# Patient Record
Sex: Female | Born: 1945
Health system: Southern US, Community
[De-identification: ages and names within clinical notes are randomized; demographics above are authoritative.]

## PROBLEM LIST (undated history)

## (undated) DIAGNOSIS — K635 Polyp of colon: Secondary | ICD-10-CM

## (undated) DIAGNOSIS — L0390 Cellulitis, unspecified: Secondary | ICD-10-CM

## (undated) DIAGNOSIS — E785 Hyperlipidemia, unspecified: Secondary | ICD-10-CM

## (undated) DIAGNOSIS — E079 Disorder of thyroid, unspecified: Secondary | ICD-10-CM

## (undated) DIAGNOSIS — E119 Type 2 diabetes mellitus without complications: Secondary | ICD-10-CM

## (undated) DIAGNOSIS — C801 Malignant (primary) neoplasm, unspecified: Secondary | ICD-10-CM

## (undated) DIAGNOSIS — J45909 Unspecified asthma, uncomplicated: Secondary | ICD-10-CM

## (undated) HISTORY — DX: Polyp of colon: K63.5

## (undated) HISTORY — DX: Cellulitis, unspecified: L03.90

## (undated) HISTORY — PX: TOOTH EXTRACTION: SUR596

## (undated) HISTORY — DX: Hyperlipidemia, unspecified: E78.5

## (undated) HISTORY — DX: Morbid (severe) obesity due to excess calories: E66.01

---

## 1988-10-21 DIAGNOSIS — E079 Disorder of thyroid, unspecified: Secondary | ICD-10-CM

## 1988-10-21 HISTORY — DX: Disorder of thyroid, unspecified: E07.9

## 1998-02-20 HISTORY — PX: FRACTURE SURGERY: SHX138

## 2011-02-03 ENCOUNTER — Ambulatory Visit (INDEPENDENT_AMBULATORY_CARE_PROVIDER_SITE_OTHER): Payer: 59

## 2011-02-03 DIAGNOSIS — R05 Cough: Secondary | ICD-10-CM

## 2011-08-27 ENCOUNTER — Ambulatory Visit (INDEPENDENT_AMBULATORY_CARE_PROVIDER_SITE_OTHER): Payer: Medicare Other | Admitting: Emergency Medicine

## 2011-08-27 VITALS — BP 128/76 | HR 94 | Temp 98.7°F | Resp 17 | Ht 64.0 in | Wt 198.0 lb

## 2011-08-27 DIAGNOSIS — J218 Acute bronchiolitis due to other specified organisms: Secondary | ICD-10-CM | POA: Diagnosis not present

## 2011-08-27 DIAGNOSIS — J45901 Unspecified asthma with (acute) exacerbation: Secondary | ICD-10-CM

## 2011-08-27 DIAGNOSIS — J018 Other acute sinusitis: Secondary | ICD-10-CM | POA: Diagnosis not present

## 2011-08-27 MED ORDER — PSEUDOEPHEDRINE-GUAIFENESIN ER 60-600 MG PO TB12: 1.0000 | ORAL_TABLET | Freq: Two times a day (BID) | ORAL | Status: DC

## 2011-08-27 MED ORDER — ALBUTEROL SULFATE HFA 108 (90 BASE) MCG/ACT IN AERS: 2.0000 | INHALATION_SPRAY | RESPIRATORY_TRACT | Status: DC | PRN

## 2011-08-27 MED ORDER — LEVOFLOXACIN 500 MG PO TABS: 500.0000 mg | ORAL_TABLET | Freq: Every day | ORAL | Status: AC

## 2011-08-27 NOTE — Progress Notes (Signed)
  Subjective:    Patient ID: Jessica Farrell, female    DOB: 12-Dec-1945, 66 y.o.   MRN: [id number]  Cough This is a new problem. The current episode started in the past 7 days. The problem has been gradually worsening. The cough is productive of purulent sputum. Associated symptoms include chills, a fever, headaches, myalgias, nasal congestion, postnasal drip, a sore throat and wheezing. Pertinent negatives include no chest pain, ear congestion, ear pain, eye redness, heartburn, hemoptysis, rash, rhinorrhea, shortness of breath, sweats or weight loss. Nothing aggravates the symptoms. She has tried a beta-agonist inhaler for the symptoms. The treatment provided no relief. Her past medical history is significant for asthma. There is no history of bronchiectasis, bronchitis, COPD, emphysema, environmental allergies or pneumonia.  Headache  This is a new problem. The current episode started in the past 7 days. The problem occurs constantly. The problem has been unchanged. The pain is mild. Associated symptoms include coughing, drainage, a fever, sinus pressure, a sore throat and weakness. Pertinent negatives include no abdominal pain, abnormal behavior, anorexia, back pain, blurred vision, dizziness, ear pain, eye pain, eye redness, eye watering, facial sweating, hearing loss, insomnia, loss of balance, muscle aches, nausea, neck pain, numbness, phonophobia, photophobia, rhinorrhea, scalp tenderness, seizures, swollen glands, tingling, tinnitus, visual change, vomiting or weight loss.      Review of Systems  Constitutional: Positive for fever and chills. Negative for weight loss.  HENT: Positive for sore throat, postnasal drip and sinus pressure. Negative for hearing loss, ear pain, rhinorrhea, neck pain and tinnitus.   Eyes: Negative for blurred vision, photophobia, pain and redness.  Respiratory: Positive for cough and wheezing. Negative for hemoptysis and shortness of breath.   Cardiovascular:  Negative for chest pain.  Gastrointestinal: Negative for heartburn, nausea, vomiting, abdominal pain and anorexia.  Genitourinary: Negative.   Musculoskeletal: Positive for myalgias. Negative for back pain.  Skin: Negative for rash.  Neurological: Positive for weakness and headaches. Negative for dizziness, tingling, seizures, numbness and loss of balance.  Hematological: Negative for environmental allergies.  Psychiatric/Behavioral: The patient does not have insomnia.        Objective:   Physical Exam  Constitutional: She is oriented to person, place, and time. She appears well-developed and well-nourished.  HENT:  Head: Normocephalic and atraumatic.  Eyes: Conjunctivae are normal. Pupils are equal, round, and reactive to light.  Neck: Normal range of motion. Neck supple.  Cardiovascular: Normal rate and regular rhythm.   Pulmonary/Chest: Effort normal and breath sounds normal.  Abdominal: Soft.  Musculoskeletal: Normal range of motion.  Neurological: She is oriented to person, place, and time.  Skin: Skin is warm and dry.          Assessment & Plan:  Exacerbation asthma Acute bronchitis Sinusitis Follow up as needed

## 2011-09-04 ENCOUNTER — Other Ambulatory Visit: Payer: Self-pay | Admitting: Emergency Medicine

## 2011-09-05 ENCOUNTER — Telehealth: Payer: Self-pay

## 2011-09-05 NOTE — Telephone Encounter (Signed)
Pt reports that she has improved some since she has taken the Abx, but still has nasal and chest congestion and cough w/yellow mucus/phlegm. Pt reports no fever and states she has a little more energy and cough is not quite as bad, but still using inhaler for wheeze as well. Pt requests another round of Abx to completely get rid of infection. If this needs to wait for Dr Ewell Poe review tomorrow, please let pt know.

## 2011-09-05 NOTE — Telephone Encounter (Signed)
10 d of abx should be enough - but if patient wants Dr Dareen Piano to review please send to him.

## 2011-09-05 NOTE — Telephone Encounter (Signed)
Requesting more antibiotic (needs another round)

## 2011-09-06 NOTE — Telephone Encounter (Signed)
The patient called to request second dose of antibiotic.  The patient is requesting call back asap. Please return call to patient at [phone].

## 2011-09-06 NOTE — Telephone Encounter (Signed)
Just ask patient to come in to be seen if she is that 'bad off'.  No on the antibiotic refill however.   Thanks

## 2011-09-06 NOTE — Telephone Encounter (Signed)
Can you please advise on this

## 2011-09-08 ENCOUNTER — Ambulatory Visit (INDEPENDENT_AMBULATORY_CARE_PROVIDER_SITE_OTHER): Payer: Medicare Other | Admitting: Family Medicine

## 2011-09-08 VITALS — BP 118/76 | HR 81 | Temp 98.5°F | Resp 16 | Ht 64.0 in | Wt 197.8 lb

## 2011-09-08 DIAGNOSIS — R05 Cough: Secondary | ICD-10-CM | POA: Diagnosis not present

## 2011-09-08 MED ORDER — AZITHROMYCIN 250 MG PO TABS: ORAL_TABLET | ORAL | Status: DC

## 2011-09-08 MED ORDER — AZITHROMYCIN 250 MG PO TABS: ORAL_TABLET | ORAL | Status: AC

## 2011-09-08 NOTE — Telephone Encounter (Signed)
The patient called again regarding need for antibiotic.  This writer advised patient that Dr. Dareen Piano stated she needed to be seen to be re-evaluated prior to writing any Rx's for current illness.  Patient stated she would attempt to come in to be seen.

## 2011-09-08 NOTE — Progress Notes (Signed)
[facility] [address] [place] [address] [phone]  Date:  09/08/2011   Name:  Jessica Farrell   DOB:  1945/03/05   MRN:  [id number]  PCP:  No primary provider on file.    Chief Complaint: Follow-up   History of Present Illness:  Jessica Farrell is a 66 y.o. very pleasant female patient who presents with the following:  She was here on 08/27/11 with a cough.  She took levaquin for 10 days.  However, she continues to have a cough an is coughing up some phlegm, and still has a stuffy nose.  She does feel better, but does not feel 100% better yet.  She has not had any fever, no chills or aches.  She did have a pain in her left shoulder blade a couple of days ago- she has been treating with ice and sports cream, and it has gotten better.  It seems MSK in origin- moving her shoulder/ arm induces the pain.   There is no problem list on file for this patient.   No past medical history on file.  No past surgical history on file.  History  Substance Use Topics  . Smoking status: Never Smoker   . Smokeless tobacco: Not on file  . Alcohol Use: Not on file    No family history on file.  Allergies  Allergen Reactions  . Penicillins Rash    Medication list has been reviewed and updated.  Current Outpatient Prescriptions on File Prior to Visit  Medication Sig Dispense Refill  . albuterol (PROVENTIL HFA;VENTOLIN HFA) 108 (90 BASE) MCG/ACT inhaler Inhale 2 puffs into the lungs every 4 (four) hours as needed for wheezing.  1 Inhaler  12  . montelukast (SINGULAIR) 10 MG tablet Take 10 mg by mouth at bedtime.      . pseudoephedrine-guaifenesin (MUCINEX D) 60-600 MG per tablet Take 1 tablet by mouth every 12 (twelve) hours.  18 tablet  0    Review of Systems:  As per HPI- otherwise negative.   Physical Examination: Filed Vitals:   09/08/11 1630  BP: 118/76  Pulse: 81  Temp: 98.5 F (36.9 C)  Resp: 16   Filed Vitals:   09/08/11 1630  Height: 5'  4" (1.626 m)  Weight: 197 lb 12.8 oz (89.721 kg)   Body mass index is 33.95 kg/(m^2). Ideal Body Weight: Weight in (lb) to have BMI = 25: 145.3   GEN: WDWN, NAD, Non-toxic, A & O x 3, overweight  HEENT: Atraumatic, Normocephalic. Neck supple. No masses, No LAD.  Tm and oropharynx wnl.   Ears and Nose: No external deformity. CV: RRR, No M/G/R. No JVD. No thrill. No extra heart sounds. PULM: CTA B,  crackles, rhonchi. No retractions. No resp. distress. No accessory muscle use.  Very minimal wheeze bilaterally ABD: S, NT, ND, +BS. No rebound. No HSM. EXTR: No c/c/e NEURO Normal gait.  PSYCH: Normally interactive. Conversant. Not depressed or anxious appearing.  Calm demeanor.  Mild tenderness along her left shoulder blade.  No redness or lesions.    Assessment and Plan: 1. Cough  azithromycin (ZITHROMAX) 250 MG tablet   Persistent cough after recent illness.  Reassured that cough can last for some time after treatment for bronchitis, and if she is feeling better she should likely just give herself more time to improve. Did give printed rx for a zpack which she can fill if she does not continue to get better.  If she is not  steadily getting better, or if she starts to get worse please call or RTC.   Abbe Amsterdam, MD

## 2011-11-14 ENCOUNTER — Other Ambulatory Visit: Payer: Self-pay | Admitting: Family Medicine

## 2012-04-20 DIAGNOSIS — L0390 Cellulitis, unspecified: Secondary | ICD-10-CM

## 2012-04-20 HISTORY — DX: Cellulitis, unspecified: L03.90

## 2012-05-05 ENCOUNTER — Emergency Department (HOSPITAL_BASED_OUTPATIENT_CLINIC_OR_DEPARTMENT_OTHER): Payer: Medicare Other

## 2012-05-05 ENCOUNTER — Encounter (HOSPITAL_BASED_OUTPATIENT_CLINIC_OR_DEPARTMENT_OTHER): Payer: Self-pay

## 2012-05-05 ENCOUNTER — Inpatient Hospital Stay (HOSPITAL_BASED_OUTPATIENT_CLINIC_OR_DEPARTMENT_OTHER)
Admission: EM | Admit: 2012-05-05 | Discharge: 2012-05-09 | DRG: 603 | Disposition: A | Discharge status: Home Health | Payer: Medicare Other | Attending: Internal Medicine | Admitting: Internal Medicine

## 2012-05-05 DIAGNOSIS — R739 Hyperglycemia, unspecified: Secondary | ICD-10-CM

## 2012-05-05 DIAGNOSIS — E871 Hypo-osmolality and hyponatremia: Secondary | ICD-10-CM | POA: Diagnosis present

## 2012-05-05 DIAGNOSIS — Z8249 Family history of ischemic heart disease and other diseases of the circulatory system: Secondary | ICD-10-CM

## 2012-05-05 DIAGNOSIS — Z9119 Patient's noncompliance with other medical treatment and regimen: Secondary | ICD-10-CM

## 2012-05-05 DIAGNOSIS — E119 Type 2 diabetes mellitus without complications: Secondary | ICD-10-CM

## 2012-05-05 DIAGNOSIS — E785 Hyperlipidemia, unspecified: Secondary | ICD-10-CM | POA: Diagnosis present

## 2012-05-05 DIAGNOSIS — L97309 Non-pressure chronic ulcer of unspecified ankle with unspecified severity: Secondary | ICD-10-CM | POA: Diagnosis present

## 2012-05-05 DIAGNOSIS — E860 Dehydration: Secondary | ICD-10-CM | POA: Diagnosis present

## 2012-05-05 DIAGNOSIS — L02419 Cutaneous abscess of limb, unspecified: Principal | ICD-10-CM | POA: Diagnosis present

## 2012-05-05 DIAGNOSIS — B354 Tinea corporis: Secondary | ICD-10-CM | POA: Diagnosis present

## 2012-05-05 DIAGNOSIS — K625 Hemorrhage of anus and rectum: Secondary | ICD-10-CM | POA: Diagnosis present

## 2012-05-05 DIAGNOSIS — E039 Hypothyroidism, unspecified: Secondary | ICD-10-CM | POA: Diagnosis present

## 2012-05-05 DIAGNOSIS — J45909 Unspecified asthma, uncomplicated: Secondary | ICD-10-CM | POA: Diagnosis present

## 2012-05-05 DIAGNOSIS — B353 Tinea pedis: Secondary | ICD-10-CM | POA: Diagnosis present

## 2012-05-05 DIAGNOSIS — N390 Urinary tract infection, site not specified: Secondary | ICD-10-CM | POA: Diagnosis present

## 2012-05-05 DIAGNOSIS — Z880 Allergy status to penicillin: Secondary | ICD-10-CM

## 2012-05-05 DIAGNOSIS — Z91199 Patient's noncompliance with other medical treatment and regimen due to unspecified reason: Secondary | ICD-10-CM

## 2012-05-05 DIAGNOSIS — L97409 Non-pressure chronic ulcer of unspecified heel and midfoot with unspecified severity: Secondary | ICD-10-CM | POA: Diagnosis present

## 2012-05-05 DIAGNOSIS — L03116 Cellulitis of left lower limb: Secondary | ICD-10-CM | POA: Diagnosis present

## 2012-05-05 DIAGNOSIS — Z23 Encounter for immunization: Secondary | ICD-10-CM

## 2012-05-05 DIAGNOSIS — N179 Acute kidney failure, unspecified: Secondary | ICD-10-CM | POA: Diagnosis present

## 2012-05-05 DIAGNOSIS — E1169 Type 2 diabetes mellitus with other specified complication: Secondary | ICD-10-CM | POA: Diagnosis present

## 2012-05-05 DIAGNOSIS — Z6831 Body mass index (BMI) 31.0-31.9, adult: Secondary | ICD-10-CM

## 2012-05-05 DIAGNOSIS — Z79899 Other long term (current) drug therapy: Secondary | ICD-10-CM

## 2012-05-05 DIAGNOSIS — L0390 Cellulitis, unspecified: Secondary | ICD-10-CM

## 2012-05-05 HISTORY — DX: Disorder of thyroid, unspecified: E07.9

## 2012-05-05 HISTORY — DX: Unspecified asthma, uncomplicated: J45.909

## 2012-05-05 HISTORY — DX: Type 2 diabetes mellitus without complications: E11.9

## 2012-05-05 LAB — URINE MICROSCOPIC-ADD ON

## 2012-05-05 LAB — CBC WITH DIFFERENTIAL/PLATELET
HCT: 39.1 % (ref 36.0–46.0)
Hemoglobin: 13.3 g/dL (ref 12.0–15.0)
Lymphocytes Relative: 8 % — ABNORMAL LOW (ref 12–46)
Lymphs Abs: 1.2 10*3/uL (ref 0.7–4.0)
Monocytes Absolute: 0.6 10*3/uL (ref 0.1–1.0)
Monocytes Relative: 4 % (ref 3–12)
Neutro Abs: 13.2 10*3/uL — ABNORMAL HIGH (ref 1.7–7.7)
WBC: 15 10*3/uL — ABNORMAL HIGH (ref 4.0–10.5)

## 2012-05-05 LAB — BASIC METABOLIC PANEL
BUN: 31 mg/dL — ABNORMAL HIGH (ref 6–23)
CO2: 24 mEq/L (ref 19–32)
Chloride: 91 mEq/L — ABNORMAL LOW (ref 96–112)
Creatinine, Ser: 1.2 mg/dL — ABNORMAL HIGH (ref 0.50–1.10)

## 2012-05-05 LAB — URINALYSIS, ROUTINE W REFLEX MICROSCOPIC
Bilirubin Urine: NEGATIVE
Glucose, UA: >1000 mg/dL — AB
Ketones, ur: NEGATIVE mg/dL
pH: 6 (ref 5.0–8.0)

## 2012-05-05 LAB — CREATININE, SERUM
Creatinine, Ser: 0.85 mg/dL (ref 0.50–1.10)
GFR calc non Af Amer: 70 mL/min — ABNORMAL LOW (ref >90)

## 2012-05-05 LAB — CBC
Hemoglobin: 11.5 g/dL — ABNORMAL LOW (ref 12.0–15.0)
MCH: 30.1 pg (ref 26.0–34.0)
Platelets: 169 10*3/uL (ref 150–400)
RBC: 3.82 MIL/uL — ABNORMAL LOW (ref 3.87–5.11)

## 2012-05-05 LAB — GLUCOSE, CAPILLARY
Glucose-Capillary: 222 mg/dL — ABNORMAL HIGH (ref 70–99)
Glucose-Capillary: 229 mg/dL — ABNORMAL HIGH (ref 70–99)

## 2012-05-05 IMAGING — CR DG CHEST 2V
2 series · 2 of 2 positions shown · non-contrast
Comparison: None.

CLINICAL DATA: Cough.  Short of breath.

CHEST - 2 VIEW

[w chest pa]
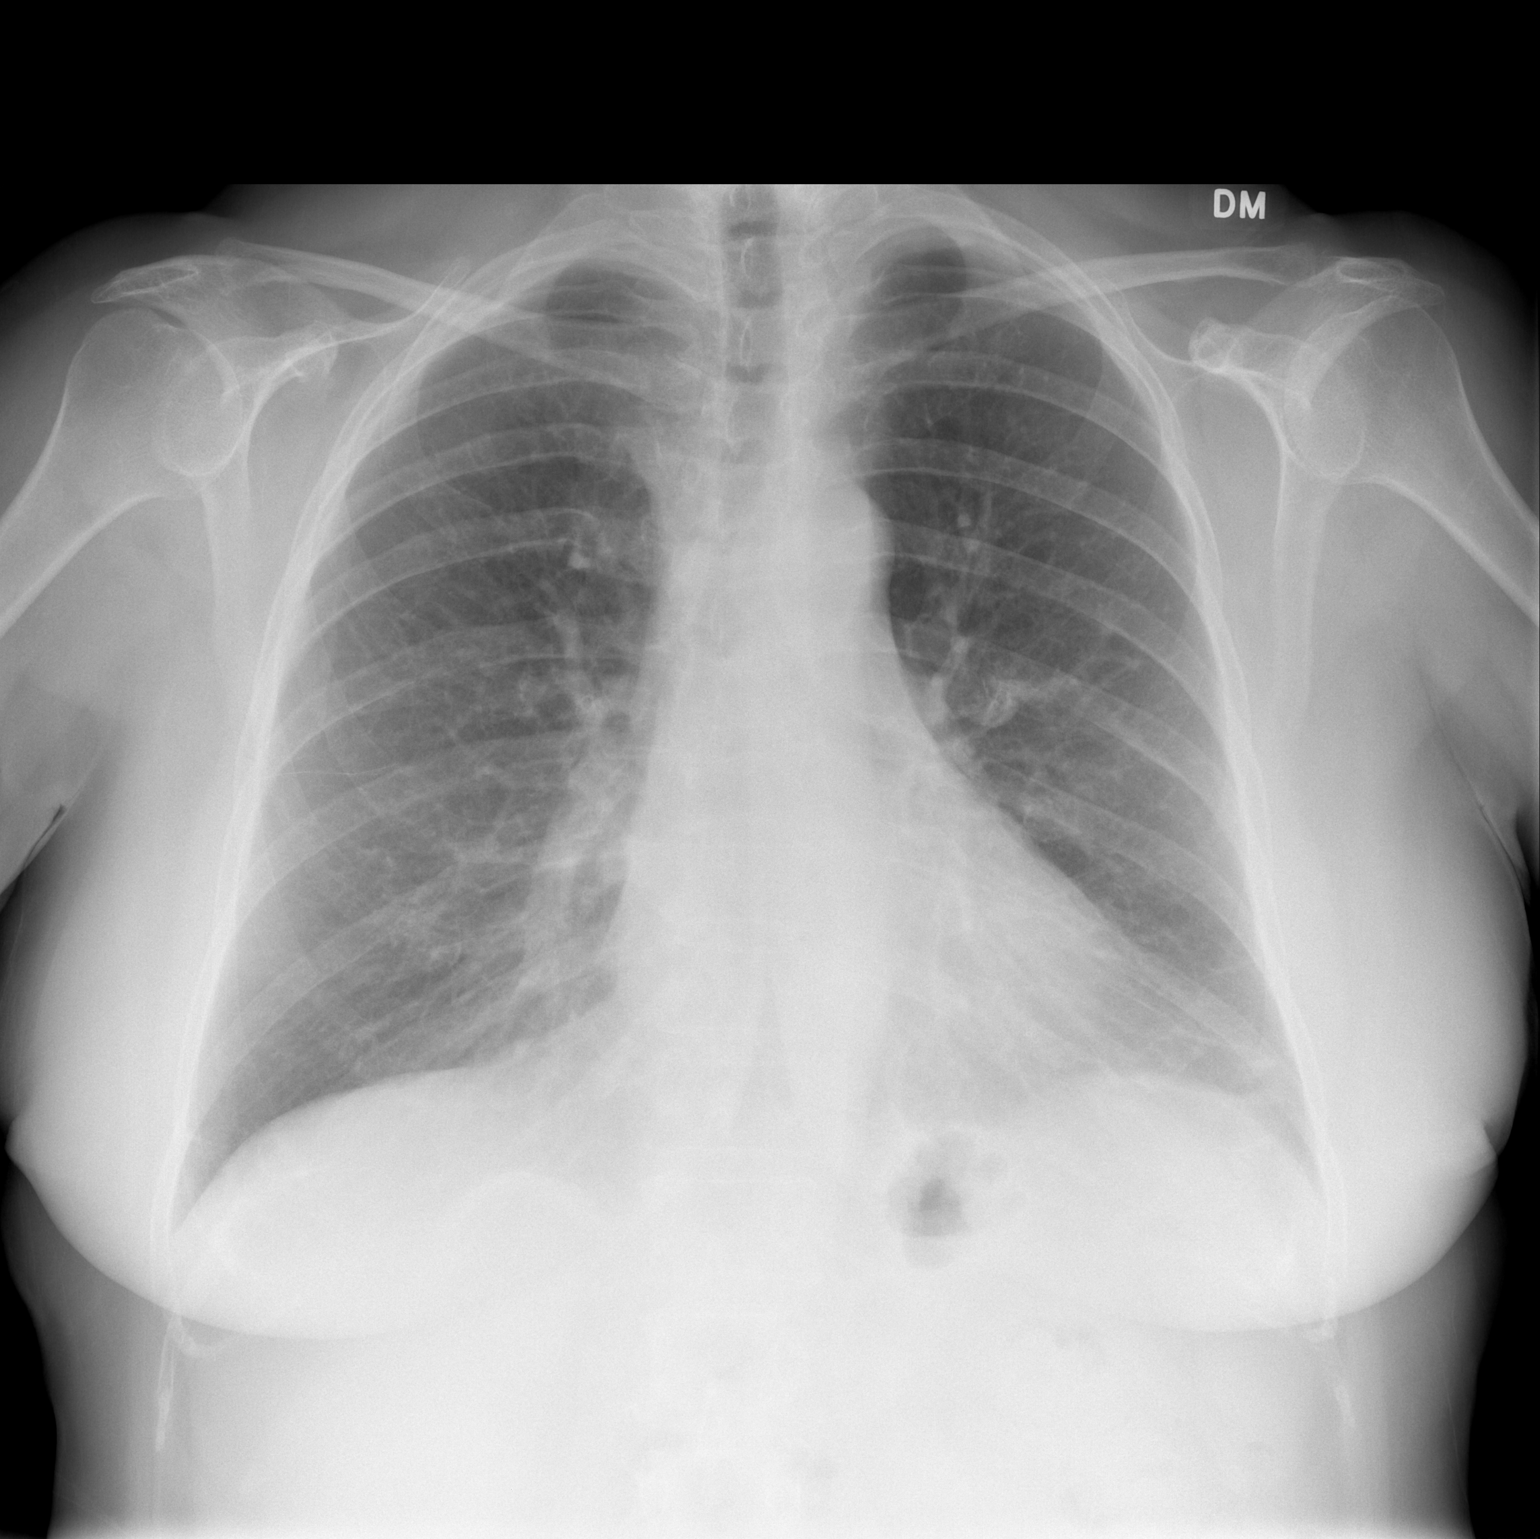

[w chest lat]
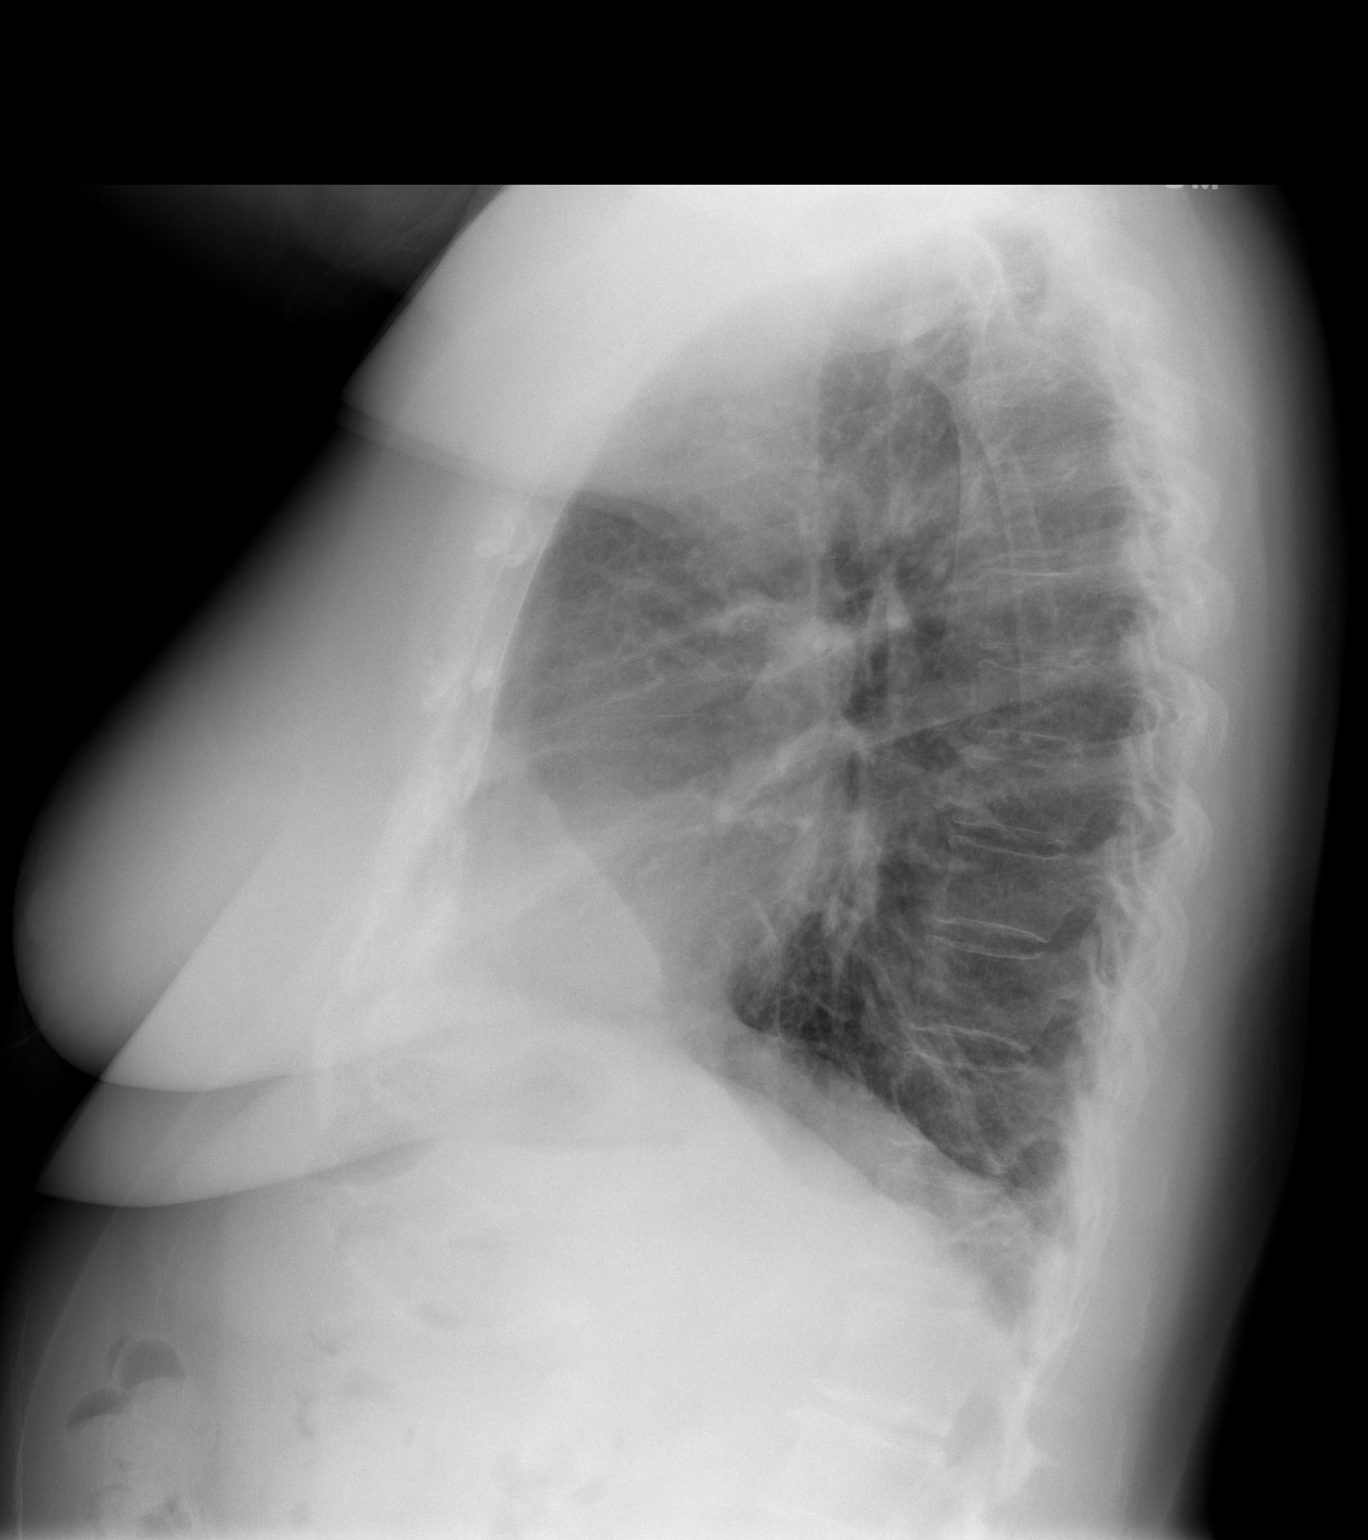

[2 of 2 positions shown; findings below may reference images not displayed]

FINDINGS: Cardiopericardial silhouette upper limits of normal for
projection.    Streaky opacity is present at the lung bases favored
to represent atelectasis over airspace disease/pneumonia.  This
predominately appears in the lower lobes on the lateral view.
There is no pleural effusion.  Subsegmental atelectasis or
scarring.  The left costophrenic angle.
IMPRESSION: Streaky bibasilar opacities favored represent atelectasis rather
than pneumonia or aspiration.

## 2012-05-05 MED ORDER — CLINDAMYCIN PHOSPHATE 600 MG/50ML IV SOLN: 600.0000 mg | Freq: Once | INTRAVENOUS | Status: DC

## 2012-05-05 MED ORDER — INSULIN GLARGINE 100 UNIT/ML ~~LOC~~ SOLN: 10.0000 [IU] | SUBCUTANEOUS | Status: DC

## 2012-05-05 MED ORDER — HYDROMORPHONE HCL PF 1 MG/ML IJ SOLN: 0.5000 mg | INTRAMUSCULAR | Status: DC | PRN

## 2012-05-05 MED ORDER — SODIUM CHLORIDE 0.9 % IV SOLN: 250.0000 mL | INTRAVENOUS | Status: DC | PRN

## 2012-05-05 MED ORDER — ONDANSETRON HCL 4 MG/2ML IJ SOLN
4.0000 mg | Freq: Once | INTRAMUSCULAR | Status: AC
  Administered 2012-05-05: 4 mg via INTRAVENOUS
  Filled 2012-05-05: qty 2

## 2012-05-05 MED ORDER — SODIUM CHLORIDE 0.9 % IV SOLN
INTRAVENOUS | Status: DC
  Administered 2012-05-05: 21:00:00 via INTRAVENOUS
  Administered 2012-05-06: 1000 mL via INTRAVENOUS

## 2012-05-05 MED ORDER — SODIUM CHLORIDE 0.9 % IJ SOLN: 3.0000 mL | INTRAMUSCULAR | Status: DC | PRN

## 2012-05-05 MED ORDER — INSULIN ASPART 100 UNIT/ML ~~LOC~~ SOLN
0.0000 [IU] | Freq: Three times a day (TID) | SUBCUTANEOUS | Status: DC
  Administered 2012-05-06 – 2012-05-07 (×4): 3 [IU] via SUBCUTANEOUS
  Administered 2012-05-07 (×2): 5 [IU] via SUBCUTANEOUS
  Administered 2012-05-08: 2 [IU] via SUBCUTANEOUS
  Administered 2012-05-08: 3 [IU] via SUBCUTANEOUS
  Administered 2012-05-08: 2 [IU] via SUBCUTANEOUS
  Administered 2012-05-09 (×2): 3 [IU] via SUBCUTANEOUS

## 2012-05-05 MED ORDER — SODIUM CHLORIDE 0.9 % IV SOLN: INTRAVENOUS | Status: DC

## 2012-05-05 MED ORDER — MONTELUKAST SODIUM 10 MG PO TABS
10.0000 mg | ORAL_TABLET | Freq: Every day | ORAL | Status: DC
  Administered 2012-05-05 – 2012-05-09 (×5): 10 mg via ORAL
  Filled 2012-05-05 (×5): qty 1

## 2012-05-05 MED ORDER — SODIUM CHLORIDE 0.9 % IV SOLN
500.0000 mg | Freq: Three times a day (TID) | INTRAVENOUS | Status: DC
  Administered 2012-05-06: 500 mg via INTRAVENOUS
  Filled 2012-05-05 (×8): qty 500

## 2012-05-05 MED ORDER — ONDANSETRON HCL 4 MG/2ML IJ SOLN: 4.0000 mg | Freq: Four times a day (QID) | INTRAMUSCULAR | Status: DC | PRN

## 2012-05-05 MED ORDER — ACETAMINOPHEN 325 MG PO TABS
650.0000 mg | ORAL_TABLET | Freq: Four times a day (QID) | ORAL | Status: DC | PRN
  Administered 2012-05-06 (×2): 650 mg via ORAL
  Filled 2012-05-05 (×2): qty 2

## 2012-05-05 MED ORDER — VANCOMYCIN HCL 10 G IV SOLR
1500.0000 mg | INTRAVENOUS | Status: DC
  Administered 2012-05-05: 1500 mg via INTRAVENOUS
  Filled 2012-05-05 (×2): qty 1500

## 2012-05-05 MED ORDER — ONDANSETRON HCL 4 MG PO TABS: 4.0000 mg | ORAL_TABLET | Freq: Four times a day (QID) | ORAL | Status: DC | PRN

## 2012-05-05 MED ORDER — HYDROMORPHONE HCL PF 1 MG/ML IJ SOLN
1.0000 mg | Freq: Once | INTRAMUSCULAR | Status: AC
  Administered 2012-05-05: 1 mg via INTRAVENOUS
  Filled 2012-05-05: qty 1

## 2012-05-05 MED ORDER — MULTI-VITAMIN/MINERALS PO TABS: 1.0000 | ORAL_TABLET | Freq: Every day | ORAL | Status: DC

## 2012-05-05 MED ORDER — SODIUM CHLORIDE 0.9 % IV BOLUS (SEPSIS)
1000.0000 mL | Freq: Once | INTRAVENOUS | Status: AC
  Administered 2012-05-05: 1000 mL via INTRAVENOUS

## 2012-05-05 MED ORDER — OXYCODONE HCL 5 MG PO TABS
5.0000 mg | ORAL_TABLET | ORAL | Status: DC | PRN
  Administered 2012-05-06: 5 mg via ORAL
  Filled 2012-05-05: qty 1

## 2012-05-05 MED ORDER — CEFTRIAXONE SODIUM 1 G IJ SOLR
1.0000 g | Freq: Once | INTRAMUSCULAR | Status: AC
  Administered 2012-05-05: 1 g via INTRAVENOUS
  Filled 2012-05-05: qty 10

## 2012-05-05 MED ORDER — ACETAMINOPHEN 650 MG RE SUPP: 650.0000 mg | Freq: Four times a day (QID) | RECTAL | Status: DC | PRN

## 2012-05-05 MED ORDER — INSULIN ASPART 100 UNIT/ML ~~LOC~~ SOLN
0.0000 [IU] | Freq: Every day | SUBCUTANEOUS | Status: DC
  Administered 2012-05-05 – 2012-05-08 (×4): 2 [IU] via SUBCUTANEOUS

## 2012-05-05 MED ORDER — HEPARIN SODIUM (PORCINE) 5000 UNIT/ML IJ SOLN
5000.0000 [IU] | Freq: Three times a day (TID) | INTRAMUSCULAR | Status: DC
  Administered 2012-05-05 – 2012-05-08 (×10): 5000 [IU] via SUBCUTANEOUS
  Filled 2012-05-05 (×14): qty 1

## 2012-05-05 MED ORDER — ALBUTEROL SULFATE (5 MG/ML) 0.5% IN NEBU: 2.5000 mg | INHALATION_SOLUTION | RESPIRATORY_TRACT | Status: DC | PRN

## 2012-05-05 MED ORDER — CLOTRIMAZOLE 1 % EX CREA
TOPICAL_CREAM | Freq: Two times a day (BID) | CUTANEOUS | Status: DC
  Administered 2012-05-05 – 2012-05-09 (×8): via TOPICAL
  Filled 2012-05-05 (×2): qty 15

## 2012-05-05 MED ORDER — SODIUM CHLORIDE 0.9 % IJ SOLN
3.0000 mL | Freq: Two times a day (BID) | INTRAMUSCULAR | Status: DC
  Administered 2012-05-06 – 2012-05-09 (×4): 3 mL via INTRAVENOUS

## 2012-05-05 MED ORDER — ADULT MULTIVITAMIN W/MINERALS CH
1.0000 | ORAL_TABLET | Freq: Every day | ORAL | Status: DC
  Administered 2012-05-05 – 2012-05-09 (×5): 1 via ORAL
  Filled 2012-05-05 (×5): qty 1

## 2012-05-05 MED ORDER — LORATADINE 10 MG PO TABS
10.0000 mg | ORAL_TABLET | Freq: Every day | ORAL | Status: DC
  Administered 2012-05-05 – 2012-05-09 (×5): 10 mg via ORAL
  Filled 2012-05-05 (×5): qty 1

## 2012-05-05 NOTE — ED Notes (Signed)
Pt states that she noticed leg swelling and redness to the L lower extremity on Friday.  Pt states that redness and swelling has increased significantly since that time.  Denies fever, chills, body aches.  Today had nausea and upset stomach but denies emesis.

## 2012-05-05 NOTE — Progress Notes (Signed)
NURSING PROGRESS NOTE  Jessica Farrell [id number] Admission Data: 05/05/2012 6:26 PM Attending Provider: Lorane Gell, MD PCP:No primary provider on file. Code Status: full  Jessica Farrell is a 67 y.o. female patient admitted from ED:  -No acute distress noted.  -No complaints of shortness of breath.  -No complaints of chest pain.    Blood pressure 103/58, pulse 106, temperature 97.8 F (36.6 C), temperature source Oral, resp. rate 18, height 5\' 5"  (1.651 m), weight 81.647 kg (180 lb), SpO2 97.00%.   IV Fluids:  IV in place, occlusive dsg intact without redness, IV cath antecubital right, condition patent and no redness normal saline.   Allergies:  Penicillins  Past Medical History:   has a past medical history of Asthma and Thyroid disease.  Past Surgical History:   has no past surgical history on file.  Social History:   reports that she has never smoked. She has never used smokeless tobacco. She reports that she does not drink alcohol or use illicit drugs.  Skin: left lower extremity swollen, reddened and warm.  Patient/Family orientated to room. Information packet given to patient/family. Admission inpatient armband information verified with patient/family to include name and date of birth and placed on patient arm. Side rails up x 2, fall assessment and education completed with patient/family. Patient/family able to verbalize understanding of risk associated with falls and verbalized understanding to call for assistance before getting out of bed. Call light within reach. Patient/family able to voice and demonstrate understanding of unit orientation instructions.    Will continue to evaluate and treat per MD orders.

## 2012-05-05 NOTE — ED Provider Notes (Signed)
History     CSN: [id number]  Arrival date & time 05/05/12  1216   First MD Initiated Contact with Patient 05/05/12 1237      Chief Complaint  Patient presents with  . Cellulitis    (Consider location/radiation/quality/duration/timing/severity/associated sxs/prior treatment) HPI Pt with no significant medical problems reports about 4 days ago she noticed pain and redness on her L lower leg which has gotten progressively worse since. She has had chills, but no fever. She has been generally weak, nauseated, no appetite since yesterday. No vomiting or diarrhea. She has been coughing but no SOB. No prior history of skin infections. She is not a known diabetic.   Past Medical History  Diagnosis Date  . Asthma   . Thyroid disease     History reviewed. No pertinent past surgical history.  History reviewed. No pertinent family history.  History  Substance Use Topics  . Smoking status: Never Smoker   . Smokeless tobacco: Never Used  . Alcohol Use: No    OB History   Grav Para Term Preterm Abortions TAB SAB Ect Mult Living                  Review of Systems All other systems reviewed and are negative except as noted in HPI.   Allergies  Penicillins  Home Medications   Current Outpatient Rx  Name  Route  Sig  Dispense  Refill  . albuterol (PROVENTIL HFA;VENTOLIN HFA) 108 (90 BASE) MCG/ACT inhaler   Inhalation   Inhale 2 puffs into the lungs every 4 (four) hours as needed for wheezing.   1 Inhaler   12   . cetirizine (ZYRTEC) 10 MG tablet   Oral   Take 10 mg by mouth daily.         Marland Kitchen GLUCOSAMINE HCL PO   Oral   Take by mouth.         . montelukast (SINGULAIR) 10 MG tablet      TAKE ONE TABLET BY MOUTH EVERY DAY   30 tablet   5   . Multiple Vitamins-Minerals (MULTIVITAMIN WITH MINERALS) tablet   Oral   Take 1 tablet by mouth daily.         . pseudoephedrine-guaifenesin (MUCINEX D) 60-600 MG per tablet   Oral   Take 1 tablet by mouth every 12  (twelve) hours.   18 tablet   0     BP 118/73  Pulse 110  Temp(Src) 97.8 F (36.6 C) (Oral)  Resp 18  Ht 5\' 5"  (1.651 m)  Wt 180 lb (81.647 kg)  BMI 29.95 kg/m2  SpO2 94%  Physical Exam  Nursing note and vitals reviewed. Constitutional: She is oriented to person, place, and time. She appears well-developed and well-nourished.  HENT:  Head: Normocephalic and atraumatic.  Eyes: EOM are normal. Pupils are equal, round, and reactive to light.  Neck: Normal range of motion. Neck supple.  Cardiovascular: Normal rate, normal heart sounds and intact distal pulses.   Pulmonary/Chest: Effort normal. She has wheezes. She has rales.  Abdominal: Bowel sounds are normal. She exhibits no distension. There is no tenderness.  Musculoskeletal: Normal range of motion. She exhibits edema and tenderness.  Marked erythema and induration of L LE from the knee to the ankle with moderate edema  Neurological: She is alert and oriented to person, place, and time. She has normal strength. No cranial nerve deficit or sensory deficit.  Skin: Skin is warm and dry. No rash noted.  Psychiatric: She  has a normal mood and affect.    ED Course  Procedures (including critical care time)  Labs Reviewed  CBC WITH DIFFERENTIAL - Abnormal; Notable for the following:    WBC 15.0 (*)    Neutrophils Relative 88 (*)    Neutro Abs 13.2 (*)    Lymphocytes Relative 8 (*)    All other components within normal limits  BASIC METABOLIC PANEL - Abnormal; Notable for the following:    Sodium 128 (*)    Chloride 91 (*)    Glucose, Bld 352 (*)    BUN 31 (*)    Creatinine, Ser 1.20 (*)    GFR calc non Af Amer 46 (*)    GFR calc Af Amer 53 (*)    All other components within normal limits  URINALYSIS, ROUTINE W REFLEX MICROSCOPIC - Abnormal; Notable for the following:    APPearance CLOUDY (*)    Glucose, UA >1000 (*)    Hgb urine dipstick LARGE (*)    Protein, ur 100 (*)    Nitrite POSITIVE (*)    Leukocytes, UA  SMALL (*)    All other components within normal limits  URINE MICROSCOPIC-ADD ON - Abnormal; Notable for the following:    Squamous Epithelial / LPF FEW (*)    Bacteria, UA MANY (*)    Casts GRANULAR CAST (*)    All other components within normal limits  URINE CULTURE   No results found.   1. Cellulitis   2. Hyperglycemia   3. UTI (urinary tract infection)       MDM  Labs reveal elevated glucose. She appears to be a new diabetic with a significant LE cellulitis. Initially ordered Clinda but UA also shows UTI. Will treat with Rocephin. Discussed with Dr. Lavera Guise who will accept the patient for admission at [facility] for Abx and new onset diabetes.         Charles B. Bernette Mayers, MD 05/05/12 [phone]

## 2012-05-05 NOTE — ED Notes (Signed)
Notified [facility] of bed [id number]--via Gala Romney

## 2012-05-05 NOTE — H&P (Signed)
PCP:   No primary provider on file.   Chief Complaint:  Progressive left leg pain, redness and swelling. Generalized weakness.   HPI: This is a 67 year old female, with known history of bronchial asthma, morbid obesity and hypothyroidism, referred to [facility], from [facility]. A. According to patient, on 05/02/12, she had chills and felt somewhat sweaty. On 05/03/12, she noticed that her left lower leg appeared "pinkish" . There was no antecedent trauma or insect bites. Since then, the affected area has become progressively swollen, painful and red. She has also had subjective fever. She has been generally weak, nauseated and without appetite since 05/04/12. Today, she went to [facility], where she was found to have an elevated blood glucose of 352, sodium of 128, wcc 15.0 and pyuria/bacteriuria. She was referred for admission.    Allergies:   Allergies  Allergen Reactions  . Penicillins Rash      Past Medical History  Diagnosis Date  . Asthma   . Thyroid disease   . Diabetes mellitus 05/05/2012    History reviewed. No pertinent past surgical history.  Prior to Admission medications   Medication Sig Start Date End Date Taking? Authorizing Provider  albuterol (PROVENTIL HFA;VENTOLIN HFA) 108 (90 BASE) MCG/ACT inhaler Inhale 2 puffs into the lungs every 4 (four) hours as needed for wheezing. 08/27/11 08/26/12 Yes Phillips Odor, MD  cetirizine (ZYRTEC) 10 MG tablet Take 10 mg by mouth daily.   Yes Historical Provider, MD  GLUCOSAMINE HCL PO Take by mouth.   Yes Historical Provider, MD  montelukast (SINGULAIR) 10 MG tablet TAKE ONE TABLET BY MOUTH EVERY DAY 11/14/11  Yes Anders Simmonds, PA-C  Multiple Vitamins-Minerals (MULTIVITAMIN WITH MINERALS) tablet Take 1 tablet by mouth daily.   Yes Historical Provider, MD  pseudoephedrine-guaifenesin (MUCINEX D) 60-600 MG per tablet Take 1 tablet by mouth every 12 (twelve) hours. 08/27/11 08/26/12 Yes Phillips Odor, MD    Social History: Patient patient is  divorced about 27 years ago, and has 2 offspring. She reports that she has never smoked. She has never used smokeless tobacco. She reports that she does not drink alcohol or use illicit drugs.  Family History:  Mother died at 69 years, from heart failure. She had CAD, d/p MI, was hypertensive and diabetic. Father died in his 39s, from old age. He had Alzheimer's disease.   Review of Systems:  As per HPI and chief complaint. Patent admits to fatigue, diminished appetite, but no weight loss. She has subjective fever, chills, but no headache, blurred vision, difficulty in speaking, dysphagia, chest pain, cough, shortness of breath, orthopnea, paroxysmal nocturnal dyspnea, abdominal pain, vomiting, diarrhea, belching, heartburn, hematemesis, melena, dysuria, nocturia, urinary frequency, hematochezia. Last bowel movement was a small amount, today. She tends to be chronically constipated. The rest of the systems review is negative.  Physical Exam:  General:  Patient does not appear to be in obvious acute distress. Alert, communicative, fully oriented, talking in complete sentences, not short of breath at rest.  HEENT:  No clinical pallor, no jaundice, no conjunctival injection or discharge. Visible buccal mucosa is "dry".  NECK:  Supple, JVP not seen, no carotid bruits, no palpable lymphadenopathy, no palpable goiter. CHEST:  Clinically clear to auscultation, no wheezes, no crackles. HEART:  Sounds 1 and 2 heard, normal, regular, no murmurs. ABDOMEN:  Morbidly obese soft, non-tender, no palpable organomegaly, no palpable masses, normal bowel sounds. GENITALIA:  Not examined. LOWER EXTREMITIES:  RLE: No pitting edema, palpable peripheral pulses. LLE: Patient has  marked redness, tenderness and swelling of left leg, from just above the ankle, to just below the knee. On there lateral aspects of her left foot, there are blisters/bullae, which are intact. She has bilateral tinea interdigitalis in the feet.   MUSCULOSKELETAL SYSTEM:  Generalized osteoarthritic changes, otherwise, normal. CENTRAL NERVOUS SYSTEM:  No focal neurologic deficit on gross examination.  Labs on Admission:  Results for orders placed during the hospital encounter of 05/05/12 (from the past 48 hour(s))  CBC WITH DIFFERENTIAL     Status: Abnormal   Collection Time    05/05/12  1:05 PM      Result Value Range   WBC 15.0 (*) 4.0 - 10.5 K/uL   RBC 4.40  3.87 - 5.11 MIL/uL   Hemoglobin 13.3  12.0 - 15.0 g/dL   HCT 96.0  45.4 - 09.8 %   MCV 88.9  78.0 - 100.0 fL   MCH 30.2  26.0 - 34.0 pg   MCHC 34.0  30.0 - 36.0 g/dL   RDW 11.9  14.7 - 82.9 %   Platelets 183  150 - 400 K/uL   Neutrophils Relative 88 (*) 43 - 77 %   Neutro Abs 13.2 (*) 1.7 - 7.7 K/uL   Lymphocytes Relative 8 (*) 12 - 46 %   Lymphs Abs 1.2  0.7 - 4.0 K/uL   Monocytes Relative 4  3 - 12 %   Monocytes Absolute 0.6  0.1 - 1.0 K/uL   Eosinophils Relative 0  0 - 5 %   Eosinophils Absolute 0.0  0.0 - 0.7 K/uL   Basophils Relative 0  0 - 1 %   Basophils Absolute 0.0  0.0 - 0.1 K/uL  BASIC METABOLIC PANEL     Status: Abnormal   Collection Time    05/05/12  1:05 PM      Result Value Range   Sodium 128 (*) 135 - 145 mEq/L   Potassium 3.7  3.5 - 5.1 mEq/L   Chloride 91 (*) 96 - 112 mEq/L   CO2 24  19 - 32 mEq/L   Glucose, Bld 352 (*) 70 - 99 mg/dL   BUN 31 (*) 6 - 23 mg/dL   Creatinine, Ser 5.62 (*) 0.50 - 1.10 mg/dL   Calcium 9.2  8.4 - 13.0 mg/dL   GFR calc non Af Amer 46 (*) >90 mL/min   GFR calc Af Amer 53 (*) >90 mL/min   Comment:            The eGFR has been calculated     using the CKD EPI equation.     This calculation has not been     validated in all clinical     situations.     eGFR's persistently     <90 mL/min signify     possible Chronic Kidney Disease.  URINALYSIS, ROUTINE W REFLEX MICROSCOPIC     Status: Abnormal   Collection Time    05/05/12  2:03 PM      Result Value Range   Color, Urine YELLOW  YELLOW   APPearance CLOUDY  (*) CLEAR   Specific Gravity, Urine 1.028  1.005 - 1.030   pH 6.0  5.0 - 8.0   Glucose, UA >1000 (*) NEGATIVE mg/dL   Hgb urine dipstick LARGE (*) NEGATIVE   Bilirubin Urine NEGATIVE  NEGATIVE   Ketones, ur NEGATIVE  NEGATIVE mg/dL   Protein, ur 865 (*) NEGATIVE mg/dL   Urobilinogen, UA 1.0  0.0 - 1.0 mg/dL  Nitrite POSITIVE (*) NEGATIVE   Leukocytes, UA SMALL (*) NEGATIVE  URINE MICROSCOPIC-ADD ON     Status: Abnormal   Collection Time    05/05/12  2:03 PM      Result Value Range   Squamous Epithelial / LPF FEW (*) RARE   WBC, UA TOO NUMEROUS TO COUNT  <3 WBC/hpf   RBC / HPF 7-10  <3 RBC/hpf   Bacteria, UA MANY (*) RARE   Casts GRANULAR CAST (*) NEGATIVE    Radiological Exams on Admission: Dg Chest 2 View  05/05/2012  *RADIOLOGY REPORT*  Clinical Data: Cough.  Short of breath.  CHEST - 2 VIEW  Comparison: None.  Findings: Cardiopericardial silhouette upper limits of normal for projection.    Streaky opacity is present at the lung bases favored to represent atelectasis over airspace disease/pneumonia.  This predominately appears in the lower lobes on the lateral view. There is no pleural effusion.  Subsegmental atelectasis or scarring.  The left costophrenic angle.  IMPRESSION: Streaky bibasilar opacities favored represent atelectasis rather than pneumonia or aspiration.   Original Report Authenticated By: Andreas Newport, M.D.     Assessment/Plan Active Problems:    1. Cellulitis of left lower leg: Patient presented with progressive pain, swelling and redness of LLE, as well as chills, subjective fevers and an elevated wcc of 15.0. Temperature was 100.1. Will manage with iv Vancomycin and Primaxin, and send off blood cultures.  2. Tinea interdigitalis: This is evident between the toes of both feeyt, and is the likely portal of entry, for patient's infective process. Will treat with topical Lotrimin AF.  3. Diabetes mellitus: This is a new diagnosis, based on random blood glucose of  352. Fortunately, patient is not in DKA, neither is she hyperosmolar. Managing with ivi fluid, SSI and Lantus. Check HBA1C. Patient will need diabetic teaching. Lipid profile will be checked.  4. Dehydration/AKI (acute kidney injury): Patient has a creatinine of 1.20, BUN 31, and BP is borderline at 104/71, consistent with dehydration and volume depletion, due to poor oral intake and increased diuresis from hyperglycemia. As described above, will manage with iv fluids.   5. Hyponatremia: This is likely due to pseudohyponatremia from hyperglycemia. Patent however, has a remote history of hypothyroidism, for which she has stooped her medication over 2 decades ago. Managing with NS, correction of glycemia. We shall check TSH 6. UTI (lower urinary tract infection): Confirmed by a positive urinary sediment, with bacteriuria and pyuria. Adequately covered by above antibiotic choice. Awaiting urine culture.  7. Hypothyroidism: Per patient, this was diagnosed about 27 years ago, but she has not been complaint with replacement therapy since then. Will Check TSH.  8. Asthma: This appears to be quiescent. Per patient, she has not had an acute attack for a few years. For prn bronchodilators. Continue pre-admission singulair.   Further management will depend on clinical course.  Comment: patient is FULL CODE.    Time Spent on Admission: 1 hour.   Jj Enyeart,CHRISTOPHER 05/05/2012, 7:12 PM

## 2012-05-05 NOTE — Progress Notes (Signed)
ANTIBIOTIC CONSULT NOTE - INITIAL  Pharmacy Consult for vancomycin, primaxin Indication: cellulitis  Allergies  Allergen Reactions  . Penicillins Rash    Patient Measurements: Height: 5\' 5"  (165.1 cm) Weight: 191 lb 2.2 oz (86.7 kg) IBW/kg (Calculated) : 57   Vital Signs: Temp: 100.1 F (37.8 C) (03/16 1815) Temp src: Oral (03/16 1815) BP: 104/71 mmHg (03/16 1815) Pulse Rate: 101 (03/16 1815) Intake/Output from previous day:   Intake/Output from this shift:    Labs:  Recent Labs  05/05/12 1305  WBC 15.0*  HGB 13.3  PLT 183  CREATININE 1.20*   Estimated Creatinine Clearance: 50.2 ml/min (by C-G formula based on Cr of 1.2). No results found for this basename: VANCOTROUGH, VANCOPEAK, VANCORANDOM, GENTTROUGH, GENTPEAK, GENTRANDOM, TOBRATROUGH, TOBRAPEAK, TOBRARND, AMIKACINPEAK, AMIKACINTROU, AMIKACIN,  in the last 72 hours   Microbiology: No results found for this or any previous visit (from the past 720 hour(s)).  Medical History: Past Medical History  Diagnosis Date  . Asthma   . Thyroid disease   . Diabetes mellitus 05/05/2012    Medications:  Prescriptions prior to admission  Medication Sig Dispense Refill  . albuterol (PROVENTIL HFA;VENTOLIN HFA) 108 (90 BASE) MCG/ACT inhaler Inhale 2 puffs into the lungs every 4 (four) hours as needed for wheezing.      . Aloe Vera GEL Apply 1 application topically daily.      . cetirizine (ZYRTEC) 10 MG tablet Take 10 mg by mouth daily.      Marland Kitchen GLUCOSAMINE HCL PO Take by mouth.      . montelukast (SINGULAIR) 10 MG tablet Take 10 mg by mouth daily.      . Multiple Vitamins-Minerals (MULTIVITAMIN WITH MINERALS) tablet Take 1 tablet by mouth daily.      . pseudoephedrine-guaifenesin (MUCINEX D) 60-600 MG per tablet Take 1 tablet by mouth daily.      . [DISCONTINUED] albuterol (PROVENTIL HFA;VENTOLIN HFA) 108 (90 BASE) MCG/ACT inhaler Inhale 2 puffs into the lungs every 4 (four) hours as needed for wheezing.  1 Inhaler  12    Assessment: 67 yo lady transferred from [facility] for cellulitis and bacteriuria/pyuria to start vancomycin and primaxin.  She received clindamycin at [facility].    Goal of Therapy:  Vancomycin trough level 10-15 mcg/ml Eradication of infection  Plan:  Primaxin 500 mg IV q8 hours. Vancomycin 1500 mg IV q24 hours. F/u cultures, renal function and clinical course.  Talbert Cage Poteet 05/05/2012,7:46 PM

## 2012-05-05 NOTE — ED Notes (Signed)
Via carelink-spoke with Doug 

## 2012-05-06 ENCOUNTER — Encounter (HOSPITAL_COMMUNITY): Payer: Self-pay | Admitting: *Deleted

## 2012-05-06 DIAGNOSIS — E119 Type 2 diabetes mellitus without complications: Secondary | ICD-10-CM

## 2012-05-06 DIAGNOSIS — N179 Acute kidney failure, unspecified: Secondary | ICD-10-CM

## 2012-05-06 DIAGNOSIS — E860 Dehydration: Secondary | ICD-10-CM

## 2012-05-06 DIAGNOSIS — M7989 Other specified soft tissue disorders: Secondary | ICD-10-CM

## 2012-05-06 DIAGNOSIS — L03119 Cellulitis of unspecified part of limb: Principal | ICD-10-CM

## 2012-05-06 LAB — COMPREHENSIVE METABOLIC PANEL
Alkaline Phosphatase: 133 U/L — ABNORMAL HIGH (ref 39–117)
BUN: 14 mg/dL (ref 6–23)
Chloride: 100 mEq/L (ref 96–112)
Creatinine, Ser: 0.76 mg/dL (ref 0.50–1.10)
GFR calc Af Amer: >90 mL/min (ref >90)
Glucose, Bld: 177 mg/dL — ABNORMAL HIGH (ref 70–99)
Potassium: 3.4 mEq/L — ABNORMAL LOW (ref 3.5–5.1)
Total Bilirubin: 0.4 mg/dL (ref 0.3–1.2)
Total Protein: 6.1 g/dL (ref 6.0–8.3)

## 2012-05-06 LAB — CBC
Platelets: 152 10*3/uL (ref 150–400)
RBC: 3.51 MIL/uL — ABNORMAL LOW (ref 3.87–5.11)
RDW: 13.5 % (ref 11.5–15.5)
WBC: 9.6 10*3/uL (ref 4.0–10.5)

## 2012-05-06 LAB — LIPID PANEL: HDL: 14 mg/dL — ABNORMAL LOW (ref >39)

## 2012-05-06 LAB — TSH: TSH: 53.243 u[IU]/mL — ABNORMAL HIGH (ref 0.350–4.500)

## 2012-05-06 LAB — GLUCOSE, CAPILLARY
Glucose-Capillary: 183 mg/dL — ABNORMAL HIGH (ref 70–99)
Glucose-Capillary: 190 mg/dL — ABNORMAL HIGH (ref 70–99)
Glucose-Capillary: 161 mg/dL — ABNORMAL HIGH (ref 70–99)

## 2012-05-06 LAB — HEMOGLOBIN A1C: Hgb A1c MFr Bld: 7.6 % — ABNORMAL HIGH (ref <5.7)

## 2012-05-06 MED ORDER — LIVING WELL WITH DIABETES BOOK
Freq: Once | Status: AC
  Administered 2012-05-06: 18:00:00
  Filled 2012-05-06: qty 1

## 2012-05-06 MED ORDER — SODIUM CHLORIDE 0.9 % IV SOLN
500.0000 mg | Freq: Four times a day (QID) | INTRAVENOUS | Status: DC
  Administered 2012-05-06 – 2012-05-08 (×8): 500 mg via INTRAVENOUS
  Filled 2012-05-06 (×11): qty 500

## 2012-05-06 MED ORDER — PNEUMOCOCCAL VAC POLYVALENT 25 MCG/0.5ML IJ INJ
0.5000 mL | INJECTION | INTRAMUSCULAR | Status: AC
  Administered 2012-05-08: 0.5 mL via INTRAMUSCULAR
  Filled 2012-05-06 (×2): qty 0.5

## 2012-05-06 MED ORDER — LEVOTHYROXINE SODIUM 25 MCG PO TABS
25.0000 ug | ORAL_TABLET | Freq: Every day | ORAL | Status: DC
  Administered 2012-05-06 – 2012-05-09 (×4): 25 ug via ORAL
  Filled 2012-05-06 (×5): qty 1

## 2012-05-06 MED ORDER — VANCOMYCIN HCL IN DEXTROSE 1-5 GM/200ML-% IV SOLN
1000.0000 mg | Freq: Two times a day (BID) | INTRAVENOUS | Status: DC
  Administered 2012-05-06 – 2012-05-09 (×7): 1000 mg via INTRAVENOUS
  Filled 2012-05-06 (×9): qty 200

## 2012-05-06 MED ORDER — INSULIN GLARGINE 100 UNIT/ML ~~LOC~~ SOLN: 10.0000 [IU] | SUBCUTANEOUS | Status: DC

## 2012-05-06 NOTE — Progress Notes (Signed)
Inpatient Diabetes Program Recommendations  AACE/ADA: New Consensus Statement on Inpatient Glycemic Control (2013)  Target Ranges:  Prepandial:   less than 140 mg/dL      Peak postprandial:   less than 180 mg/dL (1-2 hours)      Critically ill patients:  140 - 180 mg/dL   Reason for Visit: Consult received.  Patient has "New Onset" diabetes.  She does not have PCP but wants to see Dr. Wylene Simmer.  Ordered Diabetes videos, "Living Well with Diabetes" booklet, and nutrition consult.  Awaiting results of A1C.  CBG's much improved this morning.  Discussed basics of diabetes with patient.  Instructed her on how to pull up diabetes videos.  A1C will hopefully guide outpatient diabetes regimen.

## 2012-05-06 NOTE — Progress Notes (Signed)
PATIENT DETAILS Name: Jessica Farrell Age: 67 y.o. Sex: female Date of Birth: [birth year] Admit Date: 05/05/2012 Admitting Physician Laveda Norman, MD PCP:No primary provider on file.  Subjective: Claims erythema and swelling of her left leg is significantly better  Assessment/Plan: Active Problems:   Cellulitis of left lower leg - Some clinical improvement per patient- area seems to be receding from the marked line - Low-grade fever overnight, leukocytosis has resolved - Continue with empiric antibiotics-day 2 of vancomycin and Primaxin - We'll get a Doppler ultrasound of left lower extremity - Elevate left leg - Monitor clinical course-will follow closely - Blood cultures on 3/16-pending  Urinary tract infection - Continue with Primaxin - Will await urine culture drawn on 3/16  Mild acute renal insufficiency - Likely prerenal - Decrease IV fluids- as this has resolved  Hyponatremia  - Likely secondary to dehydration and possibly hypothyroidism - Sodium significantly better, monitor periodically    Diabetes mellitus - CBG is significantly elevated - A1c is currently pending - Continue with Lantus and SSI for now-depending on A1c readings will decide what long-term antiplatelet agent to discharge patient on  Hypothyroidism - TSH is 53.2- apparently patient has been noncompliant with thyroid supplementation therapy  - Start levothyroxine- will start at 25 mcg - Recheck TSH in 3 months - Check free T4  Dyslipidemia - Given diabetes, would like to keep LDL less than 100. - Will start statins prior to discharge  Tinea pedis - Continue with clotrimazole cream  Bronchial asthma - Lungs clear - As needed nebulized bronchodilators  Disposition: Remain inpatient  DVT Prophylaxis: Prophylactic  Heparin  Code Status: Full code   Procedures:  None  CONSULTS:  None  PHYSICAL EXAM: Vital signs in last 24 hours: Filed Vitals:   05/05/12 1654 05/05/12 1815  05/05/12 2155 05/06/12 0545  BP:  104/71 104/67 102/66  Pulse:  101 97 92  Temp:  100.1 F (37.8 C) 99 F (37.2 C) 98.1 F (36.7 C)  TempSrc:  Oral Oral Oral  Resp:  18 20 18   Height:  5\' 5"  (1.651 m)    Weight:  86.7 kg (191 lb 2.2 oz)    SpO2: 97% 97% 97% 96%    Weight change:  Body mass index is 31.81 kg/(m^2).   Gen Exam: Awake and alert with clear speech.   Neck: Supple, No JVD.   Chest: B/L Clear.   CVS: S1 S2 Regular, no murmurs.  Abdomen: soft, BS +, non tender, non distended.  Extremities: no edema, lower extremities warm to touch. Left lower extremity-diffuse circumferential erythema present-leg is not tense, sensation is intact. There is significantly decreased-erythema in the foot area-area has been demarcated on admission Neurologic: Non Focal.  Skin: No Rash.   Wounds: N/A.    Intake/Output from previous day:  Intake/Output Summary (Last 24 hours) at 05/06/12 1050 Last data filed at 05/06/12 0900  Gross per 24 hour  Intake   2135 ml  Output    700 ml  Net   1435 ml     LAB RESULTS: CBC  Recent Labs Lab 05/05/12 1305 05/05/12 2032 05/06/12 0619  WBC 15.0* 14.6* 9.6  HGB 13.3 11.5* 10.4*  HCT 39.1 33.3* 30.1*  PLT 183 169 152  MCV 88.9 87.2 85.8  MCH 30.2 30.1 29.6  MCHC 34.0 34.5 34.6  RDW 13.7 13.3 13.5  LYMPHSABS 1.2  --   --   MONOABS 0.6  --   --   EOSABS 0.0  --   --  BASOSABS 0.0  --   --     Chemistries   Recent Labs Lab 05/05/12 1305 05/05/12 2032 05/06/12 0619  NA 128*  --  133*  K 3.7  --  3.4*  CL 91*  --  100  CO2 24  --  23  GLUCOSE 352*  --  177*  BUN 31*  --  14  CREATININE 1.20* 0.85 0.76  CALCIUM 9.2  --  8.4    CBG:  Recent Labs Lab 05/05/12 1920 05/05/12 2151 05/06/12 0741  GLUCAP 222* 229* 183*    GFR Estimated Creatinine Clearance: 75.2 ml/min (by C-G formula based on Cr of 0.76).  Coagulation profile No results found for this basename: INR, PROTIME,  in the last 168 hours  Cardiac  Enzymes No results found for this basename: CK, CKMB, TROPONINI, MYOGLOBIN,  in the last 168 hours  No components found with this basename: POCBNP,  No results found for this basename: DDIMER,  in the last 72 hours No results found for this basename: HGBA1C,  in the last 72 hours  Recent Labs  05/06/12 0619  CHOL 191  HDL 14*  LDLCALC 126*  TRIG 253*  CHOLHDL 13.6   No results found for this basename: TSH, T4TOTAL, FREET3, T3FREE, THYROIDAB,  in the last 72 hours No results found for this basename: VITAMINB12, FOLATE, FERRITIN, TIBC, IRON, RETICCTPCT,  in the last 72 hours No results found for this basename: LIPASE, AMYLASE,  in the last 72 hours  Urine Studies No results found for this basename: UACOL, UAPR, USPG, UPH, UTP, UGL, UKET, UBIL, UHGB, UNIT, UROB, ULEU, UEPI, UWBC, URBC, UBAC, CAST, CRYS, UCOM, BILUA,  in the last 72 hours  MICROBIOLOGY: No results found for this or any previous visit (from the past 240 hour(s)).  RADIOLOGY STUDIES/RESULTS: Dg Chest 2 View  05/05/2012  *RADIOLOGY REPORT*  Clinical Data: Cough.  Short of breath.  CHEST - 2 VIEW  Comparison: None.  Findings: Cardiopericardial silhouette upper limits of normal for projection.    Streaky opacity is present at the lung bases favored to represent atelectasis over airspace disease/pneumonia.  This predominately appears in the lower lobes on the lateral view. There is no pleural effusion.  Subsegmental atelectasis or scarring.  The left costophrenic angle.  IMPRESSION: Streaky bibasilar opacities favored represent atelectasis rather than pneumonia or aspiration.   Original Report Authenticated By: Andreas Newport, M.D.     MEDICATIONS: Scheduled Meds: . clotrimazole   Topical BID  . heparin  5,000 Units Subcutaneous Q8H  . imipenem-cilastatin  500 mg Intravenous Q6H  . insulin aspart  0-15 Units Subcutaneous TID WC  . insulin aspart  0-5 Units Subcutaneous QHS  . insulin glargine  10 Units Subcutaneous [id number]   . loratadine  10 mg Oral Daily  . montelukast  10 mg Oral Daily  . multivitamin with minerals  1 tablet Oral Daily  . [START ON 05/07/2012] pneumococcal 23 valent vaccine  0.5 mL Intramuscular Tomorrow-1000  . sodium chloride  3 mL Intravenous Q12H  . vancomycin  1,000 mg Intravenous Q12H   Continuous Infusions: . sodium chloride 100 mL/hr at 05/05/12 2039   PRN Meds:.sodium chloride, acetaminophen, acetaminophen, albuterol, HYDROmorphone (DILAUDID) injection, ondansetron (ZOFRAN) IV, ondansetron, oxyCODONE, sodium chloride  Antibiotics: Anti-infectives   Start     Dose/Rate Route Frequency Ordered Stop   05/06/12 1000  imipenem-cilastatin (PRIMAXIN) 500 mg in sodium chloride 0.9 % 100 mL IVPB     500 mg 200 mL/hr over 30  Minutes Intravenous Every 6 hours 05/06/12 0822     05/06/12 0900  vancomycin (VANCOCIN) IVPB 1000 mg/200 mL premix     1,000 mg 200 mL/hr over 60 Minutes Intravenous Every 12 hours 05/06/12 0822     05/05/12 2000  imipenem-cilastatin (PRIMAXIN) 500 mg in sodium chloride 0.9 % 100 mL IVPB  Status:  Discontinued     500 mg 200 mL/hr over 30 Minutes Intravenous Every 8 hours 05/05/12 1955 05/06/12 0822   05/05/12 2000  vancomycin (VANCOCIN) 1,500 mg in sodium chloride 0.9 % 500 mL IVPB  Status:  Discontinued     1,500 mg 250 mL/hr over 120 Minutes Intravenous Every 24 hours 05/05/12 1955 05/06/12 0822   05/05/12 1445  cefTRIAXone (ROCEPHIN) 1 g in dextrose 5 % 50 mL IVPB     1 g 100 mL/hr over 30 Minutes Intravenous  Once 05/05/12 1438 05/05/12 1706   05/05/12 1415  clindamycin (CLEOCIN) IVPB 600 mg  Status:  Discontinued     600 mg 100 mL/hr over 30 Minutes Intravenous  Once 05/05/12 1411 05/05/12 1438       Jeoffrey Massed, MD  [facility] Pager:[phone]  If 7PM-7AM, please contact night-coverage [email or web address] Password [email or web address] 05/06/2012, 10:50 AM   LOS: 1 day

## 2012-05-06 NOTE — Progress Notes (Addendum)
Sterling Big (the patient's daughter) is concerned about her mother receiving Lantus before her A1C result has come back.  The patient has given permission for caregivers to talk to daughter about patient's condition.  Also, note the patient is AOx4. The daughter requests a phone call at [phone] when the A1C results come back, as well as, lantus being held until she knows the A1C result. Will continue to monitor.

## 2012-05-06 NOTE — Plan of Care (Signed)
Problem: Food- and Nutrition-Related Knowledge Deficit (NB-1.1) Goal: Nutrition education Formal process to instruct or train a patient/client in a skill or to impart knowledge to help patients/clients voluntarily manage or modify food choices and eating behavior to maintain or improve health. Outcome: Completed/Met Date Met:  05/06/12 Nutrition Education Note:   RD consulted for nutrition education regarding diabetes.     No results found for this basename: HGBA1C   CBG (last 3)   Recent Labs   05/05/12 2151 05/06/12 0741 05/06/12 1155  GLUCAP 229* 183* 190*      RD provided "Carbohydrate Counting for People with Diabetes" handout from the Academy of Nutrition and Dietetics. Discussed different food groups and their effects on blood sugar, emphasizing carbohydrate-containing foods. Provided list of carbohydrates and recommended serving sizes of common foods.  Discussed importance of controlled and consistent carbohydrate intake throughout the day. Provided examples of ways to balance meals/snacks and encouraged intake of high-fiber, whole grain complex carbohydrates. Teach back method used.  Expect fair compliance.  Body mass index is 31.81 kg/(m^2). Pt meets criteria for obesity class 1 based on current BMI.  Current diet order is Carb Mod medium, patient is consuming approximately 100% of meals at this time. Labs and medications reviewed. No further nutrition interventions warranted at this time. RD contact information provided. If additional nutrition issues arise, please re-consult RD.  Clarene Duke RD, LDN Pager [phone] After Hours pager [phone]

## 2012-05-06 NOTE — Progress Notes (Signed)
Pt daughter Jasmine December) called RN to inquire pt's A1C results. 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

## 2012-05-06 NOTE — Progress Notes (Signed)
*  Preliminary Results* Left lower extremity venous duplex completed. Left lower extremity is negative for deep vein thrombosis. No evidence of left Baker's cyst.  05/06/2012 3:49 PM Gertie Fey, RDMS, RDCS

## 2012-05-06 NOTE — Progress Notes (Signed)
ANTIBIOTIC CONSULT NOTE - FOLLOW UP  Pharmacy Consult:  Vancomycin / Primaxin Indication:  LLL cellulitis  Allergies  Allergen Reactions  . Penicillins Rash    Patient Measurements: Height: 5\' 5"  (165.1 cm) Weight: 191 lb 2.2 oz (86.7 kg) IBW/kg (Calculated) : 57  Vital Signs: Temp: 98.1 F (36.7 C) (03/17 0545) Temp src: Oral (03/17 0545) BP: 102/66 mmHg (03/17 0545) Pulse Rate: 92 (03/17 0545) Intake/Output from previous day: 03/16 0701 - 03/17 0700 In: 1775 [P.O.:240; I.V.:935; IV Piggyback:600] Out: 700 [Urine:700]  Labs:  Recent Labs  05/05/12 1305 05/05/12 2032 05/06/12 0619  WBC 15.0* 14.6* 9.6  HGB 13.3 11.5* 10.4*  PLT 183 169 152  CREATININE 1.20* 0.85 0.76   Estimated Creatinine Clearance: 75.2 ml/min (by C-G formula based on Cr of 0.76). No results found for this basename: VANCOTROUGH, VANCOPEAK, VANCORANDOM, GENTTROUGH, GENTPEAK, GENTRANDOM, TOBRATROUGH, TOBRAPEAK, TOBRARND, AMIKACINPEAK, AMIKACINTROU, AMIKACIN,  in the last 72 hours   Microbiology: No results found for this or any previous visit (from the past 720 hour(s)).     Assessment: 60 YOM transferred from [facility] for cellulitis and bacteriuria/pyuria and started on vancomycin and primaxin.  Her renal function is improving.  Vanc 3/16 >> Primaxin 3/16 >>  3/16 blood cx - collected 3/16 urine cx - collected    Goal of Therapy:  Vanc trough 10 - 15 mcg/mL   Plan:  - Change vanc to 1gm IV Q12H - Change Primaxin to 500mg  IV Q6H 0900 - F/u cultures, renal function and clinical course - F/U start statin or fibrate     Tannya Gonet D. Laney Potash, PharmD, BCPS Pager:  [phone] 05/06/2012, 8:22 AM

## 2012-05-07 DIAGNOSIS — N390 Urinary tract infection, site not specified: Secondary | ICD-10-CM

## 2012-05-07 DIAGNOSIS — L0291 Cutaneous abscess, unspecified: Secondary | ICD-10-CM

## 2012-05-07 LAB — COMPREHENSIVE METABOLIC PANEL
ALT: 37 U/L — ABNORMAL HIGH (ref 0–35)
AST: 20 U/L (ref 0–37)
Albumin: 2.2 g/dL — ABNORMAL LOW (ref 3.5–5.2)
Alkaline Phosphatase: 119 U/L — ABNORMAL HIGH (ref 39–117)
Chloride: 101 mEq/L (ref 96–112)
Potassium: 3.8 mEq/L (ref 3.5–5.1)
Sodium: 136 mEq/L (ref 135–145)
Total Bilirubin: 0.3 mg/dL (ref 0.3–1.2)
Total Protein: 5.8 g/dL — ABNORMAL LOW (ref 6.0–8.3)

## 2012-05-07 LAB — CBC
HCT: 29.1 % — ABNORMAL LOW (ref 36.0–46.0)
MCHC: 34.4 g/dL (ref 30.0–36.0)
Platelets: 170 10*3/uL (ref 150–400)
RDW: 13.6 % (ref 11.5–15.5)
WBC: 9.9 10*3/uL (ref 4.0–10.5)

## 2012-05-07 LAB — GLUCOSE, CAPILLARY
Glucose-Capillary: 169 mg/dL — ABNORMAL HIGH (ref 70–99)
Glucose-Capillary: 202 mg/dL — ABNORMAL HIGH (ref 70–99)
Glucose-Capillary: 218 mg/dL — ABNORMAL HIGH (ref 70–99)

## 2012-05-07 LAB — URINE CULTURE

## 2012-05-07 LAB — T4, FREE: Free T4: 0.65 ng/dL — ABNORMAL LOW (ref 0.80–1.80)

## 2012-05-07 MED ORDER — INFLUENZA VIRUS VACC SPLIT PF IM SUSP
0.5000 mL | INTRAMUSCULAR | Status: AC
  Administered 2012-05-08: 0.5 mL via INTRAMUSCULAR
  Filled 2012-05-07: qty 0.5

## 2012-05-07 NOTE — Care Management Note (Addendum)
    Page 1 of 2   05/09/2012     12:19:29 PM   CARE MANAGEMENT NOTE 05/09/2012  Patient:  Jessica Farrell, Jessica Farrell   Account Number:  [id number]  Date Initiated:  05/07/2012  Documentation initiated by:  Letha Cape  Subjective/Objective Assessment:   dx cellulitis  admit- lives alone. pta indep.     Action/Plan:   HHRN for wound care   Anticipated DC Date:  05/09/2012   Anticipated DC Plan:  HOME W HOME HEALTH SERVICES      DC Planning Services  CM consult  Follow-up appt scheduled      [facility] Choice  HOME HEALTH   Choice offered to / List presented to:  C-1 Patient        HH arranged  HH-1 RN      [facility] agency  [facility]   Status of service:  Completed, signed off Medicare Important Message given?   (If response is "NO", the following Medicare IM given date fields will be blank) Date Medicare IM given:   Date Additional Medicare IM given:    Discharge Disposition:  HOME W HOME HEALTH SERVICES  Per UR Regulation:  Reviewed for med. necessity/level of care/duration of stay  If discussed at Long Length of Stay Meetings, dates discussed:    Comments:  05/09/12 12:17 Letha Cape RN, BSN [phone] patient is for discharge today, she needs wound care, she chose [facility] from agency list, referral made to [facility], Lupita Leash notified for [facility].  Soc will begin 24-48 hrs post discharge.  05/07/12 11:29 Letha Cape RN, BSN [phone] patient lives alone, pta indep. Patient does not have a PCP and would like to be set up with Dr. Wylene Simmer office,  I left a message for his office on 3/17 and received a call back today from Duncan, who makes new patient referral appts.  Patient is scheduled for 3/15 at 2:30 for a 3pm appt.  She will need to go on the portal through her email to register.  NCM will continue to follow for dc needs.

## 2012-05-07 NOTE — Progress Notes (Signed)
PATIENT DETAILS Name: Jessica Farrell Age: 67 y.o. Sex: female Date of Birth: 12/02/1945 Admit Date: 05/05/2012 Admitting Physician Laveda Norman, MD PCP:No primary provider on file.  Subjective: Improvement in her left leg erythema, decreased swelling and significantly decreased pain.  Assessment/Plan: Active Problems:   Cellulitis of left lower leg - Significant clinical improvement  - Afebrile overnight, leukocytosis has resolved - Continue with empiric antibiotics-day 3 of vancomycin and Primaxin - Doppler ultrasound of left lower extremity-neg for DVT - Elevate left leg - Monitor clinical course-will follow closely - Blood cultures on 3/16-negative  Urinary tract infection - Continue with Primaxin - Will await urine culture drawn on 3/16-prelim-showing gram neg rods  Mild acute renal insufficiency - Likely prerenal - Decrease IV fluids- as this has resolved  Hyponatremia  - Likely secondary to dehydration and possibly hypothyroidism - Sodium significantly better, monitor periodically    Diabetes mellitus - CBG is significantly elevated - A1c 7.6 - Continue with  SSI for now-will discharge on Metformin -Diabetic Education  Hypothyroidism - TSH is 53.2- apparently patient has been noncompliant with thyroid supplementation therapy  - Start edlevothyroxine- will start at 25 mcg - Recheck TSH in 3 months - Check free T4  Dyslipidemia - Given diabetes, would like to keep LDL less than 100. - Will start statins prior to discharge  Tinea pedis - Continue with clotrimazole cream  Bronchial asthma - Lungs clear - As needed nebulized bronchodilators  Disposition: Remain inpatient-Home in next 1-2 days  DVT Prophylaxis: Prophylactic  Heparin  Code Status: Full code   Procedures:  None  CONSULTS:  None  PHYSICAL EXAM: Vital signs in last 24 hours: Filed Vitals:   05/06/12 0545 05/06/12 1341 05/06/12 2100 05/07/12 0522  BP: 102/66 93/58 114/96 101/69   Pulse: 92 86 114 82  Temp: 98.1 F (36.7 C) 97.8 F (36.6 C) 98.4 F (36.9 C) 98.3 F (36.8 C)  TempSrc: Oral Oral Oral Oral  Resp: 18 18 19 18   Height:      Weight:      SpO2: 96% 96% 96% 96%    Weight change:  Body mass index is 31.81 kg/(m^2).   Gen Exam: Awake and alert with clear speech.   Neck: Supple, No JVD.   Chest: B/L Clear.   CVS: S1 S2 Regular, no murmurs.  Abdomen: soft, BS +, non tender, non distended.  Extremities: no edema, lower extremities warm to touch. Left lower extremity decrease in diffuse circumferential erythema present-leg is not tense, sensation is intact. There is significantly decreased-erythema in the foot area-area has been demarcated on admission Neurologic: Non Focal.  Skin: No Rash.   Wounds: N/A.    Intake/Output from previous day:  Intake/Output Summary (Last 24 hours) at 05/07/12 1029 Last data filed at 05/07/12 0900  Gross per 24 hour  Intake 1376.67 ml  Output    550 ml  Net 826.67 ml     LAB RESULTS: CBC  Recent Labs Lab 05/05/12 1305 05/05/12 2032 05/06/12 0619 05/07/12 0530  WBC 15.0* 14.6* 9.6 9.9  HGB 13.3 11.5* 10.4* 10.0*  HCT 39.1 33.3* 30.1* 29.1*  PLT 183 169 152 170  MCV 88.9 87.2 85.8 87.9  MCH 30.2 30.1 29.6 30.2  MCHC 34.0 34.5 34.6 34.4  RDW 13.7 13.3 13.5 13.6  LYMPHSABS 1.2  --   --   --   MONOABS 0.6  --   --   --   EOSABS 0.0  --   --   --  BASOSABS 0.0  --   --   --     Chemistries   Recent Labs Lab 05/05/12 1305 05/05/12 2032 05/06/12 0619 05/07/12 0530  NA 128*  --  133* 136  K 3.7  --  3.4* 3.8  CL 91*  --  100 101  CO2 24  --  23 27  GLUCOSE 352*  --  177* 196*  BUN 31*  --  14 10  CREATININE 1.20* 0.85 0.76 0.74  CALCIUM 9.2  --  8.4 8.2*    CBG:  Recent Labs Lab 05/06/12 0741 05/06/12 1155 05/06/12 1731 05/06/12 2135 05/07/12 0758  GLUCAP 183* 190* 161* 219* 169*    GFR Estimated Creatinine Clearance: 75.2 ml/min (by C-G formula based on Cr of  0.74).  Coagulation profile No results found for this basename: INR, PROTIME,  in the last 168 hours  Cardiac Enzymes No results found for this basename: CK, CKMB, TROPONINI, MYOGLOBIN,  in the last 168 hours  No components found with this basename: POCBNP,  No results found for this basename: DDIMER,  in the last 72 hours  Recent Labs  05/05/12 2032  HGBA1C 7.6*    Recent Labs  05/06/12 0619  CHOL 191  HDL 14*  LDLCALC 126*  TRIG 253*  CHOLHDL 13.6    Recent Labs  05/05/12 2032  TSH 53.243*   No results found for this basename: VITAMINB12, FOLATE, FERRITIN, TIBC, IRON, RETICCTPCT,  in the last 72 hours No results found for this basename: LIPASE, AMYLASE,  in the last 72 hours  Urine Studies No results found for this basename: UACOL, UAPR, USPG, UPH, UTP, UGL, UKET, UBIL, UHGB, UNIT, UROB, ULEU, UEPI, UWBC, URBC, UBAC, CAST, CRYS, UCOM, BILUA,  in the last 72 hours  MICROBIOLOGY: Recent Results (from the past 240 hour(s))  URINE CULTURE     Status: None   Collection Time    05/05/12  2:03 PM      Result Value Range Status   Specimen Description URINE, CLEAN CATCH   Final   Special Requests NONE   Final   Culture  Setup Time 05/06/2012 08:59   Final   Colony Count >=100,000 COLONIES/ML   Final   Culture GRAM NEGATIVE RODS   Final   Report Status PENDING   Incomplete  CULTURE, BLOOD (ROUTINE X 2)     Status: None   Collection Time    05/05/12  8:33 PM      Result Value Range Status   Specimen Description BLOOD LEFT ARM   Final   Special Requests BOTTLES DRAWN AEROBIC AND ANAEROBIC 10CC EACH   Final   Culture  Setup Time 05/06/2012 06:42   Final   Culture     Final   Value:        BLOOD CULTURE RECEIVED NO GROWTH TO DATE CULTURE WILL BE HELD FOR 5 DAYS BEFORE ISSUING A FINAL NEGATIVE REPORT   Report Status PENDING   Incomplete  CULTURE, BLOOD (ROUTINE X 2)     Status: None   Collection Time    05/05/12  8:40 PM      Result Value Range Status   Specimen  Description BLOOD RIGHT HAND   Final   Special Requests BOTTLES DRAWN AEROBIC AND ANAEROBIC 10CC EACH   Final   Culture  Setup Time 05/06/2012 06:42   Final   Culture     Final   Value:        BLOOD CULTURE RECEIVED NO GROWTH  TO DATE CULTURE WILL BE HELD FOR 5 DAYS BEFORE ISSUING A FINAL NEGATIVE REPORT   Report Status PENDING   Incomplete    RADIOLOGY STUDIES/RESULTS: Dg Chest 2 View  05/05/2012  *RADIOLOGY REPORT*  Clinical Data: Cough.  Short of breath.  CHEST - 2 VIEW  Comparison: None.  Findings: Cardiopericardial silhouette upper limits of normal for projection.    Streaky opacity is present at the lung bases favored to represent atelectasis over airspace disease/pneumonia.  This predominately appears in the lower lobes on the lateral view. There is no pleural effusion.  Subsegmental atelectasis or scarring.  The left costophrenic angle.  IMPRESSION: Streaky bibasilar opacities favored represent atelectasis rather than pneumonia or aspiration.   Original Report Authenticated By: Andreas Newport, M.D.     MEDICATIONS: Scheduled Meds: . clotrimazole   Topical BID  . heparin  5,000 Units Subcutaneous Q8H  . imipenem-cilastatin  500 mg Intravenous Q6H  . insulin aspart  0-15 Units Subcutaneous TID WC  . insulin aspart  0-5 Units Subcutaneous QHS  . levothyroxine  25 mcg Oral QAC breakfast  . loratadine  10 mg Oral Daily  . montelukast  10 mg Oral Daily  . multivitamin with minerals  1 tablet Oral Daily  . pneumococcal 23 valent vaccine  0.5 mL Intramuscular Tomorrow-1000  . sodium chloride  3 mL Intravenous Q12H  . vancomycin  1,000 mg Intravenous Q12H   Continuous Infusions: . sodium chloride 1,000 mL (05/06/12 1124)   PRN Meds:.sodium chloride, acetaminophen, acetaminophen, albuterol, HYDROmorphone (DILAUDID) injection, ondansetron (ZOFRAN) IV, ondansetron, oxyCODONE, sodium chloride  Antibiotics: Anti-infectives   Start     Dose/Rate Route Frequency Ordered Stop   05/06/12  1000  imipenem-cilastatin (PRIMAXIN) 500 mg in sodium chloride 0.9 % 100 mL IVPB     500 mg 200 mL/hr over 30 Minutes Intravenous Every 6 hours 05/06/12 0822     05/06/12 0900  vancomycin (VANCOCIN) IVPB 1000 mg/200 mL premix     1,000 mg 200 mL/hr over 60 Minutes Intravenous Every 12 hours 05/06/12 0822     05/05/12 2000  imipenem-cilastatin (PRIMAXIN) 500 mg in sodium chloride 0.9 % 100 mL IVPB  Status:  Discontinued     500 mg 200 mL/hr over 30 Minutes Intravenous Every 8 hours 05/05/12 1955 05/06/12 0822   05/05/12 2000  vancomycin (VANCOCIN) 1,500 mg in sodium chloride 0.9 % 500 mL IVPB  Status:  Discontinued     1,500 mg 250 mL/hr over 120 Minutes Intravenous Every 24 hours 05/05/12 1955 05/06/12 0822   05/05/12 1445  cefTRIAXone (ROCEPHIN) 1 g in dextrose 5 % 50 mL IVPB     1 g 100 mL/hr over 30 Minutes Intravenous  Once 05/05/12 1438 05/05/12 1706   05/05/12 1415  clindamycin (CLEOCIN) IVPB 600 mg  Status:  Discontinued     600 mg 100 mL/hr over 30 Minutes Intravenous  Once 05/05/12 1411 05/05/12 1438       Jeoffrey Massed, MD  [facility] Pager:[phone]  If 7PM-7AM, please contact night-coverage [email or web address] Password [email or web address] 05/07/2012, 10:29 AM   LOS: 2 days

## 2012-05-08 LAB — GLUCOSE, CAPILLARY
Glucose-Capillary: 139 mg/dL — ABNORMAL HIGH (ref 70–99)
Glucose-Capillary: 202 mg/dL — ABNORMAL HIGH (ref 70–99)

## 2012-05-08 MED ORDER — SULFAMETHOXAZOLE-TMP DS 800-160 MG PO TABS
1.0000 | ORAL_TABLET | Freq: Two times a day (BID) | ORAL | Status: DC
  Administered 2012-05-08 – 2012-05-09 (×3): 1 via ORAL
  Filled 2012-05-08 (×4): qty 1

## 2012-05-08 NOTE — Progress Notes (Signed)
PATIENT DETAILS Name: Jessica Farrell Age: 67 y.o. Sex: female Date of Birth: [birth year] Admit Date: 05/05/2012 Admitting Physician Laveda Norman, MD PCP:No primary provider on file.  Subjective: Improvement continues in her left leg erythema, decreased swelling and significantly decreased pain.Bullae unchanged in the left lateral ankle area  Assessment/Plan: Active Problems:   Cellulitis of left lower leg - Significant clinical improvement continues - Afebrile overnight, leukocytosis has resolved - Continue with empiric antibiotics-day 4 of vancomycin and Primaxin-will stop Primaxin - Doppler ultrasound of left lower extremity-neg for DVT - Elevate left leg -Will have wound care RN evaluate the left ankle blister to see if it needs to be de-roofed-otherwise her leg looks really improved - Monitor clinical course-will follow closely - Blood cultures on 3/16-neg  Urinary tract infection - Continue with Primaxin -  urine culture drawn on 3/16-lebsiella-change to Bactrim  Mild acute renal insufficiency - Likely prerenal - Decrease IV fluids- as this has resolved  Hyponatremia  - Likely secondary to dehydration and possibly hypothyroidism - Sodium significantly better, monitor periodically    Diabetes mellitus - CBG is significantly elevated - A1c 7.6 - Continue with  SSI for now-will discharge on Metformin -Diabetic Education  Hypothyroidism - TSH is 53.2- apparently patient has been noncompliant with thyroid supplementation therapy  - Start edlevothyroxine- will start at 25 mcg - Recheck TSH in 3 months - Check free T4  Dyslipidemia - Given diabetes, would like to keep LDL less than 100. - Will start statins prior to discharge  Tinea pedis - Continue with clotrimazole cream  Bronchial asthma - Lungs clear - As needed nebulized bronchodilators  Disposition: Remain inpatient-Home-hopefull tomorrow  DVT Prophylaxis: Prophylactic  Heparin  Code Status: Full  code   Procedures:  None  CONSULTS:  None  PHYSICAL EXAM: Vital signs in last 24 hours: Filed Vitals:   05/07/12 0522 05/07/12 1311 05/07/12 2115 05/08/12 0601  BP: 101/69 98/64 107/69 104/68  Pulse: 82 87 83 82  Temp: 98.3 F (36.8 C) 99.2 F (37.3 C) 99.5 F (37.5 C) 98.6 F (37 C)  TempSrc: Oral Oral Oral Oral  Resp: 18 18 20 20   Height:      Weight:      SpO2: 96% 96% 97% 93%    Weight change:  Body mass index is 31.81 kg/(m^2).   Gen Exam: Awake and alert with clear speech.   Neck: Supple, No JVD.   Chest: B/L Clear.  No rales CVS: S1 S2 Regular, no murmurs.  Abdomen: soft, BS +, non tender, non distended.  Extremities: no edema, lower extremities warm to touch. Left lower extremity decrease in diffuse circumferential erythema present-in some areas-erythema has resolved completely-leg is not tense, sensation is intact. There is significantly decreased-erythema in the foot area-area has been demarcated on admission.Larege Bulla in left lateral ankle present-some small one present in the post ankle area Neurologic: Non Focal.  Skin: No Rash.   Wounds: N/A.    Intake/Output from previous day:  Intake/Output Summary (Last 24 hours) at 05/08/12 1009 Last data filed at 05/08/12 0600  Gross per 24 hour  Intake    700 ml  Output      0 ml  Net    700 ml     LAB RESULTS: CBC  Recent Labs Lab 05/05/12 1305 05/05/12 2032 05/06/12 0619 05/07/12 0530  WBC 15.0* 14.6* 9.6 9.9  HGB 13.3 11.5* 10.4* 10.0*  HCT 39.1 33.3* 30.1* 29.1*  PLT 183 169 152 170  MCV 88.9  87.2 85.8 87.9  MCH 30.2 30.1 29.6 30.2  MCHC 34.0 34.5 34.6 34.4  RDW 13.7 13.3 13.5 13.6  LYMPHSABS 1.2  --   --   --   MONOABS 0.6  --   --   --   EOSABS 0.0  --   --   --   BASOSABS 0.0  --   --   --     Chemistries   Recent Labs Lab 05/05/12 1305 05/05/12 2032 05/06/12 0619 05/07/12 0530  NA 128*  --  133* 136  K 3.7  --  3.4* 3.8  CL 91*  --  100 101  CO2 24  --  23 27  GLUCOSE  352*  --  177* 196*  BUN 31*  --  14 10  CREATININE 1.20* 0.85 0.76 0.74  CALCIUM 9.2  --  8.4 8.2*    CBG:  Recent Labs Lab 05/07/12 0758 05/07/12 1129 05/07/12 1613 05/07/12 2312 05/08/12 0756  GLUCAP 169* 204* 202* 218* 151*    GFR Estimated Creatinine Clearance: 75.2 ml/min (by C-G formula based on Cr of 0.74).  Coagulation profile No results found for this basename: INR, PROTIME,  in the last 168 hours  Cardiac Enzymes No results found for this basename: CK, CKMB, TROPONINI, MYOGLOBIN,  in the last 168 hours  No components found with this basename: POCBNP,  No results found for this basename: DDIMER,  in the last 72 hours  Recent Labs  05/05/12 2032  HGBA1C 7.6*    Recent Labs  05/06/12 0619  CHOL 191  HDL 14*  LDLCALC 126*  TRIG 253*  CHOLHDL 13.6    Recent Labs  05/05/12 2032  TSH 53.243*   No results found for this basename: VITAMINB12, FOLATE, FERRITIN, TIBC, IRON, RETICCTPCT,  in the last 72 hours No results found for this basename: LIPASE, AMYLASE,  in the last 72 hours  Urine Studies No results found for this basename: UACOL, UAPR, USPG, UPH, UTP, UGL, UKET, UBIL, UHGB, UNIT, UROB, ULEU, UEPI, UWBC, URBC, UBAC, CAST, CRYS, UCOM, BILUA,  in the last 72 hours  MICROBIOLOGY: Recent Results (from the past 240 hour(s))  URINE CULTURE     Status: None   Collection Time    05/05/12  2:03 PM      Result Value Range Status   Specimen Description URINE, CLEAN CATCH   Final   Special Requests NONE   Final   Culture  Setup Time 05/06/2012 08:59   Final   Colony Count >=100,000 COLONIES/ML   Final   Culture KLEBSIELLA PNEUMONIAE   Final   Report Status 05/07/2012 FINAL   Final   Organism ID, Bacteria KLEBSIELLA PNEUMONIAE   Final  CULTURE, BLOOD (ROUTINE X 2)     Status: None   Collection Time    05/05/12  8:33 PM      Result Value Range Status   Specimen Description BLOOD LEFT ARM   Final   Special Requests BOTTLES DRAWN AEROBIC AND ANAEROBIC  10CC EACH   Final   Culture  Setup Time 05/06/2012 06:42   Final   Culture     Final   Value:        BLOOD CULTURE RECEIVED NO GROWTH TO DATE CULTURE WILL BE HELD FOR 5 DAYS BEFORE ISSUING A FINAL NEGATIVE REPORT   Report Status PENDING   Incomplete  CULTURE, BLOOD (ROUTINE X 2)     Status: None   Collection Time    05/05/12  8:40  PM      Result Value Range Status   Specimen Description BLOOD RIGHT HAND   Final   Special Requests BOTTLES DRAWN AEROBIC AND ANAEROBIC 10CC EACH   Final   Culture  Setup Time 05/06/2012 06:42   Final   Culture     Final   Value:        BLOOD CULTURE RECEIVED NO GROWTH TO DATE CULTURE WILL BE HELD FOR 5 DAYS BEFORE ISSUING A FINAL NEGATIVE REPORT   Report Status PENDING   Incomplete    RADIOLOGY STUDIES/RESULTS: Dg Chest 2 View  05/05/2012  *RADIOLOGY REPORT*  Clinical Data: Cough.  Short of breath.  CHEST - 2 VIEW  Comparison: None.  Findings: Cardiopericardial silhouette upper limits of normal for projection.    Streaky opacity is present at the lung bases favored to represent atelectasis over airspace disease/pneumonia.  This predominately appears in the lower lobes on the lateral view. There is no pleural effusion.  Subsegmental atelectasis or scarring.  The left costophrenic angle.  IMPRESSION: Streaky bibasilar opacities favored represent atelectasis rather than pneumonia or aspiration.   Original Report Authenticated By: Andreas Newport, M.D.     MEDICATIONS: Scheduled Meds: . clotrimazole   Topical BID  . heparin  5,000 Units Subcutaneous Q8H  . influenza  inactive virus vaccine  0.5 mL Intramuscular Tomorrow-1000  . insulin aspart  0-15 Units Subcutaneous TID WC  . insulin aspart  0-5 Units Subcutaneous QHS  . levothyroxine  25 mcg Oral QAC breakfast  . loratadine  10 mg Oral Daily  . montelukast  10 mg Oral Daily  . multivitamin with minerals  1 tablet Oral Daily  . sodium chloride  3 mL Intravenous Q12H  . sulfamethoxazole-trimethoprim  1 tablet  Oral Q12H  . vancomycin  1,000 mg Intravenous Q12H   Continuous Infusions:   PRN Meds:.sodium chloride, acetaminophen, acetaminophen, albuterol, HYDROmorphone (DILAUDID) injection, ondansetron (ZOFRAN) IV, ondansetron, oxyCODONE, sodium chloride  Antibiotics: Anti-infectives   Start     Dose/Rate Route Frequency Ordered Stop   05/08/12 1030  sulfamethoxazole-trimethoprim (BACTRIM DS) 800-160 MG per tablet 1 tablet     1 tablet Oral Every 12 hours 05/08/12 0940     05/06/12 1000  imipenem-cilastatin (PRIMAXIN) 500 mg in sodium chloride 0.9 % 100 mL IVPB  Status:  Discontinued     500 mg 200 mL/hr over 30 Minutes Intravenous Every 6 hours 05/06/12 0822 05/08/12 0939   05/06/12 0900  vancomycin (VANCOCIN) IVPB 1000 mg/200 mL premix     1,000 mg 200 mL/hr over 60 Minutes Intravenous Every 12 hours 05/06/12 0822     05/05/12 2000  imipenem-cilastatin (PRIMAXIN) 500 mg in sodium chloride 0.9 % 100 mL IVPB  Status:  Discontinued     500 mg 200 mL/hr over 30 Minutes Intravenous Every 8 hours 05/05/12 1955 05/06/12 0822   05/05/12 2000  vancomycin (VANCOCIN) 1,500 mg in sodium chloride 0.9 % 500 mL IVPB  Status:  Discontinued     1,500 mg 250 mL/hr over 120 Minutes Intravenous Every 24 hours 05/05/12 1955 05/06/12 0822   05/05/12 1445  cefTRIAXone (ROCEPHIN) 1 g in dextrose 5 % 50 mL IVPB     1 g 100 mL/hr over 30 Minutes Intravenous  Once 05/05/12 1438 05/05/12 1706   05/05/12 1415  clindamycin (CLEOCIN) IVPB 600 mg  Status:  Discontinued     600 mg 100 mL/hr over 30 Minutes Intravenous  Once 05/05/12 1411 05/05/12 1438       Kriya Westra,  MD  [facility] Pager:[phone]  If 7PM-7AM, please contact night-coverage [email or web address] Password [email or web address] 05/08/2012, 10:09 AM   LOS: 3 days

## 2012-05-08 NOTE — Progress Notes (Addendum)
Inpatient Diabetes Program Recommendations  AACE/ADA: New Consensus Statement on Inpatient Glycemic Control (2013)  Target Ranges:  Prepandial:   less than 140 mg/dL      Peak postprandial:   less than 180 mg/dL (1-2 hours)      Critically ill patients:  140 - 180 mg/dL    Patient with new onset diabetes.  Noted DM Coordinator spoke with patient on 03/17.  Diet education completed by RD on 03/17.  RNs to provide ongoing diabetes education at bedside.  Noted Dr. Windell Norfolk plans to send patient home on Metformin.  Agree.  Patient will follow up with Dr. Wylene Simmer on 03/25 per Care management notes.  Will assist in getting patient set up with Outpatient DM education after d/c at the [facility] Nutrition and DM Management center.  Addendum 1241pm: Spoke with patient about her new diabetes diagnosis briefly again today.  Explained to patient that she will be d/c'd home on Metformin.  Explained what Metformin is and how it works.  Also explained possible side effects.  Also explained what as A1c is and what it measures.  Relayed to patient that her A1c was 7.6%.  Patient appreciative of all the information I provided to her.  Will placed referral for OP DM education today to the [facility] Nutrition and DM management center.   Will follow. Ambrose Finland RN, MSN, CDE Diabetes Coordinator Inpatient Diabetes Program [phone]

## 2012-05-08 NOTE — Consult Note (Signed)
WOC consult Note Reason for Consult: pt seen with Dr. Jerral Ralph at the bedside to evaluate bulla of the left lateral ankle. Pt with cellulitis with diffuse erthyma over the entire LE below the knee. Evidence of bulla that are reabsorbing on the medial ankle/foot.  Per MD cellulitis has improved. Pt still with edema, palpable DP pulses.  Wound type: bulla related to LE cellulitis Measurement:3.0cm x 4.5cm  Wound WUJ:WJXBJY filled bulla, per MD has gotten bigger, is not reabsorbing like the others have. Conservative sharp wound debridement (CSWD performed at the bedside: cleansed bulla with betadine swab stick, using sterile scissors opened bulla and drained serous fluid from the area. Pt tolerated without problems.  Dressing procedure/placement/frequency: covered area with xeroform gauze for its antibacterial effects now that this area has small opening.  Will assess in am for any changes or need for further intervention.  Sheronda Parran [place] RN,CWOCN [phone]

## 2012-05-09 LAB — CBC
MCV: 86.3 fL (ref 78.0–100.0)
Platelets: 232 10*3/uL (ref 150–400)
RBC: 3.58 MIL/uL — ABNORMAL LOW (ref 3.87–5.11)
RDW: 13.5 % (ref 11.5–15.5)
WBC: 8.6 10*3/uL (ref 4.0–10.5)

## 2012-05-09 LAB — GLUCOSE, CAPILLARY
Glucose-Capillary: 160 mg/dL — ABNORMAL HIGH (ref 70–99)
Glucose-Capillary: 173 mg/dL — ABNORMAL HIGH (ref 70–99)

## 2012-05-09 MED ORDER — LEVOTHYROXINE SODIUM 25 MCG PO TABS: 25.0000 ug | ORAL_TABLET | Freq: Every day | ORAL | Status: DC

## 2012-05-09 MED ORDER — HYDROCORTISONE ACETATE 25 MG RE SUPP
25.0000 mg | Freq: Two times a day (BID) | RECTAL | Status: DC
  Administered 2012-05-09: 25 mg via RECTAL
  Filled 2012-05-09 (×2): qty 1

## 2012-05-09 MED ORDER — SULFAMETHOXAZOLE-TMP DS 800-160 MG PO TABS: 1.0000 | ORAL_TABLET | Freq: Two times a day (BID) | ORAL | Status: DC

## 2012-05-09 MED ORDER — HYDROCORTISONE ACETATE 25 MG RE SUPP: 25.0000 mg | Freq: Two times a day (BID) | RECTAL | Status: DC

## 2012-05-09 MED ORDER — CLOTRIMAZOLE 1 % EX CREA: TOPICAL_CREAM | Freq: Two times a day (BID) | CUTANEOUS | Status: DC

## 2012-05-09 MED ORDER — ATORVASTATIN CALCIUM 10 MG PO TABS: 10.0000 mg | ORAL_TABLET | Freq: Every day | ORAL | Status: DC

## 2012-05-09 MED ORDER — METFORMIN HCL 500 MG PO TABS: 500.0000 mg | ORAL_TABLET | Freq: Two times a day (BID) | ORAL | Status: DC

## 2012-05-09 MED ORDER — HYDROCORTISONE 2.5 % RE CREA
TOPICAL_CREAM | Freq: Three times a day (TID) | RECTAL | Status: DC
  Administered 2012-05-09: 10:00:00 via RECTAL
  Filled 2012-05-09: qty 28.35

## 2012-05-09 MED ORDER — WITCH HAZEL-GLYCERIN EX PADS
MEDICATED_PAD | CUTANEOUS | Status: DC | PRN
  Filled 2012-05-09: qty 100

## 2012-05-09 NOTE — Progress Notes (Signed)
Patient discharge teaching given, including activity, diet, follow-up appoints, and medications. Patient verbalized understanding of all discharge instructions. IV access was d/c'd. Vitals are stable. Skin is intact except as charted in most recent assessments. Pt to be escorted out by NT, to be driven home by family later this afternoon.

## 2012-05-09 NOTE — Consult Note (Signed)
WOC follow up Pt still has edema of the LLE Wound type: bulla secondary to cellulitis Measurement:3.0cm x 4.5cm  Wound bed: red wound base Conservative sharp wound debridement (CSWD performed at the bedside: attempted to remove solid jelly like portion of bulla; however it was adherent to the leg, feel this area with the edema will continue to drain for some time.  Drainage (amount, consistency, odor) serous fluid, heavy drainage, no odor Periwound:purpura around the bulla Dressing procedure/placement/frequency: continue xeroform dressing for antibacterial effects.  Educated pt on current color of drainage and to monitor this for any changes once discharged. Will have HHRN to work with pt and family on daily dressing changes.    Re consult if needed, will not follow at this time. Thanks  Ermal Haberer [employer], CWOCN [phone])

## 2012-05-09 NOTE — Progress Notes (Signed)
Pt mentioned to RN that her bottom had started to bleed. She later said that she checked and it seemed to be her hemorrhoids. She asked RN to call night coverage to d/c heparin. RN called and had morning heparin put on hold. Will put pad on pt to begin measuring blood.

## 2012-05-09 NOTE — Progress Notes (Signed)
ANTIBIOTIC CONSULT NOTE - FOLLOW UP  Pharmacy Consult:  Vancomycin Indication:  LLL cellulitis  Allergies  Allergen Reactions  . Penicillins Rash    Patient Measurements: Height: 5\' 5"  (165.1 cm) Weight: 191 lb 2.2 oz (86.7 kg) IBW/kg (Calculated) : 57  Vital Signs: Temp: 98.5 F (36.9 C) (03/20 0530) Temp src: Oral (03/20 0530) BP: 106/65 mmHg (03/20 0530) Pulse Rate: 85 (03/20 0530)  Labs:  Recent Labs  05/07/12 0530 05/09/12 0613  WBC 9.9 8.6  HGB 10.0* 10.6*  PLT 170 232  CREATININE 0.74  --    Estimated Creatinine Clearance: 75.2 ml/min (by C-G formula based on Cr of 0.74). No results found for this basename: VANCOTROUGH, VANCOPEAK, VANCORANDOM, GENTTROUGH, GENTPEAK, GENTRANDOM, TOBRATROUGH, TOBRAPEAK, TOBRARND, AMIKACINPEAK, AMIKACINTROU, AMIKACIN,  in the last 72 hours   Microbiology: Recent Results (from the past 720 hour(s))  URINE CULTURE     Status: None   Collection Time    05/05/12  2:03 PM      Result Value Range Status   Specimen Description URINE, CLEAN CATCH   Final   Special Requests NONE   Final   Culture  Setup Time 05/06/2012 08:59   Final   Colony Count >=100,000 COLONIES/ML   Final   Culture KLEBSIELLA PNEUMONIAE   Final   Report Status 05/07/2012 FINAL   Final   Organism ID, Bacteria KLEBSIELLA PNEUMONIAE   Final  CULTURE, BLOOD (ROUTINE X 2)     Status: None   Collection Time    05/05/12  8:33 PM      Result Value Range Status   Specimen Description BLOOD LEFT ARM   Final   Special Requests BOTTLES DRAWN AEROBIC AND ANAEROBIC 10CC EACH   Final   Culture  Setup Time 05/06/2012 06:42   Final   Culture     Final   Value:        BLOOD CULTURE RECEIVED NO GROWTH TO DATE CULTURE WILL BE HELD FOR 5 DAYS BEFORE ISSUING A FINAL NEGATIVE REPORT   Report Status PENDING   Incomplete  CULTURE, BLOOD (ROUTINE X 2)     Status: None   Collection Time    05/05/12  8:40 PM      Result Value Range Status   Specimen Description BLOOD RIGHT HAND    Final   Special Requests BOTTLES DRAWN AEROBIC AND ANAEROBIC 10CC EACH   Final   Culture  Setup Time 05/06/2012 06:42   Final   Culture     Final   Value:        BLOOD CULTURE RECEIVED NO GROWTH TO DATE CULTURE WILL BE HELD FOR 5 DAYS BEFORE ISSUING A FINAL NEGATIVE REPORT   Report Status PENDING   Incomplete       Assessment: 81 YOM continues on vancomycin for cellulitis that is improving.  Her urine culture grew Kleb pneumoniae and Primaxin was discontinued.  Septra started 05/08/12 in anticipation of discharge.  Patient's renal function is stable.  Vanc 3/16 >> Primaxin 3/16 >> 3/19 Septra 3/19 >>  3/16 blood cx - NGTD 3/16 urine cx - Kleb pneumoniae (pan-sensitive)   Goal of Therapy:  Vanc trough 10 - 15 mcg/mL   Plan:  - Vanc 1gm IV Q12H - Continue Septra 1 tab PO BID as ordered - Monitor renal function - Will not check vanc trough unless patient to remain on medication - F/U bleeding and the need to d/c SQ heparin     Nikki Glanzer D. Laney Potash, PharmD, BCPS Pager:  [phone] 05/09/2012, 8:17 AM

## 2012-05-09 NOTE — Discharge Summary (Signed)
PATIENT DETAILS Name: Jessica Farrell Age: 67 y.o. Sex: female Date of Birth: 1945-05-27 MRN: [id number]. Admit Date: 05/05/2012 Admitting Physician: Laveda Norman, MD JYN:[id number],HYQMVHQ W, MD  Recommendations for Outpatient Follow-up:  1. Patient will need Gen. health maintenance including cancer screening 2. Newly diagnosed diabetes-started on metformin-will need further management in the outpatient setting 3. Levothyroxine started this admission-recheck TSH in 3 months 4. Reassess that today at followup visit 5. Does have a history of intermittent rectal bleeding-claims to have hemorrhoids-may need screening colonoscopy  PRIMARY DISCHARGE DIAGNOSIS:  Active Problems:   Cellulitis of left lower leg   Diabetes mellitus   Dehydration   AKI (acute kidney injury)   Hyponatremia   Hypothyroidism   UTI (lower urinary tract infection)   Asthma      PAST MEDICAL HISTORY: Past Medical History  Diagnosis Date  . Asthma   . Thyroid disease   . Diabetes mellitus 05/05/2012    DISCHARGE MEDICATIONS:   Medication List    TAKE these medications       albuterol 108 (90 BASE) MCG/ACT inhaler  Commonly known as:  PROVENTIL HFA;VENTOLIN HFA  Inhale 2 puffs into the lungs every 4 (four) hours as needed for wheezing.     Aloe Vera Gel  Apply 1 application topically daily.     atorvastatin 10 MG tablet  Commonly known as:  LIPITOR  Take 1 tablet (10 mg total) by mouth daily.     cetirizine 10 MG tablet  Commonly known as:  ZYRTEC  Take 10 mg by mouth daily.     clotrimazole 1 % cream  Commonly known as:  LOTRIMIN  Apply topically 2 (two) times daily. Apply in between toes     GLUCOSAMINE PO  Take 1 tablet by mouth daily.     hydrocortisone 25 MG suppository  Commonly known as:  ANUSOL-HC  Place 1 suppository (25 mg total) rectally 2 (two) times daily.     levothyroxine 25 MCG tablet  Commonly known as:  SYNTHROID, LEVOTHROID  Take 1 tablet (25 mcg total) by mouth daily  before breakfast.     metFORMIN 500 MG tablet  Commonly known as:  GLUCOPHAGE  Take 1 tablet (500 mg total) by mouth 2 (two) times daily with a meal.     montelukast 10 MG tablet  Commonly known as:  SINGULAIR  Take 10 mg by mouth daily.     multivitamin with minerals tablet  Take 1 tablet by mouth daily.     pseudoephedrine-guaifenesin 60-600 MG per tablet  Commonly known as:  MUCINEX D  Take 1 tablet by mouth daily.     sulfamethoxazole-trimethoprim 800-160 MG per tablet  Commonly known as:  BACTRIM DS  Take 1 tablet by mouth every 12 (twelve) hours.         BRIEF HPI:  See H&P, Labs, Consult and Test reports for all details in brief, patient is a 67 year old female who has been noncompliant with medications and has no followup in the past with her regular doctor, does have a history of bronchial asthma, morbid obesity, hypothyroidism-noncompliant with levothyroxine therapy who presented on 3/16 for progressive left leg pain, swelling erythema associated with fever and weakness.  CONSULTATIONS:   None  PERTINENT RADIOLOGIC STUDIES: Dg Chest 2 View  05/05/2012  *RADIOLOGY REPORT*  Clinical Data: Cough.  Short of breath.  CHEST - 2 VIEW  Comparison: None.  Findings: Cardiopericardial silhouette upper limits of normal for projection.    Streaky opacity is present at  the lung bases favored to represent atelectasis over airspace disease/pneumonia.  This predominately appears in the lower lobes on the lateral view. There is no pleural effusion.  Subsegmental atelectasis or scarring.  The left costophrenic angle.  IMPRESSION: Streaky bibasilar opacities favored represent atelectasis rather than pneumonia or aspiration.   Original Report Authenticated By: Andreas Newport, M.D.      PERTINENT LAB RESULTS: CBC:  Recent Labs  05/07/12 0530 05/09/12 0613  WBC 9.9 8.6  HGB 10.0* 10.6*  HCT 29.1* 30.9*  PLT 170 232   CMET CMP     Component Value Date/Time   NA 136 05/07/2012  0530   K 3.8 05/07/2012 0530   CL 101 05/07/2012 0530   CO2 27 05/07/2012 0530   GLUCOSE 196* 05/07/2012 0530   BUN 10 05/07/2012 0530   CREATININE 0.74 05/07/2012 0530   CALCIUM 8.2* 05/07/2012 0530   PROT 5.8* 05/07/2012 0530   ALBUMIN 2.2* 05/07/2012 0530   AST 20 05/07/2012 0530   ALT 37* 05/07/2012 0530   ALKPHOS 119* 05/07/2012 0530   BILITOT 0.3 05/07/2012 0530   GFRNONAA 87* 05/07/2012 0530   GFRAA >90 05/07/2012 0530    GFR Estimated Creatinine Clearance: 75.2 ml/min (by C-G formula based on Cr of 0.74). No results found for this basename: LIPASE, AMYLASE,  in the last 72 hours No results found for this basename: CKTOTAL, CKMB, CKMBINDEX, TROPONINI,  in the last 72 hours No components found with this basename: POCBNP,  No results found for this basename: DDIMER,  in the last 72 hours No results found for this basename: HGBA1C,  in the last 72 hours No results found for this basename: CHOL, HDL, LDLCALC, TRIG, CHOLHDL, LDLDIRECT,  in the last 72 hours No results found for this basename: TSH, T4TOTAL, FREET3, T3FREE, THYROIDAB,  in the last 72 hours No results found for this basename: VITAMINB12, FOLATE, FERRITIN, TIBC, IRON, RETICCTPCT,  in the last 72 hours Coags: No results found for this basename: PT, INR,  in the last 72 hours Microbiology: Recent Results (from the past 240 hour(s))  URINE CULTURE     Status: None   Collection Time    05/05/12  2:03 PM      Result Value Range Status   Specimen Description URINE, CLEAN CATCH   Final   Special Requests NONE   Final   Culture  Setup Time 05/06/2012 08:59   Final   Colony Count >=100,000 COLONIES/ML   Final   Culture KLEBSIELLA PNEUMONIAE   Final   Report Status 05/07/2012 FINAL   Final   Organism ID, Bacteria KLEBSIELLA PNEUMONIAE   Final  CULTURE, BLOOD (ROUTINE X 2)     Status: None   Collection Time    05/05/12  8:33 PM      Result Value Range Status   Specimen Description BLOOD LEFT ARM   Final   Special Requests BOTTLES  DRAWN AEROBIC AND ANAEROBIC 10CC EACH   Final   Culture  Setup Time 05/06/2012 06:42   Final   Culture     Final   Value:        BLOOD CULTURE RECEIVED NO GROWTH TO DATE CULTURE WILL BE HELD FOR 5 DAYS BEFORE ISSUING A FINAL NEGATIVE REPORT   Report Status PENDING   Incomplete  CULTURE, BLOOD (ROUTINE X 2)     Status: None   Collection Time    05/05/12  8:40 PM      Result Value Range Status   Specimen  Description BLOOD RIGHT HAND   Final   Special Requests BOTTLES DRAWN AEROBIC AND ANAEROBIC 10CC EACH   Final   Culture  Setup Time 05/06/2012 06:42   Final   Culture     Final   Value:        BLOOD CULTURE RECEIVED NO GROWTH TO DATE CULTURE WILL BE HELD FOR 5 DAYS BEFORE ISSUING A FINAL NEGATIVE REPORT   Report Status PENDING   Incomplete     BRIEF HOSPITAL COURSE:   Active Problems:   Cellulitis of left lower leg - Patient was admitted, empirically started on vancomycin and Primaxin. The area of cellulitis was demarcated by a marker, the area involved the ankle area all the way just above the knee. There were intact the lungs especially on the left lateral ankle area that were present on admission. There were also smaller ulcers on the posterior aspect of her ankle/heel area. Patient was also febrile on admission with significant leukocytosis. - With antibiotic therapy, she became afebrile and leukocytosis resolved. Her cellulitic area also slowly improve, there was significant receding of the erythema from the demarcated area, there are currently only patchy erythematous area on the left lower extremity, some of the areas have completely cleared. On admission, patient had problems bearing weight on that left lower extremity, by the day of discharge she is easily able to bear weight and walk around. As noted above she is persistently afebrile, no longer has leukocytosis and reports significant clinical improvement. In the areas that the erythema remains, it is significantly lighter. The leg has  intact sensation and does not appear tense as well. Patient is anxious to be discharged home. We consulted wound care services during her stay here, the large on the lateral aspect of her left ankle was de-roofed, she was observed overnight, some mild status discharged continues, at this time it is felt that she is safe enough to be discharged home with oral antibiotic therapy and RN to monitor the leg at home. A follow up appointment with her new primary care practitioner has been arranged for 3/25. A Doppler ultrasound was negative for DVT on the left lower extremity. On discharge she will be transitioned to oral Bactrim therapy for another one week. She has been advised to try and keep her left lower extremity elevated most of the time.  Urinary tract infection - On admission she also had a urinalysis that was assisted of urinary tract infection, blood cultures were drawn that was negative. Urine cultures were positive for Klebsiella, she will be discharged on Bactrim  Mild acute renal insufficiency  - Likely prerenal - Treated with IV fluids-this is resolved  Hyponatremia  - Likely secondary to dehydration and possibly hypothyroidism  - Sodium significantly better, monitor periodically   Diabetes mellitus  - CBG is significantly elevated  - A1c 7.6  - Continue with SSI for now-will discharge on Metformin  -Diabetic Education provided during the hospital stay  Hypothyroidism  - TSH is 53.2- apparently patient has been noncompliant with thyroid supplementation therapy  - Start edlevothyroxine- will start at 25 mcg  - Recheck TSH in 3 months  Dyslipidemia  - Given diabetes, would like to keep LDL less than 100.  - Will start statins prior to discharge  Bronchial asthma  - Lungs clear  - As needed nebulized bronchodilators  Rectal bleeding - Patient apparently has a known history of hemorrhoids-she had one episode of bright red blood per rectum last night, no further episodes  since. Hemoglobin is stable, she will be provided Anusol suppository on discharge. Colonoscopy and further workup can be considered in the outpatient setting follows up with her primary care practitioner  TODAY-DAY OF DISCHARGE:  Subjective:   Jessica Farrell today has no headache,no chest abdominal pain,no new weakness tingling or numbness, feels much better wants to go home today.   Objective:   Blood pressure 106/65, pulse 85, temperature 98.5 F (36.9 C), temperature source Oral, resp. rate 20, height 5\' 5"  (1.651 m), weight 86.7 kg (191 lb 2.2 oz), SpO2 93.00%.  Intake/Output Summary (Last 24 hours) at 05/09/12 1220 Last data filed at 05/09/12 0900  Gross per 24 hour  Intake     42 ml  Output      0 ml  Net     42 ml    Exam Awake Alert, Oriented *3, No new F.N deficits, Normal affect [place].AT,PERRAL Supple Neck,No JVD, No cervical lymphadenopathy appriciated.  Symmetrical Chest wall movement, Good air movement bilaterally, CTAB RRR,No Gallops,Rubs or new Murmurs, No Parasternal Heave +ve B.Sounds, Abd Soft, Non tender, No organomegaly appriciated, No rebound -guarding or rigidity. No Cyanosis, Clubbing or edema, No new Rash or bruise  DISCHARGE CONDITION: Stable  DISPOSITION: HOME WITH HOME HEALTH RN   DISCHARGE INSTRUCTIONS:    Activity:  As tolerated   Diet recommendation: Diabetic Diet  Follow-up Information   Follow up with Gaspar Garbe, MD On 05/14/2012. (be at md office at 2:30 for a 300 appt bring insurance infor, and your drivers license, make sure you go to portal on your email act and register, they sent you an invitation.)    Contact information:   [address] [facility] [place] [address] [phone]        Total Time spent on discharge equals 45 minutes.  SignedJeoffrey Massed 05/09/2012 12:20 PM

## 2012-05-12 LAB — CULTURE, BLOOD (ROUTINE X 2): Culture: NO GROWTH

## 2012-05-15 DIAGNOSIS — E785 Hyperlipidemia, unspecified: Secondary | ICD-10-CM | POA: Insufficient documentation

## 2012-05-15 DIAGNOSIS — E1169 Type 2 diabetes mellitus with other specified complication: Secondary | ICD-10-CM | POA: Insufficient documentation

## 2012-06-18 ENCOUNTER — Encounter: Payer: Self-pay | Admitting: Vascular Surgery

## 2012-06-18 ENCOUNTER — Other Ambulatory Visit: Payer: Self-pay

## 2012-06-18 DIAGNOSIS — L03116 Cellulitis of left lower limb: Secondary | ICD-10-CM

## 2012-06-18 DIAGNOSIS — M79609 Pain in unspecified limb: Secondary | ICD-10-CM

## 2012-08-01 ENCOUNTER — Encounter: Payer: Self-pay | Admitting: Vascular Surgery

## 2012-08-02 ENCOUNTER — Ambulatory Visit (INDEPENDENT_AMBULATORY_CARE_PROVIDER_SITE_OTHER): Payer: Medicare Other | Admitting: Vascular Surgery

## 2012-08-02 ENCOUNTER — Encounter (INDEPENDENT_AMBULATORY_CARE_PROVIDER_SITE_OTHER): Payer: Medicare Other | Admitting: *Deleted

## 2012-08-02 ENCOUNTER — Encounter: Payer: Self-pay | Admitting: Vascular Surgery

## 2012-08-02 VITALS — BP 128/85 | HR 90 | Resp 18 | Ht 65.5 in | Wt 168.0 lb

## 2012-08-02 DIAGNOSIS — L03116 Cellulitis of left lower limb: Secondary | ICD-10-CM

## 2012-08-02 DIAGNOSIS — L03119 Cellulitis of unspecified part of limb: Secondary | ICD-10-CM

## 2012-08-02 DIAGNOSIS — M79609 Pain in unspecified limb: Secondary | ICD-10-CM

## 2012-08-02 DIAGNOSIS — I872 Venous insufficiency (chronic) (peripheral): Secondary | ICD-10-CM | POA: Insufficient documentation

## 2012-08-02 NOTE — Progress Notes (Signed)
[facility] OF   Referred by:  Gaspar Garbe, MD [phone] [facility] [place], [place] [address]  Reason for referral: L leg cellulitis   History of Present Illness  Jessica Farrell is a 67 y.o. ([birth year]) female who presents with chief complaint: L leg cellulitis.  Patient notes recent admission for cellulitis in L leg.  She also has known chronic venous insufficiency in both legs.  The patient notes onset of swelling in her legs years ago, associated with no trigger.  The patient's symptoms include: swelling in L>R leg, tiredness/fatigue in both legs, and one episode of cellulitis in LLE.  The patient has a prior pregnancy, has had no history of DVT, no history of varicose vein, no history of venous stasis ulcers, no history of  Lymphedema and known history of skin changes in lower legs.  There is a family history of venous disorders.  The patient has used OTC compression stockings in the past.  Past Medical History  Diagnosis Date  . Asthma   . Anemia April 2014    Urgent Care  . Hyperlipidemia   . Diabetes mellitus 05/05/2012    Type II  . Malaise and fatigue April 2014  . Cold intolerance April 2014  . Polyuria April 2014  . Thyroid disease 1990's    HypoThyroidism  . Cellulitis March  2014    Left foot and ankle  . Morbid obesity     Past Surgical History  Procedure Laterality Date  . Fracture surgery  2000    Ankle  . Tooth extraction      wisdom Teeth    History   Social History  . Marital Status: Divorced    Spouse Name: N/A    Number of Children: N/A  . Years of Education: N/A   Occupational History  . Not on file.   Social History Main Topics  . Smoking status: Never Smoker   . Smokeless tobacco: Never Used  . Alcohol Use: No  . Drug Use: No  . Sexually Active: Not Currently    Birth Control/ Protection: Post-menopausal   Other Topics Concern  . Not on file   Social History Narrative  .  No narrative on file    Family History  Problem Relation Age of Onset  . Alzheimer's disease Father   . Heart disease Mother   . Arthritis Mother   . Hypertension Mother   . Alzheimer's disease Sister   . Heart disease Brother     Heart Disease before age 35    Current Outpatient Prescriptions on File Prior to Visit  Medication Sig Dispense Refill  . albuterol (PROVENTIL HFA;VENTOLIN HFA) 108 (90 BASE) MCG/ACT inhaler Inhale 2 puffs into the lungs every 4 (four) hours as needed for wheezing.      Marland Kitchen atorvastatin (LIPITOR) 10 MG tablet Take 1 tablet (10 mg total) by mouth daily.  30 tablet  0  . cetirizine (ZYRTEC) 10 MG tablet Take 10 mg by mouth daily.      . clotrimazole (LOTRIMIN) 1 % cream Apply topically 2 (two) times daily. Apply in between toes  30 g  0  . fish oil-omega-3 fatty acids 1000 MG capsule Take 2 g by mouth daily.      Marland Kitchen GLUCOSAMINE PO Take 1 tablet by mouth daily.      . Iron-Vitamin C (VITRON-C) 65-125 MG TABS Take by mouth daily.      Marland Kitchen levothyroxine (SYNTHROID, LEVOTHROID) 25 MCG tablet  Take 1 tablet (25 mcg total) by mouth daily before breakfast.  30 tablet  0  . metFORMIN (GLUCOPHAGE) 500 MG tablet Take 1 tablet (500 mg total) by mouth 2 (two) times daily with a meal.  60 tablet  0  . Misc Natural Products (FLEX-A-MIN DOUBLE STRENGTH PO) Take by mouth daily.      . montelukast (SINGULAIR) 10 MG tablet Take 10 mg by mouth daily.      . pseudoephedrine-guaifenesin (MUCINEX D) 60-600 MG per tablet Take 1 tablet by mouth daily.      . psyllium (REGULOID) 0.52 G capsule Take 0.52 g by mouth daily.      . ranitidine (ZANTAC) 150 MG capsule Take 150 mg by mouth 2 (two) times daily.      . Aloe Vera GEL Apply 1 application topically daily.      . hydrocortisone (ANUSOL-HC) 25 MG suppository Place 1 suppository (25 mg total) rectally 2 (two) times daily.  12 suppository  0  . Multiple Vitamins-Minerals (MULTIVITAMIN WITH MINERALS) tablet Take 1 tablet by mouth daily.       Marland Kitchen sulfamethoxazole-trimethoprim (BACTRIM DS) 800-160 MG per tablet Take 1 tablet by mouth every 12 (twelve) hours.  14 tablet  0   No current facility-administered medications on file prior to visit.    Allergies  Allergen Reactions  . Penicillins Rash    REVIEW OF SYSTEMS:  (Positives checked otherwise negative)  CARDIOVASCULAR:  []  chest pain, []  chest pressure, []  palpitations, []  shortness of breath when laying flat, []  shortness of breath with exertion,  []  pain in feet when walking, []  pain in feet when laying flat, []  history of blood clot in veins (DVT), []  history of phlebitis, [x]  swelling in legs, []  varicose veins  PULMONARY:  [x]  productive cough, [x]  asthma, [x]  wheezing  NEUROLOGIC:  [x]  weakness in arms or legs, []  numbness in arms or legs, []  difficulty speaking or slurred speech, []  temporary loss of vision in one eye, []  dizziness  HEMATOLOGIC:  []  bleeding problems, []  problems with blood clotting too easily  MUSCULOSKEL:  []  joint pain, []  joint swelling  GASTROINTEST:  []  vomiting blood, []  blood in stool     GENITOURINARY:  []  burning with urination, []  blood in urine  PSYCHIATRIC:  []  history of major depression  INTEGUMENTARY:  []  rashes, []  ulcers  CONSTITUTIONAL:  []  fever, []  chills  For VQI Use Only  PRE-ADM LIVING: Home  AMB STATUS: Ambulatory  CAD Sx: None  PRIOR CHF: None  STRESS TEST: [x]  No, [ ]  Normal, [ ]  + ischemia, [ ]  + MI, [ ]  Both  Physical Examination Filed Vitals:   08/02/12 1133  BP: 128/85  Pulse: 90  Resp: 18  Height: 5' 5.5" (1.664 m)  Weight: 168 lb (76.204 kg)   Body mass index is 27.52 kg/(m^2).  General: A&O x 3, WDWN  Head: [place]/AT  Ear/Nose/Throat: Hearing grossly intact, nares w/o erythema or drainage, oropharynx w/o Erythema/Exudate  Eyes: PERRLA, EOMI  Neck: Supple, no nuchal rigidity, no palpable LAD  Pulmonary: Sym exp, good air movt, CTAB, no rales, rhonchi, & wheezing  Cardiac: RRR, Nl S1,  S2, no Murmurs, rubs or gallops  Vascular: Vessel Right Left  Radial  Palpable  Palpable  Ulnar  Palpable  Palpable  Brachial  Palpable  Palpable  Carotid  Palpable, without bruit  Palpable, without bruit  Aorta  Not palpable N/A  Femoral  Palpable  Palpable  Popliteal  Not palpable  Not palpable  PT  Palpable  Palpable  DP  Palpable  Palpable   Gastrointestinal: soft, NTND, -G/R, - HSM, - masses, - CVAT B  Musculoskeletal: M/S 5/5 throughout , Extremities without ischemic changes , LLE edema 1+,  BLE mild LDS, spider veins throughout both legs  Neurologic: CN 2-12 intact , Pain and light touch intact in extremities , Motor exam as listed above  Psychiatric: Judgment intact, Mood & affect appropriate for pt's clinical situation  Dermatologic: See M/S exam for extremity exam, no rashes otherwise noted  Lymph : No Cervical, Axillary, or Inguinal lymphadenopathy   Non-Invasive Vascular Imaging  BLE Venous Insufficiency Duplex (Date: 08/02/2012):   RLE: no DVT and SVT, no GSV reflux, + deep venous reflux: CFV, FV  LLE: no DVT and SVT, no GSV reflux, + deep venous reflux: CFV  Outside Studies/Documentation 6 pages of outside documents were reviewed including: outpatient clinic chart.  Medical Decision Making  Aunesty Tyson is a 67 y.o. female who presents with: BLE chronic venous insufficiency (C4)   Based on the patient's history and examination, I recommend: B thigh high compression stockings 20-30 mm Hg.  If her sx progress, she can follow up in the Vein Clinic in 3 months.  I don't think that EVLA of her competent GSV will likely help her sx.  Thank you for allowing [place] to participate in this patient's care.  Leonides Sake, MD [facility] [place] Office: [phone] Pager: [phone]  08/02/2012, 1:12 PM

## 2012-10-23 ENCOUNTER — Other Ambulatory Visit (HOSPITAL_COMMUNITY): Payer: Self-pay | Admitting: Internal Medicine

## 2012-10-23 DIAGNOSIS — J45901 Unspecified asthma with (acute) exacerbation: Secondary | ICD-10-CM

## 2012-10-31 ENCOUNTER — Encounter (HOSPITAL_COMMUNITY): Payer: Medicare Other

## 2012-11-06 ENCOUNTER — Ambulatory Visit (HOSPITAL_COMMUNITY)
Admission: RE | Admit: 2012-11-06 | Discharge: 2012-11-06 | Disposition: A | Discharge status: Home / Self Care | Payer: Medicare Other | Source: Ambulatory Visit | Attending: Internal Medicine | Admitting: Internal Medicine

## 2012-11-06 DIAGNOSIS — J45901 Unspecified asthma with (acute) exacerbation: Secondary | ICD-10-CM | POA: Insufficient documentation

## 2012-11-06 MED ORDER — ALBUTEROL SULFATE (5 MG/ML) 0.5% IN NEBU
2.5000 mg | INHALATION_SOLUTION | Freq: Once | RESPIRATORY_TRACT | Status: AC
  Administered 2012-11-06: 2.5 mg via RESPIRATORY_TRACT

## 2013-03-23 DEATH — deceased

## 2013-05-21 ENCOUNTER — Other Ambulatory Visit: Payer: Self-pay | Admitting: Internal Medicine

## 2013-05-21 DIAGNOSIS — Z1231 Encounter for screening mammogram for malignant neoplasm of breast: Secondary | ICD-10-CM

## 2013-05-29 DIAGNOSIS — Z23 Encounter for immunization: Secondary | ICD-10-CM | POA: Diagnosis not present

## 2013-05-30 ENCOUNTER — Ambulatory Visit
Admission: RE | Admit: 2013-05-30 | Discharge: 2013-05-30 | Disposition: A | Discharge status: Home / Self Care | Payer: Medicare Other | Source: Ambulatory Visit | Attending: Internal Medicine | Admitting: Internal Medicine

## 2013-05-30 DIAGNOSIS — Z1231 Encounter for screening mammogram for malignant neoplasm of breast: Secondary | ICD-10-CM

## 2013-12-19 DIAGNOSIS — Z23 Encounter for immunization: Secondary | ICD-10-CM | POA: Diagnosis not present

## 2013-12-19 DIAGNOSIS — K529 Noninfective gastroenteritis and colitis, unspecified: Secondary | ICD-10-CM | POA: Diagnosis not present

## 2013-12-19 DIAGNOSIS — I872 Venous insufficiency (chronic) (peripheral): Secondary | ICD-10-CM | POA: Diagnosis not present

## 2013-12-19 DIAGNOSIS — E785 Hyperlipidemia, unspecified: Secondary | ICD-10-CM | POA: Diagnosis not present

## 2013-12-19 DIAGNOSIS — D649 Anemia, unspecified: Secondary | ICD-10-CM | POA: Diagnosis not present

## 2013-12-19 DIAGNOSIS — E1149 Type 2 diabetes mellitus with other diabetic neurological complication: Secondary | ICD-10-CM | POA: Diagnosis not present

## 2013-12-19 DIAGNOSIS — E039 Hypothyroidism, unspecified: Secondary | ICD-10-CM | POA: Diagnosis not present

## 2013-12-19 DIAGNOSIS — J45909 Unspecified asthma, uncomplicated: Secondary | ICD-10-CM | POA: Diagnosis not present

## 2014-01-05 ENCOUNTER — Encounter: Payer: Self-pay | Admitting: Physician Assistant

## 2014-01-14 ENCOUNTER — Ambulatory Visit (INDEPENDENT_AMBULATORY_CARE_PROVIDER_SITE_OTHER): Payer: Medicare Other | Admitting: Physician Assistant

## 2014-01-14 ENCOUNTER — Other Ambulatory Visit (INDEPENDENT_AMBULATORY_CARE_PROVIDER_SITE_OTHER): Payer: Medicare Other

## 2014-01-14 ENCOUNTER — Encounter: Payer: Self-pay | Admitting: Physician Assistant

## 2014-01-14 VITALS — BP 112/78 | HR 100 | Ht 65.0 in | Wt 160.0 lb

## 2014-01-14 DIAGNOSIS — R110 Nausea: Secondary | ICD-10-CM

## 2014-01-14 DIAGNOSIS — R1084 Generalized abdominal pain: Secondary | ICD-10-CM

## 2014-01-14 DIAGNOSIS — R197 Diarrhea, unspecified: Secondary | ICD-10-CM

## 2014-01-14 DIAGNOSIS — R194 Change in bowel habit: Secondary | ICD-10-CM

## 2014-01-14 LAB — BASIC METABOLIC PANEL
BUN: 13 mg/dL (ref 6–23)
CHLORIDE: 104 meq/L (ref 96–112)
CO2: 27 meq/L (ref 19–32)
Calcium: 9.4 mg/dL (ref 8.4–10.5)
Creatinine, Ser: 0.8 mg/dL (ref 0.4–1.2)
GFR: 80.28 mL/min (ref >60.00)
Glucose, Bld: 67 mg/dL — ABNORMAL LOW (ref 70–99)
POTASSIUM: 4.5 meq/L (ref 3.5–5.1)
SODIUM: 140 meq/L (ref 135–145)

## 2014-01-14 LAB — C-REACTIVE PROTEIN

## 2014-01-14 LAB — SEDIMENTATION RATE: Sed Rate: 29 mm/hr — ABNORMAL HIGH (ref 0–22)

## 2014-01-14 NOTE — Progress Notes (Signed)
Reviewed and agree. ? Visceral neuropathy  Just bacterial overgroth diarrhea. Agree with CT scan. R/O microscopi ccolitis on colonoscopy.

## 2014-01-14 NOTE — Progress Notes (Signed)
Patient ID: Jessica Farrell, female   DOB: [birth year], 68 y.o.   MRN: [id number]   Subjective:    Patient ID: Jessica Farrell, female    DOB: [birth year], 68 y.o.   MRN: [id number]  HPI Jessica Farrell is a pleasant 68 year old white female new to GI today referred by Dr. Odette Farrell. She has not had any previous GI evaluation, or colonoscopy. She was diagnosed with diabetes in 2014 and has been on Glucophage. Also has history of hypothyroidism, obesity and hyperlipidemia. She states at this time she has been having abdominal pain and change in her bowel habits over the past 5-6 weeks. She says she has a a dull mid abdominal pain that never goes away and then intermittent. Sharp intense pain switch, and go throughout the day. She says she didn't sleep well last night due to abdominal pain. Her pain is located in the mid abdomen without radiation. She does not feel that the pain is exacerbated necessarily by eating. She has had a lot of gurgling and increased noise in her abdomen. She has had 2 discrete episodes of nausea and vomiting. No fever or chills. Appetite has been okay weight is down about 40 pounds over the past year and a half but she attributes that to diagnosis of diabetes and change in her diet. Over the past month or so she is having persistent loose stools with at least 3-4 bowel movements per day stools have been dark at times but no obvious blood. She says one day earlier this week she had 3 episodes of diarrhea after lunch. She's not been on any recent antibiotics, no known exposures travel etc. no new medications. No regular aspirin or NSAIDs. Family history is negative for IBD and colon cancer. Labs were checked recently through Dr. Cletis Farrell office with CBC and CMET unremarkable.  Review of Systems Pertinent positive and negative review of systems were noted in the above HPI section.  All other review of systems was otherwise negative.  Outpatient Encounter Prescriptions as of 01/14/2014    Medication Sig  . Albuterol Sulfate 108 (90 BASE) MCG/ACT AEPB Inhale 2 puffs into the lungs every 4 (four) hours as needed.  Marland Kitchen atorvastatin (LIPITOR) 10 MG tablet Take 10 mg by mouth daily.  Marland Kitchen levothyroxine (SYNTHROID, LEVOTHROID) 25 MCG tablet Take 1 tablet (25 mcg total) by mouth daily before breakfast.  . metFORMIN (GLUCOPHAGE) 500 MG tablet Take 1 tablet (500 mg total) by mouth 2 (two) times daily with a meal.  . [DISCONTINUED] Misc Natural Products (FLEX-A-MIN DOUBLE STRENGTH PO) Take by mouth daily.  . [DISCONTINUED] montelukast (SINGULAIR) 10 MG tablet Take 10 mg by mouth daily.  . [DISCONTINUED] Multiple Vitamins-Minerals (MULTIVITAMIN WITH MINERALS) tablet Take 1 tablet by mouth daily.  . [DISCONTINUED] pseudoephedrine-guaifenesin (MUCINEX D) 60-600 MG per tablet Take 1 tablet by mouth daily.  . [DISCONTINUED] psyllium (REGULOID) 0.52 G capsule Take 0.52 g by mouth daily.  . [DISCONTINUED] ranitidine (ZANTAC) 150 MG capsule Take 150 mg by mouth 2 (two) times daily.  . [DISCONTINUED] sulfamethoxazole-trimethoprim (BACTRIM DS) 800-160 MG per tablet Take 1 tablet by mouth every 12 (twelve) hours.  . [DISCONTINUED] albuterol (PROVENTIL HFA;VENTOLIN HFA) 108 (90 BASE) MCG/ACT inhaler Inhale 2 puffs into the lungs every 4 (four) hours as needed for wheezing.  . [DISCONTINUED] Aloe Vera GEL Apply 1 application topically daily.  . [DISCONTINUED] atorvastatin (LIPITOR) 10 MG tablet Take 1 tablet (10 mg total) by mouth daily.  . [DISCONTINUED] cetirizine (ZYRTEC) 10 MG tablet Take 10 mg by mouth  daily.  . [DISCONTINUED] clotrimazole (LOTRIMIN) 1 % cream Apply topically 2 (two) times daily. Apply in between toes  . [DISCONTINUED] doxycycline (VIBRA-TABS) 100 MG tablet   . [DISCONTINUED] fish oil-omega-3 fatty acids 1000 MG capsule Take 2 g by mouth daily.  . [DISCONTINUED] GLUCOSAMINE PO Take 1 tablet by mouth daily.  . [DISCONTINUED] hydrocortisone (ANUSOL-HC) 25 MG suppository Place 1  suppository (25 mg total) rectally 2 (two) times daily.  . [DISCONTINUED] Iron-Vitamin C (VITRON-C) 65-125 MG TABS Take by mouth daily.   Allergies  Allergen Reactions  . Penicillins Rash   Patient Active Problem List   Diagnosis Date Noted  . Pain in limb 08/02/2012  . Chronic venous insufficiency 08/02/2012  . Cellulitis of left lower leg 05/05/2012  . Diabetes mellitus 05/05/2012  . Dehydration 05/05/2012  . AKI (acute kidney injury) 05/05/2012  . Hyponatremia 05/05/2012  . Hypothyroidism 05/05/2012  . UTI (lower urinary tract infection) 05/05/2012  . Asthma 05/05/2012   History   Social History  . Marital Status: Divorced    Spouse Name: N/A    Number of Children: N/A  . Years of Education: N/A   Occupational History  . Not on file.   Social History Main Topics  . Smoking status: Never Smoker   . Smokeless tobacco: Never Used  . Alcohol Use: No  . Drug Use: No  . Sexual Activity: Not Currently    Birth Control/ Protection: Post-menopausal   Other Topics Concern  . Not on file   Social History Narrative    Jessica Farrell's family history includes Alzheimer's disease in her father and sister; Arthritis in her mother; Heart disease in her brother and mother; Hypertension in her mother.      Objective:    Filed Vitals:   01/14/14 0939  BP: 112/78  Pulse: 100    Physical Exam  well-developed older white female in no acute distress height 5 foot 5 weight 160. HEENT ;nontraumatic normocephalic EOMI PERRLA sclera anicteric, Supple; no JVD, Cardiovascular ;regular rate and rhythm with S1-S2 no murmur or gallop, Pulmonary; clear bilaterally, Abdomen; soft some mild tenderness in the mid abdomen there is no guarding or rebound no palpable mass or hepatosplenomegaly, bowel sounds are present, Rectal; exam stool is brown and Hemoccult negative, Extremities ;no clubbing cyanosis or edema skin warm dry, Psych; mood and affect appropriate     Assessment & Plan:   #27  68 year old female with 5-6 week history of mid and generalized abdominal discomfort with intermittent sharp intense abdominal pain. 2 episodes of nausea and vomiting, and persistent  daily diarrhea with 3-5 bowel movements per day. Etiology of her symptoms is not clear, doubt infectious due to duration of symptoms. Rule out inflammatory i.e. new onset of IBD or microscopic colitis, rule out occult lesion with partial obstruction.  #2 adult-onset diabetes mellitus on oral agents #3 hypothyroidism # 4 hyperlipidemia  Plan; check CRP and sedimentation rate, BMET GI pathogen panel It over CT scan of the abdomen and pelvis with contrast, this will be done early next week Schedule for colonoscopy with Dr. Olevia Perches. Discussed in detail with the patient and she is agreeable to proceed. Add  Prilosec 40 mg by mouth every morning Add trial of Bentyl 10 mg 3 times daily half hour before meals Further plans pending results of above   Alfredia Ferguson PA-C 01/14/2014

## 2014-01-14 NOTE — Patient Instructions (Signed)
Please go to the basement level to have your labs drawn and a stool study. Take Prilosec 40 mg, 1 capsule in the morning. [facility]cription sent to [facility]. Take Bentyl 10 mg 3 times daily for cramping and pain. Prescription sent to [facility]  You have been scheduled for a colonoscopy. Please follow written instructions given to you at your visit today.  Please pick up your prep kit at the pharmacy within the next 1-3 days. If you use inhalers (even only as needed), please bring them with you on the day of your procedure. Your physician has requested that you go to www.startemmi.com and enter the access code given to you at your visit today. This web site gives a general overview about your procedure. However, you should still follow specific instructions given to you by our office regarding your preparation for the procedure.   You have been scheduled for a CT scan of the abdomen and pelvis at [facility] ([address]---this is in the same building as [facility]).   You are scheduled on 01-21-2014 at 1:30 pm  . You should arrive 15 minutes prior to your appointment time for registration. Please follow the written instructions below on the day of your exam:  WARNING: IF YOU ARE ALLERGIC TO IODINE/X-RAY DYE, PLEASE NOTIFY RADIOLOGY IMMEDIATELY AT [phone]! YOU WILL BE GIVEN A 13 HOUR PREMEDICATION PREP.  1) Do not eat or drink anything after 7:00 am  (4 hours prior to your test) 2) You have been given 2 bottles of oral contrast to drink. The solution may taste  better if refrigerated, but do NOT add ice or any other liquid to this solution. Shake well before drinking.    Drink 1 bottle of contrast @ 11:30 am  (2 hours prior to your exam)  Drink 1 bottle of contrast @ 12:30 pm  (1 hour prior to your exam)  You may take any medications as prescribed with a small amount of water except for the following: Metformin, Glucophage, Glucovance,  Avandamet, Riomet, Fortamet, Actoplus Met, Janumet, Glumetza or Metaglip. The above medications must be held the day of the exam AND 48 hours after the exam.  The purpose of you drinking the oral contrast is to aid in the visualization of your intestinal tract. The contrast solution may cause some diarrhea. Before your exam is started, you will be given a small amount of fluid to drink. Depending on your individual set of symptoms, you may also receive an intravenous injection of x-ray contrast/dye. Plan on being at [facility] for 30 minutes or long, depending on the type of exam you are having performed.  If you have any questions regarding your exam or if you need to reschedule, you may call the CT department at [phone] between the hours of 8:00 am and 5:00 pm, Monday-Friday.  ________________________________________________________________________

## 2014-01-19 ENCOUNTER — Other Ambulatory Visit: Payer: Self-pay

## 2014-01-19 MED ORDER — DICYCLOMINE HCL 10 MG PO CAPS: ORAL_CAPSULE | ORAL | Status: DC

## 2014-01-19 MED ORDER — OMEPRAZOLE 40 MG PO CPDR: 40.0000 mg | DELAYED_RELEASE_CAPSULE | Freq: Every day | ORAL | Status: DC

## 2014-01-19 MED ORDER — MOVIPREP 100 G PO SOLR: 1.0000 | Freq: Once | ORAL | Status: DC

## 2014-01-20 ENCOUNTER — Other Ambulatory Visit: Payer: Medicare Other

## 2014-01-20 DIAGNOSIS — R198 Other specified symptoms and signs involving the digestive system and abdomen: Secondary | ICD-10-CM | POA: Diagnosis not present

## 2014-01-20 DIAGNOSIS — R194 Change in bowel habit: Secondary | ICD-10-CM | POA: Diagnosis not present

## 2014-01-20 DIAGNOSIS — R197 Diarrhea, unspecified: Secondary | ICD-10-CM

## 2014-01-20 DIAGNOSIS — R110 Nausea: Secondary | ICD-10-CM | POA: Diagnosis not present

## 2014-01-20 DIAGNOSIS — R1084 Generalized abdominal pain: Secondary | ICD-10-CM | POA: Diagnosis not present

## 2014-01-21 ENCOUNTER — Telehealth: Payer: Self-pay

## 2014-01-21 ENCOUNTER — Ambulatory Visit (INDEPENDENT_AMBULATORY_CARE_PROVIDER_SITE_OTHER)
Admission: RE | Admit: 2014-01-21 | Discharge: 2014-01-21 | Disposition: A | Discharge status: Home / Self Care | Payer: Medicare Other | Source: Ambulatory Visit | Attending: Physician Assistant | Admitting: Physician Assistant

## 2014-01-21 DIAGNOSIS — R1084 Generalized abdominal pain: Secondary | ICD-10-CM

## 2014-01-21 DIAGNOSIS — R197 Diarrhea, unspecified: Secondary | ICD-10-CM

## 2014-01-21 DIAGNOSIS — K5660 Unspecified intestinal obstruction: Secondary | ICD-10-CM | POA: Diagnosis not present

## 2014-01-21 DIAGNOSIS — R194 Change in bowel habit: Secondary | ICD-10-CM | POA: Diagnosis not present

## 2014-01-21 DIAGNOSIS — R110 Nausea: Secondary | ICD-10-CM | POA: Diagnosis not present

## 2014-01-21 LAB — GASTROINTESTINAL PATHOGEN PANEL PCR
C. DIFFICILE TOX A/B, PCR: NEGATIVE
Campylobacter, PCR: NEGATIVE
Cryptosporidium, PCR: NEGATIVE
E COLI (STEC) STX1/STX2, PCR: NEGATIVE
E COLI 0157, PCR: NEGATIVE
E coli (ETEC) LT/ST PCR: NEGATIVE
GIARDIA LAMBLIA, PCR: NEGATIVE
Norovirus, PCR: NEGATIVE
Rotavirus A, PCR: NEGATIVE
SALMONELLA, PCR: NEGATIVE
Shigella, PCR: NEGATIVE

## 2014-01-21 IMAGING — CT CT ABD-PELV W/ CM
2 of 5 series · 16 of 46 positions shown, 18 images · IV contrast (omnipaque)
Comparison: None.

CLINICAL DATA: Generalized abdominal pain. Diarrhea. Nausea without
vomiting. Change in bowel habits.

EXAM:
CT ABDOMEN AND PELVIS WITH CONTRAST
TECHNIQUE: Multidetector CT imaging of the abdomen and pelvis was performed
using the standard protocol following bolus administration of
intravenous contrast.
CONTRAST:  100 mL Omnipaque 300

[Series 2: abd/ pel 5mm · axial · 0.68mm/px · z∈[-483,-98]mm · 13 of 87 slices shown, 15 images]
[im 5/87  soft-tissue]
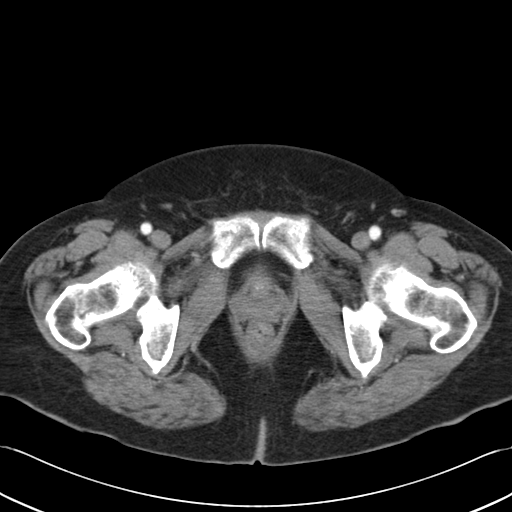
[im 5/87  bone]
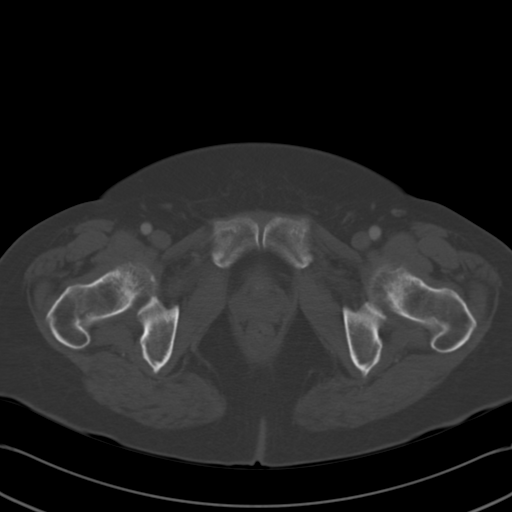
[im 14/87  soft-tissue]
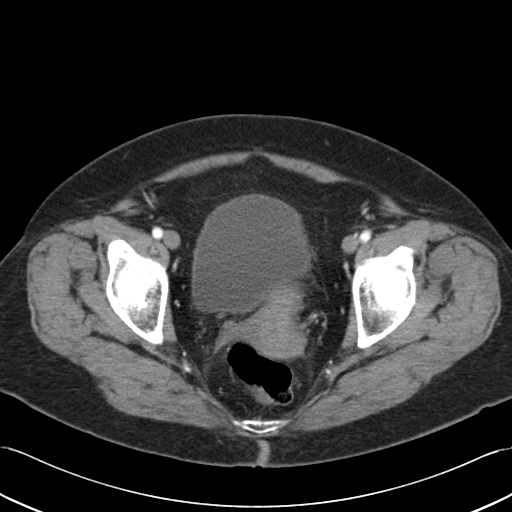
[im 19/87  soft-tissue]
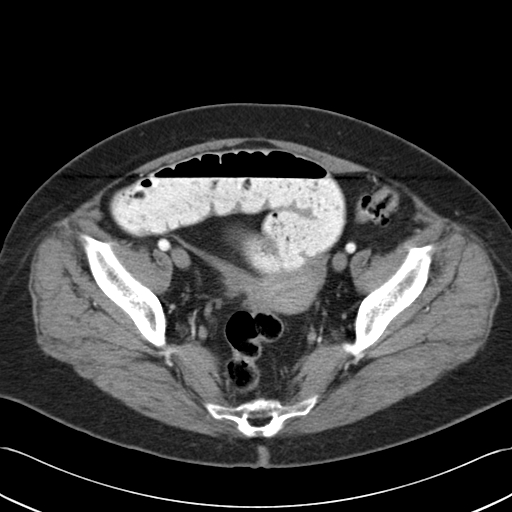
[im 23/87  soft-tissue]
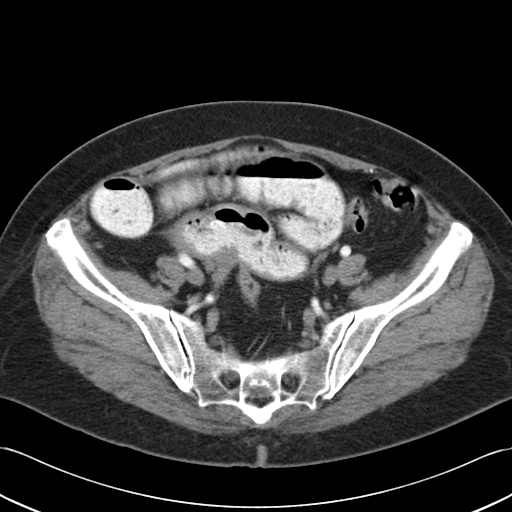
[im 32/87  soft-tissue]
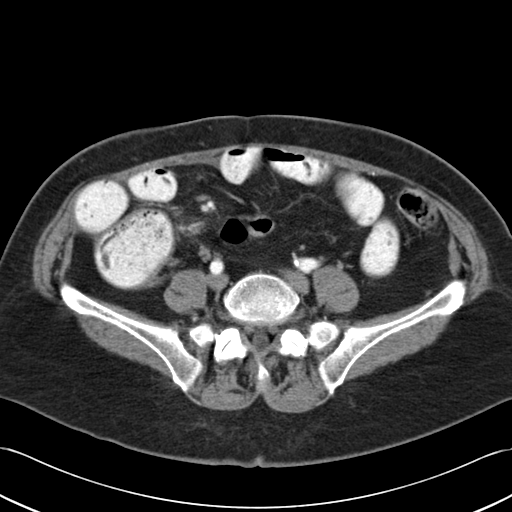
[im 37/87  soft-tissue]
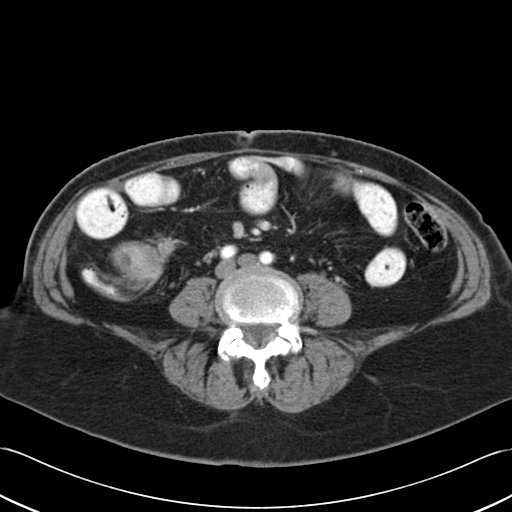
[im 46/87  soft-tissue]
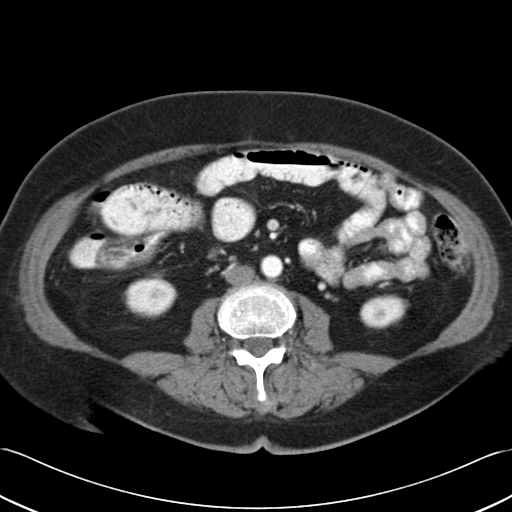
[im 50/87  soft-tissue]
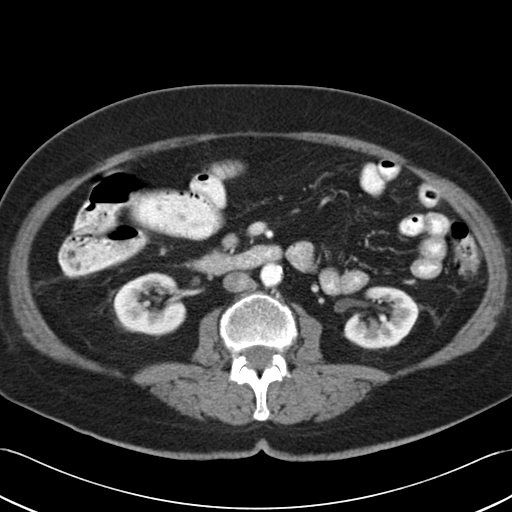
[im 55/87  soft-tissue]
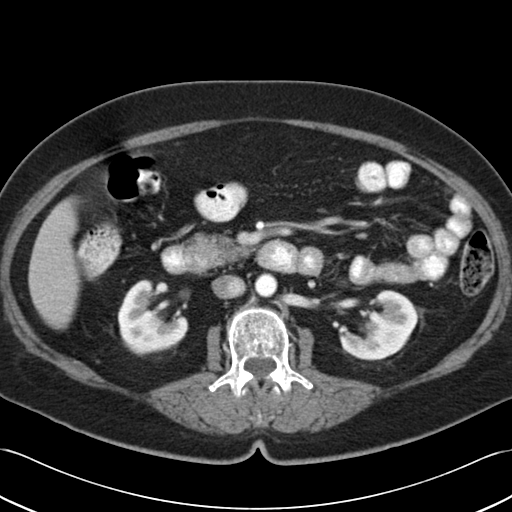
[im 55/87  bone]
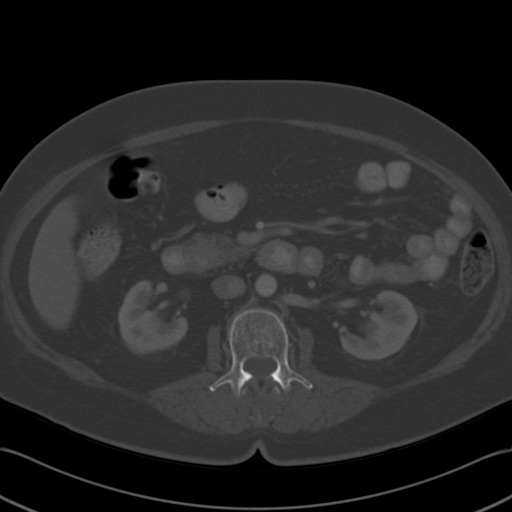
[im 64/87  soft-tissue]
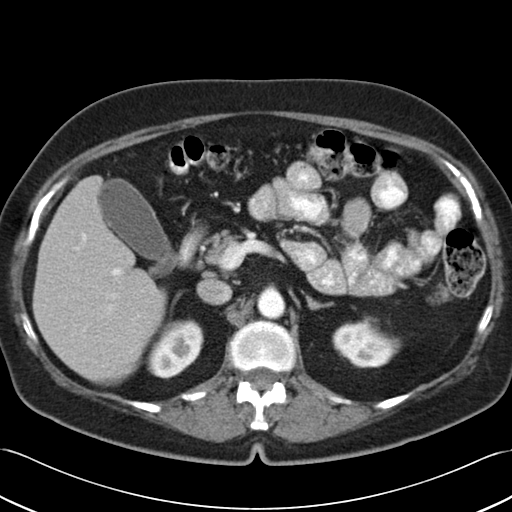
[im 68/87  soft-tissue]
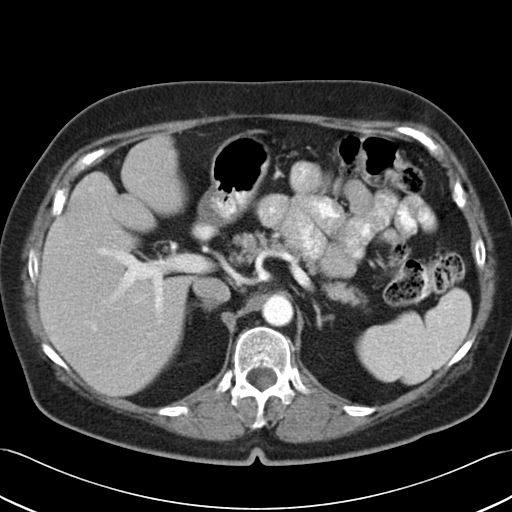
[im 73/87  soft-tissue]
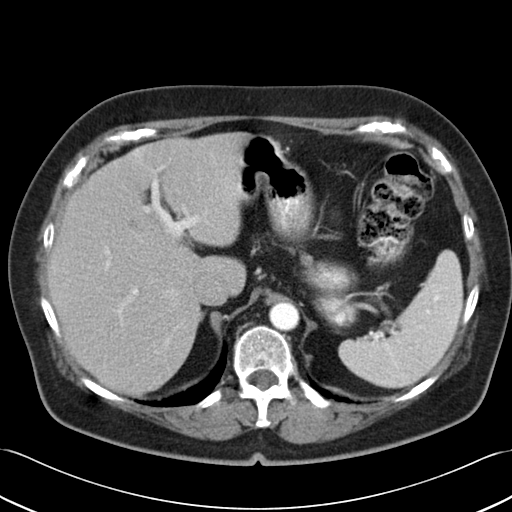
[im 82/87  soft-tissue]
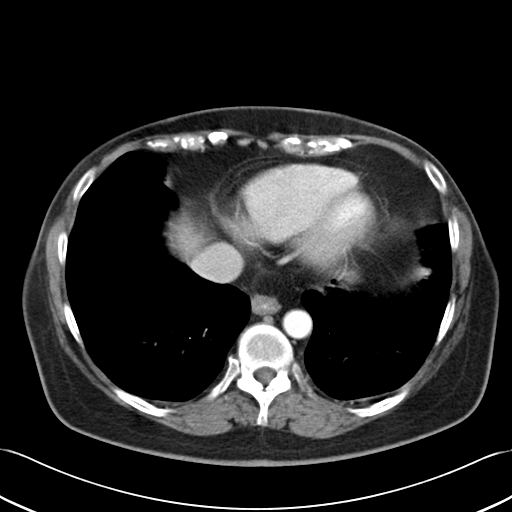

[Series 602: coronals · coronal · 0.88mm/px · 3 of 84 slices shown]
[im 28/84  soft-tissue]
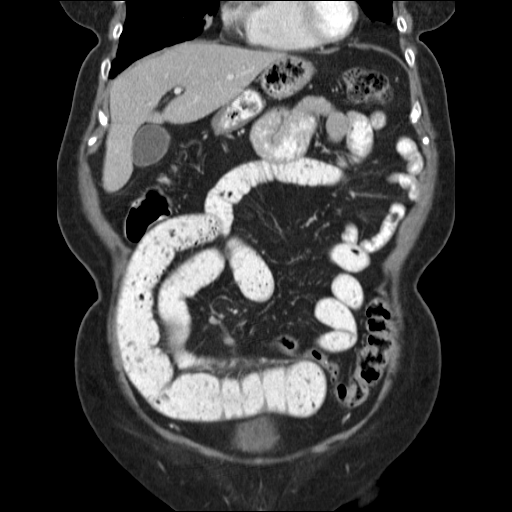
[im 37/84  soft-tissue]
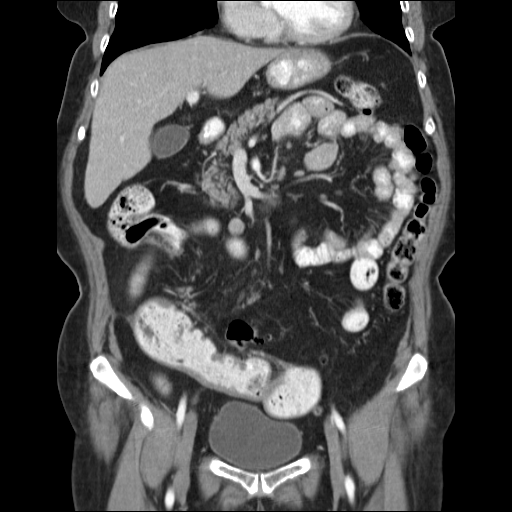
[im 47/84  soft-tissue]
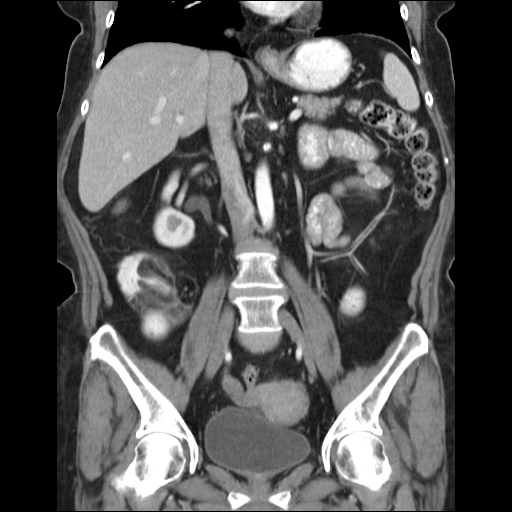

[16 of 46 positions shown; findings below may reference images not displayed]

FINDINGS: Lower Chest:  Unremarkable.

Hepatobiliary: No masses or other significant abnormality
identified. Gallbladder is unremarkable.

Pancreas: No mass, inflammatory changes, or other parenchymal
abnormality identified.

Spleen:  Within normal limits in size and appearance.

Adrenal Glands:  No mass identified.

Kidneys/Urinary Tract: No masses identified. No evidence of
hydronephrosis.

Stomach/Bowel/Peritoneum: Mild to moderate dilatation of distal
small bowel loops seen within the lower abdomen and pelvis.
Transition point is seen at site of ill-defined soft tissue mass
near the ileocecal valve which measures 2.6 x 3.4 cm on image 52 of
series 2. There is mild adjacent lymphadenopathy within the right
lower quadrant mesentery with largest lymph node measuring 12 mm on
image 50 of series 2. Differential diagnosis includes adenocarcinoma
and carcinoid tumor.

Sigmoid diverticulosis noted, without evidence of diverticulitis.

Vascular/Lymphatic: No pathologically enlarged lymph nodes
identified. No other significant abnormality identified.

Reproductive:  No mass or other significant abnormality identified.

Other:  None.

Musculoskeletal:  No suspicious bone lesions identified.
IMPRESSION: Distal small bowel obstruction due to 3.4 cm soft tissue mass near
the ileocecal valve, with adjacent right lower quadrant mesenteric
lymphadenopathy. Differential diagnosis includes adenocarcinoma and
carcinoid tumor.

No evidence of distant metastatic disease.

Sigmoid diverticulosis, without evidence of diverticulitis.

These results will be called to the ordering clinician or
representative by the Radiologist Assistant, and communication
documented in the PACS or zVision Dashboard.

## 2014-01-21 MED ORDER — IOHEXOL 300 MG/ML  SOLN
100.0000 mL | Freq: Once | INTRAMUSCULAR | Status: AC | PRN
  Administered 2014-01-21: 100 mL via INTRAVENOUS

## 2014-01-21 NOTE — Discharge Instructions (Signed)
Metformin and X-ray Contrast Studies °For some X-ray exams, a contrast dye is used. Contrast dye is a type of medicine used to make the X-ray image clearer. The contrast dye is given to the patient through a vein (intravenously). If you need to have this type of X-ray exam and you take a medication called metformin, your caregiver may have you stop taking metformin before the exam.  °LACTIC ACIDOSIS °In rare cases, a serious medical condition called lactic acidosis can develop in people who take metformin and receive contrast dye. The following conditions can increase the risk of this complication:  °· Kidney failure. °· Liver problems. °· Certain types of heart problems such as: °¨ Heart failure. °¨ Heart attack. °¨ Heart infection. °¨ Heart valve problems. °· Alcohol abuse. °If left untreated, lactic acidosis can lead to coma.  °SYMPTOMS OF LACTIC ACIDOSIS °Symptoms of lactic acidosis can include: °· Rapid breathing (hyperventilation). °· Neurologic symptoms such as: °¨ Headaches. °¨ Confusion. °¨ Dizziness. °· Excessive sweating. °· Feeling sick to your stomach (nauseous) or throwing up (vomiting). °AFTER THE X-RAY EXAM °· Stay well-hydrated. Drink fluids as instructed by your caregiver. °· If you have a risk of developing lactic acidosis, blood tests may be done to make sure your kidney function is okay. °· Metformin is usually stopped for 48 hours after the X-ray exam. Ask your caregiver when you can start taking metformin again. °SEEK MEDICAL CARE IF:  °· You have shortness of breath or difficulty breathing. °· You develop a headache that does not go away. °· You have nausea or vomiting. °· You urinate more than normal. °· You develop a skin rash and have: °¨ Redness. °¨ Swelling. °¨ Itching. °Document Released: 01/25/2009 Document Revised: 05/01/2011 Document Reviewed: 01/25/2009 °ExitCare® Patient Information ©2015 [employer]. This information is not intended to replace advice given to you by your health  care provider. Make sure you discuss any questions you have with your health care provider. ° °

## 2014-01-21 NOTE — Telephone Encounter (Signed)
Spoke with the patient. Advised she needs to come in and discuss the CT results. Patient agrees to an appointment 01/22/14 at 3pm.

## 2014-01-21 NOTE — Telephone Encounter (Signed)
-----   Message from Alfredia Ferguson, PA-C sent at 01/21/2014  3:39 PM EST ----- Regarding: needs to come in [place], can you please call pt and let her know the CT scan shows some abnormalities which I would like to discuss with her- see if she can come in to office tomorrow afternoon/Thursday -I have some openings... Thanks

## 2014-01-22 ENCOUNTER — Encounter: Payer: Self-pay | Admitting: Physician Assistant

## 2014-01-22 ENCOUNTER — Ambulatory Visit (INDEPENDENT_AMBULATORY_CARE_PROVIDER_SITE_OTHER): Payer: Medicare Other | Admitting: Physician Assistant

## 2014-01-22 ENCOUNTER — Other Ambulatory Visit: Payer: Medicare Other

## 2014-01-22 VITALS — BP 100/64 | HR 88 | Ht 63.25 in | Wt 160.5 lb

## 2014-01-22 DIAGNOSIS — IMO0002 Reserved for concepts with insufficient information to code with codable children: Secondary | ICD-10-CM

## 2014-01-22 DIAGNOSIS — R9389 Abnormal findings on diagnostic imaging of other specified body structures: Secondary | ICD-10-CM

## 2014-01-22 DIAGNOSIS — R229 Localized swelling, mass and lump, unspecified: Secondary | ICD-10-CM | POA: Diagnosis not present

## 2014-01-22 DIAGNOSIS — R938 Abnormal findings on diagnostic imaging of other specified body structures: Secondary | ICD-10-CM | POA: Diagnosis not present

## 2014-01-22 MED ORDER — ONDANSETRON HCL 4 MG PO TABS: ORAL_TABLET | ORAL | Status: DC

## 2014-01-22 MED ORDER — TRAMADOL HCL 50 MG PO TABS: 50.0000 mg | ORAL_TABLET | Freq: Four times a day (QID) | ORAL | Status: DC | PRN

## 2014-01-22 NOTE — Patient Instructions (Signed)
You have been scheduled for a colonoscopy. Please follow written instructions given to you at your visit today.  We sent prescription for Zofran and Ultram 50 mg. If you use inhalers (even only as needed), please bring them with you on the day of your procedure. Your physician has requested that you go to www.startemmi.com and enter the access code given to you at your visit today. This web site gives a general overview about your procedure. However, you should still follow specific instructions given to you by our office regarding your preparation for the procedure.

## 2014-01-22 NOTE — Progress Notes (Signed)
I agree with going ahead with surgical consult.

## 2014-01-22 NOTE — Progress Notes (Signed)
Patient ID: Jessica Farrell, female   DOB: 1945/02/22, 68 y.o.   MRN: [id number]   Subjective:    Patient ID: Jessica Farrell, female    DOB: 08-15-1945, 68 y.o.   MRN: [id number]  HPI [name] is very nice 68 year old white female who was initially seen here in consult last week referral from Dr. Johann Capers back with 5-6 week history of mid and generalized abdominal discomfort with episodes of sharp intense abdominal pain a couple of episodes of nausea and vomiting and new onset of loose stools generally postprandially. GI pathogen panel was negative. Sedimentation rate was slightly elevated CT of the abdomen was done yesterday and unfortunately shows a 2.6 x 3.4 cm soft tissue mass at the ileocecal valve there is mild adjacent adenopathy. There is also moderate dilation of the distal small bowel loops. Unclear whether this represents an adenocarcinoma or possibly a carcinoid. Patient was brought back to the office today to discuss and reassess her .She had previously been scheduled for colonoscopy with Dr. Olevia Perches on December 18. Patient says she has continued to have episodes of "hard" pains in her mid abdomen and continues with loose stools postprandially. She's had nausea no vomiting. She says after the CT scan yesterday she had a lot of diarrhea and actually does seem to resolve some of the sharp cramping she had been having.   Review of Systems Pertinent positive and negative review of systems were noted in the above HPI section.  All other review of systems was otherwise negative.  Outpatient Encounter Prescriptions as of 01/22/2014  Medication Sig  . Albuterol Sulfate 108 (90 BASE) MCG/ACT AEPB Inhale 2 puffs into the lungs every 4 (four) hours as needed.  Marland Kitchen atorvastatin (LIPITOR) 10 MG tablet Take 10 mg by mouth daily.  Marland Kitchen dicyclomine (BENTYL) 10 MG capsule Take 3 times daily as needed for abdominal pain  . levothyroxine (SYNTHROID, LEVOTHROID) 25 MCG tablet Take 1 tablet (25 mcg total) by mouth  daily before breakfast.  . metFORMIN (GLUCOPHAGE) 500 MG tablet Take 1 tablet (500 mg total) by mouth 2 (two) times daily with a meal.  . omeprazole (PRILOSEC) 40 MG capsule Take 1 capsule (40 mg total) by mouth daily.  . ondansetron (ZOFRAN) 4 MG tablet Take 1 tab every 6 hours as needed for nausea.  . traMADol (ULTRAM) 50 MG tablet Take 1 tablet (50 mg total) by mouth every 6 (six) hours as needed.  . [DISCONTINUED] MOVIPREP 100 G SOLR Take 1 kit (200 g total) by mouth once.  . [DISCONTINUED] traMADol (ULTRAM) 50 MG tablet Take 1 tablet (50 mg total) by mouth every 6 (six) hours as needed.   Allergies  Allergen Reactions  . Penicillins Rash   Patient Active Problem List   Diagnosis Date Noted  . Pain in limb 08/02/2012  . Chronic venous insufficiency 08/02/2012  . Cellulitis of left lower leg 05/05/2012  . Diabetes mellitus 05/05/2012  . Dehydration 05/05/2012  . AKI (acute kidney injury) 05/05/2012  . Hyponatremia 05/05/2012  . Hypothyroidism 05/05/2012  . UTI (lower urinary tract infection) 05/05/2012  . Asthma 05/05/2012   History   Social History  . Marital Status: Divorced    Spouse Name: N/A    Number of Children: N/A  . Years of Education: N/A   Occupational History  . Not on file.   Social History Main Topics  . Smoking status: Never Smoker   . Smokeless tobacco: Never Used  . Alcohol Use: No  . Drug Use: No  .  Sexual Activity: Not Currently    Birth Control/ Protection: Post-menopausal   Other Topics Concern  . Not on file   Social History Narrative    Ms. Klus's family history includes Alzheimer's disease in her father and sister; Arthritis in her mother; Heart disease in her brother and mother; Hypertension in her mother.      Objective:    Filed Vitals:   01/22/14 1504  BP: 100/64  Pulse: 88    Physical Exam  well-developed older white female in no acute distress height 5 foot 3 weight 160. HEENT nontraumatic normocephalic EOMI PERRLA  anicteric, Supple no JVD, Cardiovascular regular rate and rhythm with S1-S2 no murmur or gallop, Pulmonary clear bilaterally, Abdomen soft nondistended sounds are active no significant tenderness no guarding or rebound no pal pable mass or hepatosplenomegaly, Rectal exam not done, Neuropsych mood and affect appropriate     Assessment & Plan:   #1  68 yo female with recent onset of episodes of sharp abdominal pain intermittent nausea and vomiting and postprandial diarrhea. CT scan shows a mass at the ileocecal valve with adjacent adenopathy and some mildly dilated distal small bowel loops. Per radiologist question adenocarcinoma versus carcinoid. Patient symptoms have not worsened over the past week. We had a long discussion today regarding the CT findings  Plan; check CEA today Patient's colonoscopy will be moved up to next week on December 19 with Dr. Delfin Edis. I think she will be able to do a bowel prep especially with split dose. She will takes Zofran 4 mg by mouth prior to starting her prep and have also given a prescription for when necessary Zofran She asks for some pain medication and we'll try Ultram 50 mg every 6 hours when necessary Low residue soft diet Very likely that this mass will need to be resected and will initiate surgical referral so that she can be seen shortly after the colonoscopy.   Amy S Esterwood PA-C 01/22/2014

## 2014-01-23 ENCOUNTER — Telehealth: Payer: Self-pay

## 2014-01-23 ENCOUNTER — Other Ambulatory Visit: Payer: Self-pay

## 2014-01-23 DIAGNOSIS — R1909 Other intra-abdominal and pelvic swelling, mass and lump: Secondary | ICD-10-CM

## 2014-01-23 LAB — CEA: CEA: 2 ng/mL (ref 0.0–5.0)

## 2014-01-23 NOTE — Telephone Encounter (Signed)
Records faxed to [facility]. Message left to confirm they were received.

## 2014-01-23 NOTE — Telephone Encounter (Signed)
-----   Message from Alfredia Ferguson, PA-C sent at 01/22/2014  5:14 PM EST ----- Eustaquio Maize. Please get pt an appt with [place] [facility] - has an ileocecal mass  Which will need to be resected. Dr. Olevia Perches doing colonoscopy next week on 12/9 so anytime after that is fine within next couple weeks. Thanks

## 2014-01-23 NOTE — Telephone Encounter (Signed)
Appointment scheduled for 02/12/14 arrive at 10:50 am. The patient agrees to this appointment.

## 2014-01-28 ENCOUNTER — Ambulatory Visit (AMBULATORY_SURGERY_CENTER): Payer: Medicare Other | Admitting: Internal Medicine

## 2014-01-28 ENCOUNTER — Encounter: Payer: Self-pay | Admitting: Internal Medicine

## 2014-01-28 VITALS — BP 124/69 | HR 74 | Temp 97.0°F | Resp 17 | Ht 69.0 in | Wt 160.0 lb

## 2014-01-28 DIAGNOSIS — K639 Disease of intestine, unspecified: Secondary | ICD-10-CM | POA: Diagnosis not present

## 2014-01-28 DIAGNOSIS — R933 Abnormal findings on diagnostic imaging of other parts of digestive tract: Secondary | ICD-10-CM | POA: Diagnosis not present

## 2014-01-28 DIAGNOSIS — R938 Abnormal findings on diagnostic imaging of other specified body structures: Secondary | ICD-10-CM

## 2014-01-28 DIAGNOSIS — R9389 Abnormal findings on diagnostic imaging of other specified body structures: Secondary | ICD-10-CM

## 2014-01-28 DIAGNOSIS — D122 Benign neoplasm of ascending colon: Secondary | ICD-10-CM | POA: Diagnosis not present

## 2014-01-28 DIAGNOSIS — E119 Type 2 diabetes mellitus without complications: Secondary | ICD-10-CM | POA: Diagnosis not present

## 2014-01-28 DIAGNOSIS — K6389 Other specified diseases of intestine: Secondary | ICD-10-CM

## 2014-01-28 DIAGNOSIS — E039 Hypothyroidism, unspecified: Secondary | ICD-10-CM | POA: Diagnosis not present

## 2014-01-28 LAB — GLUCOSE, CAPILLARY
GLUCOSE-CAPILLARY: 103 mg/dL — AB (ref 70–99)
Glucose-Capillary: 102 mg/dL — ABNORMAL HIGH (ref 70–99)

## 2014-01-28 MED ORDER — SODIUM CHLORIDE 0.9 % IV SOLN: 500.0000 mL | INTRAVENOUS | Status: DC

## 2014-01-28 NOTE — Op Note (Signed)
[facility]  [address] [place], [id number]   COLONOSCOPY PROCEDURE REPORT  PATIENT: [name], [name]  MR#: [id number] BIRTHDATE: [birth year] , 68  yrs. old GENDER: female ENDOSCOPIST: Lafayette Dragon, MD REFERRED XB:[id number] Tisovec, M.D. PROCEDURE DATE:  01/28/2014 PROCEDURE:   Colonoscopy with biopsy and Colonoscopy with snare polypectomy First Screening Colonoscopy - Avg.  risk and is 50 yrs.  old or older Yes.  Prior Negative Screening - Now for repeat screening. N/A  History of Adenoma - Now for follow-up colonoscopy & has been > or = to 3 yrs.  N/A  Polyps Removed Today? Yes. ASA CLASS:   Class II INDICATIONS:abdominal pain.  CT scan shows a mass at the ileocecal valve. MEDICATIONS: Monitored anesthesia care and Propofol 500 mg IV  DESCRIPTION OF PROCEDURE:   After the risks benefits and alternatives of the procedure were thoroughly explained, informed consent was obtained.  The digital rectal exam revealed no abnormalities of the rectum.   The LB [id number]  endoscope was introduced through the anus and advanced to the ileum. No adverse events experienced.   The quality of the prep was good, using MoviPrep  The instrument was then slowly withdrawn as the colon was fully examined.      COLON FINDINGS: A circumferential fungating mass was found at the ileocecal valvelocated entirely within the ileocecal valve and partially obstructing the lumen. I was able to advance the scope through the ileocecal valve but could never reach the terminal ileum because of the obstructing nature of the mass. The mass was very soft spiculated N Spohn G it was very vascular and bled after biopsies were obtained.  Multiple biopsies were performed using cold forceps.   A soft, spiculated semi-pedunculated polyp measuring 9 mm in size with a friable surface was found in the ascending colon.just above the ileocecal valve and consisted of the same tissue found in the  ileocecal valve  A polypectomy was performed with a cold snare.  The resection was complete, the polyp tissue was completely retrieved and sent to histology.  Retroflexed views revealed no abnormalities. The time to cecum=6 minutes 30 seconds.  Withdrawal time=19 minutes 36 seconds.  The scope was withdrawn and the procedure completed. COMPLICATIONS: There were no immediate complications.  ENDOSCOPIC IMPRESSION: 1.   Circumferential mass was found within the ileocecal valve; partially obstructing the lumen ,multiple biopsies were performed using cold forceps distal ileum entered about it was partially obstructed due to a spongy appearing, soft, spiculated mass 2.   Semi-pedunculated polyp was found in the ascending colon; polypectomy was performed with a cold snare  RECOMMENDATIONS: 1.  Await pathology results, sent [facility] 2.  Surgical consult pending Rule out neuroendocrine tumor,  eSigned:  Lafayette Dragon, MD 01/28/2014 10:31 AM   cc:   PATIENT NAME:  Jessica Farrell, Jessica Farrell MR#: [id number]

## 2014-01-28 NOTE — Progress Notes (Signed)
Patient sleeping, report to rn, O2 nasal cannula 3 liters, vss

## 2014-01-28 NOTE — Patient Instructions (Signed)
YOU HAD AN ENDOSCOPIC PROCEDURE TODAY AT THE [facility]: Refer to the procedure report that was given to you for any specific questions about what was found during the examination.  If the procedure report does not answer your questions, please call your gastroenterologist to clarify.  If you requested that your care partner not be given the details of your procedure findings, then the procedure report has been included in a sealed envelope for you to review at your convenience later.  YOU SHOULD EXPECT: Some feelings of bloating in the abdomen. Passage of more gas than usual.  Walking can help get rid of the air that was put into your GI tract during the procedure and reduce the bloating. If you had a lower endoscopy (such as a colonoscopy or flexible sigmoidoscopy) you may notice spotting of blood in your stool or on the toilet paper. If you underwent a bowel prep for your procedure, then you may not have a normal bowel movement for a few days.  DIET: Your first meal following the procedure should be a light meal and then it is ok to progress to your normal diet.  A half-sandwich or bowl of soup is an example of a good first meal.  Heavy or fried foods are harder to digest and may make you feel nauseous or bloated.  Likewise meals heavy in dairy and vegetables can cause extra gas to form and this can also increase the bloating.  Drink plenty of fluids but you should avoid alcoholic beverages for 24 hours.  ACTIVITY: Your care partner should take you home directly after the procedure.  You should plan to take it easy, moving slowly for the rest of the day.  You can resume normal activity the day after the procedure however you should NOT DRIVE or use heavy machinery for 24 hours (because of the sedation medicines used during the test).    SYMPTOMS TO REPORT IMMEDIATELY: A gastroenterologist can be reached at any hour.  During normal business hours, 8:30 AM to 5:00 PM Monday through Friday,  call [phone].  After hours and on weekends, please call the GI answering service at [phone] who will take a message and have the physician on call contact you.   Following lower endoscopy (colonoscopy or flexible sigmoidoscopy):  Excessive amounts of blood in the stool  Significant tenderness or worsening of abdominal pains  Swelling of the abdomen that is new, acute  Fever of 100F or higher  FOLLOW UP: If any biopsies were taken you will be contacted by phone or by letter within the next 1-3 weeks.  Call your gastroenterologist if you have not heard about the biopsies in 3 weeks.  Our staff will call the home number listed on your records the next business day following your procedure to check on you and address any questions or concerns that you may have at that time regarding the information given to you following your procedure. This is a courtesy call and so if there is no answer at the home number and we have not heard from you through the emergency physician on call, we will assume that you have returned to your regular daily activities without incident.  SIGNATURES/CONFIDENTIALITY: You and/or your care partner have signed paperwork which will be entered into your electronic medical record.  These signatures attest to the fact that that the information above on your After Visit Summary has been reviewed and is understood.  Full responsibility of the confidentiality of this   discharge information lies with you and/or your care-partner.  Your biopsies were sent [facility] and Dr. Olevia Perches will get back to you ASAP about the results   Please continue your normal medications  Please follow low residue diet- see handout

## 2014-01-28 NOTE — Progress Notes (Signed)
Called to room to assist during endoscopic procedure.  Patient ID and intended procedure confirmed with present staff. Received instructions for my participation in the procedure from the performing physician.  

## 2014-01-29 ENCOUNTER — Telehealth: Payer: Self-pay | Admitting: *Deleted

## 2014-01-29 NOTE — Telephone Encounter (Signed)
  Follow up Call-  Call back number 01/28/2014  Post procedure Call Back phone  # [phone]  Permission to leave phone message No     Patient questions:  Do you have a fever, pain , or abdominal swelling? No. Pain Score  0 *  Have you tolerated food without any problems? Yes.    Have you been able to return to your normal activities? Yes.    Do you have any questions about your discharge instructions: Diet   No. Medications  No. Follow up visit  No.  Do you have questions or concerns about your Care? No.  Actions: * If pain score is 4 or above: No action needed, pain <4.

## 2014-01-30 ENCOUNTER — Telehealth: Payer: Self-pay | Admitting: Internal Medicine

## 2014-01-30 ENCOUNTER — Emergency Department (HOSPITAL_COMMUNITY): Payer: Medicare Other

## 2014-01-30 ENCOUNTER — Inpatient Hospital Stay (HOSPITAL_COMMUNITY)
Admission: EM | Admit: 2014-01-30 | Discharge: 2014-02-08 | DRG: 827 | Disposition: A | Discharge status: Home / Self Care | Payer: Medicare Other | Attending: Surgery | Admitting: Surgery

## 2014-01-30 ENCOUNTER — Encounter (HOSPITAL_COMMUNITY): Payer: Self-pay | Admitting: Emergency Medicine

## 2014-01-30 DIAGNOSIS — C7B8 Other secondary neuroendocrine tumors: Secondary | ICD-10-CM

## 2014-01-30 DIAGNOSIS — L03116 Cellulitis of left lower limb: Secondary | ICD-10-CM | POA: Diagnosis present

## 2014-01-30 DIAGNOSIS — E1169 Type 2 diabetes mellitus with other specified complication: Secondary | ICD-10-CM

## 2014-01-30 DIAGNOSIS — D490 Neoplasm of unspecified behavior of digestive system: Secondary | ICD-10-CM | POA: Diagnosis not present

## 2014-01-30 DIAGNOSIS — C7A1 Malignant poorly differentiated neuroendocrine tumors: Principal | ICD-10-CM | POA: Diagnosis present

## 2014-01-30 DIAGNOSIS — R1900 Intra-abdominal and pelvic swelling, mass and lump, unspecified site: Secondary | ICD-10-CM

## 2014-01-30 DIAGNOSIS — Z880 Allergy status to penicillin: Secondary | ICD-10-CM | POA: Diagnosis not present

## 2014-01-30 DIAGNOSIS — D3A022 Benign carcinoid tumor of the ascending colon: Secondary | ICD-10-CM | POA: Diagnosis not present

## 2014-01-30 DIAGNOSIS — E119 Type 2 diabetes mellitus without complications: Secondary | ICD-10-CM | POA: Diagnosis present

## 2014-01-30 DIAGNOSIS — N390 Urinary tract infection, site not specified: Secondary | ICD-10-CM | POA: Diagnosis not present

## 2014-01-30 DIAGNOSIS — Z820 Family history of epilepsy and other diseases of the nervous system: Secondary | ICD-10-CM | POA: Diagnosis not present

## 2014-01-30 DIAGNOSIS — J45909 Unspecified asthma, uncomplicated: Secondary | ICD-10-CM | POA: Diagnosis present

## 2014-01-30 DIAGNOSIS — R000 Tachycardia, unspecified: Secondary | ICD-10-CM | POA: Diagnosis present

## 2014-01-30 DIAGNOSIS — E039 Hypothyroidism, unspecified: Secondary | ICD-10-CM | POA: Diagnosis present

## 2014-01-30 DIAGNOSIS — Z8249 Family history of ischemic heart disease and other diseases of the circulatory system: Secondary | ICD-10-CM

## 2014-01-30 DIAGNOSIS — Z6827 Body mass index (BMI) 27.0-27.9, adult: Secondary | ICD-10-CM | POA: Diagnosis not present

## 2014-01-30 DIAGNOSIS — K5669 Other intestinal obstruction: Secondary | ICD-10-CM | POA: Diagnosis not present

## 2014-01-30 DIAGNOSIS — R599 Enlarged lymph nodes, unspecified: Secondary | ICD-10-CM | POA: Diagnosis present

## 2014-01-30 DIAGNOSIS — R1031 Right lower quadrant pain: Secondary | ICD-10-CM | POA: Diagnosis not present

## 2014-01-30 DIAGNOSIS — E669 Obesity, unspecified: Secondary | ICD-10-CM | POA: Diagnosis present

## 2014-01-30 DIAGNOSIS — D480 Neoplasm of uncertain behavior of bone and articular cartilage: Secondary | ICD-10-CM | POA: Diagnosis not present

## 2014-01-30 DIAGNOSIS — R1013 Epigastric pain: Secondary | ICD-10-CM | POA: Diagnosis not present

## 2014-01-30 DIAGNOSIS — C182 Malignant neoplasm of ascending colon: Secondary | ICD-10-CM | POA: Diagnosis not present

## 2014-01-30 DIAGNOSIS — R109 Unspecified abdominal pain: Secondary | ICD-10-CM

## 2014-01-30 DIAGNOSIS — E785 Hyperlipidemia, unspecified: Secondary | ICD-10-CM | POA: Diagnosis present

## 2014-01-30 DIAGNOSIS — K5660 Unspecified intestinal obstruction: Secondary | ICD-10-CM | POA: Diagnosis not present

## 2014-01-30 DIAGNOSIS — K56609 Unspecified intestinal obstruction, unspecified as to partial versus complete obstruction: Secondary | ICD-10-CM | POA: Diagnosis present

## 2014-01-30 DIAGNOSIS — R1907 Generalized intra-abdominal and pelvic swelling, mass and lump: Secondary | ICD-10-CM | POA: Diagnosis not present

## 2014-01-30 LAB — CBC WITH DIFFERENTIAL/PLATELET
BASOS ABS: 0 10*3/uL (ref 0.0–0.1)
Basophils Relative: 0 % (ref 0–1)
Eosinophils Absolute: 0 10*3/uL (ref 0.0–0.7)
Eosinophils Relative: 0 % (ref 0–5)
HCT: 41.4 % (ref 36.0–46.0)
HEMOGLOBIN: 13.8 g/dL (ref 12.0–15.0)
LYMPHS PCT: 27 % (ref 12–46)
Lymphs Abs: 2 10*3/uL (ref 0.7–4.0)
MCH: 29.4 pg (ref 26.0–34.0)
MCHC: 33.3 g/dL (ref 30.0–36.0)
MCV: 88.1 fL (ref 78.0–100.0)
MONOS PCT: 9 % (ref 3–12)
Monocytes Absolute: 0.7 10*3/uL (ref 0.1–1.0)
NEUTROS ABS: 4.8 10*3/uL (ref 1.7–7.7)
NEUTROS PCT: 64 % (ref 43–77)
Platelets: 255 10*3/uL (ref 150–400)
RBC: 4.7 MIL/uL (ref 3.87–5.11)
RDW: 13.1 % (ref 11.5–15.5)
WBC: 7.5 10*3/uL (ref 4.0–10.5)

## 2014-01-30 LAB — URINALYSIS, ROUTINE W REFLEX MICROSCOPIC
Bilirubin Urine: NEGATIVE
GLUCOSE, UA: NEGATIVE mg/dL
Hgb urine dipstick: NEGATIVE
Ketones, ur: NEGATIVE mg/dL
NITRITE: POSITIVE — AB
PH: 5.5 (ref 5.0–8.0)
Protein, ur: NEGATIVE mg/dL
SPECIFIC GRAVITY, URINE: 1.024 (ref 1.005–1.030)
Urobilinogen, UA: 0.2 mg/dL (ref 0.0–1.0)

## 2014-01-30 LAB — URINE MICROSCOPIC-ADD ON

## 2014-01-30 LAB — COMPREHENSIVE METABOLIC PANEL
ALK PHOS: 136 U/L — AB (ref 39–117)
ALT: 13 U/L (ref 0–35)
AST: 13 U/L (ref 0–37)
Albumin: 3.5 g/dL (ref 3.5–5.2)
Anion gap: 15 (ref 5–15)
BILIRUBIN TOTAL: 0.3 mg/dL (ref 0.3–1.2)
BUN: 15 mg/dL (ref 6–23)
CHLORIDE: 104 meq/L (ref 96–112)
CO2: 24 mEq/L (ref 19–32)
Calcium: 9.4 mg/dL (ref 8.4–10.5)
Creatinine, Ser: 0.71 mg/dL (ref 0.50–1.10)
GFR calc Af Amer: >90 mL/min (ref >90)
GFR calc non Af Amer: 87 mL/min — ABNORMAL LOW (ref >90)
Glucose, Bld: 118 mg/dL — ABNORMAL HIGH (ref 70–99)
POTASSIUM: 4.6 meq/L (ref 3.7–5.3)
Sodium: 143 mEq/L (ref 137–147)
Total Protein: 7 g/dL (ref 6.0–8.3)

## 2014-01-30 LAB — LIPASE, BLOOD: Lipase: 20 U/L (ref 11–59)

## 2014-01-30 IMAGING — CT CT ABD-PELV W/ CM
1 of 3 series · 13 of 32 positions shown, 18 images · IV contrast (omnipaque)
Comparison: CT Abdomen and Pelvis [DATE].

CLINICAL DATA: 68-year-old female with epigastric abdominal pain
for 1 month. Distal small bowel obstruction related to soft tissue
lesion near the ileocecal valve. Status post colonoscopy 2 days ago
with biopsy, pathology pending. Severe pain. Subsequent encounter.

EXAM:
CT ABDOMEN AND PELVIS WITH CONTRAST
TECHNIQUE: Multidetector CT imaging of the abdomen and pelvis was performed
using the standard protocol following bolus administration of
intravenous contrast.
CONTRAST:  50mL OMNIPAQUE IOHEXOL 300 MG/ML SOLN, 100mL OMNIPAQUE
IOHEXOL 300 MG/ML SOLN

[Series 2: abd/pel with · axial · 0.74mm/px · z∈[+1130,+1570]mm · 13 of 98 slices shown, 18 images]
[im 5/98  soft-tissue]
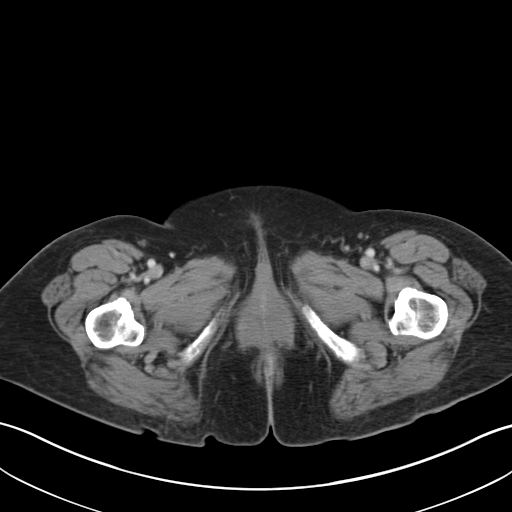
[im 5/98  bone]
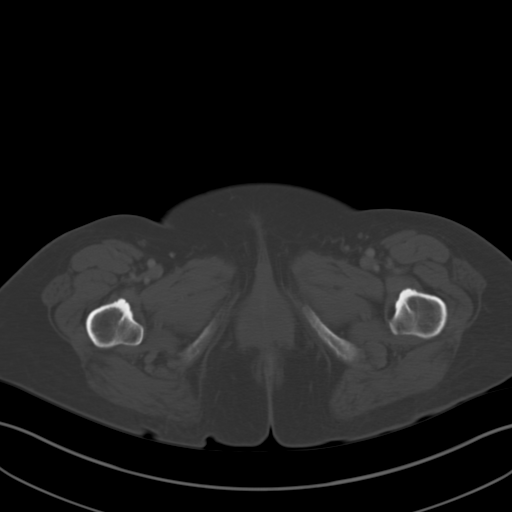
[im 15/98  soft-tissue]
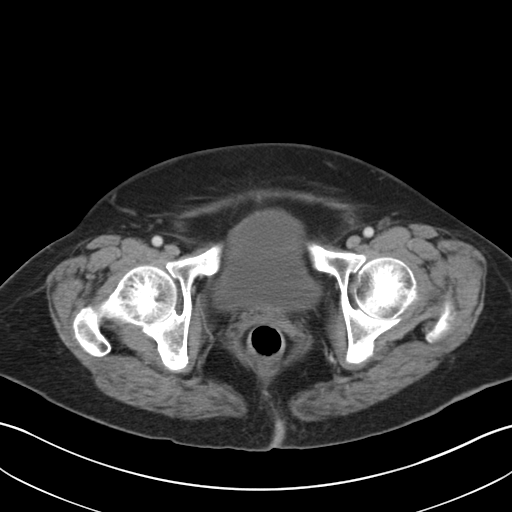
[im 20/98  soft-tissue]
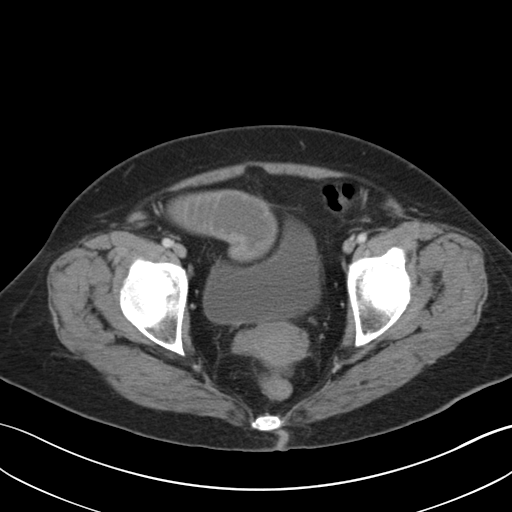
[im 30/98  soft-tissue]
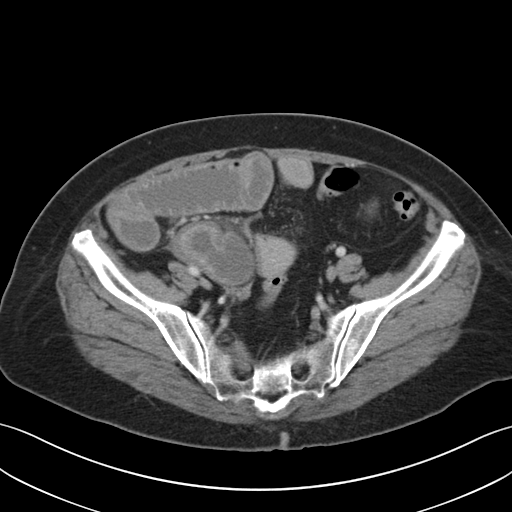
[im 39/98  soft-tissue]
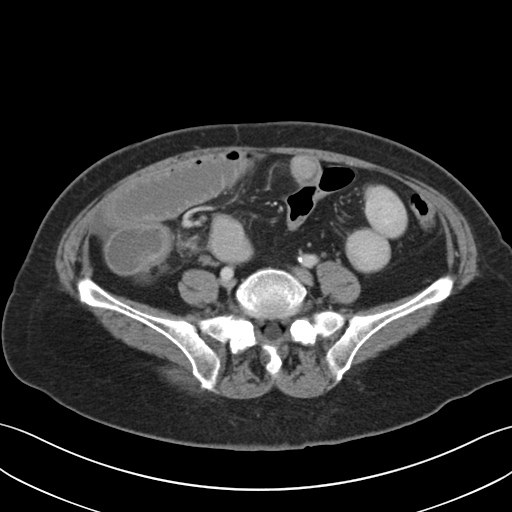
[im 44/98  soft-tissue]
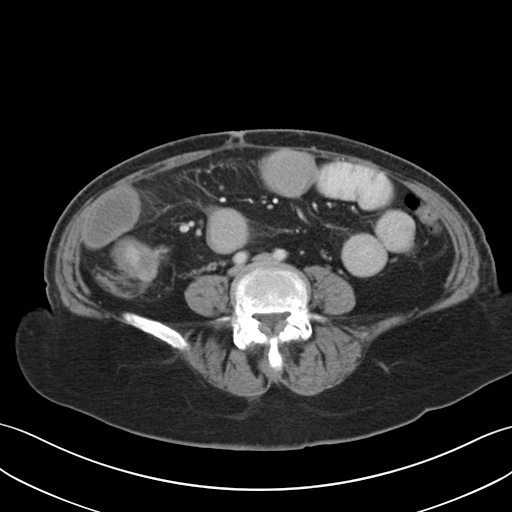
[im 54/98  soft-tissue]
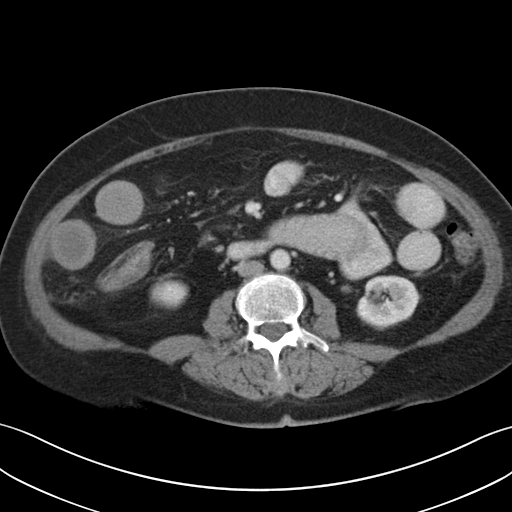
[im 59/98  soft-tissue]
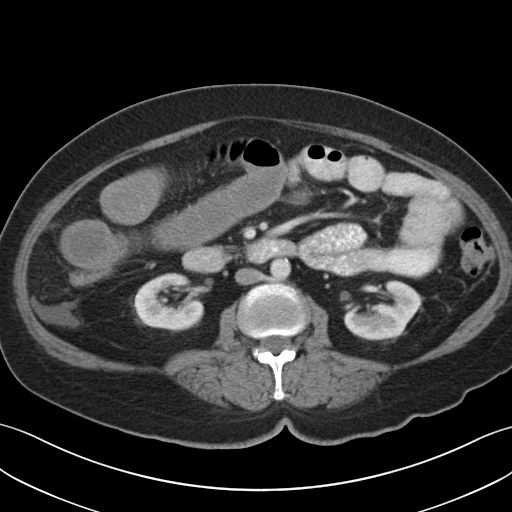
[im 68/98  soft-tissue]
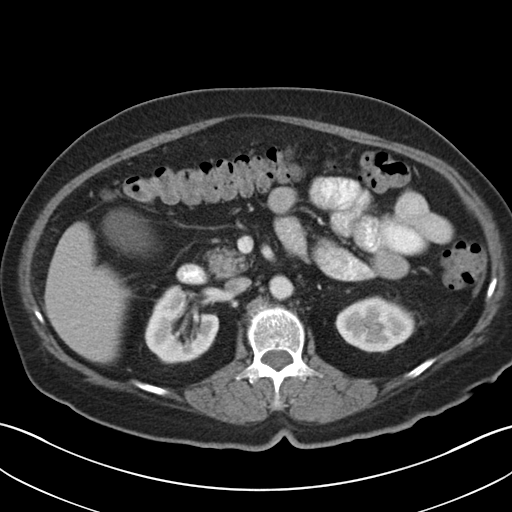
[im 68/98  bone]
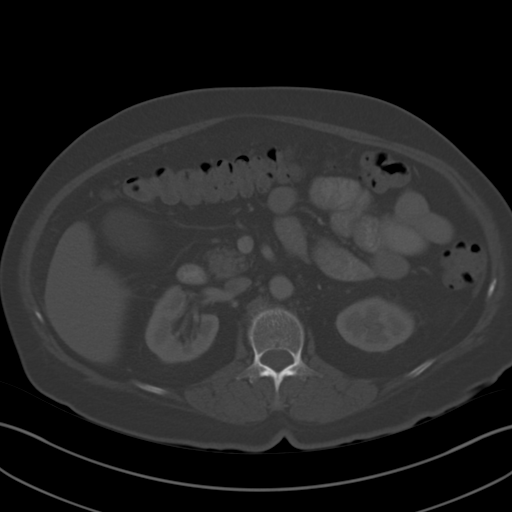
[im 78/98  soft-tissue]
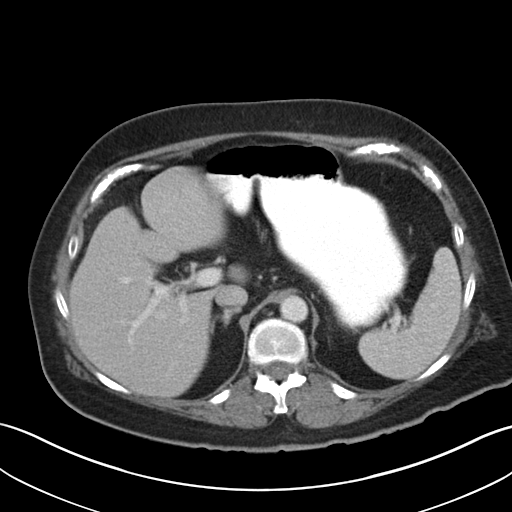
[im 78/98  lung]
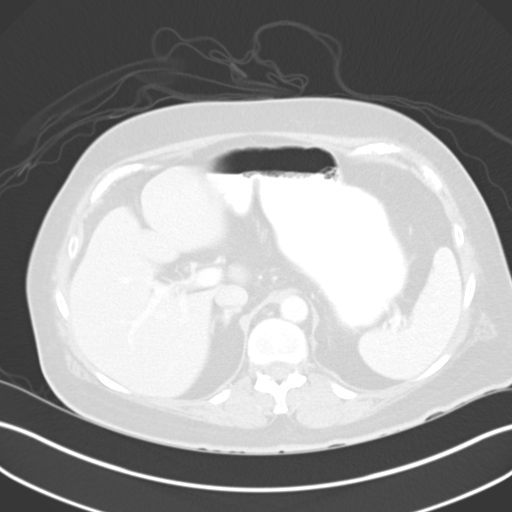
[im 83/98  soft-tissue]
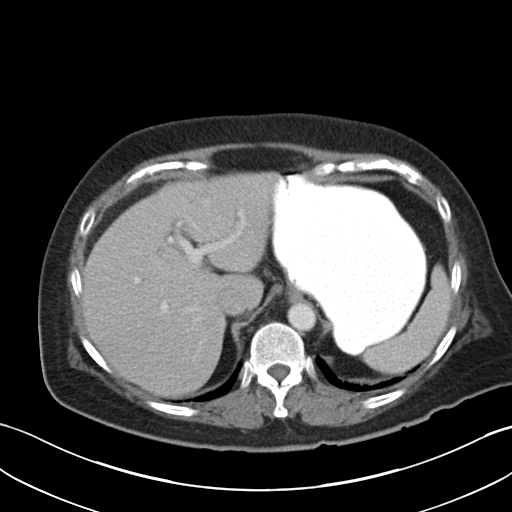
[im 83/98  lung]
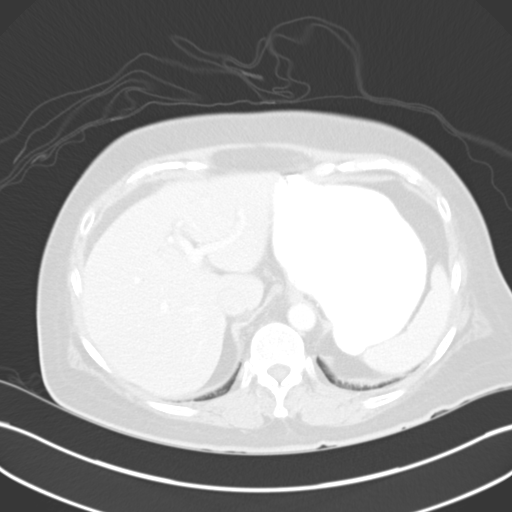
[im 88/98  lung]
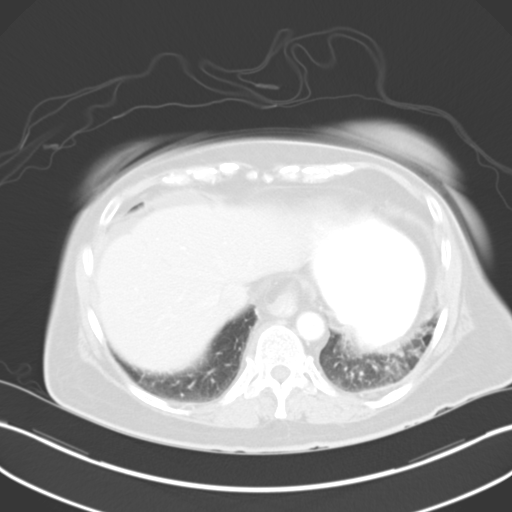
[im 93/98  soft-tissue]
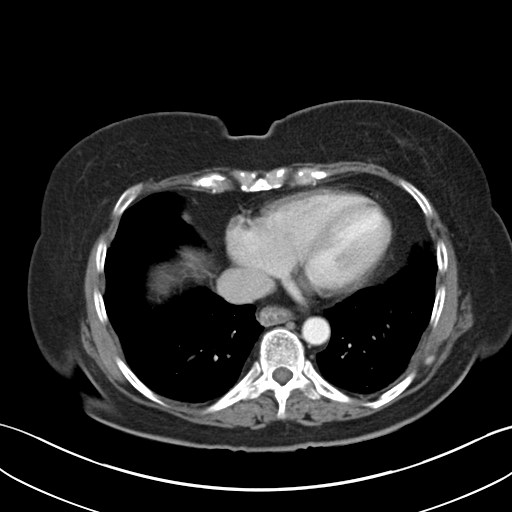
[im 93/98  lung]
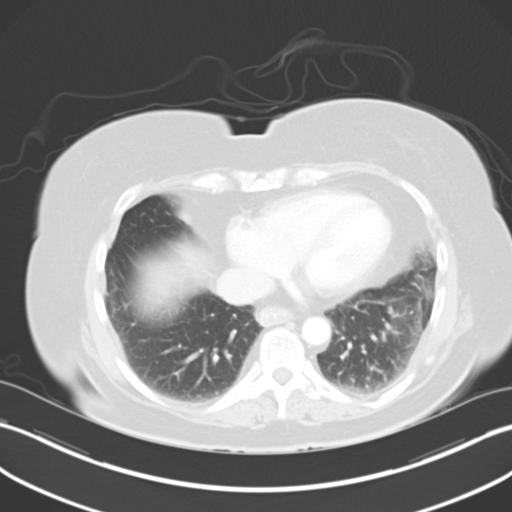

[13 of 32 positions shown; findings below may reference images not displayed]

FINDINGS: Lower lung volumes. Increased, but still mild basilar atelectasis.
No pericardial or pleural effusion.

No acute osseous abnormality identified.

Trace pelvic free fluid is new. Decompressed distal colon.
Unremarkable bladder.

Uterus and adnexa appear stable and within normal limits; the right
adnexa is in proximity to the abnormal small bowel in the right
lower quadrant.

Decompressed sigmoid colon. Mild diverticulosis of the left colon
which does contain some gas. Some gas and stool also present in the
transverse colon. The right colon remains completely decompressed.

Partially enhancing obstructing lesion re- identified in the
terminal ileum just proximal to the ileocecal valve (series 2, image
56), configuration not changed. Adjacent 12 mm mesenteric node re-
identified and stable. No extra luminal gas. No free intraperitoneal
air. Upstream small bowel remains dilated. Dilated loops up to 44 mm
diameter (previously 45 mm). Increased wall thickening of some of
the distal dilated loops (series 2, image 65, wall thickening up to
10 mm. Increased enter loop fluid. Mildly increased distal small
bowel mesenteric stranding.

Gradual transition to nondilated proximal jejunum. Oral contrast in
the stomach. Negative duodenum. Small volume of free fluid now in
the right gutter.

Trace perihepatic and perisplenic fluid is new. Liver, gallbladder,
spleen, pancreas, adrenal glands, portal venous system, and kidneys
remain within normal limits. Mild Aortoiliac calcified
atherosclerosis noted. Major arterial structures in the abdomen and
pelvis are patent.
IMPRESSION: 1. Persistent moderately high-grade small bowel obstruction related
to the terminal ileum mass. Interval increased inflammation of the
dilated loops in the right lower quadrant, now with wall thickening
and free fluid within the leaves of the mesentery. Small volume free
fluid in the right gutter, and adjacent to the liver and spleen.
2. No free air.  No extraluminal air identified.
3. No new abnormality in the abdomen or pelvis.

## 2014-01-30 IMAGING — CR DG ABDOMEN ACUTE W/ 1V CHEST
3 series · 3 of 3 positions shown · non-contrast
Comparison: [DATE].

CLINICAL DATA: Abdominal pain.

EXAM:
ACUTE ABDOMEN SERIES (ABDOMEN 2 VIEW & CHEST 1 VIEW)

[w chest pa]
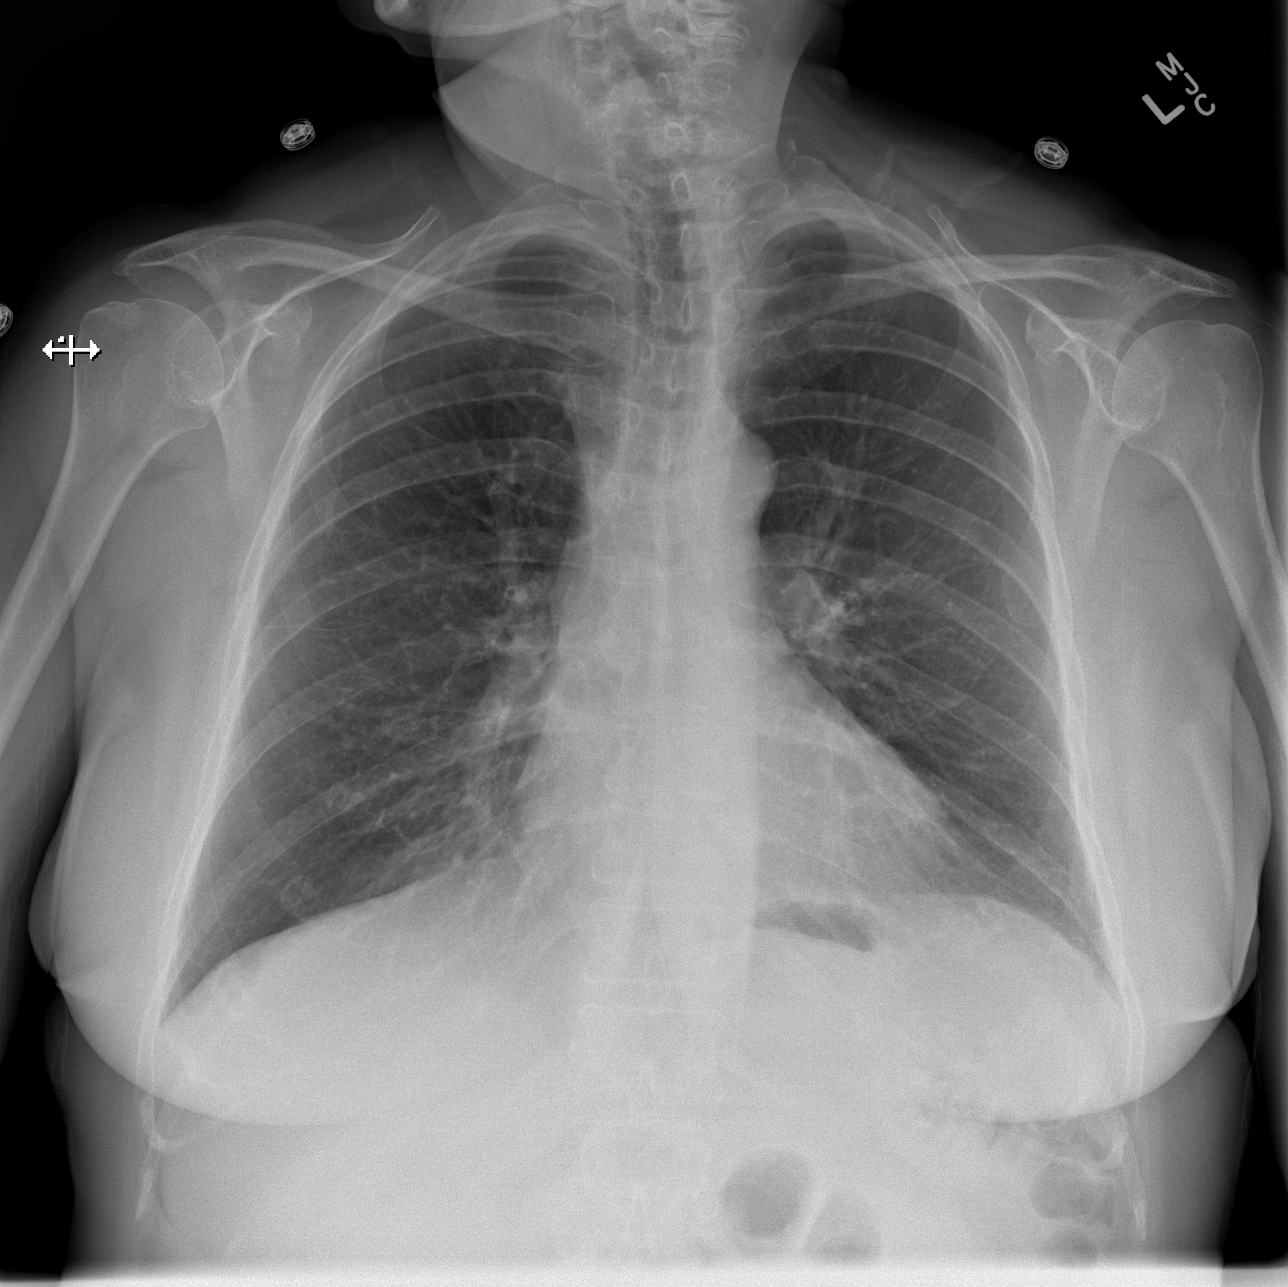

[w abdomen upright]
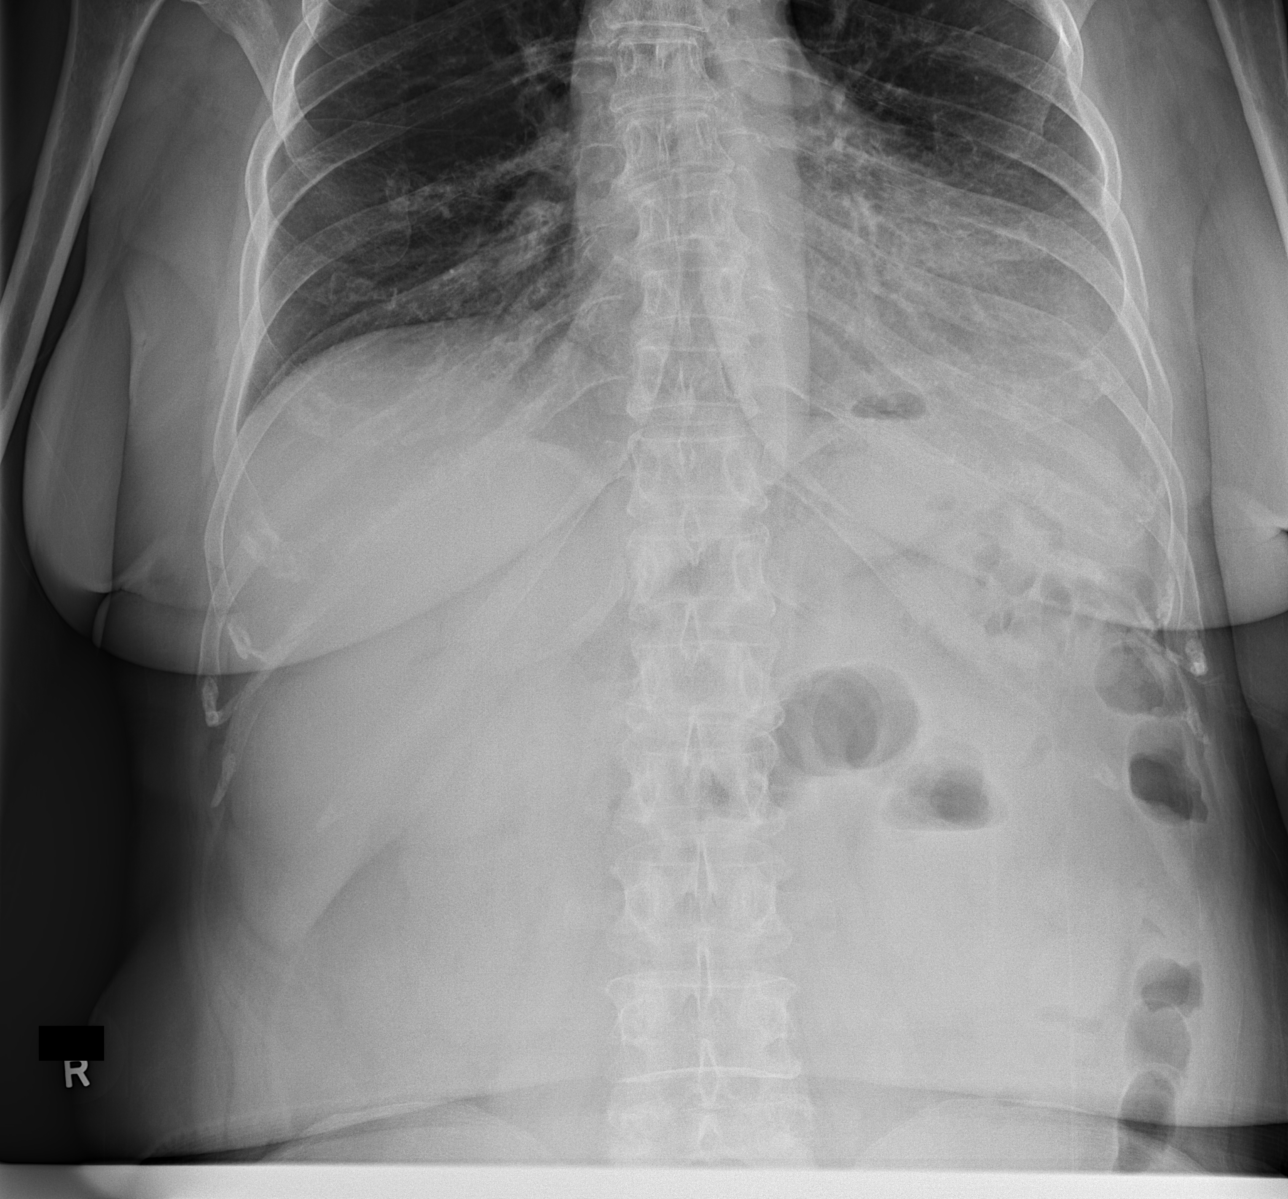

[t abdomen supine]
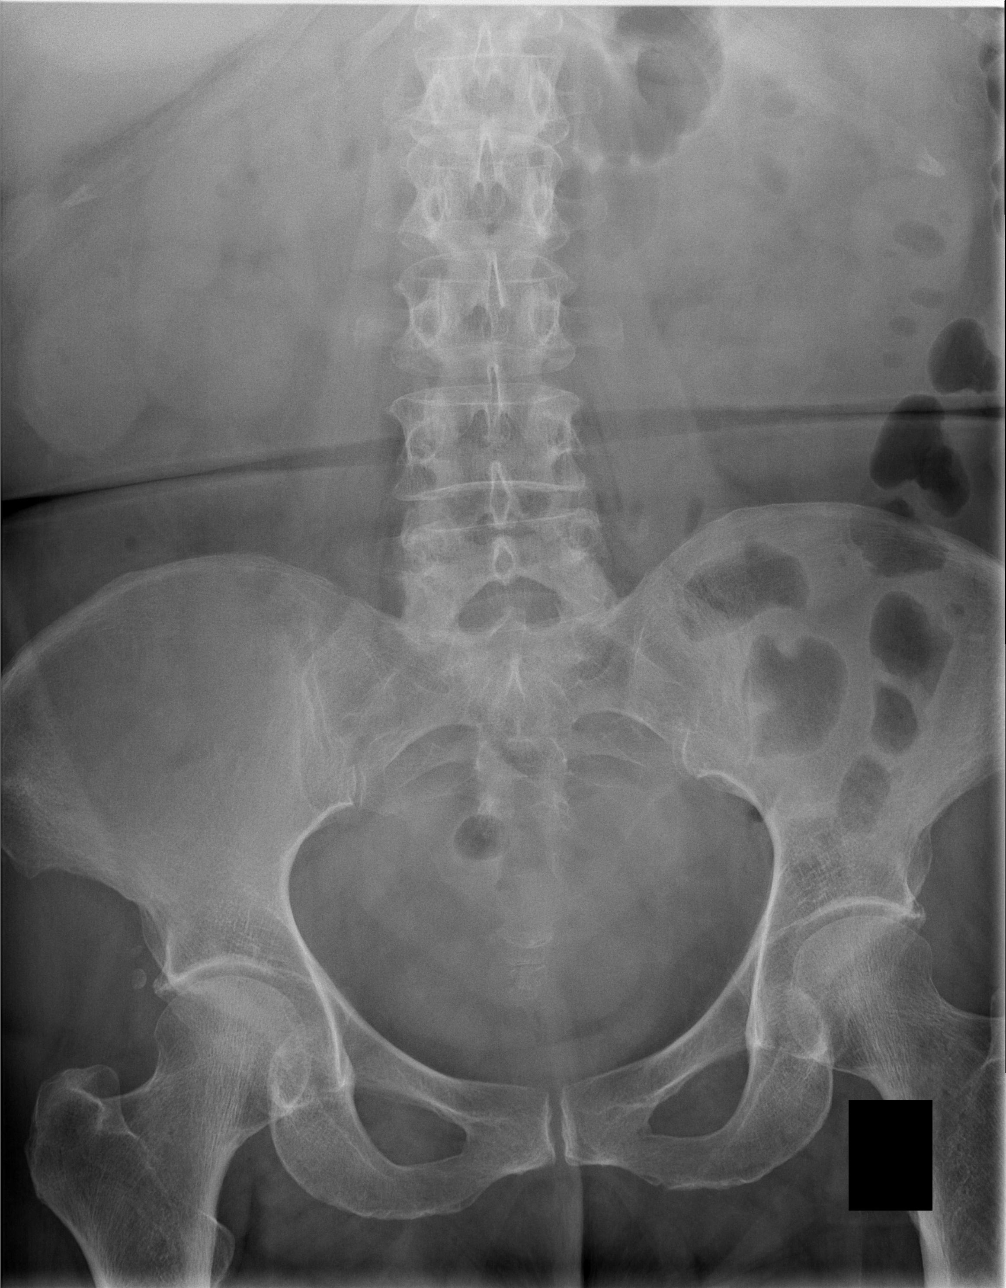

[3 of 3 positions shown; findings below may reference images not displayed]

FINDINGS: There is no evidence of dilated bowel loops or free intraperitoneal
air. No radiopaque calculi or other significant radiographic
abnormality is seen. Heart size and mediastinal contours are within
normal limits. Both lungs are clear.
IMPRESSION: Negative abdominal radiographs.  No acute cardiopulmonary disease.

## 2014-01-30 MED ORDER — ACETAMINOPHEN 325 MG PO TABS: 650.0000 mg | ORAL_TABLET | Freq: Four times a day (QID) | ORAL | Status: DC | PRN

## 2014-01-30 MED ORDER — ONDANSETRON HCL 4 MG/2ML IJ SOLN
4.0000 mg | Freq: Once | INTRAMUSCULAR | Status: AC
  Administered 2014-01-30: 4 mg via INTRAVENOUS
  Filled 2014-01-30: qty 2

## 2014-01-30 MED ORDER — SODIUM CHLORIDE 0.9 % IV BOLUS (SEPSIS)
1000.0000 mL | Freq: Once | INTRAVENOUS | Status: AC
  Administered 2014-01-30: 1000 mL via INTRAVENOUS

## 2014-01-30 MED ORDER — POTASSIUM CHLORIDE IN NACL 20-0.45 MEQ/L-% IV SOLN
INTRAVENOUS | Status: DC
  Administered 2014-01-30 – 2014-01-31 (×4): via INTRAVENOUS
  Filled 2014-01-30 (×5): qty 1000

## 2014-01-30 MED ORDER — LEVOTHYROXINE SODIUM 100 MCG IV SOLR
12.5000 ug | Freq: Every day | INTRAVENOUS | Status: DC
  Administered 2014-01-30 – 2014-02-02 (×4): 12.5 ug via INTRAVENOUS
  Filled 2014-01-30 (×5): qty 5

## 2014-01-30 MED ORDER — ENOXAPARIN SODIUM 40 MG/0.4ML ~~LOC~~ SOLN
40.0000 mg | SUBCUTANEOUS | Status: AC
  Administered 2014-01-30 – 2014-02-02 (×4): 40 mg via SUBCUTANEOUS
  Filled 2014-01-30 (×4): qty 0.4

## 2014-01-30 MED ORDER — ONDANSETRON HCL 4 MG/2ML IJ SOLN: 4.0000 mg | Freq: Four times a day (QID) | INTRAMUSCULAR | Status: DC | PRN

## 2014-01-30 MED ORDER — IOHEXOL 300 MG/ML  SOLN
100.0000 mL | Freq: Once | INTRAMUSCULAR | Status: AC | PRN
  Administered 2014-01-30: 100 mL via INTRAVENOUS

## 2014-01-30 MED ORDER — PANTOPRAZOLE SODIUM 40 MG IV SOLR
40.0000 mg | Freq: Every day | INTRAVENOUS | Status: DC
  Administered 2014-01-30 – 2014-02-02 (×4): 40 mg via INTRAVENOUS
  Filled 2014-01-30 (×5): qty 40

## 2014-01-30 MED ORDER — MORPHINE SULFATE 2 MG/ML IJ SOLN: 1.0000 mg | INTRAMUSCULAR | Status: DC | PRN

## 2014-01-30 MED ORDER — DIPHENHYDRAMINE HCL 12.5 MG/5ML PO ELIX: 12.5000 mg | ORAL_SOLUTION | Freq: Four times a day (QID) | ORAL | Status: DC | PRN

## 2014-01-30 MED ORDER — ACETAMINOPHEN 650 MG RE SUPP: 650.0000 mg | Freq: Four times a day (QID) | RECTAL | Status: DC | PRN

## 2014-01-30 MED ORDER — INSULIN ASPART 100 UNIT/ML ~~LOC~~ SOLN
0.0000 [IU] | Freq: Three times a day (TID) | SUBCUTANEOUS | Status: DC
  Administered 2014-02-01 – 2014-02-02 (×3): 1 [IU] via SUBCUTANEOUS
  Administered 2014-02-02: 2 [IU] via SUBCUTANEOUS
  Administered 2014-02-02 – 2014-02-03 (×2): 1 [IU] via SUBCUTANEOUS

## 2014-01-30 MED ORDER — DEXTROSE 5 % IV SOLN
1.0000 g | Freq: Once | INTRAVENOUS | Status: AC
  Administered 2014-01-30: 1 g via INTRAVENOUS
  Filled 2014-01-30: qty 10

## 2014-01-30 MED ORDER — DIPHENHYDRAMINE HCL 50 MG/ML IJ SOLN: 12.5000 mg | Freq: Four times a day (QID) | INTRAMUSCULAR | Status: DC | PRN

## 2014-01-30 MED ORDER — ALBUTEROL SULFATE (2.5 MG/3ML) 0.083% IN NEBU: 3.0000 mL | INHALATION_SOLUTION | RESPIRATORY_TRACT | Status: DC | PRN

## 2014-01-30 MED ORDER — CEFTRIAXONE SODIUM IN DEXTROSE 20 MG/ML IV SOLN
1.0000 g | INTRAVENOUS | Status: AC
  Administered 2014-01-31 – 2014-02-01 (×2): 1 g via INTRAVENOUS
  Filled 2014-01-30 (×2): qty 50

## 2014-01-30 MED ORDER — PHENOL 1.4 % MT LIQD
1.0000 | OROMUCOSAL | Status: DC | PRN
  Filled 2014-01-30 (×2): qty 177

## 2014-01-30 MED ORDER — IOHEXOL 300 MG/ML  SOLN
50.0000 mL | Freq: Once | INTRAMUSCULAR | Status: AC | PRN
  Administered 2014-01-30: 50 mL via ORAL

## 2014-01-30 NOTE — ED Notes (Signed)
Patient ambulatory from triage Patient in NAD

## 2014-01-30 NOTE — ED Notes (Signed)
MD at bedside. 

## 2014-01-30 NOTE — Telephone Encounter (Signed)
Patient's daughter states patient is very weak and sick. Patient and daughter on the phone together. Patient states she has been sick all night with pain and vomiting black color emesis x 3. Patient instructed to go to ED at [facility]. Dr. Olevia Perches and Nicoletta Ba, PA aware.

## 2014-01-30 NOTE — ED Notes (Signed)
Pt aware of need for urine specimen. Pt states she can not void at this time.

## 2014-01-30 NOTE — ED Notes (Signed)
Pt states that she has had abd pain for month or more and told her PCP so he did blood work that came back normal.  Pt was sent to [facility] and had CT scan abd growth so pt had colonoscopy on Wed. Pt states they did biopsy but hasnt heard the results.  Pt was told to come here due to her severe intermittent abd pain.

## 2014-01-30 NOTE — Progress Notes (Signed)
Utilization Review completed.  Yeslin Delio RN CM  

## 2014-01-30 NOTE — ED Notes (Signed)
Patient NPO since 1800 last night

## 2014-01-30 NOTE — H&P (Signed)
Jessica Farrell 10-Nov-1945  [id number].   Primary Care MD: Dr.  Casco  Chief Complaint/Reason for Consult: SBO secondary to mass HPI: This is a 68 yo white female who has never had a colonoscopy before began having some RLQ abdominal pain about 2 months ago.  She had N/V and thought she had food poisoning.  It resolved and then came back again several weeks later.  She saw her PCP who sent her to GI for further evaluation.  Here she had a CT scan which revealed a mass in the RLQ.  A coloscopy was then set up.  This was done on Wednesday of this week.  Biopsies were obtained of a soft sponge-like mass at the ICV .  This was felt to be suspicious for a neuroendocrine tumor.  Biopsies are still pending.  Yesterday, she continued to have worsening pain in her RLQ with profuse N/V.  She called the GI office and she was referred here.  A CT scan reveals a high grade SBO secondary to this mass as the ICV.  It has inflammatory changed and wall thickening of the surrounding small bowel.  Due to SBO, we have been asked to see the patient for admission.  ROS : Please see HPI, otherwise all other systems are negative.  Despite no urinary symptoms, she does have a UTI here on UA. + chronic cough secondary to asthma  Family History  Problem Relation Age of Onset  . Alzheimer's disease Father   . Heart disease Mother   . Arthritis Mother   . Hypertension Mother   . Alzheimer's disease Sister   . Heart disease Brother     Heart Disease before age 89  . Colon cancer Neg Hx   . Esophageal cancer Neg Hx   . Rectal cancer Neg Hx   . Stomach cancer Neg Hx     Past Medical History  Diagnosis Date  . Asthma   . Hyperlipidemia   . Diabetes mellitus 05/05/2012    Type II  . Thyroid disease 1990's    HypoThyroidism  . Cellulitis March  2014    Left foot and ankle  . Morbid obesity     Past Surgical History  Procedure Laterality Date  . Fracture surgery  2000    Ankle  . Tooth extraction      wisdom  Teeth    Social History:  reports that she has never smoked. She has never used smokeless tobacco. She reports that she does not drink alcohol or use illicit drugs.  Allergies:  Allergies  Allergen Reactions  . Penicillins Rash     (Not in a hospital admission)  Blood pressure 110/58, pulse 86, temperature 97.8 F (36.6 C), temperature source Oral, resp. rate 18, SpO2 95 %. Physical Exam: General: pleasant, obese white female who is laying in bed in NAD HEENT: head is normocephalic, atraumatic.  Sclera are noninjected.  PERRL.  Ears and nose without any masses or lesions.  Mouth is pink and moist Heart: regular, rate, and rhythm.  Normal s1,s2. No obvious murmurs, gallops, or rubs noted.  Palpable radial and pedal pulses bilaterally Lungs: minimal rhonchi noted in right lung, but no wheezes.  Respiratory effort nonlabored Abd: soft, tender in the RLQ, mild distention, +BS, no masses, hernias, or organomegaly MS: all 4 extremities are symmetrical with no cyanosis, clubbing, or edema. Skin: warm and dry with no masses, lesions, or rashes Psych: A&Ox3 with an appropriate affect.    Results for orders placed or performed  during the hospital encounter of 01/30/14 (from the past 48 hour(s))  CBC with Differential     Status: None   Collection Time: 01/30/14 11:24 AM  Result Value Ref Range   WBC 7.5 4.0 - 10.5 K/uL   RBC 4.70 3.87 - 5.11 MIL/uL   Hemoglobin 13.8 12.0 - 15.0 g/dL   HCT 41.4 36.0 - 46.0 %   MCV 88.1 78.0 - 100.0 fL   MCH 29.4 26.0 - 34.0 pg   MCHC 33.3 30.0 - 36.0 g/dL   RDW 13.1 11.5 - 15.5 %   Platelets 255 150 - 400 K/uL   Neutrophils Relative % 64 43 - 77 %   Neutro Abs 4.8 1.7 - 7.7 K/uL   Lymphocytes Relative 27 12 - 46 %   Lymphs Abs 2.0 0.7 - 4.0 K/uL   Monocytes Relative 9 3 - 12 %   Monocytes Absolute 0.7 0.1 - 1.0 K/uL   Eosinophils Relative 0 0 - 5 %   Eosinophils Absolute 0.0 0.0 - 0.7 K/uL   Basophils Relative 0 0 - 1 %   Basophils Absolute 0.0  0.0 - 0.1 K/uL  Comprehensive metabolic panel     Status: Abnormal   Collection Time: 01/30/14 11:24 AM  Result Value Ref Range   Sodium 143 137 - 147 mEq/L   Potassium 4.6 3.7 - 5.3 mEq/L   Chloride 104 96 - 112 mEq/L   CO2 24 19 - 32 mEq/L   Glucose, Bld 118 (H) 70 - 99 mg/dL   BUN 15 6 - 23 mg/dL   Creatinine, Ser 0.71 0.50 - 1.10 mg/dL   Calcium 9.4 8.4 - 10.5 mg/dL   Total Protein 7.0 6.0 - 8.3 g/dL   Albumin 3.5 3.5 - 5.2 g/dL   AST 13 0 - 37 U/L   ALT 13 0 - 35 U/L   Alkaline Phosphatase 136 (H) 39 - 117 U/L   Total Bilirubin 0.3 0.3 - 1.2 mg/dL   GFR calc non Af Amer 87 (L) >90 mL/min   GFR calc Af Amer >90 >90 mL/min    Comment: (NOTE) The eGFR has been calculated using the CKD EPI equation. This calculation has not been validated in all clinical situations. eGFR's persistently <90 mL/min signify possible Chronic Kidney Disease.    Anion gap 15 5 - 15  Lipase, blood     Status: None   Collection Time: 01/30/14 11:24 AM  Result Value Ref Range   Lipase 20 11 - 59 U/L  Urinalysis, Routine w reflex microscopic     Status: Abnormal   Collection Time: 01/30/14 12:27 PM  Result Value Ref Range   Color, Urine YELLOW YELLOW   APPearance CLOUDY (A) CLEAR   Specific Gravity, Urine 1.024 1.005 - 1.030   pH 5.5 5.0 - 8.0   Glucose, UA NEGATIVE NEGATIVE mg/dL   Hgb urine dipstick NEGATIVE NEGATIVE   Bilirubin Urine NEGATIVE NEGATIVE   Ketones, ur NEGATIVE NEGATIVE mg/dL   Protein, ur NEGATIVE NEGATIVE mg/dL   Urobilinogen, UA 0.2 0.0 - 1.0 mg/dL   Nitrite POSITIVE (A) NEGATIVE   Leukocytes, UA SMALL (A) NEGATIVE  Urine microscopic-add on     Status: Abnormal   Collection Time: 01/30/14 12:27 PM  Result Value Ref Range   Squamous Epithelial / LPF RARE RARE   WBC, UA 11-20 <3 WBC/hpf   Bacteria, UA MANY (A) RARE   Crystals CA OXALATE CRYSTALS (A) NEGATIVE   Ct Abdomen Pelvis W Contrast  01/30/2014  CLINICAL DATA:  68 year old female with epigastric abdominal pain  for 1 month. Distal small bowel obstruction related to soft tissue lesion near the ileocecal valve. Status post colonoscopy 2 days ago with biopsy, pathology pending. Severe pain. Subsequent encounter.  EXAM: CT ABDOMEN AND PELVIS WITH CONTRAST  TECHNIQUE: Multidetector CT imaging of the abdomen and pelvis was performed using the standard protocol following bolus administration of intravenous contrast.  CONTRAST:  25mL OMNIPAQUE IOHEXOL 300 MG/ML SOLN, 130mL OMNIPAQUE IOHEXOL 300 MG/ML SOLN  COMPARISON:  CT Abdomen and Pelvis 01/21/2014.  FINDINGS: Lower lung volumes. Increased, but still mild basilar atelectasis. No pericardial or pleural effusion.  No acute osseous abnormality identified.  Trace pelvic free fluid is new. Decompressed distal colon. Unremarkable bladder.  Uterus and adnexa appear stable and within normal limits; the right adnexa is in proximity to the abnormal small bowel in the right lower quadrant.  Decompressed sigmoid colon. Mild diverticulosis of the left colon which does contain some gas. Some gas and stool also present in the transverse colon. The right colon remains completely decompressed.  Partially enhancing obstructing lesion re- identified in the terminal ileum just proximal to the ileocecal valve (series 2, image 56), configuration not changed. Adjacent 12 mm mesenteric node re- identified and stable. No extra luminal gas. No free intraperitoneal air. Upstream small bowel remains dilated. Dilated loops up to 44 mm diameter (previously 45 mm). Increased wall thickening of some of the distal dilated loops (series 2, image 65, wall thickening up to 10 mm. Increased enter loop fluid. Mildly increased distal small bowel mesenteric stranding.  Gradual transition to nondilated proximal jejunum. Oral contrast in the stomach. Negative duodenum. Small volume of free fluid now in the right gutter.  Trace perihepatic and perisplenic fluid is new. Liver, gallbladder, spleen, pancreas, adrenal  glands, portal venous system, and kidneys remain within normal limits. Mild Aortoiliac calcified atherosclerosis noted. Major arterial structures in the abdomen and pelvis are patent.  IMPRESSION: 1. Persistent moderately high-grade small bowel obstruction related to the terminal ileum mass. Interval increased inflammation of the dilated loops in the right lower quadrant, now with wall thickening and free fluid within the leaves of the mesentery. Small volume free fluid in the right gutter, and adjacent to the liver and spleen. 2. No free air.  No extraluminal air identified. 3. No new abnormality in the abdomen or pelvis.   Electronically Signed   By: Lars Pinks M.D.   On: 01/30/2014 14:47   Dg Abd Acute W/chest  01/30/2014   CLINICAL DATA:  Abdominal pain.  EXAM: ACUTE ABDOMEN SERIES (ABDOMEN 2 VIEW & CHEST 1 VIEW)  COMPARISON:  May 05, 2012.  FINDINGS: There is no evidence of dilated bowel loops or free intraperitoneal air. No radiopaque calculi or other significant radiographic abnormality is seen. Heart size and mediastinal contours are within normal limits. Both lungs are clear.  IMPRESSION: Negative abdominal radiographs.  No acute cardiopulmonary disease.   Electronically Signed   By: Sabino Dick M.D.   On: 01/30/2014 11:48       Assessment/Plan 1. SBO secondary to mass as ileocecal valve 2. UTI 3. DM 4. Asthma 5. Hypothyroidism  Plan: 1. We will get the patient admitted and place an NGT for decompression.  She will need surgical intervention this admission for right colectomy.  I have explained this to the patient, her daughter, and daughter-in-law.  She will get 3 days of Rocephin due to a UTI, then this will be stopped. 2. SSI for  DM 3. IV synthroid for hypothyroidism 4. I called Dr. Nichola Sizer office and her biopsies from an ascending colon polyp and her mass as her ICV were still pending.  A rush was put on these, but not back yet.  Yarelli Decelles E 01/30/2014, 4:03 PM Pager:  [phone]

## 2014-01-30 NOTE — ED Provider Notes (Signed)
CSN: [id number]     Arrival date & time 01/30/14  1044 History   First MD Initiated Contact with Patient 01/30/14 1100     Chief Complaint  Patient presents with  . Abdominal Pain     (Consider location/radiation/quality/duration/timing/severity/associated sxs/prior Treatment) The history is provided by the patient.  Jessica Farrell is a 68 y.o. female hx of DM, HL here presenting with abdominal pain and vomiting. She had recent CT that showed small bowel obstruction from a mass. Dr. Olevia Perches biopsy the mass 2 days ago was concerned with possible neuroendocrine tumor. She hasn't passed gas since yesterday and no bowel movement since the colonoscopy 2 days ago. She has been having profuse vomiting since yesterday and unable to keep anything down. Denies any fevers or chills or urinary symptoms. Daughter called GI and was sent here for evaluation.    Past Medical History  Diagnosis Date  . Asthma   . Anemia April 2014    Urgent [facility]  . Hyperlipidemia   . Diabetes mellitus 05/05/2012    Type II  . Malaise and fatigue April 2014  . Cold intolerance April 2014  . Polyuria April 2014  . Thyroid disease 1990's    HypoThyroidism  . Cellulitis March  2014    Left foot and ankle  . Morbid obesity    Past Surgical History  Procedure Laterality Date  . Fracture surgery  2000    Ankle  . Tooth extraction      wisdom Teeth   Family History  Problem Relation Age of Onset  . Alzheimer's disease Father   . Heart disease Mother   . Arthritis Mother   . Hypertension Mother   . Alzheimer's disease Sister   . Heart disease Brother     Heart Disease before age 69  . Colon cancer Neg Hx   . Esophageal cancer Neg Hx   . Rectal cancer Neg Hx   . Stomach cancer Neg Hx    History  Substance Use Topics  . Smoking status: Never Smoker   . Smokeless tobacco: Never Used  . Alcohol Use: No   OB History    No data available     Review of Systems  Gastrointestinal: Positive for nausea,  vomiting and abdominal pain.  All other systems reviewed and are negative.     Allergies  Penicillins  Home Medications   Prior to Admission medications   Medication Sig Start Date End Date Taking? Authorizing Provider  Albuterol Sulfate 108 (90 BASE) MCG/ACT AEPB Inhale 2 puffs into the lungs every 4 (four) hours as needed (wheezing/SOB).    Yes Historical Provider, MD  atorvastatin (LIPITOR) 10 MG tablet Take 10 mg by mouth daily.   Yes Historical Provider, MD  dicyclomine (BENTYL) 10 MG capsule Take 3 times daily as needed for abdominal pain Patient taking differently: Take 10 mg by mouth 3 (three) times daily as needed for spasms. Take 3 times daily as needed for abdominal pain 01/19/14  Yes Amy S Esterwood, PA-C  levothyroxine (SYNTHROID, LEVOTHROID) 25 MCG tablet Take 1 tablet (25 mcg total) by mouth daily before breakfast. 05/09/12  Yes Shanker Kristeen Mans, MD  metFORMIN (GLUCOPHAGE) 500 MG tablet Take 1 tablet (500 mg total) by mouth 2 (two) times daily with a meal. 05/09/12  Yes Shanker Kristeen Mans, MD  ondansetron (ZOFRAN) 4 MG tablet Take 1 tab every 6 hours as needed for nausea. 01/22/14  Yes Amy S Esterwood, PA-C  traMADol (ULTRAM) 50 MG tablet Take  1 tablet (50 mg total) by mouth every 6 (six) hours as needed. 01/22/14  Yes Amy S Esterwood, PA-C  omeprazole (PRILOSEC) 40 MG capsule Take 1 capsule (40 mg total) by mouth daily. Patient not taking: Reported on 01/28/2014 01/19/14   Amy S Esterwood, PA-C   BP 110/58 mmHg  Pulse 86  Temp(Src) 97.8 F (36.6 C) (Oral)  Resp 18  SpO2 95% Physical Exam  Constitutional: She is oriented to person, place, and time.  Uncomfortable   HENT:  Head: Normocephalic.  MM slightly dry   Eyes: Conjunctivae are normal. Pupils are equal, round, and reactive to light.  Neck: Normal range of motion. Neck supple.  Cardiovascular: Regular rhythm and normal heart sounds.   Tachycardic   Pulmonary/Chest: Effort normal and breath sounds normal. No  respiratory distress. She has no wheezes. She has no rales.  Abdominal: Soft.  Mildly distended. + mild diffuse tenderness, no guarding or rebound.   Musculoskeletal: Normal range of motion. She exhibits no edema or tenderness.  Neurological: She is alert and oriented to person, place, and time.  Skin: Skin is warm and dry.  Psychiatric: She has a normal mood and affect. Her behavior is normal. Judgment and thought content normal.  Nursing note and vitals reviewed.   ED Course  Procedures (including critical care time) Labs Review Labs Reviewed  COMPREHENSIVE METABOLIC PANEL - Abnormal; Notable for the following:    Glucose, Bld 118 (*)    Alkaline Phosphatase 136 (*)    GFR calc non Af Amer 87 (*)    All other components within normal limits  URINALYSIS, ROUTINE W REFLEX MICROSCOPIC - Abnormal; Notable for the following:    APPearance CLOUDY (*)    Nitrite POSITIVE (*)    Leukocytes, UA SMALL (*)    All other components within normal limits  URINE MICROSCOPIC-ADD ON - Abnormal; Notable for the following:    Bacteria, UA MANY (*)    Crystals CA OXALATE CRYSTALS (*)    All other components within normal limits  CBC WITH DIFFERENTIAL  LIPASE, BLOOD    Imaging Review Ct Abdomen Pelvis W Contrast  01/30/2014   CLINICAL DATA:  68 year old female with epigastric abdominal pain for 1 month. Distal small bowel obstruction related to soft tissue lesion near the ileocecal valve. Status post colonoscopy 2 days ago with biopsy, pathology pending. Severe pain. Subsequent encounter.  EXAM: CT ABDOMEN AND PELVIS WITH CONTRAST  TECHNIQUE: Multidetector CT imaging of the abdomen and pelvis was performed using the standard protocol following bolus administration of intravenous contrast.  CONTRAST:  53mL OMNIPAQUE IOHEXOL 300 MG/ML SOLN, 179mL OMNIPAQUE IOHEXOL 300 MG/ML SOLN  COMPARISON:  CT Abdomen and Pelvis 01/21/2014.  FINDINGS: Lower lung volumes. Increased, but still mild basilar atelectasis.  No pericardial or pleural effusion.  No acute osseous abnormality identified.  Trace pelvic free fluid is new. Decompressed distal colon. Unremarkable bladder.  Uterus and adnexa appear stable and within normal limits; the right adnexa is in proximity to the abnormal small bowel in the right lower quadrant.  Decompressed sigmoid colon. Mild diverticulosis of the left colon which does contain some gas. Some gas and stool also present in the transverse colon. The right colon remains completely decompressed.  Partially enhancing obstructing lesion re- identified in the terminal ileum just proximal to the ileocecal valve (series 2, image 56), configuration not changed. Adjacent 12 mm mesenteric node re- identified and stable. No extra luminal gas. No free intraperitoneal air. Upstream small bowel remains dilated. Dilated  loops up to 44 mm diameter (previously 45 mm). Increased wall thickening of some of the distal dilated loops (series 2, image 65, wall thickening up to 10 mm. Increased enter loop fluid. Mildly increased distal small bowel mesenteric stranding.  Gradual transition to nondilated proximal jejunum. Oral contrast in the stomach. Negative duodenum. Small volume of free fluid now in the right gutter.  Trace perihepatic and perisplenic fluid is new. Liver, gallbladder, spleen, pancreas, adrenal glands, portal venous system, and kidneys remain within normal limits. Mild Aortoiliac calcified atherosclerosis noted. Major arterial structures in the abdomen and pelvis are patent.  IMPRESSION: 1. Persistent moderately high-grade small bowel obstruction related to the terminal ileum mass. Interval increased inflammation of the dilated loops in the right lower quadrant, now with wall thickening and free fluid within the leaves of the mesentery. Small volume free fluid in the right gutter, and adjacent to the liver and spleen. 2. No free air.  No extraluminal air identified. 3. No new abnormality in the abdomen or  pelvis.   Electronically Signed   By: Lars Pinks M.D.   On: 01/30/2014 14:47   Dg Abd Acute W/chest  01/30/2014   CLINICAL DATA:  Abdominal pain.  EXAM: ACUTE ABDOMEN SERIES (ABDOMEN 2 VIEW & CHEST 1 VIEW)  COMPARISON:  May 05, 2012.  FINDINGS: There is no evidence of dilated bowel loops or free intraperitoneal air. No radiopaque calculi or other significant radiographic abnormality is seen. Heart size and mediastinal contours are within normal limits. Both lungs are clear.  IMPRESSION: Negative abdominal radiographs.  No acute cardiopulmonary disease.   Electronically Signed   By: Sabino Dick M.D.   On: 01/30/2014 11:48     EKG Interpretation None      MDM   Final diagnoses:  Abdominal pain   Jessica Farrell is a 68 y.o. female here with ab pain, vomiting. Concerned for SBO vs perforation given recent procedure. Will check labs, xrays. If xray neg will need repeat CT.    12 pm Xray unremarkable. Will order CT.   3:22 PM CT showed high grade SBO from mass. I consulted surgery to admit for operation.   Wandra Arthurs, MD 01/30/14 [phone]

## 2014-01-30 NOTE — ED Notes (Signed)
Patient transported to X-ray 

## 2014-01-31 LAB — BASIC METABOLIC PANEL
ANION GAP: 13 (ref 5–15)
BUN: 13 mg/dL (ref 6–23)
CALCIUM: 8.3 mg/dL — AB (ref 8.4–10.5)
CO2: 22 meq/L (ref 19–32)
Chloride: 105 mEq/L (ref 96–112)
Creatinine, Ser: 0.74 mg/dL (ref 0.50–1.10)
GFR calc Af Amer: >90 mL/min (ref >90)
GFR calc non Af Amer: 85 mL/min — ABNORMAL LOW (ref >90)
Glucose, Bld: 113 mg/dL — ABNORMAL HIGH (ref 70–99)
Potassium: 4.1 mEq/L (ref 3.7–5.3)
Sodium: 140 mEq/L (ref 137–147)

## 2014-01-31 LAB — CBC
HEMATOCRIT: 37.6 % (ref 36.0–46.0)
Hemoglobin: 11.9 g/dL — ABNORMAL LOW (ref 12.0–15.0)
MCH: 28.4 pg (ref 26.0–34.0)
MCHC: 31.6 g/dL (ref 30.0–36.0)
MCV: 89.7 fL (ref 78.0–100.0)
PLATELETS: 222 10*3/uL (ref 150–400)
RBC: 4.19 MIL/uL (ref 3.87–5.11)
RDW: 13.5 % (ref 11.5–15.5)
WBC: 6 10*3/uL (ref 4.0–10.5)

## 2014-01-31 LAB — GLUCOSE, CAPILLARY
GLUCOSE-CAPILLARY: 75 mg/dL (ref 70–99)
GLUCOSE-CAPILLARY: 80 mg/dL (ref 70–99)
Glucose-Capillary: 98 mg/dL (ref 70–99)
Glucose-Capillary: 81 mg/dL (ref 70–99)

## 2014-01-31 NOTE — Progress Notes (Signed)
Subjective: No flatus or BM Feels less distended NG is bothersome  No update on pathology report  Objective: Vital signs in last 24 hours: Temp:  [98.4 F (36.9 C)-99.3 F (37.4 C)] 98.4 F (36.9 C) (12/12 0430) Pulse Rate:  [86-94] 94 (12/12 0430) Resp:  [16-20] 18 (12/12 0430) BP: (97-110)/(58-66) 97/61 mmHg (12/12 0430) SpO2:  [93 %-97 %] 93 % (12/12 0430) Weight:  [164 lb 7.4 oz (74.6 kg)] 164 lb 7.4 oz (74.6 kg) (12/11 1700) Last BM Date: 01/28/14  Intake/Output from previous day: 12/11 0701 - 12/12 0700 In: 1256.7 [I.V.:1136.7; NG/GT:120] Out: 900 [Urine:250; Emesis/NG output:650] Intake/Output this shift: Total I/O In: 0  Out: 600 [Urine:600]  General appearance: alert, cooperative and no distress Resp: clear to auscultation bilaterally Cardio: regular rate and rhythm, S1, S2 normal, no murmur, click, rub or gallop GI: mildly distended; hypoactive bowel sounds; mild lower abdominal tenderness  Lab Results:   Recent Labs  01/30/14 1124 01/31/14 0520  WBC 7.5 6.0  HGB 13.8 11.9*  HCT 41.4 37.6  PLT 255 222   BMET  Recent Labs  01/30/14 1124 01/31/14 0520  NA 143 140  K 4.6 4.1  CL 104 105  CO2 24 22  GLUCOSE 118* 113*  BUN 15 13  CREATININE 0.71 0.74  CALCIUM 9.4 8.3*   PT/INR No results for input(s): LABPROT, INR in the last 72 hours. ABG No results for input(s): PHART, HCO3 in the last 72 hours.  Invalid input(s): PCO2, PO2  Studies/Results: Ct Abdomen Pelvis W Contrast  01/30/2014   CLINICAL DATA:  68 year old female with epigastric abdominal pain for 1 month. Distal small bowel obstruction related to soft tissue lesion near the ileocecal valve. Status post colonoscopy 2 days ago with biopsy, pathology pending. Severe pain. Subsequent encounter.  EXAM: CT ABDOMEN AND PELVIS WITH CONTRAST  TECHNIQUE: Multidetector CT imaging of the abdomen and pelvis was performed using the standard protocol following bolus administration of intravenous  contrast.  CONTRAST:  15mL OMNIPAQUE IOHEXOL 300 MG/ML SOLN, 148mL OMNIPAQUE IOHEXOL 300 MG/ML SOLN  COMPARISON:  CT Abdomen and Pelvis 01/21/2014.  FINDINGS: Lower lung volumes. Increased, but still mild basilar atelectasis. No pericardial or pleural effusion.  No acute osseous abnormality identified.  Trace pelvic free fluid is new. Decompressed distal colon. Unremarkable bladder.  Uterus and adnexa appear stable and within normal limits; the right adnexa is in proximity to the abnormal small bowel in the right lower quadrant.  Decompressed sigmoid colon. Mild diverticulosis of the left colon which does contain some gas. Some gas and stool also present in the transverse colon. The right colon remains completely decompressed.  Partially enhancing obstructing lesion re- identified in the terminal ileum just proximal to the ileocecal valve (series 2, image 56), configuration not changed. Adjacent 12 mm mesenteric node re- identified and stable. No extra luminal gas. No free intraperitoneal air. Upstream small bowel remains dilated. Dilated loops up to 44 mm diameter (previously 45 mm). Increased wall thickening of some of the distal dilated loops (series 2, image 65, wall thickening up to 10 mm. Increased enter loop fluid. Mildly increased distal small bowel mesenteric stranding.  Gradual transition to nondilated proximal jejunum. Oral contrast in the stomach. Negative duodenum. Small volume of free fluid now in the right gutter.  Trace perihepatic and perisplenic fluid is new. Liver, gallbladder, spleen, pancreas, adrenal glands, portal venous system, and kidneys remain within normal limits. Mild Aortoiliac calcified atherosclerosis noted. Major arterial structures in the abdomen and pelvis are  patent.  IMPRESSION: 1. Persistent moderately high-grade small bowel obstruction related to the terminal ileum mass. Interval increased inflammation of the dilated loops in the right lower quadrant, now with wall thickening  and free fluid within the leaves of the mesentery. Small volume free fluid in the right gutter, and adjacent to the liver and spleen. 2. No free air.  No extraluminal air identified. 3. No new abnormality in the abdomen or pelvis.   Electronically Signed   By: Lars Pinks M.D.   On: 01/30/2014 14:47   Dg Abd Acute W/chest  01/30/2014   CLINICAL DATA:  Abdominal pain.  EXAM: ACUTE ABDOMEN SERIES (ABDOMEN 2 VIEW & CHEST 1 VIEW)  COMPARISON:  May 05, 2012.  FINDINGS: There is no evidence of dilated bowel loops or free intraperitoneal air. No radiopaque calculi or other significant radiographic abnormality is seen. Heart size and mediastinal contours are within normal limits. Both lungs are clear.  IMPRESSION: Negative abdominal radiographs.  No acute cardiopulmonary disease.   Electronically Signed   By: Sabino Dick M.D.   On: 01/30/2014 11:48    Anti-infectives: Anti-infectives    Start     Dose/Rate Route Frequency Ordered Stop   01/31/14 1500  cefTRIAXone (ROCEPHIN) 1 g in dextrose 5 % 50 mL IVPB - Premix     1 g100 mL/hr over 30 Minutes Intravenous Every 24 hours 01/30/14 1743 02/02/14 1459   01/30/14 1445  cefTRIAXone (ROCEPHIN) 1 g in dextrose 5 % 50 mL IVPB     1 g100 mL/hr over 30 Minutes Intravenous  Once 01/30/14 1433 01/30/14 1535      Assessment/Plan: s/p * No surgery found * SBO secondary to possible neuroendocrine tumor at [facility] valve  Pathology pending. Continue NG suction/ IV hydration Probable surgery Monday or Tuesday - right hemicolectomy by Dr. Zella Richer  LOS: 1 day    Jamian Andujo K. 01/31/2014

## 2014-02-01 LAB — GLUCOSE, CAPILLARY
GLUCOSE-CAPILLARY: 92 mg/dL (ref 70–99)
Glucose-Capillary: 67 mg/dL — ABNORMAL LOW (ref 70–99)
Glucose-Capillary: 128 mg/dL — ABNORMAL HIGH (ref 70–99)
Glucose-Capillary: 128 mg/dL — ABNORMAL HIGH (ref 70–99)
Glucose-Capillary: 149 mg/dL — ABNORMAL HIGH (ref 70–99)

## 2014-02-01 MED ORDER — DEXTROSE-NACL 5-0.45 % IV SOLN
INTRAVENOUS | Status: DC
  Administered 2014-02-01: 100 mL/h via INTRAVENOUS

## 2014-02-01 MED ORDER — KCL IN DEXTROSE-NACL 20-5-0.45 MEQ/L-%-% IV SOLN
INTRAVENOUS | Status: DC
  Administered 2014-02-01: 100 mL/h via INTRAVENOUS
  Administered 2014-02-02 – 2014-02-03 (×3): via INTRAVENOUS
  Filled 2014-02-01 (×7): qty 1000

## 2014-02-01 NOTE — Progress Notes (Signed)
Patient ID: Jessica Farrell, female   DOB: [birth year], 68 y.o.   MRN: [id number]  [facility] - Progress Note  Subjective: Patient comfortable, son at bedside.  Mild intermittent pain.  One episode of flatus this AM.  Objective: Vital signs in last 24 hours: Temp:  [98.5 F (36.9 C)-98.9 F (37.2 C)] 98.5 F (36.9 C) (12/13 0548) Pulse Rate:  [93-104] 93 (12/13 0548) Resp:  [18] 18 (12/13 0548) BP: (109-128)/(64-70) 128/64 mmHg (12/13 0548) SpO2:  [92 %-94 %] 93 % (12/13 0548) Last BM Date: 01/28/14  Intake/Output from previous day: 12/12 0701 - 12/13 0700 In: 2400 [I.V.:2400] Out: 3300 [Urine:2900; Emesis/NG output:400]  Exam: HEENT - clear, not icteric Neck - soft Chest - clear bilaterally Cor - RRR, no murmur Abd - softer, less distended; BS present; bilious in NG - moderate output Ext - no significant edema Neuro - grossly intact, no focal deficits  Lab Results:   Recent Labs  01/30/14 1124 01/31/14 0520  WBC 7.5 6.0  HGB 13.8 11.9*  HCT 41.4 37.6  PLT 255 222     Recent Labs  01/30/14 1124 01/31/14 0520  NA 143 140  K 4.6 4.1  CL 104 105  CO2 24 22  GLUCOSE 118* 113*  BUN 15 13  CREATININE 0.71 0.74  CALCIUM 9.4 8.3*    Studies/Results: Ct Abdomen Pelvis W Contrast  01/30/2014   CLINICAL DATA:  68 year old female with epigastric abdominal pain for 1 month. Distal small bowel obstruction related to soft tissue lesion near the ileocecal valve. Status post colonoscopy 2 days ago with biopsy, pathology pending. Severe pain. Subsequent encounter.  EXAM: CT ABDOMEN AND PELVIS WITH CONTRAST  TECHNIQUE: Multidetector CT imaging of the abdomen and pelvis was performed using the standard protocol following bolus administration of intravenous contrast.  CONTRAST:  74mL OMNIPAQUE IOHEXOL 300 MG/ML SOLN, 157mL OMNIPAQUE IOHEXOL 300 MG/ML SOLN  COMPARISON:  CT Abdomen and Pelvis 01/21/2014.  FINDINGS: Lower lung volumes.  Increased, but still mild basilar atelectasis. No pericardial or pleural effusion.  No acute osseous abnormality identified.  Trace pelvic free fluid is new. Decompressed distal colon. Unremarkable bladder.  Uterus and adnexa appear stable and within normal limits; the right adnexa is in proximity to the abnormal small bowel in the right lower quadrant.  Decompressed sigmoid colon. Mild diverticulosis of the left colon which does contain some gas. Some gas and stool also present in the transverse colon. The right colon remains completely decompressed.  Partially enhancing obstructing lesion re- identified in the terminal ileum just proximal to the ileocecal valve (series 2, image 56), configuration not changed. Adjacent 12 mm mesenteric node re- identified and stable. No extra luminal gas. No free intraperitoneal air. Upstream small bowel remains dilated. Dilated loops up to 44 mm diameter (previously 45 mm). Increased wall thickening of some of the distal dilated loops (series 2, image 65, wall thickening up to 10 mm. Increased enter loop fluid. Mildly increased distal small bowel mesenteric stranding.  Gradual transition to nondilated proximal jejunum. Oral contrast in the stomach. Negative duodenum. Small volume of free fluid now in the right gutter.  Trace perihepatic and perisplenic fluid is new. Liver, gallbladder, spleen, pancreas, adrenal glands, portal venous system, and kidneys remain within normal limits. Mild Aortoiliac calcified atherosclerosis noted. Major arterial structures in the abdomen and pelvis are patent.  IMPRESSION: 1. Persistent moderately high-grade small bowel obstruction related to the terminal ileum mass. Interval increased inflammation of the dilated loops  in the right lower quadrant, now with wall thickening and free fluid within the leaves of the mesentery. Small volume free fluid in the right gutter, and adjacent to the liver and spleen. 2. No free air.  No extraluminal air  identified. 3. No new abnormality in the abdomen or pelvis.   Electronically Signed   By: Lars Pinks M.D.   On: 01/30/2014 14:47   Dg Abd Acute W/chest  01/30/2014   CLINICAL DATA:  Abdominal pain.  EXAM: ACUTE ABDOMEN SERIES (ABDOMEN 2 VIEW & CHEST 1 VIEW)  COMPARISON:  May 05, 2012.  FINDINGS: There is no evidence of dilated bowel loops or free intraperitoneal air. No radiopaque calculi or other significant radiographic abnormality is seen. Heart size and mediastinal contours are within normal limits. Both lungs are clear.  IMPRESSION: Negative abdominal radiographs.  No acute cardiopulmonary disease.   Electronically Signed   By: Sabino Dick M.D.   On: 01/30/2014 11:48    Assessment / Plan: 1.  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 Neuroendocrine tumor of small intestine  Will need resection - likely to be scheduled on Tuesday 12/15  Continue NG decompression, IV hydration  Discussed with patient and family this morning  Earnstine Regal, MD, [facility] Office: [phone]  02/01/2014

## 2014-02-01 NOTE — Progress Notes (Signed)
Hypoglycemic Event  CBG: 67  Treatment: iv fluids changed per md  Symptoms: None  Follow-up CBG: Time:8:50 CBG Result:92  Possible Reasons for Event: Other: npo  Comments/MD notified:8:04    Joann, Jorge  Remember to initiate Hypoglycemia Order Set & complete

## 2014-02-02 LAB — GLUCOSE, CAPILLARY
Glucose-Capillary: 160 mg/dL — ABNORMAL HIGH (ref 70–99)
Glucose-Capillary: 148 mg/dL — ABNORMAL HIGH (ref 70–99)
Glucose-Capillary: 146 mg/dL — ABNORMAL HIGH (ref 70–99)
Glucose-Capillary: 160 mg/dL — ABNORMAL HIGH (ref 70–99)

## 2014-02-02 MED ORDER — DEXTROSE 5 % IV SOLN
2.0000 g | INTRAVENOUS | Status: DC
  Filled 2014-02-02: qty 2

## 2014-02-02 NOTE — Progress Notes (Signed)
Subjective: She is congested and would like the NG out.  Has not been able to eat since 01/29/14.  Sick for 2 months prior to that  Objective: Vital signs in last 24 hours: Temp:  [98.2 F (36.8 C)-98.9 F (37.2 C)] 98.3 F (36.8 C) (12/14 0420) Pulse Rate:  [96-99] 99 (12/14 0420) Resp:  [18] 18 (12/14 0420) BP: (122-133)/(70-74) 128/70 mmHg (12/14 0420) SpO2:  [94 %-95 %] 95 % (12/14 0420) Last BM Date: 02/01/14 600 from the NG 2 stools yesterday and one today recorded. Afebrile, VSS No labs  Intake/Output from previous day: 12/13 0701 - 12/14 0700 In: 1448.3 [I.V.:1448.3] Out: 2750 [Urine:2150; Emesis/NG output:600] Intake/Output this shift: Total I/O In: -  Out: 525 [Urine:400; Emesis/NG output:125]  General appearance: alert, cooperative and no distress Resp: clear to auscultation bilaterally GI: soft minimally tender, says one place mid right abdomen hurts at times.  No nausea, NG cannister is full.  Lab Results:   Recent Labs  01/30/14 1124 01/31/14 0520  WBC 7.5 6.0  HGB 13.8 11.9*  HCT 41.4 37.6  PLT 255 222    BMET  Recent Labs  01/30/14 1124 01/31/14 0520  NA 143 140  K 4.6 4.1  CL 104 105  CO2 24 22  GLUCOSE 118* 113*  BUN 15 13  CREATININE 0.71 0.74  CALCIUM 9.4 8.3*   PT/INR No results for input(s): LABPROT, INR in the last 72 hours.   Recent Labs Lab 01/30/14 1124  AST 13  ALT 13  ALKPHOS 136*  BILITOT 0.3  PROT 7.0  ALBUMIN 3.5     Lipase     Component Value Date/Time   LIPASE 20 01/30/2014 1124     Studies/Results: No results found.  Medications: . enoxaparin (LOVENOX) injection  40 mg Subcutaneous Q24H  . insulin aspart  0-9 Units Subcutaneous TID WC  . levothyroxine  12.5 mcg Intravenous Daily  . pantoprazole (PROTONIX) IV  40 mg Intravenous QHS   . dextrose 5 % and 0.45 % NaCl with KCl 20 mEq/L 100 mL/hr at 02/02/14 0134   Prior to Admission medications   Medication Sig Start Date End Date Taking?  Authorizing Provider  Albuterol Sulfate 108 (90 BASE) MCG/ACT AEPB Inhale 2 puffs into the lungs every 4 (four) hours as needed (wheezing/SOB).    Yes Historical Provider, MD  atorvastatin (LIPITOR) 10 MG tablet Take 10 mg by mouth daily.   Yes Historical Provider, MD  dicyclomine (BENTYL) 10 MG capsule Take 3 times daily as needed for abdominal pain Patient taking differently: Take 10 mg by mouth 3 (three) times daily as needed for spasms. Take 3 times daily as needed for abdominal pain 01/19/14  Yes Amy S Esterwood, PA-C  levothyroxine (SYNTHROID, LEVOTHROID) 25 MCG tablet Take 1 tablet (25 mcg total) by mouth daily before breakfast. 05/09/12  Yes Shanker Kristeen Mans, MD  metFORMIN (GLUCOPHAGE) 500 MG tablet Take 1 tablet (500 mg total) by mouth 2 (two) times daily with a meal. 05/09/12  Yes Shanker Kristeen Mans, MD  ondansetron (ZOFRAN) 4 MG tablet Take 1 tab every 6 hours as needed for nausea. 01/22/14  Yes Amy S Esterwood, PA-C  traMADol (ULTRAM) 50 MG tablet Take 1 tablet (50 mg total) by mouth every 6 (six) hours as needed. 01/22/14  Yes Amy S Esterwood, PA-C     Assessment/Plan 1.  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 Ileocecal valve, biopsy, mass  - ILEOCOLONIC MUCOSA WITH PROMINENT LYMPHOID  HYPERPLASIA, PROMINENT  SUBEPITHELIAL LYMPHATICS AND RARE ATYPICAL EPITHELIOID  CELLS, SEE COMMENT.  2. Colon, biopsy, ascending  - FRAGMENTS OF TUBULAR ADENOMA (X1); NEGATIVE FOR HIGH  GRADE DYSPLASIA OR  MALIGNANCY. 2.  Cellulitis of left lower leg 3.  Diabetes mellitus 4.  Hx of  AKI (acute kidney injury) 5.  Hypothyroidism 6.   UTI (lower urinary tract infection) 7.  Hx of Asthma   Plan:  I will start getting her ready for surgery tomorrow.  I have told her path at this point is negative.       LOS: 3 days    Matson Welch 02/02/2014

## 2014-02-02 NOTE — Anesthesia Preprocedure Evaluation (Signed)
Anesthesia Evaluation  Patient identified by MRN, date of birth, ID band Patient awake    Reviewed: Allergy & Precautions, H&P , NPO status , Patient's Chart, lab work & pertinent test results  History of Anesthesia Complications Negative for: history of anesthetic complications  Airway Mallampati: II  TM Distance: >3 FB Neck ROM: Full    Dental no notable dental hx. (+) Dental Advisory Given   Pulmonary asthma ,  breath sounds clear to auscultation  Pulmonary exam normal       Cardiovascular + Peripheral Vascular Disease Rhythm:Regular Rate:Normal     Neuro/Psych negative neurological ROS  negative psych ROS   GI/Hepatic negative GI ROS, Neg liver ROS,   Endo/Other  diabetes, Type 2, Oral Hypoglycemic AgentsHypothyroidism   Renal/GU negative Renal ROS  negative genitourinary   Musculoskeletal negative musculoskeletal ROS (+)   Abdominal   Peds negative pediatric ROS (+)  Hematology negative hematology ROS (+)   Anesthesia Other Findings   Reproductive/Obstetrics negative OB ROS                             Anesthesia Physical Anesthesia Plan  ASA: II  Anesthesia Plan: General   Post-op Pain Management:    Induction: Intravenous  Airway Management Planned: Oral ETT  Additional Equipment:   Intra-op Plan:   Post-operative Plan: Extubation in OR  Informed Consent: I have reviewed the patients History and Physical, chart, labs and discussed the procedure including the risks, benefits and alternatives for the proposed anesthesia with the patient or authorized representative who has indicated his/her understanding and acceptance.   Dental advisory given  Plan Discussed with: CRNA  Anesthesia Plan Comments:         Anesthesia Quick Evaluation

## 2014-02-03 ENCOUNTER — Inpatient Hospital Stay (HOSPITAL_COMMUNITY): Payer: Medicare Other | Admitting: Anesthesiology

## 2014-02-03 ENCOUNTER — Encounter (HOSPITAL_COMMUNITY): Admission: EM | Disposition: A | Discharge status: Home / Self Care | Payer: Self-pay | Source: Home / Self Care

## 2014-02-03 ENCOUNTER — Encounter (HOSPITAL_COMMUNITY): Payer: Self-pay | Admitting: Anesthesiology

## 2014-02-03 HISTORY — PX: COLON RESECTION: SHX5231

## 2014-02-03 LAB — URINE CULTURE
COLONY COUNT: NO GROWTH
Culture: NO GROWTH

## 2014-02-03 LAB — COMPREHENSIVE METABOLIC PANEL
ALK PHOS: 118 U/L — AB (ref 39–117)
ALT: 7 U/L (ref 0–35)
AST: 11 U/L (ref 0–37)
Albumin: 2.9 g/dL — ABNORMAL LOW (ref 3.5–5.2)
Anion gap: 11 (ref 5–15)
BUN: 3 mg/dL — ABNORMAL LOW (ref 6–23)
CHLORIDE: 103 meq/L (ref 96–112)
CO2: 25 mEq/L (ref 19–32)
CREATININE: 0.59 mg/dL (ref 0.50–1.10)
Calcium: 8.9 mg/dL (ref 8.4–10.5)
GFR calc Af Amer: >90 mL/min (ref >90)
Glucose, Bld: 155 mg/dL — ABNORMAL HIGH (ref 70–99)
Potassium: 3.9 mEq/L (ref 3.7–5.3)
Sodium: 139 mEq/L (ref 137–147)
Total Bilirubin: 0.2 mg/dL — ABNORMAL LOW (ref 0.3–1.2)
Total Protein: 6.4 g/dL (ref 6.0–8.3)

## 2014-02-03 LAB — GLUCOSE, CAPILLARY
GLUCOSE-CAPILLARY: 146 mg/dL — AB (ref 70–99)
GLUCOSE-CAPILLARY: 174 mg/dL — AB (ref 70–99)
GLUCOSE-CAPILLARY: 172 mg/dL — AB (ref 70–99)
Glucose-Capillary: 139 mg/dL — ABNORMAL HIGH (ref 70–99)
Glucose-Capillary: 150 mg/dL — ABNORMAL HIGH (ref 70–99)

## 2014-02-03 LAB — CBC
HEMATOCRIT: 37.5 % (ref 36.0–46.0)
Hemoglobin: 12 g/dL (ref 12.0–15.0)
MCH: 28.6 pg (ref 26.0–34.0)
MCHC: 32 g/dL (ref 30.0–36.0)
MCV: 89.5 fL (ref 78.0–100.0)
Platelets: 246 10*3/uL (ref 150–400)
RBC: 4.19 MIL/uL (ref 3.87–5.11)
RDW: 13.4 % (ref 11.5–15.5)
WBC: 8.2 10*3/uL (ref 4.0–10.5)

## 2014-02-03 LAB — ABO/RH: ABO/RH(D): A POS

## 2014-02-03 LAB — TYPE AND SCREEN
ABO/RH(D): A POS
ANTIBODY SCREEN: NEGATIVE

## 2014-02-03 LAB — PROTIME-INR
INR: 1.04 (ref 0.00–1.49)
PROTHROMBIN TIME: 13.7 s (ref 11.6–15.2)

## 2014-02-03 LAB — SURGICAL PCR SCREEN
MRSA, PCR: NEGATIVE
Staphylococcus aureus: POSITIVE — AB

## 2014-02-03 LAB — APTT: aPTT: 30 seconds (ref 24–37)

## 2014-02-03 LAB — PREALBUMIN: PREALBUMIN: 11.4 mg/dL — AB (ref 17.0–34.0)

## 2014-02-03 SURGERY — LAPAROSCOPIC RIGHT COLON RESECTION
Anesthesia: General

## 2014-02-03 MED ORDER — DEXAMETHASONE SODIUM PHOSPHATE 10 MG/ML IJ SOLN
INTRAMUSCULAR | Status: DC | PRN
  Administered 2014-02-03: 10 mg via INTRAVENOUS

## 2014-02-03 MED ORDER — PHENYLEPHRINE HCL 10 MG/ML IJ SOLN
INTRAMUSCULAR | Status: DC | PRN
  Administered 2014-02-03: 80 ug via INTRAVENOUS

## 2014-02-03 MED ORDER — NALOXONE HCL 0.4 MG/ML IJ SOLN: 0.4000 mg | INTRAMUSCULAR | Status: DC | PRN

## 2014-02-03 MED ORDER — BUPIVACAINE-EPINEPHRINE 0.5% -1:200000 IJ SOLN
INTRAMUSCULAR | Status: AC
  Filled 2014-02-03: qty 1

## 2014-02-03 MED ORDER — CISATRACURIUM BESYLATE 20 MG/10ML IV SOLN
INTRAVENOUS | Status: AC
  Filled 2014-02-03: qty 10

## 2014-02-03 MED ORDER — LEVOTHYROXINE SODIUM 100 MCG IV SOLR
12.5000 ug | Freq: Every day | INTRAVENOUS | Status: DC
Start: 2014-02-03 — End: 2014-02-08
  Administered 2014-02-03 – 2014-02-08 (×6): 12.5 ug via INTRAVENOUS
  Filled 2014-02-03 (×6): qty 5

## 2014-02-03 MED ORDER — FENTANYL CITRATE 0.05 MG/ML IJ SOLN
INTRAMUSCULAR | Status: AC
  Filled 2014-02-03: qty 2

## 2014-02-03 MED ORDER — EPHEDRINE SULFATE 50 MG/ML IJ SOLN
INTRAMUSCULAR | Status: AC
  Filled 2014-02-03: qty 1

## 2014-02-03 MED ORDER — DEXAMETHASONE SODIUM PHOSPHATE 10 MG/ML IJ SOLN
INTRAMUSCULAR | Status: AC
  Filled 2014-02-03: qty 1

## 2014-02-03 MED ORDER — PROPOFOL 10 MG/ML IV BOLUS
INTRAVENOUS | Status: AC
  Filled 2014-02-03: qty 20

## 2014-02-03 MED ORDER — HYDROMORPHONE HCL 1 MG/ML IJ SOLN
INTRAMUSCULAR | Status: DC | PRN
  Administered 2014-02-03 (×3): .4 mg via INTRAVENOUS

## 2014-02-03 MED ORDER — GLYCOPYRROLATE 0.2 MG/ML IJ SOLN
INTRAMUSCULAR | Status: AC
  Filled 2014-02-03: qty 3

## 2014-02-03 MED ORDER — CEFOTETAN DISODIUM 2 G IJ SOLR
2.0000 g | Freq: Once | INTRAMUSCULAR | Status: AC
  Administered 2014-02-03: 2 g via INTRAVENOUS
  Filled 2014-02-03: qty 2

## 2014-02-03 MED ORDER — HYDROMORPHONE HCL 2 MG/ML IJ SOLN
INTRAMUSCULAR | Status: AC
  Filled 2014-02-03: qty 1

## 2014-02-03 MED ORDER — PHENYLEPHRINE 40 MCG/ML (10ML) SYRINGE FOR IV PUSH (FOR BLOOD PRESSURE SUPPORT)
PREFILLED_SYRINGE | INTRAVENOUS | Status: AC
  Filled 2014-02-03: qty 10

## 2014-02-03 MED ORDER — KCL IN DEXTROSE-NACL 20-5-0.45 MEQ/L-%-% IV SOLN
INTRAVENOUS | Status: DC
Start: 2014-02-03 — End: 2014-02-05
  Administered 2014-02-03 – 2014-02-04 (×3): via INTRAVENOUS
  Administered 2014-02-04: 100 mL/h via INTRAVENOUS
  Administered 2014-02-05: 10:00:00 via INTRAVENOUS
  Filled 2014-02-03 (×6): qty 1000

## 2014-02-03 MED ORDER — SUFENTANIL CITRATE 50 MCG/ML IV SOLN
INTRAVENOUS | Status: AC
  Filled 2014-02-03: qty 1

## 2014-02-03 MED ORDER — GLYCOPYRROLATE 0.2 MG/ML IJ SOLN
INTRAMUSCULAR | Status: DC | PRN
  Administered 2014-02-03: 0.6 mg via INTRAVENOUS

## 2014-02-03 MED ORDER — ENOXAPARIN SODIUM 40 MG/0.4ML ~~LOC~~ SOLN
40.0000 mg | SUBCUTANEOUS | Status: DC
  Filled 2014-02-03 (×2): qty 0.4

## 2014-02-03 MED ORDER — ONDANSETRON HCL 4 MG/2ML IJ SOLN
INTRAMUSCULAR | Status: DC | PRN
  Administered 2014-02-03: 4 mg via INTRAVENOUS

## 2014-02-03 MED ORDER — ALBUTEROL SULFATE (2.5 MG/3ML) 0.083% IN NEBU
INHALATION_SOLUTION | RESPIRATORY_TRACT | Status: AC
  Filled 2014-02-03: qty 3

## 2014-02-03 MED ORDER — DIPHENHYDRAMINE HCL 12.5 MG/5ML PO ELIX: 12.5000 mg | ORAL_SOLUTION | Freq: Four times a day (QID) | ORAL | Status: DC | PRN

## 2014-02-03 MED ORDER — MIDAZOLAM HCL 5 MG/5ML IJ SOLN
INTRAMUSCULAR | Status: DC | PRN
  Administered 2014-02-03: 2 mg via INTRAVENOUS

## 2014-02-03 MED ORDER — PROPOFOL 10 MG/ML IV BOLUS
INTRAVENOUS | Status: DC | PRN
  Administered 2014-02-03: 150 mg via INTRAVENOUS

## 2014-02-03 MED ORDER — ONDANSETRON HCL 4 MG PO TABS: 4.0000 mg | ORAL_TABLET | Freq: Four times a day (QID) | ORAL | Status: DC | PRN

## 2014-02-03 MED ORDER — PANTOPRAZOLE SODIUM 40 MG IV SOLR
40.0000 mg | INTRAVENOUS | Status: DC
Start: 2014-02-03 — End: 2014-02-08
  Administered 2014-02-03 – 2014-02-07 (×5): 40 mg via INTRAVENOUS
  Filled 2014-02-03 (×6): qty 40

## 2014-02-03 MED ORDER — SUFENTANIL CITRATE 50 MCG/ML IV SOLN
INTRAVENOUS | Status: DC | PRN
  Administered 2014-02-03: 10 ug via INTRAVENOUS
  Administered 2014-02-03: 15 ug via INTRAVENOUS
  Administered 2014-02-03: 10 ug via INTRAVENOUS
  Administered 2014-02-03: 15 ug via INTRAVENOUS

## 2014-02-03 MED ORDER — CHLORHEXIDINE GLUCONATE CLOTH 2 % EX PADS
6.0000 | MEDICATED_PAD | Freq: Every day | CUTANEOUS | Status: DC
  Administered 2014-02-03: 6 via TOPICAL

## 2014-02-03 MED ORDER — CISATRACURIUM BESYLATE (PF) 10 MG/5ML IV SOLN
INTRAVENOUS | Status: DC | PRN
Start: 2014-02-03 — End: 2014-02-03
  Administered 2014-02-03: 6 mg via INTRAVENOUS
  Administered 2014-02-03: 2 mg via INTRAVENOUS
  Administered 2014-02-03: 4 mg via INTRAVENOUS
  Administered 2014-02-03 (×2): 2 mg via INTRAVENOUS

## 2014-02-03 MED ORDER — MIDAZOLAM HCL 2 MG/2ML IJ SOLN
INTRAMUSCULAR | Status: AC
  Filled 2014-02-03: qty 2

## 2014-02-03 MED ORDER — ONDANSETRON HCL 4 MG/2ML IJ SOLN: 4.0000 mg | INTRAMUSCULAR | Status: DC | PRN

## 2014-02-03 MED ORDER — NEOSTIGMINE METHYLSULFATE 10 MG/10ML IV SOLN
INTRAVENOUS | Status: DC | PRN
  Administered 2014-02-03: 4 mg via INTRAVENOUS

## 2014-02-03 MED ORDER — CHLORHEXIDINE GLUCONATE CLOTH 2 % EX PADS
6.0000 | MEDICATED_PAD | Freq: Every day | CUTANEOUS | Status: AC
  Administered 2014-02-03 – 2014-02-07 (×5): 6 via TOPICAL

## 2014-02-03 MED ORDER — CEFOTETAN DISODIUM-DEXTROSE 2-2.08 GM-% IV SOLR
INTRAVENOUS | Status: AC
  Filled 2014-02-03: qty 50

## 2014-02-03 MED ORDER — LACTATED RINGERS IV SOLN
INTRAVENOUS | Status: DC
  Administered 2014-02-03: 1000 mL via INTRAVENOUS
  Administered 2014-02-03 (×2): via INTRAVENOUS

## 2014-02-03 MED ORDER — DIPHENHYDRAMINE HCL 50 MG/ML IJ SOLN: 12.5000 mg | Freq: Four times a day (QID) | INTRAMUSCULAR | Status: DC | PRN

## 2014-02-03 MED ORDER — FENTANYL CITRATE 0.05 MG/ML IJ SOLN
25.0000 ug | INTRAMUSCULAR | Status: DC | PRN
  Administered 2014-02-03: 50 ug via INTRAVENOUS

## 2014-02-03 MED ORDER — SUCCINYLCHOLINE CHLORIDE 20 MG/ML IJ SOLN
INTRAMUSCULAR | Status: DC | PRN
  Administered 2014-02-03: 100 mg via INTRAVENOUS

## 2014-02-03 MED ORDER — ONDANSETRON HCL 4 MG/2ML IJ SOLN: 4.0000 mg | Freq: Once | INTRAMUSCULAR | Status: DC | PRN

## 2014-02-03 MED ORDER — LIDOCAINE HCL (CARDIAC) 20 MG/ML IV SOLN
INTRAVENOUS | Status: DC | PRN
  Administered 2014-02-03: 100 mg via INTRAVENOUS

## 2014-02-03 MED ORDER — SODIUM CHLORIDE 0.9 % IJ SOLN
9.0000 mL | INTRAMUSCULAR | Status: DC | PRN
Start: 2014-02-03 — End: 2014-02-06

## 2014-02-03 MED ORDER — MORPHINE SULFATE (PF) 1 MG/ML IV SOLN
INTRAVENOUS | Status: AC
  Filled 2014-02-03: qty 25

## 2014-02-03 MED ORDER — MUPIROCIN 2 % EX OINT
1.0000 "application " | TOPICAL_OINTMENT | Freq: Two times a day (BID) | CUTANEOUS | Status: DC
  Administered 2014-02-03: 1 via NASAL

## 2014-02-03 MED ORDER — MUPIROCIN 2 % EX OINT
1.0000 "application " | TOPICAL_OINTMENT | Freq: Two times a day (BID) | CUTANEOUS | Status: AC
  Administered 2014-02-03 – 2014-02-08 (×10): 1 via NASAL
  Filled 2014-02-03: qty 22

## 2014-02-03 MED ORDER — INSULIN ASPART 100 UNIT/ML ~~LOC~~ SOLN
0.0000 [IU] | SUBCUTANEOUS | Status: DC
  Administered 2014-02-03 (×2): 3 [IU] via SUBCUTANEOUS
  Administered 2014-02-04: 2 [IU] via SUBCUTANEOUS
  Administered 2014-02-04: 3 [IU] via SUBCUTANEOUS
  Administered 2014-02-04 (×4): 2 [IU] via SUBCUTANEOUS
  Administered 2014-02-05: 3 [IU] via SUBCUTANEOUS
  Administered 2014-02-05 – 2014-02-06 (×4): 2 [IU] via SUBCUTANEOUS
  Administered 2014-02-07 (×2): 3 [IU] via SUBCUTANEOUS
  Administered 2014-02-07: 2 [IU] via SUBCUTANEOUS
  Administered 2014-02-08: 3 [IU] via SUBCUTANEOUS

## 2014-02-03 MED ORDER — LIDOCAINE HCL (CARDIAC) 20 MG/ML IV SOLN
INTRAVENOUS | Status: AC
  Filled 2014-02-03: qty 5

## 2014-02-03 MED ORDER — ONDANSETRON HCL 4 MG/2ML IJ SOLN
INTRAMUSCULAR | Status: AC
  Filled 2014-02-03: qty 2

## 2014-02-03 MED ORDER — SODIUM CHLORIDE 0.9 % IJ SOLN
INTRAMUSCULAR | Status: AC
  Filled 2014-02-03: qty 10

## 2014-02-03 MED ORDER — MORPHINE SULFATE (PF) 1 MG/ML IV SOLN
INTRAVENOUS | Status: DC
  Administered 2014-02-03: 1.5 mg via INTRAVENOUS
  Administered 2014-02-03: 6 mg via INTRAVENOUS
  Administered 2014-02-03: 1 mg via INTRAVENOUS
  Administered 2014-02-04: 3 mg via INTRAVENOUS
  Administered 2014-02-04: 4.5 mg via INTRAVENOUS
  Administered 2014-02-04: 06:00:00 via INTRAVENOUS
  Administered 2014-02-04: 7.5 mg via INTRAVENOUS
  Administered 2014-02-04: 3 mg via INTRAVENOUS
  Administered 2014-02-04: 1.5 mg via INTRAVENOUS
  Administered 2014-02-04: 9 mg via INTRAVENOUS
  Administered 2014-02-05: 1.5 mg via INTRAVENOUS
  Filled 2014-02-03: qty 25

## 2014-02-03 MED ORDER — NEOSTIGMINE METHYLSULFATE 10 MG/10ML IV SOLN
INTRAVENOUS | Status: AC
  Filled 2014-02-03: qty 1

## 2014-02-03 MED ORDER — ALBUTEROL SULFATE (2.5 MG/3ML) 0.083% IN NEBU
2.5000 mg | INHALATION_SOLUTION | Freq: Once | RESPIRATORY_TRACT | Status: AC
  Administered 2014-02-03: 2.5 mg via RESPIRATORY_TRACT

## 2014-02-03 MED ORDER — ONDANSETRON HCL 4 MG/2ML IJ SOLN
4.0000 mg | Freq: Four times a day (QID) | INTRAMUSCULAR | Status: DC | PRN
Start: 2014-02-03 — End: 2014-02-06
  Administered 2014-02-04: 4 mg via INTRAVENOUS
  Filled 2014-02-03: qty 2

## 2014-02-03 SURGICAL SUPPLY — 67 items
APPLIER CLIP 5 13 M/L LIGAMAX5 (MISCELLANEOUS)
APPLIER CLIP ROT 10 11.4 M/L (STAPLE)
BLADE EXTENDED COATED 6.5IN (ELECTRODE) IMPLANT
BLADE HEX COATED 2.75 (ELECTRODE) ×6 IMPLANT
CABLE HIGH FREQUENCY MONO STRZ (ELECTRODE) ×3 IMPLANT
CANISTER SUCT 3000ML (MISCELLANEOUS) ×3 IMPLANT
CELLS DAT CNTRL 66122 CELL SVR (MISCELLANEOUS) IMPLANT
CLIP APPLIE 5 13 M/L LIGAMAX5 (MISCELLANEOUS) IMPLANT
CLIP APPLIE ROT 10 11.4 M/L (STAPLE) IMPLANT
DECANTER SPIKE VIAL GLASS SM (MISCELLANEOUS) ×3 IMPLANT
DISSECTOR BLUNT TIP ENDO 5MM (MISCELLANEOUS) IMPLANT
DRAPE LAPAROSCOPIC ABDOMINAL (DRAPES) ×3 IMPLANT
DRAPE UTILITY XL STRL (DRAPES) ×6 IMPLANT
DRAPE WARM FLUID 44X44 (DRAPE) ×3 IMPLANT
DRSG OPSITE POSTOP 4X6 (GAUZE/BANDAGES/DRESSINGS) ×3 IMPLANT
ELECT REM PT RETURN 9FT ADLT (ELECTROSURGICAL) ×3
ELECTRODE REM PT RTRN 9FT ADLT (ELECTROSURGICAL) ×1 IMPLANT
FILTER SMOKE EVAC LAPAROSHD (FILTER) IMPLANT
GAUZE SPONGE 4X4 12PLY STRL (GAUZE/BANDAGES/DRESSINGS) ×3 IMPLANT
GLOVE BIOGEL PI IND STRL 7.0 (GLOVE) ×4 IMPLANT
GLOVE BIOGEL PI INDICATOR 7.0 (GLOVE) ×8
GLOVE ECLIPSE 8.0 STRL XLNG CF (GLOVE) ×9 IMPLANT
GLOVE INDICATOR 8.0 STRL GRN (GLOVE) ×9 IMPLANT
GOWN STRL REUS W/TWL LRG LVL3 (GOWN DISPOSABLE) ×3 IMPLANT
GOWN STRL REUS W/TWL XL LVL3 (GOWN DISPOSABLE) ×12 IMPLANT
KIT BASIN OR (CUSTOM PROCEDURE TRAY) ×3 IMPLANT
LEGGING LITHOTOMY PAIR STRL (DRAPES) IMPLANT
LIGASURE IMPACT 36 18CM CVD LR (INSTRUMENTS) IMPLANT
NS IRRIG 1000ML POUR BTL (IV SOLUTION) ×6 IMPLANT
PACK COLON (CUSTOM PROCEDURE TRAY) IMPLANT
PACK GENERAL/GYN (CUSTOM PROCEDURE TRAY) ×3 IMPLANT
RELOAD PROXIMATE 75MM BLUE (ENDOMECHANICALS) ×9 IMPLANT
RTRCTR WOUND ALEXIS 18CM MED (MISCELLANEOUS)
SCISSORS LAP 5X35 DISP (ENDOMECHANICALS) ×3 IMPLANT
SET IRRIG TUBING LAPAROSCOPIC (IRRIGATION / IRRIGATOR) ×3 IMPLANT
SHEARS HARMONIC ACE PLUS 36CM (ENDOMECHANICALS) ×3 IMPLANT
SLEEVE XCEL OPT CAN 5 100 (ENDOMECHANICALS) ×9 IMPLANT
SOLUTION ANTI FOG 6CC (MISCELLANEOUS) ×3 IMPLANT
SPONGE LAP 18X18 X RAY DECT (DISPOSABLE) ×6 IMPLANT
STAPLER PROXIMATE 75MM BLUE (STAPLE) ×3 IMPLANT
STAPLER VISISTAT 35W (STAPLE) ×3 IMPLANT
SUCTION POOLE TIP (SUCTIONS) ×3 IMPLANT
SUT PDS AB 1 CTX 36 (SUTURE) IMPLANT
SUT PDS AB 1 TP1 96 (SUTURE) IMPLANT
SUT PROLENE 2 0 SH DA (SUTURE) IMPLANT
SUT SILK 2 0 (SUTURE) ×2
SUT SILK 2 0 SH CR/8 (SUTURE) ×3 IMPLANT
SUT SILK 2-0 18XBRD TIE 12 (SUTURE) ×1 IMPLANT
SUT SILK 3 0 (SUTURE) ×2
SUT SILK 3 0 SH CR/8 (SUTURE) ×3 IMPLANT
SUT SILK 3-0 18XBRD TIE 12 (SUTURE) ×1 IMPLANT
SUT VIC AB 3-0 SH 27 (SUTURE) ×4
SUT VIC AB 3-0 SH 27XBRD (SUTURE) ×2 IMPLANT
SUT VICRYL 2 0 18  UND BR (SUTURE) ×2
SUT VICRYL 2 0 18 UND BR (SUTURE) ×1 IMPLANT
SYS LAPSCP GELPORT 120MM (MISCELLANEOUS) ×3
SYSTEM LAPSCP GELPORT 120MM (MISCELLANEOUS) ×1 IMPLANT
TOWEL OR 17X26 10 PK STRL BLUE (TOWEL DISPOSABLE) ×3 IMPLANT
TOWEL OR NON WOVEN STRL DISP B (DISPOSABLE) ×6 IMPLANT
TRAY FOLEY CATH 14FRSI W/METER (CATHETERS) ×3 IMPLANT
TROCAR BLADELESS OPT 5 100 (ENDOMECHANICALS) ×3 IMPLANT
TROCAR BLADELESS OPT 5 75 (ENDOMECHANICALS) IMPLANT
TROCAR XCEL BLUNT TIP 100MML (ENDOMECHANICALS) IMPLANT
TROCAR XCEL NON-BLD 11X100MML (ENDOMECHANICALS) IMPLANT
TROCAR XCEL UNIV SLVE 11M 100M (ENDOMECHANICALS) IMPLANT
TUBING INSUFFLATION 10FT LAP (TUBING) ×3 IMPLANT
YANKAUER SUCT BULB TIP NO VENT (SUCTIONS) ×3 IMPLANT

## 2014-02-03 NOTE — Transfer of Care (Signed)
Immediate Anesthesia Transfer of Care Note  Patient: Jessica Farrell  Procedure(s) Performed: Procedure(s): LAPAROSCOPIC ASSISTED BOWEL RESECTION (N/A)  Patient Location: PACU  Anesthesia Type:General  Level of Consciousness: awake, alert  and oriented  Airway & Oxygen Therapy: Patient Spontanous Breathing and Patient connected to face mask oxygen  Post-op Assessment: Report given to PACU RN and Post -op Vital signs reviewed and stable  Post vital signs: Reviewed and stable  Complications: No apparent anesthesia complications

## 2014-02-03 NOTE — Anesthesia Postprocedure Evaluation (Signed)
  Anesthesia Post-op Note  Patient: Jessica Farrell  Procedure(s) Performed: Procedure(s) (LRB): LAPAROSCOPIC ASSISTED BOWEL RESECTION (N/A)  Patient Location: PACU  Anesthesia Type: General  Level of Consciousness: awake and alert   Airway and Oxygen Therapy: Patient Spontanous Breathing  Post-op Pain: mild  Post-op Assessment: Post-op Vital signs reviewed, Patient's Cardiovascular Status Stable, Respiratory Function Stable, Patent Airway and No signs of Nausea or vomiting  Last Vitals:  Filed Vitals:   02/03/14 1430  BP: 113/71  Pulse: 90  Temp: 36.9 C  Resp: 12    Post-op Vital Signs: stable   Complications: No apparent anesthesia complications

## 2014-02-03 NOTE — Anesthesia Procedure Notes (Signed)
Procedure Name: Intubation Date/Time: 02/03/2014 10:54 AM Performed by: Danley Danker L Patient Re-evaluated:Patient Re-evaluated prior to inductionOxygen Delivery Method: Circle system utilized Preoxygenation: Pre-oxygenation with 100% oxygen Intubation Type: IV induction, Rapid sequence and Cricoid Pressure applied Laryngoscope Size: Miller and 2 Grade View: Grade I Tube type: Oral Tube size: 7.5 mm Number of attempts: 1 Airway Equipment and Method: Stylet Placement Confirmation: ETT inserted through vocal cords under direct vision,  positive ETCO2 and breath sounds checked- equal and bilateral Secured at: 21 cm Tube secured with: Tape Dental Injury: Teeth and Oropharynx as per pre-operative assessment

## 2014-02-03 NOTE — Op Note (Signed)
Operative Note  Jessica Farrell female 68 y.o. 02/03/2014  PREOPERATIVE DX:  Obstructing tumor ileocecal valve  POSTOPERATIVE DX:  Same  PROCEDURE:   Laparoscopic-assisted right colectomy and resection of distal ileum         Surgeon: [name]   Assistants: none  Anesthesia: General endotracheal anesthesia  Indications:   This is a 68 year old female noted have a right lower quadrant mass on CT scan done for abdominal pain. The CT demonstrated a tumor at the ileocecal valve area.Colonoscopy was performed and biopsies were obtained. She subsequently developed a high-grade small bowel obstruction due to the tumor and was admitted. She's been decompressed and is now brought to the operating room. Pathology demonstrates lymphocytic changes but no obvious malignancy at this time.    Procedure Detail:  She was brought to the operating room placed upon on the operative table and general anesthetic was given. A Foley catheter was inserted. A nasogastric tube was already in. The abdominal wall was widely sterilely prepped and draped.  She is placed in slight reverse Trendelenburg position. A 5 mm incision was made in the left upper quadrant subcostal region. Using a 5 mm Optiview trocar and laparoscope access was gained to the peritoneal cavity. Pneumoperitoneum was created. Inspection of the area under the trocar demonstrated no evidence of organ injury or bleeding.  5 mm trochars placed in the left mid abdomen and one in the subumbilical area. Identified the tumor at the ileocecal valve area and is very firm and indented. I began mobilizing the transverse colon by dividing attachments using the Harmonic scalpel. I then mobilized the ascending colon in the same fashion by dividing lateral attachments using Harmonic scalpel. I subsequently had mobilized the proximal half of transverse colon and the right colon as well as the small bowel so they could be brought to the midline.  An  extraction site incision was made using a periumbilical incision. The skin and subcutaneous tissue and fascia were divided and a limited midline incision was made here. A wound protection device was placed. I exteriorized the tumor. Multiple enlarged lymph nodes were noted in the mesentery. Enlarged lymph nodes were noted approximately 20 cm proximal to the ileocecal valve. There was dense scarring between the tumor and mesentery and this was carefully mobilized. Identified the gonadal vessels and kept the plane of dissection anterior to these. I divided the distal ileum with the GIA stapler. I then divided the transverse colon at the proximal third area with the GIA stapler. The mesentery was then divided with the LigaSure and the large lymph nodes were included. Specimen was handed off the field. Tumor appeared to be at the ileocecal valve area.  I inspected the distal ileum in this area slightly ischemic. There is also slightly dilated. Most of the ileum was slightly dilated. I resected a little bit more of the distal ileum back to area that was viable. A side to side anastomosis was then performed between the ileum and the transverse colon using the GIA stapler. There is no bleeding from the staple line. The common defect was closed in 2 layers. The first layer was a full-thickness running locking 3-0 Vicryl suture. The second layer was interrupted 3-0 silk sutures in a Lembert type fashion. A crotch stitch of 3-0 silk was placed. The anastomosis was patent, viable, under no tension, and demonstrated no evidence of leak.  The bowel was then placed back into the abdominal cavity. Copious irrigation was performed. The fascia was closed with a  running #1 PDS suture. Laparoscopy was then performed in a 4 quadrant inspection demonstrated no evidence of bleeding or organ injury. There is some omentum caught up in the midline incision which was retracted. I then released the pneumoperitoneum and removed the  trocars. All skin incisions were closed with staples. Sterile dressings were applied.  She tolerated the procedure well without any apparent complications and was taken to the recovery room in satisfactory condition.  Estimated Blood Loss:  300 mL         Drains: none          Specimens: right colon and distal ileum        Complications:  * No complications entered in OR log *         Disposition: PACU - hemodynamically stable.         Condition: stable

## 2014-02-03 NOTE — Progress Notes (Signed)
  Subjective: For surgery today, no complaints, says she was hungry last PM.  Objective: Vital signs in last 24 hours: Temp:  [98 F (36.7 C)-98.9 F (37.2 C)] 98 F (36.7 C) (12/15 0505) Pulse Rate:  [78-97] 84 (12/15 0505) Resp:  [18-20] 18 (12/15 0505) BP: (119-131)/(66-77) 126/66 mmHg (12/15 0505) SpO2:  [94 %-95 %] 94 % (12/15 0505) Last BM Date: 02/02/14 575 from the NG Afebrile, VSS Labs are all fine, glucose and alk phos up slightly, Albumin down some Intake/Output from previous day: 12/14 0701 - 12/15 0700 In: 1600 [I.V.:1600] Out: 2275 [Urine:1700; Emesis/NG output:575] Intake/Output this shift:    General appearance: alert, cooperative and no distress Resp: clear to auscultation bilaterally GI: soft, non-tender; bowel sounds normal; no masses,  no organomegaly  Lab Results:   Recent Labs  02/03/14 0400  WBC 8.2  HGB 12.0  HCT 37.5  PLT 246    BMET  Recent Labs  02/03/14 0400  NA 139  K 3.9  CL 103  CO2 25  GLUCOSE 155*  BUN 3*  CREATININE 0.59  CALCIUM 8.9   PT/INR  Recent Labs  02/03/14 0400  LABPROT 13.7  INR 1.04     Recent Labs Lab 01/30/14 1124 02/03/14 0400  AST 13 11  ALT 13 7  ALKPHOS 136* 118*  BILITOT 0.3 0.2*  PROT 7.0 6.4  ALBUMIN 3.5 2.9*     Lipase     Component Value Date/Time   LIPASE 20 01/30/2014 1124     Studies/Results: No results found.  Medications: . insulin aspart  0-9 Units Subcutaneous TID WC  . levothyroxine  12.5 mcg Intravenous Daily  . pantoprazole (PROTONIX) IV  40 mg Intravenous QHS    Assessment/Plan 1. Small bowel obstruction secondary to ileal tumor 1. Ileocecal valve, biopsy, mass - ILEOCOLONIC MUCOSA WITH PROMINENT LYMPHOID HYPERPLASIA, PROMINENT SUBEPITHELIAL LYMPHATICS AND RARE ATYPICAL EPITHELIOID CELLS, SEE COMMENT. 2. Colon, biopsy, ascending - FRAGMENTS OF TUBULAR ADENOMA (X1);  NEGATIVE FOR HIGH GRADE DYSPLASIA OR MALIGNANCY. 2. Cellulitis of left lower leg 3. Diabetes mellitus 4. Hx of AKI (acute kidney injury) 5.  Hypothyroidism 6. UTI (lower urinary tract infection) 7. Hx of Asthma    Plan:  Surgery later today.    LOS: 4 days    , 02/03/2014

## 2014-02-04 ENCOUNTER — Encounter (HOSPITAL_COMMUNITY): Payer: Self-pay | Admitting: General Surgery

## 2014-02-04 LAB — BASIC METABOLIC PANEL
ANION GAP: 8 (ref 5–15)
BUN: 4 mg/dL — AB (ref 6–23)
CHLORIDE: 101 meq/L (ref 96–112)
CO2: 28 meq/L (ref 19–32)
Calcium: 8.4 mg/dL (ref 8.4–10.5)
Creatinine, Ser: 0.55 mg/dL (ref 0.50–1.10)
GFR calc non Af Amer: >90 mL/min (ref >90)
Glucose, Bld: 141 mg/dL — ABNORMAL HIGH (ref 70–99)
Potassium: 4.2 mEq/L (ref 3.7–5.3)
Sodium: 137 mEq/L (ref 137–147)

## 2014-02-04 LAB — GLUCOSE, CAPILLARY
GLUCOSE-CAPILLARY: 152 mg/dL — AB (ref 70–99)
GLUCOSE-CAPILLARY: 137 mg/dL — AB (ref 70–99)
GLUCOSE-CAPILLARY: 128 mg/dL — AB (ref 70–99)
GLUCOSE-CAPILLARY: 121 mg/dL — AB (ref 70–99)
Glucose-Capillary: 128 mg/dL — ABNORMAL HIGH (ref 70–99)
Glucose-Capillary: 129 mg/dL — ABNORMAL HIGH (ref 70–99)

## 2014-02-04 LAB — CBC
HCT: 32.3 % — ABNORMAL LOW (ref 36.0–46.0)
HEMOGLOBIN: 10.3 g/dL — AB (ref 12.0–15.0)
MCH: 28.6 pg (ref 26.0–34.0)
MCHC: 31.9 g/dL (ref 30.0–36.0)
MCV: 89.7 fL (ref 78.0–100.0)
Platelets: 240 10*3/uL (ref 150–400)
RBC: 3.6 MIL/uL — AB (ref 3.87–5.11)
RDW: 13.2 % (ref 11.5–15.5)
WBC: 9.8 10*3/uL (ref 4.0–10.5)

## 2014-02-04 MED ORDER — ENOXAPARIN SODIUM 40 MG/0.4ML ~~LOC~~ SOLN
40.0000 mg | SUBCUTANEOUS | Status: DC
  Administered 2014-02-04 – 2014-02-07 (×4): 40 mg via SUBCUTANEOUS
  Filled 2014-02-04 (×5): qty 0.4

## 2014-02-04 MED ORDER — ACETAMINOPHEN 10 MG/ML IV SOLN
1000.0000 mg | Freq: Four times a day (QID) | INTRAVENOUS | Status: AC
  Administered 2014-02-04 – 2014-02-05 (×4): 1000 mg via INTRAVENOUS
  Filled 2014-02-04 (×9): qty 100

## 2014-02-04 NOTE — Progress Notes (Signed)
1 Day Post-Op  Subjective: She is real sore, but otherwise looks fine.   Objective: Vital signs in last 24 hours: Temp:  [97.6 F (36.4 C)-98.6 F (37 C)] 97.9 F (36.6 C) (12/16 0600) Pulse Rate:  [82-103] 82 (12/16 0600) Resp:  [9-18] 16 (12/16 0755) BP: (91-129)/(54-75) 91/54 mmHg (12/16 0600) SpO2:  [46 %-100 %] 98 % (12/16 0755) Last BM Date: 02/02/14 I/O 2 liters Positive Afebrile, VSS, BP dow some this Am H/H down some, but labs OK Intake/Output from previous day: 12/15 0701 - 12/16 0700 In: 3680 [I.V.:3600; NG/GT:30; IV Piggyback:50] Out: 1625 [Urine:1425; Emesis/NG output:100; Blood:100] Intake/Output this shift:    General appearance: alert, cooperative and no distress Resp: clear to auscultation bilaterally and anterior GI: sore, BS hypoactive, NG is working, dressing is dry.  Lab Results:   Recent Labs  02/03/14 0400 02/04/14 0434  WBC 8.2 9.8  HGB 12.0 10.3*  HCT 37.5 32.3*  PLT 246 240    BMET  Recent Labs  02/03/14 0400 02/04/14 0434  NA 139 137  K 3.9 4.2  CL 103 101  CO2 25 28  GLUCOSE 155* 141*  BUN 3* 4*  CREATININE 0.59 0.55  CALCIUM 8.9 8.4   PT/INR  Recent Labs  02/03/14 0400  LABPROT 13.7  INR 1.04     Recent Labs Lab 01/30/14 1124 02/03/14 0400  AST 13 11  ALT 13 7  ALKPHOS 136* 118*  BILITOT 0.3 0.2*  PROT 7.0 6.4  ALBUMIN 3.5 2.9*     Lipase     Component Value Date/Time   LIPASE 20 01/30/2014 1124     Studies/Results: No results found.  Medications: . Chlorhexidine Gluconate Cloth  6 each Topical Daily  . enoxaparin (LOVENOX) injection  40 mg Subcutaneous Q24H  . insulin aspart  0-15 Units Subcutaneous 6 times per day  . levothyroxine  12.5 mcg Intravenous Daily  . morphine   Intravenous 6 times per day  . mupirocin ointment  1 application Nasal BID  . pantoprazole (PROTONIX) IV  40 mg Intravenous Q24H    Assessment/Plan 1.  Obstructing tumor ileocecal valve, Laparoscopic-assisted right  colectomy and  resection of distal ileum, 02/03/14, Dr. [name]  2. Hx of diabetes mellitus 3. Cellulitis of left lower leg 4. Hx of AKI (acute kidney injury) 5.  Hypothyroidism 6. UTI (lower urinary tract infection) 7. Hx of Asthma   Plan:  IV tylenol, OOB to chair and ambulate if she feels up to it.  Continue NG suction for now.    LOS: 5 days    Avyukth Bontempo 02/04/2014

## 2014-02-05 LAB — GLUCOSE, CAPILLARY
GLUCOSE-CAPILLARY: 115 mg/dL — AB (ref 70–99)
GLUCOSE-CAPILLARY: 118 mg/dL — AB (ref 70–99)
GLUCOSE-CAPILLARY: 132 mg/dL — AB (ref 70–99)
Glucose-Capillary: 125 mg/dL — ABNORMAL HIGH (ref 70–99)
Glucose-Capillary: 119 mg/dL — ABNORMAL HIGH (ref 70–99)
Glucose-Capillary: 171 mg/dL — ABNORMAL HIGH (ref 70–99)

## 2014-02-05 MED ORDER — ACETAMINOPHEN 10 MG/ML IV SOLN
1000.0000 mg | Freq: Four times a day (QID) | INTRAVENOUS | Status: AC
  Administered 2014-02-05 – 2014-02-06 (×4): 1000 mg via INTRAVENOUS
  Filled 2014-02-05 (×7): qty 100

## 2014-02-05 MED ORDER — MORPHINE SULFATE 2 MG/ML IJ SOLN: 1.0000 mg | INTRAMUSCULAR | Status: DC | PRN

## 2014-02-05 MED ORDER — KCL IN DEXTROSE-NACL 20-5-0.9 MEQ/L-%-% IV SOLN
INTRAVENOUS | Status: DC
  Administered 2014-02-05: 13:00:00 via INTRAVENOUS
  Administered 2014-02-06: 1000 mL via INTRAVENOUS
  Administered 2014-02-06 – 2014-02-07 (×2): via INTRAVENOUS
  Administered 2014-02-07: 75 mL via INTRAVENOUS
  Filled 2014-02-05 (×8): qty 1000

## 2014-02-05 NOTE — Progress Notes (Signed)
2 Days Post-Op  Subjective: She is about the same, no flatus, not much from the NG either.  Course breath sounds, and coughing up some thick sputum.    Objective: Vital signs in last 24 hours: Temp:  [98.2 F (36.8 C)-98.8 F (37.1 C)] 98.4 F (36.9 C) (12/17 1000) Pulse Rate:  [74-94] 94 (12/17 1000) Resp:  [11-18] 17 (12/17 1158) BP: (93-110)/(51-59) 100/59 mmHg (12/17 1000) SpO2:  [95 %-100 %] 98 % (12/17 1158) Last BM Date: 02/02/14 60 PO 350 from the NG; BP is down No labs this Am Intake/Output from previous day: 12/16 0701 - 12/17 0700 In: 3969.3 [P.O.:60; I.V.:3709.3; IV Piggyback:200] Out: 2275 [Urine:1925; Emesis/NG output:350] Intake/Output this shift: Total I/O In: 0  Out: 150 [Urine:150]  General appearance: alert, cooperative and no distress Resp: course breath sounds with allot of thick sputum.   GI: soft sore, few BS, dressings OK  Lab Results:   Recent Labs  02/03/14 0400 02/04/14 0434  WBC 8.2 9.8  HGB 12.0 10.3*  HCT 37.5 32.3*  PLT 246 240    BMET  Recent Labs  02/03/14 0400 02/04/14 0434  NA 139 137  K 3.9 4.2  CL 103 101  CO2 25 28  GLUCOSE 155* 141*  BUN 3* 4*  CREATININE 0.59 0.55  CALCIUM 8.9 8.4   PT/INR  Recent Labs  02/03/14 0400  LABPROT 13.7  INR 1.04     Recent Labs Lab 01/30/14 1124 02/03/14 0400  AST 13 11  ALT 13 7  ALKPHOS 136* 118*  BILITOT 0.3 0.2*  PROT 7.0 6.4  ALBUMIN 3.5 2.9*     Lipase     Component Value Date/Time   LIPASE 20 01/30/2014 1124     Studies/Results: No results found.  Medications: . Chlorhexidine Gluconate Cloth  6 each Topical Daily  . enoxaparin (LOVENOX) injection  40 mg Subcutaneous Q24H  . insulin aspart  0-15 Units Subcutaneous 6 times per day  . levothyroxine  12.5 mcg Intravenous Daily  . morphine   Intravenous 6 times per day  . mupirocin ointment  1 application Nasal BID  . pantoprazole (PROTONIX) IV  40 mg Intravenous Q24H    Assessment/Plan 1.  Obstructing tumor ileocecal valve, Laparoscopic-assisted right colectomy and resection of distal ileum, 02/03/14, Dr. [name]  2. Hx of diabetes mellitus 3. Cellulitis of left lower leg 4. Hx of AKI (acute kidney injury) 5.  Hypothyroidism 6. UTI (lower urinary tract infection) 7. Hx of Asthma 6.  Post op congestion   Plan:  She is not using PCA so we are going to get rid of it.  Try some clamping trials with NG and get her moving more.  Keep her NPO for now. Change IV to NS for lower BP, increase her time OOB, flutter valve, and labs in Am.    LOS: 6 days    Jessica Farrell 02/05/2014

## 2014-02-06 ENCOUNTER — Encounter: Payer: Medicare Other | Admitting: Internal Medicine

## 2014-02-06 LAB — CBC
HCT: 33.2 % — ABNORMAL LOW (ref 36.0–46.0)
Hemoglobin: 10.4 g/dL — ABNORMAL LOW (ref 12.0–15.0)
MCH: 28.7 pg (ref 26.0–34.0)
MCHC: 31.3 g/dL (ref 30.0–36.0)
MCV: 91.5 fL (ref 78.0–100.0)
Platelets: 231 10*3/uL (ref 150–400)
RBC: 3.63 MIL/uL — ABNORMAL LOW (ref 3.87–5.11)
RDW: 13.4 % (ref 11.5–15.5)
WBC: 10 10*3/uL (ref 4.0–10.5)

## 2014-02-06 LAB — BASIC METABOLIC PANEL
Anion gap: 11 (ref 5–15)
BUN: 5 mg/dL — AB (ref 6–23)
CALCIUM: 8.5 mg/dL (ref 8.4–10.5)
CHLORIDE: 102 meq/L (ref 96–112)
CO2: 27 mEq/L (ref 19–32)
CREATININE: 0.61 mg/dL (ref 0.50–1.10)
GFR calc non Af Amer: >90 mL/min (ref >90)
Glucose, Bld: 127 mg/dL — ABNORMAL HIGH (ref 70–99)
Potassium: 4 mEq/L (ref 3.7–5.3)
Sodium: 140 mEq/L (ref 137–147)

## 2014-02-06 LAB — GLUCOSE, CAPILLARY
GLUCOSE-CAPILLARY: 119 mg/dL — AB (ref 70–99)
GLUCOSE-CAPILLARY: 126 mg/dL — AB (ref 70–99)
GLUCOSE-CAPILLARY: 112 mg/dL — AB (ref 70–99)
Glucose-Capillary: 103 mg/dL — ABNORMAL HIGH (ref 70–99)
Glucose-Capillary: 111 mg/dL — ABNORMAL HIGH (ref 70–99)
Glucose-Capillary: 120 mg/dL — ABNORMAL HIGH (ref 70–99)
Glucose-Capillary: 166 mg/dL — ABNORMAL HIGH (ref 70–99)

## 2014-02-06 MED ORDER — ACETAMINOPHEN 325 MG PO TABS
650.0000 mg | ORAL_TABLET | Freq: Once | ORAL | Status: AC
  Administered 2014-02-06: 650 mg via ORAL
  Filled 2014-02-06: qty 2

## 2014-02-06 MED ORDER — ACETAMINOPHEN 325 MG PO TABS: 325.0000 mg | ORAL_TABLET | Freq: Four times a day (QID) | ORAL | Status: DC | PRN

## 2014-02-06 NOTE — Progress Notes (Signed)
3 Days Post-Op  Subjective: She says she hasn't had flatus but she has good bowel sounds and her NG was clamped all day yesterday without nausea.    Objective: Vital signs in last 24 hours: Temp:  [98.2 F (36.8 C)-99.4 F (37.4 C)] 98.7 F (37.1 C) (12/18 0845) Pulse Rate:  [80-103] 93 (12/18 0845) Resp:  [16-17] 16 (12/18 0845) BP: (91-110)/(41-64) 110/64 mmHg (12/18 0845) SpO2:  [87 %-99 %] 96 % (12/18 0845) Last BM Date: 02/02/14 30 from the NG Afebrile, VSS  Labs OK Intake/Output from previous day: 12/17 0701 - 12/18 0700 In: 1500 [I.V.:1270; NG/GT:30; IV Piggyback:200] Out: 2050 [Urine:2050] Intake/Output this shift: Total I/O In: 800 [I.V.:800] Out: 500 [Urine:500]  General appearance: alert, cooperative and no distress Resp: clear to auscultation bilaterally GI: soft sore, + BS, no flatus, incision looks fine  Lab Results:   Recent Labs  02/04/14 0434 02/06/14 0507  WBC 9.8 10.0  HGB 10.3* 10.4*  HCT 32.3* 33.2*  PLT 240 231    BMET  Recent Labs  02/04/14 0434 02/06/14 0507  NA 137 140  K 4.2 4.0  CL 101 102  CO2 28 27  GLUCOSE 141* 127*  BUN 4* 5*  CREATININE 0.55 0.61  CALCIUM 8.4 8.5   PT/INR No results for input(s): LABPROT, INR in the last 72 hours.   Recent Labs Lab 01/30/14 1124 02/03/14 0400  AST 13 11  ALT 13 7  ALKPHOS 136* 118*  BILITOT 0.3 0.2*  PROT 7.0 6.4  ALBUMIN 3.5 2.9*     Lipase     Component Value Date/Time   LIPASE 20 01/30/2014 1124     Studies/Results: No results found.  Medications: . Chlorhexidine Gluconate Cloth  6 each Topical Daily  . enoxaparin (LOVENOX) injection  40 mg Subcutaneous Q24H  . insulin aspart  0-15 Units Subcutaneous 6 times per day  . levothyroxine  12.5 mcg Intravenous Daily  . mupirocin ointment  1 application Nasal BID  . pantoprazole (PROTONIX) IV  40 mg Intravenous Q24H    Assessment/Plan 1. Obstructing tumor ileocecal valve, Laparoscopic-assisted right colectomy and  resection of distal ileum, 02/03/14, Dr. [name]  2. Hx of diabetes mellitus 3. Cellulitis of left lower leg 4. Hx of AKI (acute kidney injury) 5.  Hypothyroidism 6. UTI (lower urinary tract infection) 7. Hx of Asthma 6. Post op congestion   Plan:  Pull the NG sips and ice chips today.  Continue to mobilize. Dry dressing to incision.      LOS: 7 days    Hisham Provence 02/06/2014

## 2014-02-07 LAB — GLUCOSE, CAPILLARY
GLUCOSE-CAPILLARY: 106 mg/dL — AB (ref 70–99)
Glucose-Capillary: 108 mg/dL — ABNORMAL HIGH (ref 70–99)
Glucose-Capillary: 144 mg/dL — ABNORMAL HIGH (ref 70–99)
Glucose-Capillary: 159 mg/dL — ABNORMAL HIGH (ref 70–99)
Glucose-Capillary: 156 mg/dL — ABNORMAL HIGH (ref 70–99)

## 2014-02-07 MED ORDER — OXYCODONE HCL 5 MG PO TABS: 5.0000 mg | ORAL_TABLET | ORAL | Status: DC | PRN

## 2014-02-07 NOTE — Progress Notes (Signed)
4 Days Post-Op  Subjective: Passing gas.  Had a small BM.  Objective: Vital signs in last 24 hours: Temp:  [97.9 F (36.6 C)-98.5 F (36.9 C)] 98.1 F (36.7 C) (12/19 0543) Pulse Rate:  [79-91] 79 (12/19 0543) Resp:  [12-16] 16 (12/19 0543) BP: (101-116)/(57-70) 111/65 mmHg (12/19 0543) SpO2:  [93 %-97 %] 97 % (12/19 0543) Last BM Date:  (pre-op)  Intake/Output from previous day: 12/18 0701 - 12/19 0700 In: 1720 [P.O.:120; I.V.:1600] Out: 2175 [Urine:2175] Intake/Output this shift: Total I/O In: 900 [I.V.:900] Out: 400 [Urine:400]  PE: General- In NAD Abdomen-soft, incisions are clean and intact  Lab Results:   Recent Labs  02/06/14 0507  WBC 10.0  HGB 10.4*  HCT 33.2*  PLT 231   BMET  Recent Labs  02/06/14 0507  NA 140  K 4.0  CL 102  CO2 27  GLUCOSE 127*  BUN 5*  CREATININE 0.61  CALCIUM 8.5   PT/INR No results for input(s): LABPROT, INR in the last 72 hours. Comprehensive Metabolic Panel:    Component Value Date/Time   NA 140 02/06/2014 0507   NA 137 02/04/2014 0434   K 4.0 02/06/2014 0507   K 4.2 02/04/2014 0434   CL 102 02/06/2014 0507   CL 101 02/04/2014 0434   CO2 27 02/06/2014 0507   CO2 28 02/04/2014 0434   BUN 5* 02/06/2014 0507   BUN 4* 02/04/2014 0434   CREATININE 0.61 02/06/2014 0507   CREATININE 0.55 02/04/2014 0434   GLUCOSE 127* 02/06/2014 0507   GLUCOSE 141* 02/04/2014 0434   CALCIUM 8.5 02/06/2014 0507   CALCIUM 8.4 02/04/2014 0434   AST 11 02/03/2014 0400   AST 13 01/30/2014 1124   ALT 7 02/03/2014 0400   ALT 13 01/30/2014 1124   ALKPHOS 118* 02/03/2014 0400   ALKPHOS 136* 01/30/2014 1124   BILITOT 0.2* 02/03/2014 0400   BILITOT 0.3 01/30/2014 1124   PROT 6.4 02/03/2014 0400   PROT 7.0 01/30/2014 1124   ALBUMIN 2.9* 02/03/2014 0400   ALBUMIN 3.5 01/30/2014 1124     Studies/Results: No results found.  Anti-infectives: Anti-infectives    Start     Dose/Rate Route Frequency Ordered Stop   02/03/14 1045   cefoTEtan (CEFOTAN) 2 g in dextrose 5 % 50 mL IVPB     2 g100 mL/hr over 30 Minutes Intravenous  Once 02/03/14 1033 02/03/14 1058   02/02/14 1200  cefoTEtan (CEFOTAN) 2 g in dextrose 5 % 50 mL IVPB  Status:  Discontinued     2 g100 mL/hr over 30 Minutes Intravenous On call to O.R. 02/02/14 1146 02/03/14 1442   01/31/14 1500  cefTRIAXone (ROCEPHIN) 1 g in dextrose 5 % 50 mL IVPB - Premix     1 g100 mL/hr over 30 Minutes Intravenous Every 24 hours 01/30/14 1743 02/01/14 1512   01/30/14 1445  cefTRIAXone (ROCEPHIN) 1 g in dextrose 5 % 50 mL IVPB     1 g100 mL/hr over 30 Minutes Intravenous  Once 01/30/14 1433 01/30/14 1535      Assessment Obstructing malignant carcinoid tumor s/p right colectomy and resection of distal ileum-bowel function has returned.   LOS: 8 days   Plan:  Full liquid diet.  Oral analgesic.  Outpatient Oncology consult.   Perl Kerney J 02/07/2014

## 2014-02-08 LAB — GLUCOSE, CAPILLARY
GLUCOSE-CAPILLARY: 111 mg/dL — AB (ref 70–99)
Glucose-Capillary: 120 mg/dL — ABNORMAL HIGH (ref 70–99)
Glucose-Capillary: 152 mg/dL — ABNORMAL HIGH (ref 70–99)
Glucose-Capillary: 95 mg/dL (ref 70–99)

## 2014-02-08 MED ORDER — HYDROCODONE-ACETAMINOPHEN 5-325 MG PO TABS: 1.0000 | ORAL_TABLET | ORAL | Status: DC | PRN

## 2014-02-08 NOTE — Discharge Instructions (Signed)
[facility], [place] [phone]  OPEN ABDOMINAL SURGERY: POST OP INSTRUCTIONS  Always review your discharge instruction sheet given to you by the facility where your surgery was performed.  IF YOU HAVE DISABILITY OR FAMILY LEAVE FORMS, YOU MUST BRING THEM TO THE OFFICE FOR PROCESSING.  PLEASE DO NOT GIVE THEM TO YOUR DOCTOR.  1. A prescription for pain medication may be given to you upon discharge.  Take your pain medication as prescribed, if needed.  If narcotic pain medicine is not needed, then you may take acetaminophen (Tylenol) or ibuprofen (Advil) as needed. 2. Take your usually prescribed medications unless otherwise directed. 3. If you need a refill on your pain medication, please contact your pharmacy. They will contact our office to request authorization.  Prescriptions will not be filled after 5pm or on week-ends. 4. You should follow a lowfat diet.  Be sure to include lots of fluids daily. 5. Most patients will experience some swelling and bruising in the area of the incision. Ice pack will help. Swelling and bruising can take several days to resolve..  6. It is common to experience some constipation if taking pain medication after surgery.  Increasing fluid intake and taking a stool softener will usually help or prevent this problem from occurring.  A mild laxative (Milk of Magnesia or Miralax) should be taken according to package directions if there are no bowel movements after 48 hours. 7.  You may have steri-strips (small skin tapes) in place directly over the incision.  These strips should be left on the skin for 7-10 days.  If your surgeon used skin glue on the incision, you may shower in 24 hours.  The glue will flake off over the next 2-3 weeks.  Any sutures or staples will be removed at the office during your follow-up visit. You may find that a light gauze bandage over your incision may keep your staples from being rubbed or pulled. You may shower and replace  the bandage daily. 8. ACTIVITIES:  You may resume light daily activities--such as daily self-care, walking, climbing stairs--gradually increasing activities as tolerated.  You may have sexual intercourse when it is comfortable.  Refrain from any heavy lifting or straining-no lifting over 10 pounds- until approved by your doctor. a. You may drive when you no longer are taking prescription pain medication, you can comfortably wear a seatbelt, and you can safely maneuver your car and apply brakes b. Return to Work:   When released to do so by MD___________________________________ 41. You should see your doctor in the office for a follow-up appointment approximately two weeks after your surgery.  Make sure that you call for this appointment within a day or two after you arrive home to insure a convenient appointment time. OTHER INSTRUCTIONS:  _Office will call you to schedule staple removal.____________________________________________________________ _____________________________________________________________  WHEN TO CALL YOUR DOCTOR: 1. Fever over 101.0 2. Inability to urinate 3. Nausea and/or vomiting 4. Extreme swelling or bruising 5. Continued bleeding from incision. 6. Increased pain, redness, or drainage from the incision. 7. Difficulty swallowing or breathing 8. Muscle cramping or spasms. 9. Numbness or tingling in hands or feet or around lips.  The clinic staff is available to answer your questions during regular business hours.  Please dont hesitate to call and ask to speak to one of the nurses if you have concerns.  For further questions, please visit [email or web address]

## 2014-02-08 NOTE — Progress Notes (Signed)
5 Days Post-Op  Subjective: Tolerating full liquid diet.  Bowels moving.  Objective: Vital signs in last 24 hours: Temp:  [98.2 F (36.8 C)-99 F (37.2 C)] 98.3 F (36.8 C) (12/19 2050) Pulse Rate:  [78-90] 83 (12/19 2050) Resp:  [16-18] 18 (12/19 2050) BP: (109-131)/(64-83) 131/65 mmHg (12/19 2050) SpO2:  [94 %-98 %] 96 % (12/19 2050) Last BM Date:  (pre-op)  Intake/Output from previous day: 12/19 0701 - 12/20 0700 In: 2862.9 [P.O.:240; I.V.:2622.9] Out: 1000 [Urine:1000] Intake/Output this shift:    PE: General- In NAD Abdomen-soft, incisions are clean and intact  Lab Results:   Recent Labs  02/06/14 0507  WBC 10.0  HGB 10.4*  HCT 33.2*  PLT 231   BMET  Recent Labs  02/06/14 0507  NA 140  K 4.0  CL 102  CO2 27  GLUCOSE 127*  BUN 5*  CREATININE 0.61  CALCIUM 8.5   PT/INR No results for input(s): LABPROT, INR in the last 72 hours. Comprehensive Metabolic Panel:    Component Value Date/Time   NA 140 02/06/2014 0507   NA 137 02/04/2014 0434   K 4.0 02/06/2014 0507   K 4.2 02/04/2014 0434   CL 102 02/06/2014 0507   CL 101 02/04/2014 0434   CO2 27 02/06/2014 0507   CO2 28 02/04/2014 0434   BUN 5* 02/06/2014 0507   BUN 4* 02/04/2014 0434   CREATININE 0.61 02/06/2014 0507   CREATININE 0.55 02/04/2014 0434   GLUCOSE 127* 02/06/2014 0507   GLUCOSE 141* 02/04/2014 0434   CALCIUM 8.5 02/06/2014 0507   CALCIUM 8.4 02/04/2014 0434   AST 11 02/03/2014 0400   AST 13 01/30/2014 1124   ALT 7 02/03/2014 0400   ALT 13 01/30/2014 1124   ALKPHOS 118* 02/03/2014 0400   ALKPHOS 136* 01/30/2014 1124   BILITOT 0.2* 02/03/2014 0400   BILITOT 0.3 01/30/2014 1124   PROT 6.4 02/03/2014 0400   PROT 7.0 01/30/2014 1124   ALBUMIN 2.9* 02/03/2014 0400   ALBUMIN 3.5 01/30/2014 1124     Studies/Results: No results found.  Anti-infectives: Anti-infectives    Start     Dose/Rate Route Frequency Ordered Stop   02/03/14 1045  cefoTEtan (CEFOTAN) 2 g in dextrose  5 % 50 mL IVPB     2 g100 mL/hr over 30 Minutes Intravenous  Once 02/03/14 1033 02/03/14 1058   02/02/14 1200  cefoTEtan (CEFOTAN) 2 g in dextrose 5 % 50 mL IVPB  Status:  Discontinued     2 g100 mL/hr over 30 Minutes Intravenous On call to O.R. 02/02/14 1146 02/03/14 1442   01/31/14 1500  cefTRIAXone (ROCEPHIN) 1 g in dextrose 5 % 50 mL IVPB - Premix     1 g100 mL/hr over 30 Minutes Intravenous Every 24 hours 01/30/14 1743 02/01/14 1512   01/30/14 1445  cefTRIAXone (ROCEPHIN) 1 g in dextrose 5 % 50 mL IVPB     1 g100 mL/hr over 30 Minutes Intravenous  Once 01/30/14 1433 01/30/14 1535      Assessment Obstructing malignant carcinoid tumor s/p right colectomy and resection of distal ileum-progressing well.   LOS: 9 days   Plan:  Advance diet.  If tolerated could be discharged later today.  Discharge instructions discussed with her.   Blessed Cotham J 02/08/2014

## 2014-02-09 ENCOUNTER — Other Ambulatory Visit (INDEPENDENT_AMBULATORY_CARE_PROVIDER_SITE_OTHER): Payer: Self-pay | Admitting: General Surgery

## 2014-02-10 ENCOUNTER — Telehealth: Payer: Self-pay | Admitting: Hematology

## 2014-02-10 NOTE — Telephone Encounter (Signed)
S/W PATIENT AND GAVE NP APPT FOR 1/11 @ 1:30 W/DR. SHERRILL

## 2014-02-11 ENCOUNTER — Telehealth: Payer: Self-pay | Admitting: Hematology

## 2014-02-11 NOTE — Telephone Encounter (Signed)
Delivered chart 02/11/14 °

## 2014-02-16 ENCOUNTER — Other Ambulatory Visit (INDEPENDENT_AMBULATORY_CARE_PROVIDER_SITE_OTHER): Payer: Self-pay | Admitting: General Surgery

## 2014-02-16 ENCOUNTER — Other Ambulatory Visit (INDEPENDENT_AMBULATORY_CARE_PROVIDER_SITE_OTHER): Payer: Self-pay

## 2014-02-16 DIAGNOSIS — K529 Noninfective gastroenteritis and colitis, unspecified: Secondary | ICD-10-CM

## 2014-02-18 DIAGNOSIS — C7B8 Other secondary neuroendocrine tumors: Secondary | ICD-10-CM

## 2014-02-18 NOTE — Discharge Summary (Signed)
Physician Discharge Summary  Patient ID: Jessica Farrell MRN: [id number] DOB/AGE: November 14, 1945 68 y.o.  Admit date: 01/30/2014 Discharge date: 02/08/2014  Admission Diagnoses:  1. Obstructing tumor ileocecal valve, Laparoscopic-assisted right colectomy and resection of distal ileum, 02/03/14, Dr. [name]  2. Hx of diabetes mellitus 3. Cellulitis of left lower leg 4. Hx of AKI (acute kidney injury) 5.  Hypothyroidism 6. UTI (lower urinary tract infection) 7. Hx of Asthma 6. Post op congestion Discharge Diagnoses:  1.  Neuroendocrine tumor of the small bowel, TNM Staging: pT3, pN1 2. Hx of diabetes mellitus 3. Cellulitis of left lower leg 4. Hx of AKI (acute kidney injury) 5.  Hypothyroidism 6. UTI (lower urinary tract infection) 7. Hx of Asthma 6. Post op congestion  Principal Problem:   Malignant poorly differentiated neuroendocrine tumors Active Problems:   Diabetes mellitus   Hypothyroidism   UTI (lower urinary tract infection)   SBO (small bowel obstruction)   Asthma   PROCEDURES:  Laparoscopic-assisted right colectomy and resection of distal ileum, 02/03/14, Dr. Jackolyn Confer.  PATHOLOGY:  Diagnosis Small intestine, resection for tumor, ascending - INVASIVE WELL DIFFERENTIATED NEUROENDOCRINE TUMOR (CARCINOID), SPANNING 3 CM IN GREATEST DIMENSION. - TUMOR INVADES MUSCULARIS PROPRIA TO INVOLVE SUBSEROSAL SOFT TISSUES. - MARGINS ARE NEGATIVE. - FIVE OF TWENTY-FIVE LYMPH NODES ARE POSITIVE FOR METASTATIC LOW GRADE NEUROENDOCRINE TUMOR (5/25). - EXTRACAPSULAR EXTENSION IS IDENTIFIED. - SEE ONCOLOGY TEMPLATE.  Hospital Course: This is a 68 yo white female who has never had a colonoscopy before began having some RLQ abdominal pain about 2 months ago. She had N/V and thought she had food poisoning. It resolved and then came back again several weeks later. She saw her PCP who sent her to GI for further evaluation. Here she had a CT  scan which revealed a mass in the RLQ. A coloscopy was then set up. This was done on Wednesday of this week. Biopsies were obtained of a soft sponge-like mass at the ICV . This was felt to be suspicious for a neuroendocrine tumor. Biopsies are still pending. Yesterday, she continued to have worsening pain in her RLQ with profuse N/V. She called the GI office and she was referred here. A CT scan reveals a high grade SBO secondary to this mass as the ICV. It has inflammatory changed and wall thickening of the surrounding small bowel. Due to SBO, we have been asked to see the patient for admission.  She was admitted by Dr. Harlow Asa, NG was placed and she was kept on bowel rest.  She also had a UTI and was treated for this.  Initial biopsy was negative for tumor.  She was taken to the OR on 12/15 and underwent procedure noted above.   She made slow progress, NG was removed after 3 days and she was started on sips of clears.  Her diet was advanced, and she was ready for discharge on 02/08/14.  Pathology from surgical specimen showed, neuroendocrine tumor as noted above.  Prior to Admission medications   Medication Sig Start Date End Date Taking? Authorizing Provider  Albuterol Sulfate 108 (90 BASE) MCG/ACT AEPB Inhale 2 puffs into the lungs every 4 (four) hours as needed (wheezing/SOB).    Yes Historical Provider, MD  atorvastatin (LIPITOR) 10 MG tablet Take 10 mg by mouth daily.   Yes Historical Provider, MD  levothyroxine (SYNTHROID, LEVOTHROID) 25 MCG tablet Take 1 tablet (25 mcg total) by mouth daily before breakfast. 05/09/12  Yes Jonetta Osgood, MD  metFORMIN (GLUCOPHAGE) 500 MG  tablet Take 1 tablet (500 mg total) by mouth 2 (two) times daily with a meal. 05/09/12  Yes Shanker Kristeen Mans, MD  ondansetron (ZOFRAN) 4 MG tablet Take 1 tab every 6 hours as needed for nausea. 01/22/14  Yes Amy S Esterwood, PA-C  traMADol (ULTRAM) 50 MG tablet Take 1 tablet (50 mg total) by mouth every 6 (six) hours as  needed. 01/22/14  Yes Amy S Esterwood, PA-C  HYDROcodone-acetaminophen (NORCO/VICODIN) 5-325 MG per tablet Take 1-2 tablets by mouth every 4 (four) hours as needed for moderate pain or severe pain. 02/08/14   Jackolyn Confer, MD    Follow up in our office is not listed, I have sent a message to clinical staff to arrange follow up with Dr. Zella Richer.  Condition on d/c:  Improved       Disposition: 01-Home or Self Care     Medication List    STOP taking these medications        dicyclomine 10 MG capsule  Commonly known as:  BENTYL      TAKE these medications        Albuterol Sulfate 108 (90 BASE) MCG/ACT Aepb  Inhale 2 puffs into the lungs every 4 (four) hours as needed (wheezing/SOB).     atorvastatin 10 MG tablet  Commonly known as:  LIPITOR  Take 10 mg by mouth daily.     HYDROcodone-acetaminophen 5-325 MG per tablet  Commonly known as:  NORCO/VICODIN  Take 1-2 tablets by mouth every 4 (four) hours as needed for moderate pain or severe pain.     levothyroxine 25 MCG tablet  Commonly known as:  SYNTHROID, LEVOTHROID  Take 1 tablet (25 mcg total) by mouth daily before breakfast.     metFORMIN 500 MG tablet  Commonly known as:  GLUCOPHAGE  Take 1 tablet (500 mg total) by mouth 2 (two) times daily with a meal.     ondansetron 4 MG tablet  Commonly known as:  ZOFRAN  Take 1 tab every 6 hours as needed for nausea.     traMADol 50 MG tablet  Commonly known as:  ULTRAM  Take 1 tablet (50 mg total) by mouth every 6 (six) hours as needed.         SignedEarnstine Regal 02/18/2014, 3:45 PM

## 2014-02-23 LAB — CLOSTRIDIUM DIFFICILE CULTURE-FECAL

## 2014-03-01 NOTE — Progress Notes (Signed)
[place]  [phone] Fax:([phone]) [place] Note   Patient Care Team: Haywood Pao, MD as PCP - General (Internal Medicine) Truitt Merle, MD as Consulting Physician (Hematology) Jackolyn Confer, MD as Consulting Physician (General Surgery) 03/02/2014  CHIEF COMPLAINTS/PURPOSE OF CONSULTATION:  Carcinoid tumor    Oncology History   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   Staging form: Colon and Rectum, AJCC 7th Edition     Clinical: T3, N2, M0 - Unsigned       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   01/21/2014 Imaging CT: Distal small bowel obstruction due to 3.4 cm soft tissue mass near the ileocecal valve, with adjacent right lower quadrant mesenteric lymphadenopathy   02/03/2014 Pathologic Stage invasive well diff neuroendocrine tumor (carcinoid), 3cm, pT3pN2 (5/25 nodes positive). negative margines.   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     HISTORY OF PRESENTING ILLNESS: Jessica Farrell is very nice 69 year old white female who was referred by GI Dr. Zella Richer for newly diagnosed small intestine carcinoid tumor.   She presented with 5-6 week history of mid and generalized abdominal discomfort with episodes of sharp intense abdominal pain, intermittent nausea and vomiting and diarrhea with loose stools generally postprandially. She was referred to GI Dr. Lillia Dallas who ordered a CT of the abdomen which was done on 01/21/2014. It shows a 2.6 x 3.4 cm soft tissue mass at the ileocecal valve there is mild adjacent adenopathy. There is also moderate dilation of the distal small bowel loops. Unclear whether this represents an adenocarcinoma or possibly a carcinoid. She underwent Colonoscopy 01/28/2014 and a circumferential mass was found  within the ileocecal valve and partially obstructing the lumen ,multiple biopsies were performed which came back negative for malignancy.   She presented to ED with abdominal pain, nausea and vomiting on 02/03/2014 and was admitted for SBO, and underwent Laparoscopic-assisted right colectomy and resection of distal ileum on 02/03/2014 by Dr. Zella Richer .   She has recovered well after surgery. The incision related pain is near resolved, still has small discharge form the ssurgical wound. She eats well, she has been having diarrhea since the surgery, improved, 3 loose BM daily now. She lost about 50 lbs in the past 2 years, which she contributes mainly to her diet control for DM.   MEDICAL HISTORY:  Past Medical History  Diagnosis Date  . Asthma   . Hyperlipidemia   . Diabetes mellitus 05/05/2012    Type II  . Thyroid disease 1990's    HypoThyroidism  . Cellulitis March  2014    Left foot and ankle  . Morbid obesity     SURGICAL HISTORY: Past Surgical History  Procedure Laterality Date  . Fracture surgery  2000    Ankle  . Tooth extraction      wisdom Teeth  . Colon resection N/A 02/03/2014    Procedure: LAPAROSCOPIC ASSISTED BOWEL RESECTION;  Surgeon: Jackolyn Confer, MD;  Location: [facility];  Service: General;  Laterality: N/A;    SOCIAL HISTORY: History   Social History  . Marital Status: Divorced    Spouse Name: N/A    Number of Children: 2  . Years of Education: N/A   Occupational History  . Secretary, still work part time    Social History Main Topics  . Smoking status: Never Smoker   . Smokeless  tobacco: Never Used  . Alcohol Use: No  . Drug Use: No  . Sexual Activity: Not Currently    Birth Control/ Protection: Post-menopausal   Other Topics Concern  . Not on file   Social History Narrative    FAMILY HISTORY: Family History  Problem Relation Age of Onset  . Alzheimer's disease Father   . Heart disease Mother   . Arthritis Mother   . Hypertension  Mother   . Alzheimer's disease Sister   . Cancer Sister 57    breast cancer   . Heart disease Brother     Heart Disease before age 68  . Colon cancer Neg Hx   . Esophageal cancer Neg Hx   . Rectal cancer Neg Hx   . Stomach cancer Neg Hx     ALLERGIES:  is allergic to penicillins.  MEDICATIONS:  Current Outpatient Prescriptions  Medication Sig Dispense Refill  . Albuterol Sulfate 108 (90 BASE) MCG/ACT AEPB Inhale 2 puffs into the lungs every 4 (four) hours as needed (wheezing/SOB).     Marland Kitchen atorvastatin (LIPITOR) 10 MG tablet Take 10 mg by mouth daily.    Marland Kitchen levothyroxine (SYNTHROID, LEVOTHROID) 25 MCG tablet Take 1 tablet (25 mcg total) by mouth daily before breakfast. 30 tablet 0  . metFORMIN (GLUCOPHAGE) 500 MG tablet Take 1 tablet (500 mg total) by mouth 2 (two) times daily with a meal. 60 tablet 0  . HYDROcodone-acetaminophen (NORCO/VICODIN) 5-325 MG per tablet Take 1-2 tablets by mouth every 4 (four) hours as needed for moderate pain or severe pain. (Patient not taking: Reported on 03/02/2014) 40 tablet 0  . ondansetron (ZOFRAN) 4 MG tablet Take 1 tab every 6 hours as needed for nausea. (Patient not taking: Reported on 03/02/2014) 30 tablet 0  . traMADol (ULTRAM) 50 MG tablet Take 1 tablet (50 mg total) by mouth every 6 (six) hours as needed. (Patient not taking: Reported on 03/02/2014) 40 tablet 0   No current facility-administered medications for this visit.    REVIEW OF SYSTEMS:   Constitutional: Denies fevers, chills or abnormal night sweats Eyes: Denies blurriness of vision, double vision or watery eyes Ears, nose, mouth, throat, and face: Denies mucositis or sore throat Respiratory: Denies cough, dyspnea or wheezes Cardiovascular: Denies palpitation, chest discomfort or lower extremity swelling Gastrointestinal:  Denies nausea, heartburn or change in bowel habits Skin: Denies abnormal skin rashes Lymphatics: Denies new lymphadenopathy or easy bruising Neurological:Denies  numbness, tingling or new weaknesses Behavioral/Psych: Mood is stable, no new changes  All other systems were reviewed with the patient and are negative.  PHYSICAL EXAMINATION: ECOG PERFORMANCE STATUS: 0 - Asymptomatic  Filed Vitals:   03/02/14 1404  BP: 127/69  Pulse: 96  Temp: 98.2 F (36.8 C)  Resp: 20   Filed Weights   03/02/14 1404  Weight: 155 lb 6.4 oz (70.489 kg)    GENERAL:alert, no distress and comfortable SKIN: skin color, texture, turgor are normal, no rashes or significant lesions EYES: normal, conjunctiva are pink and non-injected, sclera clear OROPHARYNX:no exudate, no erythema and lips, buccal mucosa, and tongue normal  NECK: supple, thyroid normal size, non-tender, without nodularity LYMPH:  no palpable lymphadenopathy in the cervical, axillary or inguinal LUNGS: clear to auscultation and percussion with normal breathing effort HEART: regular rate & rhythm and no murmurs and no lower extremity edema ABDOMEN:abdomen soft, non-tender and normal bowel sounds Musculoskeletal:no cyanosis of digits and no clubbing  PSYCH: alert & oriented x 3 with fluent speech NEURO: no focal motor/sensory  deficits  LABORATORY DATA:  I have reviewed the data as listed Lab Results  Component Value Date   WBC 7.3 03/02/2014   HGB 11.5* 03/02/2014   HCT 35.6 03/02/2014   MCV 89.7 03/02/2014   PLT 238 03/02/2014    Recent Labs  01/30/14 1124  02/03/14 0400 02/04/14 0434 02/06/14 0507 03/02/14 1547  NA 143  < > 139 137 140 142  K 4.6  < > 3.9 4.2 4.0 3.6  CL 104  < > 103 101 102  --   CO2 24  < > 25 28 27 26   GLUCOSE 118*  < > 155* 141* 127* 139  BUN 15  < > 3* 4* 5* 13.8  CREATININE 0.71  < > 0.59 0.55 0.61 0.8  CALCIUM 9.4  < > 8.9 8.4 8.5 8.9  GFRNONAA 87*  < > >90 >90 >90  --   GFRAA >90  < > >90 >90 >90  --   PROT 7.0  --  6.4  --   --  6.5  ALBUMIN 3.5  --  2.9*  --   --  3.3*  AST 13  --  11  --   --  14  ALT 13  --  7  --   --  14  ALKPHOS 136*  --  118*   --   --  123  BILITOT 0.3  --  0.2*  --   --  0.23  < > = values in this interval not displayed.  PATHOLOGY REPORT  Small intestine, resection for tumor, ascending 02/03/2014 - INVASIVE WELL DIFFERENTIATED NEUROENDOCRINE TUMOR (CARCINOID), SPANNING 3 CM IN GREATEST DIMENSION. - TUMOR INVADES MUSCULARIS PROPRIA TO INVOLVE SUBSEROSAL SOFT TISSUES. - MARGINS ARE NEGATIVE. - FIVE OF TWENTY-FIVE LYMPH NODES ARE POSITIVE FOR METASTATIC LOW GRADE NEUROENDOCRINE TUMOR (5/25). - EXTRACAPSULAR EXTENSION IS IDENTIFIED. - SEE ONCOLOGY TEMPLATE. Specimen: Portion of terminal ileum with attached proximal portion of right colon. Procedure: Laparoscopic assisted right colectomy and resection of distal ileum. Tumor Site: Distal terminal ileum, 3 cm proximal to ileocecal valve. Tumor Size: 3 cm in greatest dimension. Tumor Focality: Unifocal. Histologic Type: Well differentiated neuroendocrine tumor (carcinoid tumor) . Histologic Grade : G1: Low grade. Mitotic Rate low mitotic rate, less than 1/10 mitoses per hpf (high power field). Microscopic Tumor Extension: Tumor invades through muscularis propria to involve subserosal soft tissues, Margins: Proximal Margin: Negative. Distal Margin: Negative. Mesenteric (Radial) Margin : Negative. All margins uninvolved by neuroendocrine tumor: Distance from closest margin: 4.5 cm Margin: Mesenteric margin. Lymph-Vascular Invasion: Tumor is seen involving a subcapsular sinus space (lymphatic space) in one of the lymph nodes. Perineural Invasion: Not identified. Lymph nodes: number examined - 25; number positive: 5. TNM Staging: pT3, pN1.   RADIOGRAPHIC STUDIES: I have personally reviewed the radiological images as listed and agreed with the findings in the report.  CT abdomen and pelvis 01/21/2014 IMPRESSION: Distal small bowel obstruction due to 3.4 cm soft tissue mass near the ileocecal valve, with adjacent right lower quadrant  mesenteric lymphadenopathy. Differential diagnosis includes adenocarcinoma and carcinoid tumor.  No evidence of distant metastatic disease.  Sigmoid diverticulosis, without evidence of diverticulitis.  CT abdomen and pelvis 01/30/2014 IMPRESSION: 1. Persistent moderately high-grade small bowel obstruction related to the terminal ileum mass. Interval increased inflammation of the dilated loops in the right lower quadrant, now with wall thickening and free fluid within the leaves of the mesentery. Small volume free fluid in the right gutter, and adjacent to the liver  and spleen. 2. No free air. No extraluminal air identified. 3. No new abnormality in the abdomen or pelvis.  ASSESSMENT & PLAN:  69 year old female with newly diagnosed carcinoid tumor at the terminal ileum near the ileumcecal valve.  1. 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 Surgical neck margins were negative. -I discussed her diagnosis and the staging with her in great details.  -She has no role of adjuvant chemotherapy for resected carcinoid tumor.  -We discussed the risks of tumor recurrence in the future, including late recurrence. -She did have some carcinoid syndrome symptoms before the surgery, I'll check her chromogranin A and 24 hour urine 5 HIAA levels. Those were not checked before surgery. -I recommend a total of 10 years surveillance, with office visit, lab 4 every 3-6 months for the first 2 years, then every 6-12 months afterwards. And use CT scans can be considered for surveillance, per [place] and guideline.  2. Anemia -She had a mild anemias before the surgery, which could related to tumor bleeding. -I'll check her I'll level, and recommend iron supplement if evidence of iron deficiency. -Follow-up her CBC.  Plan: Return to clinic in 3 months with a baseline CT chest, abdomen and pelvis with  IV contrast. Lab today and on next visit.  Orders Placed This Encounter  Procedures  . CT Abdomen Pelvis W Contrast    Standing Status: Future     Number of Occurrences:      Standing Expiration Date: 03/02/2015    Order Specific Question:  Reason for Exam (SYMPTOM  OR DIAGNOSIS REQUIRED)    Answer:  small bowel carcinoid tumor, s/p resection, survelliance    Order Specific Question:  Preferred imaging location?    Answer:  [facility]  . CT Chest W Contrast    Standing Status: Future     Number of Occurrences:      Standing Expiration Date: 03/02/2015    Order Specific Question:  Reason for Exam (SYMPTOM  OR DIAGNOSIS REQUIRED)    Answer:  small bowel carcinoid tumor, s/p resection, survelliance    Order Specific Question:  Preferred imaging location?    Answer:  [facility]  . CBC & Diff and Retic    Standing Status: Standing     Number of Occurrences: 10     Standing Expiration Date: 03/02/2016  . Comprehensive metabolic panel (Cmet) - [facility]    Standing Status: Standing     Number of Occurrences: 10     Standing Expiration Date: 03/02/2016  . Chromogranin A    Standing Status: Standing     Number of Occurrences: 10     Standing Expiration Date: 03/02/2016  . Iron and TIBC [facility]    Standing Status: Future     Number of Occurrences: 1     Standing Expiration Date: 03/03/2015  . Ferritin    Standing Status: Future     Number of Occurrences: 1     Standing Expiration Date: 03/03/2015  . 5 HIAA, quantitative, urine, 24 hour    Standing Status: Future     Number of Occurrences: 1     Standing Expiration Date: 03/03/2015    All questions were answered. The patient knows to call the clinic with any problems, questions or concerns. I spent 40 minutes counseling the patient face to face. The total time spent in the appointment was 55 minutes and more than 50% was on  counseling.     Truitt Merle, MD 03/02/2014 5:41 PM

## 2014-03-02 ENCOUNTER — Ambulatory Visit (HOSPITAL_BASED_OUTPATIENT_CLINIC_OR_DEPARTMENT_OTHER): Payer: Medicare Other

## 2014-03-02 ENCOUNTER — Encounter: Payer: Self-pay | Admitting: Hematology

## 2014-03-02 ENCOUNTER — Ambulatory Visit (HOSPITAL_BASED_OUTPATIENT_CLINIC_OR_DEPARTMENT_OTHER): Payer: Medicare Other | Admitting: Hematology

## 2014-03-02 ENCOUNTER — Ambulatory Visit: Payer: Medicare Other

## 2014-03-02 ENCOUNTER — Telehealth: Payer: Self-pay | Admitting: Hematology

## 2014-03-02 VITALS — BP 127/69 | HR 96 | Temp 98.2°F | Resp 20 | Ht 65.0 in | Wt 155.4 lb

## 2014-03-02 DIAGNOSIS — C7A012 Malignant carcinoid tumor of the ileum: Secondary | ICD-10-CM | POA: Diagnosis not present

## 2014-03-02 DIAGNOSIS — C7A8 Other malignant neuroendocrine tumors: Secondary | ICD-10-CM

## 2014-03-02 DIAGNOSIS — C7B8 Other secondary neuroendocrine tumors: Secondary | ICD-10-CM

## 2014-03-02 DIAGNOSIS — C7A00 Malignant carcinoid tumor of unspecified site: Secondary | ICD-10-CM | POA: Diagnosis not present

## 2014-03-02 DIAGNOSIS — D649 Anemia, unspecified: Secondary | ICD-10-CM

## 2014-03-02 LAB — COMPREHENSIVE METABOLIC PANEL (CC13)
ALBUMIN: 3.3 g/dL — AB (ref 3.5–5.0)
ALT: 14 U/L (ref 0–55)
ANION GAP: 8 meq/L (ref 3–11)
AST: 14 U/L (ref 5–34)
Alkaline Phosphatase: 123 U/L (ref 40–150)
BUN: 13.8 mg/dL (ref 7.0–26.0)
CHLORIDE: 109 meq/L (ref 98–109)
CO2: 26 meq/L (ref 22–29)
CREATININE: 0.8 mg/dL (ref 0.6–1.1)
Calcium: 8.9 mg/dL (ref 8.4–10.4)
EGFR: 80 mL/min/{1.73_m2} — ABNORMAL LOW (ref >90)
Glucose: 139 mg/dl (ref 70–140)
Potassium: 3.6 mEq/L (ref 3.5–5.1)
Sodium: 142 mEq/L (ref 136–145)
Total Bilirubin: 0.23 mg/dL (ref 0.20–1.20)
Total Protein: 6.5 g/dL (ref 6.4–8.3)

## 2014-03-02 LAB — CBC & DIFF AND RETIC
BASO%: 0.5 % (ref 0.0–2.0)
Basophils Absolute: 0 10*3/uL (ref 0.0–0.1)
EOS ABS: 0.2 10*3/uL (ref 0.0–0.5)
EOS%: 2.9 % (ref 0.0–7.0)
HCT: 35.6 % (ref 34.8–46.6)
HGB: 11.5 g/dL — ABNORMAL LOW (ref 11.6–15.9)
IMMATURE RETIC FRACT: 8.6 % (ref 1.60–10.00)
LYMPH%: 37.2 % (ref 14.0–49.7)
MCH: 29 pg (ref 25.1–34.0)
MCHC: 32.3 g/dL (ref 31.5–36.0)
MCV: 89.7 fL (ref 79.5–101.0)
MONO#: 0.6 10*3/uL (ref 0.1–0.9)
MONO%: 7.8 % (ref 0.0–14.0)
NEUT%: 51.6 % (ref 38.4–76.8)
NEUTROS ABS: 3.8 10*3/uL (ref 1.5–6.5)
Platelets: 238 10*3/uL (ref 145–400)
RBC: 3.97 10*6/uL (ref 3.70–5.45)
RDW: 13.6 % (ref 11.2–14.5)
Retic %: 1.85 % (ref 0.70–2.10)
Retic Ct Abs: 73.45 10*3/uL (ref 33.70–90.70)
WBC: 7.3 10*3/uL (ref 3.9–10.3)
lymph#: 2.7 10*3/uL (ref 0.9–3.3)

## 2014-03-02 NOTE — Progress Notes (Signed)
Checked in new pt with no financial concerns prior to seeing the dr.  Pt has 2 insurances so financial assistance may not be needed but she has Raquel's card for any billing or insurance questions or concerns. ° °

## 2014-03-02 NOTE — Telephone Encounter (Signed)
gv and printed appt sched and avs fo rpt for March and April...gv pt barium

## 2014-03-03 LAB — IRON AND TIBC CHCC
%SAT: 12 % — AB (ref 21–57)
Iron: 31 ug/dL — ABNORMAL LOW (ref 41–142)
TIBC: 268 ug/dL (ref 236–444)
UIBC: 237 ug/dL (ref 120–384)

## 2014-03-03 LAB — FERRITIN CHCC: FERRITIN: 124 ng/mL (ref 9–269)

## 2014-03-04 ENCOUNTER — Encounter (INDEPENDENT_AMBULATORY_CARE_PROVIDER_SITE_OTHER): Payer: Self-pay | Admitting: General Surgery

## 2014-03-04 DIAGNOSIS — C7B8 Other secondary neuroendocrine tumors: Secondary | ICD-10-CM | POA: Diagnosis not present

## 2014-03-04 DIAGNOSIS — C7A00 Malignant carcinoid tumor of unspecified site: Secondary | ICD-10-CM | POA: Diagnosis not present

## 2014-03-04 NOTE — Progress Notes (Signed)
Patient ID: Jessica Farrell, female   DOB: 11-17-1945, 69 y.o.   MRN: [id number]  Jessica Farrell 03/04/2014 11:02 AM Location: [facility] Patient #: [id number] DOB: 12-01-1945 Divorced / Language: Cleophus Molt / Race: Black or African American, White Female History of Present Illness Jessica Hollingshead MD; 03/04/2014 11:35 AM) Patient words: f/u.  The patient is a 69 year old female    Note:Procedure: Laparoscopic ileocecectomy  Date: 02/03/14  Pathology: Malignant Carcinoid  History: She presents for a postoperative visit. She is feeling good. Bowel movements are loose and onto the 3 times a day. She has a good appetite. She has seen Dr. Burr Medico and has a plan for follow-up.  Exam: General- Is in NAD. Abd-soft, incisions are clean and intact  Other Problems Jessica Farrell, CMA; 03/04/2014 11:03 AM) Asthma Diabetes Mellitus Hypercholesterolemia Thyroid Disease  Past Surgical History Jessica Farrell, CMA; 03/04/2014 11:03 AM) No pertinent past surgical history  Diagnostic Studies History Jessica Farrell, CMA; 03/04/2014 11:03 AM) Colonoscopy within last year Mammogram 1-3 years ago  Allergies Jessica Farrell, CMA; 03/04/2014 11:04 AM) Penicillamine *ASSORTED CLASSES*  Medication History (Jessica Farrell, CMA; 03/04/2014 11:05 AM) Atorvastatin Calcium (10MG  Tablet, Oral) Active. Albuterol Sulfate ER (8MG  Tablet ER 12HR, Oral) Active. Levothyroxine Sodium (25MCG Tablet, Oral) Active. MetFORMIN HCl (500MG  Tablet, Oral) Active.  Social History Jessica Farrell, CMA; 03/04/2014 11:03 AM) Caffeine use Coffee, Tea. No alcohol use No drug use Tobacco use Never smoker.  Family History Jessica Farrell, CMA; 03/04/2014 11:03 AM) Breast Cancer Sister. Cerebrovascular Accident Father. Diabetes Mellitus Mother. Heart Disease Mother. Hypertension Mother. Thyroid problems Mother.  Pregnancy / Birth History Jessica Farrell, [place]; 03/04/2014 11:03 AM) Age at menarche  33 years. Gravida 2 Maternal age 5-25 Para 2     Review of Systems ([place]; 03/04/2014 11:03 AM) HEENT Present- Seasonal Allergies and Sore Throat. Not Present- Earache, Hearing Loss, Hoarseness, Nose Bleed, Oral Ulcers, Ringing in the Ears, Sinus Pain, Visual Disturbances, Wears glasses/contact lenses and Yellow Eyes. Gastrointestinal Present- Chronic diarrhea and Hemorrhoids. Not Present- Abdominal Pain, Bloating, Bloody Stool, Change in Bowel Habits, Constipation, Difficulty Swallowing, Excessive gas, Gets full quickly at meals, Indigestion, Nausea, Rectal Pain and Vomiting. Musculoskeletal Present- Back Pain. Not Present- Joint Pain, Joint Stiffness, Muscle Pain, Muscle Weakness and Swelling of Extremities.  Vitals (Jessica Farrell CMA; 03/04/2014 11:03 AM) 03/04/2014 11:03 AM Weight: 156 lb Height: 64in Body Surface Area: 1.79 m Body Mass Index: 26.78 kg/m Temp.: 35F(Temporal)  Pulse: 78 (Regular)  BP: 126/80 (Sitting, Left Arm, Standard)     Assessment & Plan Jessica Hollingshead MD; 03/04/2014 11:37 AM)  S/P COLECTOMY (V45.89  Z98.89) Impression: Doing well postop.  CARCINOID TUMOR OF ILEUM (209.43  D3A.012) Impression: Seen and being followed by Dr. Burr Medico  Plan: May return to light duty work. Resume full activities in one month. RTC prn.  Current Plans Instructions: Continue light activities for more month. Follow up as needed  Jessica Confer, MD

## 2014-03-06 LAB — CHROMOGRANIN A: Chromogranin A: <5 ng/mL (ref <=15)

## 2014-03-07 LAB — 5 HIAA, QUANTITATIVE, URINE, 24 HOUR
5-HIAA, 24 Hr Urine: 1.9 mg/24 h (ref <=6.0)
Volume, Urine-5HIAA: 1600 mL/24 h

## 2014-05-11 ENCOUNTER — Telehealth: Payer: Self-pay | Admitting: *Deleted

## 2014-05-11 NOTE — Telephone Encounter (Signed)
PT. HAS LOST HER PAPERWORK CONCERNING HER CT BODY SCAN. THE PHONE NUMBER FOR RADIOLOGY WAS GIVEN TO PT.

## 2014-05-18 ENCOUNTER — Encounter (HOSPITAL_COMMUNITY): Payer: Self-pay

## 2014-05-18 ENCOUNTER — Other Ambulatory Visit (HOSPITAL_BASED_OUTPATIENT_CLINIC_OR_DEPARTMENT_OTHER): Payer: Medicare Other

## 2014-05-18 ENCOUNTER — Ambulatory Visit (HOSPITAL_COMMUNITY)
Admission: RE | Admit: 2014-05-18 | Discharge: 2014-05-18 | Disposition: A | Discharge status: Home / Self Care | Payer: Medicare Other | Source: Ambulatory Visit | Attending: Hematology | Admitting: Hematology

## 2014-05-18 DIAGNOSIS — K5730 Diverticulosis of large intestine without perforation or abscess without bleeding: Secondary | ICD-10-CM | POA: Diagnosis not present

## 2014-05-18 DIAGNOSIS — Z9049 Acquired absence of other specified parts of digestive tract: Secondary | ICD-10-CM | POA: Diagnosis not present

## 2014-05-18 DIAGNOSIS — Z85060 Personal history of malignant carcinoid tumor of small intestine: Secondary | ICD-10-CM | POA: Diagnosis not present

## 2014-05-18 DIAGNOSIS — C7A012 Malignant carcinoid tumor of the ileum: Secondary | ICD-10-CM

## 2014-05-18 DIAGNOSIS — C7B09 Secondary carcinoid tumors of other sites: Secondary | ICD-10-CM | POA: Diagnosis not present

## 2014-05-18 DIAGNOSIS — C7B8 Other secondary neuroendocrine tumors: Secondary | ICD-10-CM | POA: Diagnosis not present

## 2014-05-18 DIAGNOSIS — C7A00 Malignant carcinoid tumor of unspecified site: Secondary | ICD-10-CM | POA: Diagnosis not present

## 2014-05-18 DIAGNOSIS — C7A8 Other malignant neuroendocrine tumors: Secondary | ICD-10-CM | POA: Diagnosis not present

## 2014-05-18 DIAGNOSIS — Z85038 Personal history of other malignant neoplasm of large intestine: Secondary | ICD-10-CM | POA: Diagnosis not present

## 2014-05-18 LAB — CBC & DIFF AND RETIC
BASO%: 0.4 % (ref 0.0–2.0)
Basophils Absolute: 0 10*3/uL (ref 0.0–0.1)
EOS%: 2.8 % (ref 0.0–7.0)
Eosinophils Absolute: 0.2 10*3/uL (ref 0.0–0.5)
HCT: 40.8 % (ref 34.8–46.6)
HEMOGLOBIN: 13.1 g/dL (ref 11.6–15.9)
IMMATURE RETIC FRACT: 6.1 % (ref 1.60–10.00)
LYMPH#: 2.3 10*3/uL (ref 0.9–3.3)
LYMPH%: 34.2 % (ref 14.0–49.7)
MCH: 28.6 pg (ref 25.1–34.0)
MCHC: 32.1 g/dL (ref 31.5–36.0)
MCV: 89.1 fL (ref 79.5–101.0)
MONO#: 0.5 10*3/uL (ref 0.1–0.9)
MONO%: 7.3 % (ref 0.0–14.0)
NEUT#: 3.8 10*3/uL (ref 1.5–6.5)
NEUT%: 55.3 % (ref 38.4–76.8)
PLATELETS: 230 10*3/uL (ref 145–400)
RBC: 4.58 10*6/uL (ref 3.70–5.45)
RDW: 13.5 % (ref 11.2–14.5)
Retic %: 1.26 % (ref 0.70–2.10)
Retic Ct Abs: 57.71 10*3/uL (ref 33.70–90.70)
WBC: 6.9 10*3/uL (ref 3.9–10.3)

## 2014-05-18 LAB — COMPREHENSIVE METABOLIC PANEL (CC13)
ALBUMIN: 3.6 g/dL (ref 3.5–5.0)
ALT: 62 U/L — AB (ref 0–55)
AST: 19 U/L (ref 5–34)
Alkaline Phosphatase: 155 U/L — ABNORMAL HIGH (ref 40–150)
Anion Gap: 9 mEq/L (ref 3–11)
BUN: 19.5 mg/dL (ref 7.0–26.0)
CO2: 27 mEq/L (ref 22–29)
Calcium: 9.3 mg/dL (ref 8.4–10.4)
Chloride: 107 mEq/L (ref 98–109)
Creatinine: 0.8 mg/dL (ref 0.6–1.1)
EGFR: 76 mL/min/{1.73_m2} — ABNORMAL LOW (ref >90)
Glucose: 95 mg/dl (ref 70–140)
Potassium: 4.2 mEq/L (ref 3.5–5.1)
Sodium: 143 mEq/L (ref 136–145)
Total Bilirubin: 0.22 mg/dL (ref 0.20–1.20)
Total Protein: 7.2 g/dL (ref 6.4–8.3)

## 2014-05-18 IMAGING — CT CT ABD-PELV W/ CM
2 of 5 series · 16 of 46 positions shown, 18 images · IV contrast (OMNIPAQUE)
Comparison: AP CT on [DATE] ; no prior chest CT

CLINICAL DATA: Recent colon resection for colonic neuroendocrine
carcinoma. Previous resection of small bowel carcinoid tumor.

EXAM:
CT CHEST, ABDOMEN, AND PELVIS WITH CONTRAST
TECHNIQUE: Multidetector CT imaging of the chest, abdomen and pelvis was
performed following the standard protocol during bolus
administration of intravenous contrast.
CONTRAST:  100mL OMNIPAQUE IOHEXOL 300 MG/ML  SOLN

[Series 2: cap with st · axial · 0.91mm/px · z∈[-656,-76]mm · 13 of 132 slices shown, 15 images]
[im 8/132  soft-tissue]
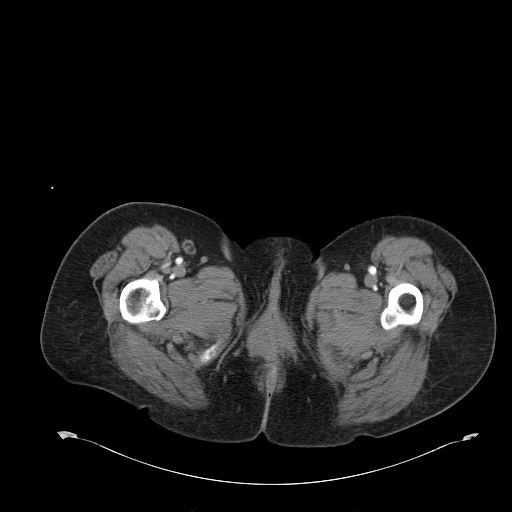
[im 8/132  bone]
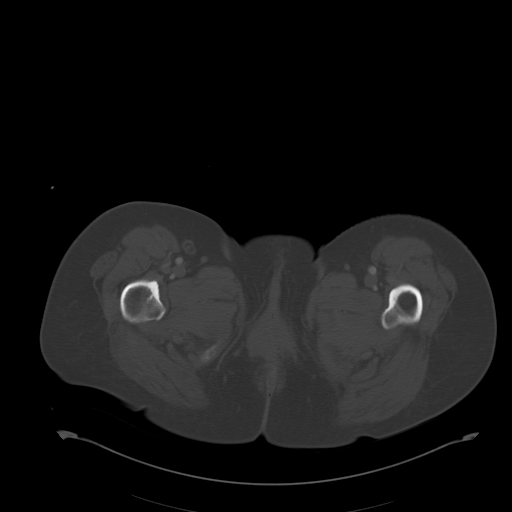
[im 15/132  soft-tissue]
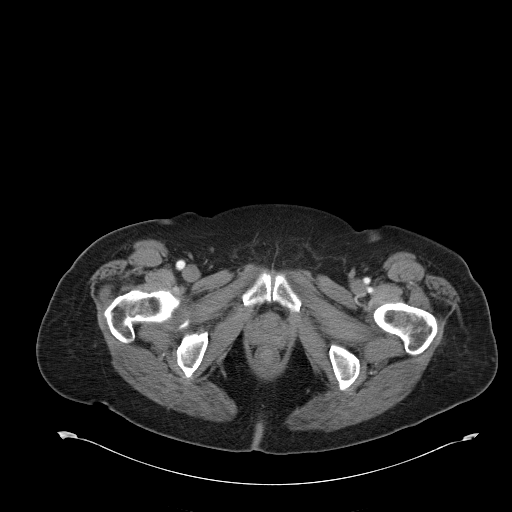
[im 30/132  soft-tissue]
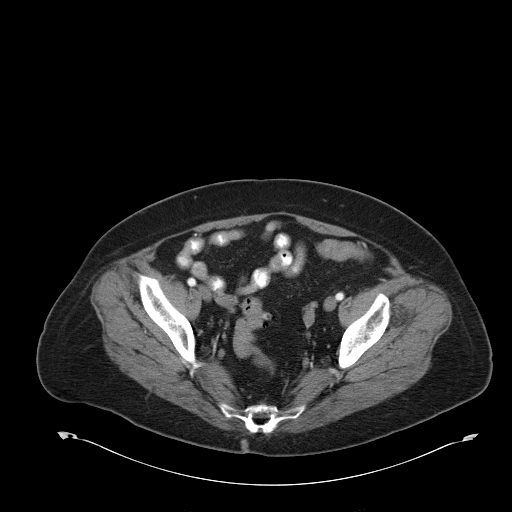
[im 37/132  soft-tissue]
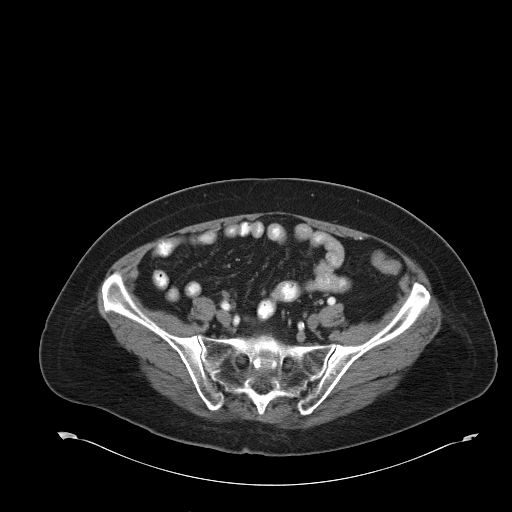
[im 44/132  soft-tissue]
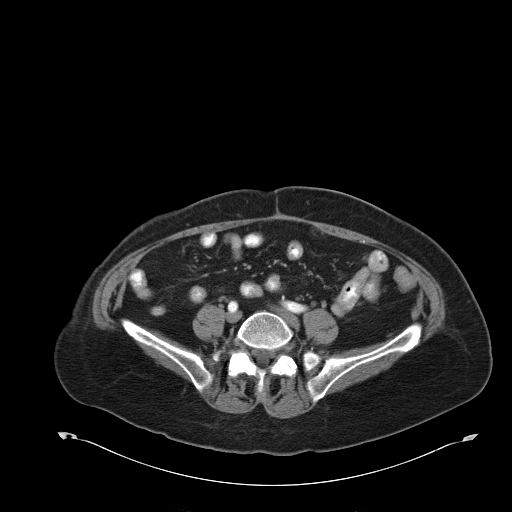
[im 59/132  soft-tissue]
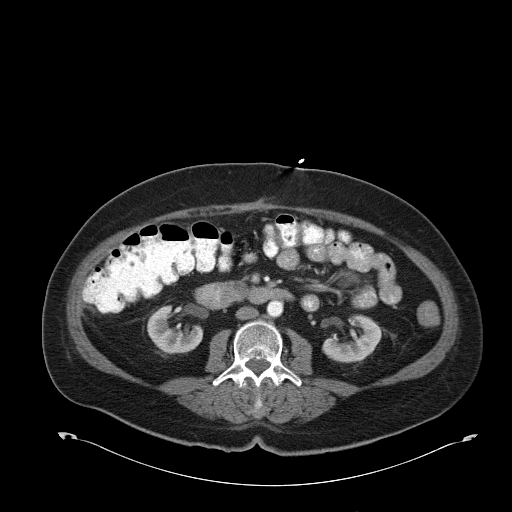
[im 66/132  soft-tissue]
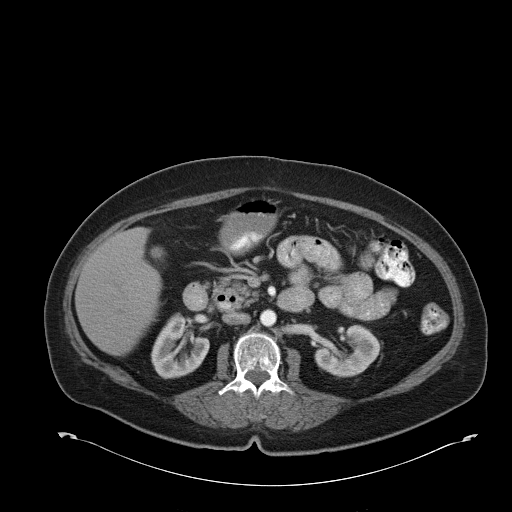
[im 73/132  soft-tissue]
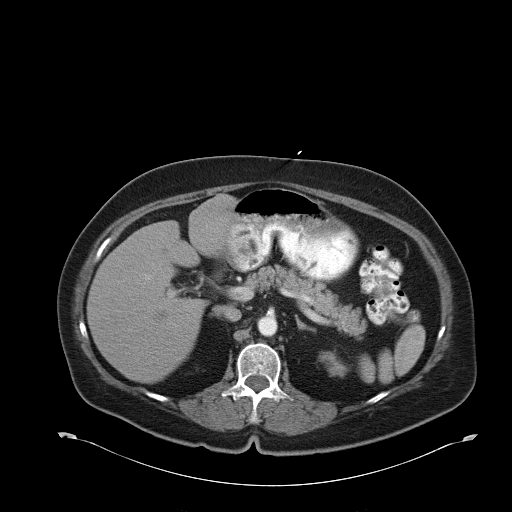
[im 88/132  soft-tissue]
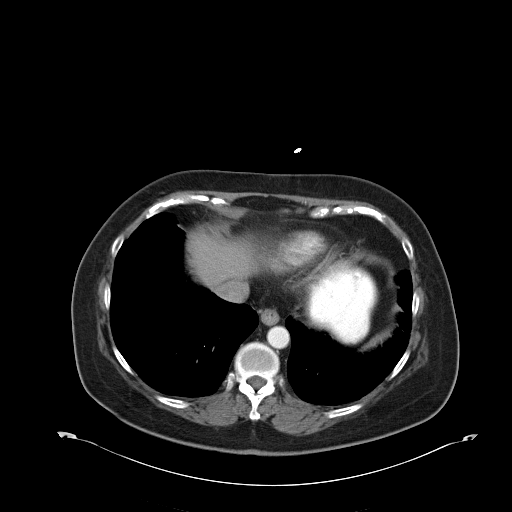
[im 88/132  bone]
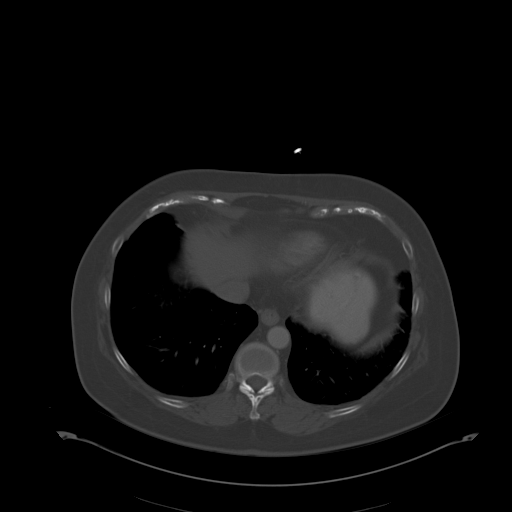
[im 95/132  soft-tissue]
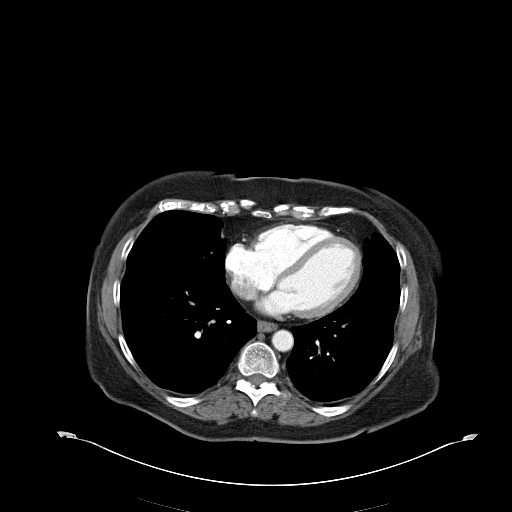
[im 102/132  soft-tissue]
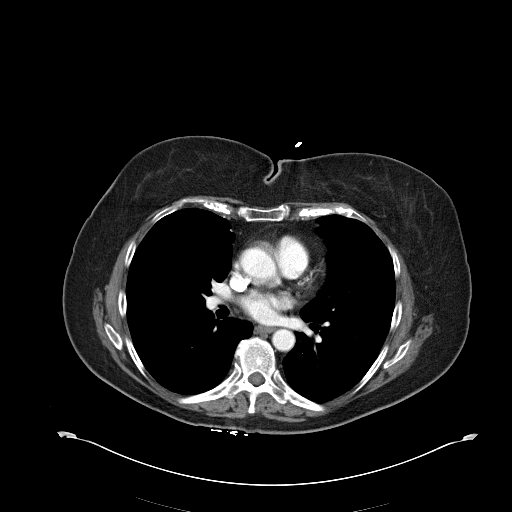
[im 117/132  soft-tissue]
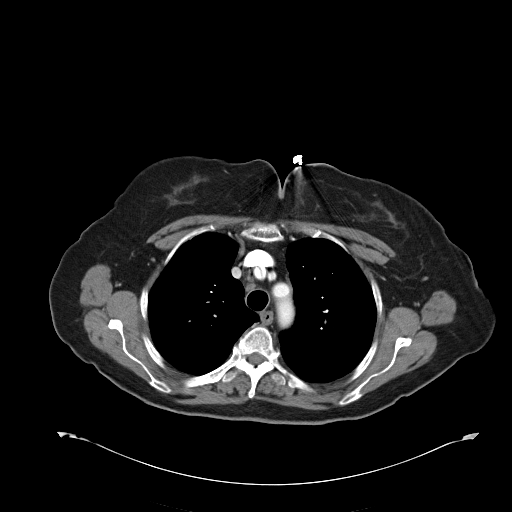
[im 124/132  soft-tissue]
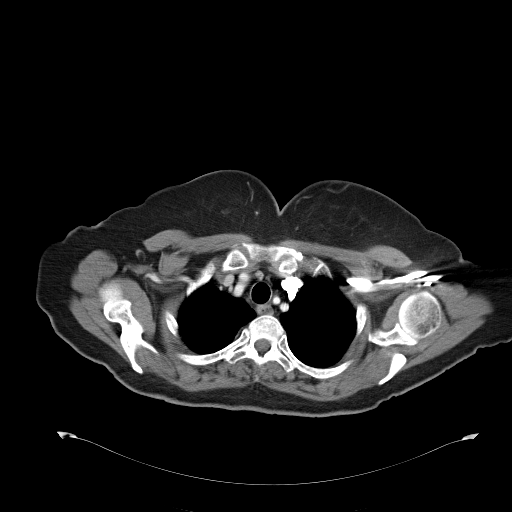

[Series 602: <mpr thick range> · coronal · 1.29mm/px · 3 of 90 slices shown]
[im 30/90  soft-tissue]
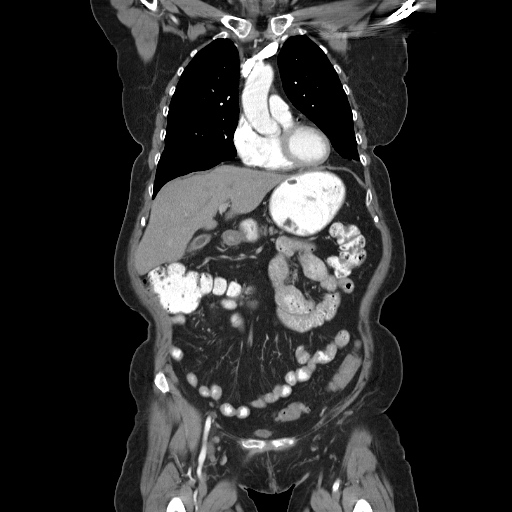
[im 40/90  soft-tissue]
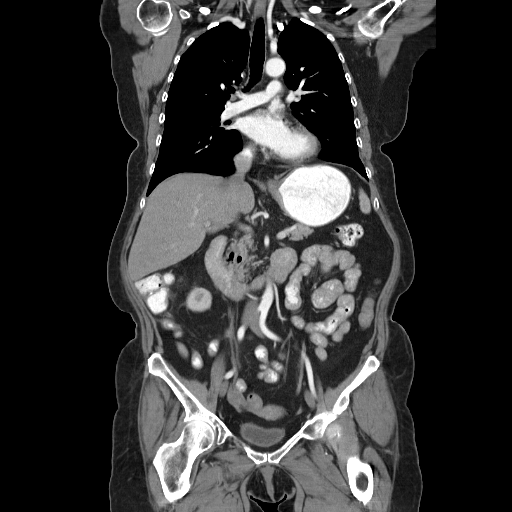
[im 50/90  soft-tissue]
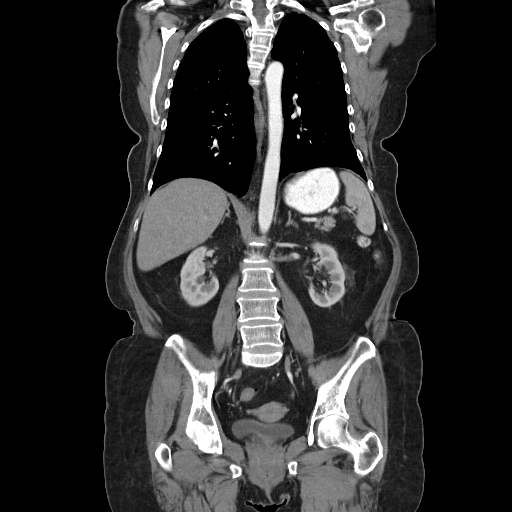

[16 of 46 positions shown; findings below may reference images not displayed]

FINDINGS: CT CHEST FINDINGS

Mediastinum/Lymph Nodes: No masses or pathologically enlarged lymph
nodes identified. 7 mm lymph node in the right paratracheal region
is noted, which is not considered pathologically enlarged.

Lungs/Pleura: No pulmonary infiltrate or mass identified. No
effusion present.

Musculoskeletal/Soft Tissues: No suspicious bone lesions or other
significant chest wall abnormality.

CT ABDOMEN AND PELVIS FINDINGS

Hepatobiliary: No masses or other significant abnormality
identified.

Pancreas: No mass, inflammatory changes, or other significant
abnormality identified.

Spleen:  Within normal limits in size and appearance.

Adrenals:  No masses identified.

Kidneys/Urinary Tract:  No evidence of masses or hydronephrosis.

Stomach/Bowel/Peritoneum: Patient has undergone resection of distal
small bowel and right colon since previous study. No residual soft
tissue masses or mesenteric lymphadenopathy identified no evidence
of bowel obstruction. Diverticulosis is again seen involving the
sigmoid colon, however there is no evidence of diverticulitis.

Vascular/Lymphatic: No pathologically enlarged lymph nodes
identified. No other significant abnormality visualized.

Reproductive:  No mass or other significant abnormality identified.

Other:  None.

Musculoskeletal:  No suspicious bone lesions identified.
IMPRESSION: Postop changes from interval resection of distal ileum and right
colon. No complication identified.

No evidence of residual carcinoma or metastatic disease within the
chest, abdomen, or pelvis.

## 2014-05-18 MED ORDER — IOHEXOL 300 MG/ML  SOLN
100.0000 mL | Freq: Once | INTRAMUSCULAR | Status: AC | PRN
  Administered 2014-05-18: 100 mL via INTRAVENOUS

## 2014-05-22 LAB — CHROMOGRANIN A: CHROMOGRANIN A: 7 ng/mL (ref <=15)

## 2014-05-25 ENCOUNTER — Other Ambulatory Visit: Payer: Medicare Other

## 2014-05-25 ENCOUNTER — Ambulatory Visit (HOSPITAL_BASED_OUTPATIENT_CLINIC_OR_DEPARTMENT_OTHER): Payer: Medicare Other | Admitting: Hematology

## 2014-05-25 ENCOUNTER — Encounter: Payer: Self-pay | Admitting: Hematology

## 2014-05-25 ENCOUNTER — Telehealth: Payer: Self-pay | Admitting: Hematology

## 2014-05-25 VITALS — BP 124/68 | HR 78 | Temp 98.1°F | Resp 18 | Ht 65.0 in | Wt 161.9 lb

## 2014-05-25 DIAGNOSIS — C7A012 Malignant carcinoid tumor of the ileum: Secondary | ICD-10-CM

## 2014-05-25 DIAGNOSIS — D500 Iron deficiency anemia secondary to blood loss (chronic): Secondary | ICD-10-CM | POA: Diagnosis not present

## 2014-05-25 DIAGNOSIS — C7B8 Other secondary neuroendocrine tumors: Secondary | ICD-10-CM

## 2014-05-25 NOTE — Progress Notes (Signed)
[place]  Telephone:(336) [phone] Fax:(336) [phone]  Clinic New Consult Note   Patient Care Team: Jessica Freeze, MD as PCP - General (Internal Medicine) Jessica Merle, MD as Consulting Physician (Hematology) Jessica Confer, MD as Consulting Physician (General Surgery) 05/25/2014  CHIEF COMPLAINTS/PURPOSE OF CONSULTATION:  Carcinoid tumor    Oncology History   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   Staging form: Colon and Rectum, AJCC 7th Edition     Clinical: T3, N2, M0 - Unsigned       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   01/21/2014 Imaging CT: Distal small bowel obstruction due to 3.4 cm soft tissue mass near the ileocecal valve, with adjacent right lower quadrant mesenteric lymphadenopathy   02/03/2014 Pathologic Stage invasive well diff neuroendocrine tumor (carcinoid), 3cm, pT3pN2 (5/25 nodes positive). negative margines.   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     HISTORY OF PRESENTING ILLNESS: Jessica Farrell is very nice 69 year old white female who was referred by GI Dr. Zella Farrell for newly diagnosed small intestine carcinoid tumor.   She presented with 5-6 week history of mid and generalized abdominal discomfort with episodes of sharp intense abdominal pain, intermittent nausea and vomiting and diarrhea with loose stools generally postprandially. She was referred to GI Dr. Lillia Farrell who ordered a CT of the abdomen which was done on 01/21/2014. It shows a 2.6 x 3.4 cm soft tissue mass at the ileocecal valve there is mild adjacent adenopathy. There is also moderate dilation of the distal small bowel loops. Unclear whether this represents an adenocarcinoma or possibly a carcinoid. She underwent Colonoscopy 01/28/2014 and a circumferential mass was found within  the ileocecal valve and partially obstructing the lumen ,multiple biopsies were performed which came back negative for malignancy.   She presented to ED with abdominal pain, nausea and vomiting on 02/03/2014 and was admitted for SBO, and underwent Laparoscopic-assisted right colectomy and resection of distal ileum on 02/03/2014 by Dr. Zella Farrell .   She has recovered well after surgery. The incision related pain is near resolved, still has small discharge form the ssurgical wound. She eats well, she has been having diarrhea since the surgery, improved, 3 loose BM daily now. She lost about 50 lbs in the past 2 years, which she contributes mainly to her diet control for DM.   CURRENT THERAPY: Observation   INTERIM HISTORY: Jessica Farrell returns for follow-up. She is doing very well, denies any pain, nausea, hot flash. She still has small loose bowel movement 2-3 times a day, she drinks very well at home. She denies other new symptoms. She has good appetite and energy level, functions well at home.    MEDICAL HISTORY:  Past Medical History  Diagnosis Date  . Asthma   . Hyperlipidemia   . Diabetes mellitus 05/05/2012    Type II  . Thyroid disease 1990's    HypoThyroidism  . Cellulitis March  2014    Left foot and ankle  . Morbid obesity     SURGICAL HISTORY: Past Surgical History  Procedure Laterality Date  . Fracture surgery  2000    Ankle  . Tooth extraction      wisdom Teeth  . Colon resection N/A 02/03/2014    Procedure: LAPAROSCOPIC ASSISTED BOWEL RESECTION;  Surgeon: Jessica Confer, MD;  Location: [facility];  Service: General;  Laterality: N/A;    SOCIAL HISTORY:  History   Social History  . Marital Status: Divorced    Spouse Name: N/A    Number of Children: 2  . Years of Education: N/A   Occupational History  . Secretary, still work part time    Social History Main Topics  . Smoking status: Never Smoker   . Smokeless tobacco: Never Used  . Alcohol Use: No  . Drug Use:  No  . Sexual Activity: Not Currently    Birth Control/ Protection: Post-menopausal   Other Topics Concern  . Not on file   Social History Narrative    FAMILY HISTORY: Family History  Problem Relation Age of Onset  . Alzheimer's disease Father   . Heart disease Mother   . Arthritis Mother   . Hypertension Mother   . Alzheimer's disease Sister   . Cancer Sister 50    breast cancer   . Heart disease Brother     Heart Disease before age 43  . Colon cancer Neg Hx   . Esophageal cancer Neg Hx   . Rectal cancer Neg Hx   . Stomach cancer Neg Hx     ALLERGIES:  is allergic to penicillins.  MEDICATIONS:  Current Outpatient Prescriptions  Medication Sig Dispense Refill  . Albuterol Sulfate 108 (90 BASE) MCG/ACT AEPB Inhale 2 puffs into the lungs every 4 (four) hours as needed (wheezing/SOB).     Marland Kitchen atorvastatin (LIPITOR) 10 MG tablet Take 10 mg by mouth daily.    Marland Kitchen HYDROcodone-acetaminophen (NORCO/VICODIN) 5-325 MG per tablet Take 1-2 tablets by mouth every 4 (four) hours as needed for moderate pain or severe pain. (Patient not taking: Reported on 03/02/2014) 40 tablet 0  . levothyroxine (SYNTHROID, LEVOTHROID) 25 MCG tablet Take 1 tablet (25 mcg total) by mouth daily before breakfast. 30 tablet 0  . metFORMIN (GLUCOPHAGE) 500 MG tablet Take 1 tablet (500 mg total) by mouth 2 (two) times daily with a meal. 60 tablet 0  . ondansetron (ZOFRAN) 4 MG tablet Take 1 tab every 6 hours as needed for nausea. (Patient not taking: Reported on 03/02/2014) 30 tablet 0  . traMADol (ULTRAM) 50 MG tablet Take 1 tablet (50 mg total) by mouth every 6 (six) hours as needed. (Patient not taking: Reported on 03/02/2014) 40 tablet 0   No current facility-administered medications for this visit.    REVIEW OF SYSTEMS:   Constitutional: Denies fevers, chills or abnormal night sweats Eyes: Denies blurriness of vision, double vision or watery eyes Ears, nose, mouth, throat, and face: Denies mucositis or sore  throat Respiratory: Denies cough, dyspnea or wheezes Cardiovascular: Denies palpitation, chest discomfort or lower extremity swelling Gastrointestinal:  Denies nausea, heartburn or change in bowel habits Skin: Denies abnormal skin rashes Lymphatics: Denies new lymphadenopathy or easy bruising Neurological:Denies numbness, tingling or new weaknesses Behavioral/Psych: Mood is stable, no new changes  All other systems were reviewed with the patient and are negative.  PHYSICAL EXAMINATION: ECOG PERFORMANCE STATUS: 0 - Asymptomatic  There were no vitals filed for this visit. There were no vitals filed for this visit.  GENERAL:alert, no distress and comfortable SKIN: skin color, texture, turgor are normal, no rashes or significant lesions EYES: normal, conjunctiva are pink and non-injected, sclera clear OROPHARYNX:no exudate, no erythema and lips, buccal mucosa, and tongue normal  NECK: supple, thyroid normal size, non-tender, without nodularity LYMPH:  no palpable lymphadenopathy in the cervical, axillary or inguinal LUNGS: clear to auscultation and percussion with normal breathing effort HEART: regular rate & rhythm and no  murmurs and no lower extremity edema ABDOMEN:abdomen soft, non-tender and normal bowel sounds Musculoskeletal:no cyanosis of digits and no clubbing  PSYCH: alert & oriented x 3 with fluent speech NEURO: no focal motor/sensory deficits  LABORATORY DATA:  I have reviewed the data as listed Lab Results  Component Value Date   WBC 6.9 05/18/2014   HGB 13.1 05/18/2014   HCT 40.8 05/18/2014   MCV 89.1 05/18/2014   PLT 230 05/18/2014    Recent Labs  02/03/14 0400 02/04/14 0434 02/06/14 0507 03/02/14 1547 05/18/14 1545  NA 139 137 140 142 143  K 3.9 4.2 4.0 3.6 4.2  CL 103 101 102  --   --   CO2 25 28 27 26 27   GLUCOSE 155* 141* 127* 139 95  BUN 3* 4* 5* 13.8 19.5  CREATININE 0.59 0.55 0.61 0.8 0.8  CALCIUM 8.9 8.4 8.5 8.9 9.3  GFRNONAA >90 >90 >90  --    --   GFRAA >90 >90 >90  --   --   PROT 6.4  --   --  6.5 7.2  ALBUMIN 2.9*  --   --  3.3* 3.6  AST 11  --   --  14 19  ALT 7  --   --  14 62*  ALKPHOS 118*  --   --  123 155*  BILITOT 0.2*  --   --  0.23 0.22    PATHOLOGY REPORT  Small intestine, resection for tumor, ascending 02/03/2014 - INVASIVE WELL DIFFERENTIATED NEUROENDOCRINE TUMOR (CARCINOID), SPANNING 3 CM IN GREATEST DIMENSION. - TUMOR INVADES MUSCULARIS PROPRIA TO INVOLVE SUBSEROSAL SOFT TISSUES. - MARGINS ARE NEGATIVE. - FIVE OF TWENTY-FIVE LYMPH NODES ARE POSITIVE FOR METASTATIC LOW GRADE NEUROENDOCRINE TUMOR (5/25). - EXTRACAPSULAR EXTENSION IS IDENTIFIED. - SEE ONCOLOGY TEMPLATE. Specimen: Portion of terminal ileum with attached proximal portion of right colon. Procedure: Laparoscopic assisted right colectomy and resection of distal ileum. Tumor Site: Distal terminal ileum, 3 cm proximal to ileocecal valve. Tumor Size: 3 cm in greatest dimension. Tumor Focality: Unifocal. Histologic Type: Well differentiated neuroendocrine tumor (carcinoid tumor) . Histologic Grade : G1: Low grade. Mitotic Rate low mitotic rate, less than 1/10 mitoses per hpf (high power field). Microscopic Tumor Extension: Tumor invades through muscularis propria to involve subserosal soft tissues, Margins: Proximal Margin: Negative. Distal Margin: Negative. Mesenteric (Radial) Margin : Negative. All margins uninvolved by neuroendocrine tumor: Distance from closest margin: 4.5 cm Margin: Mesenteric margin. Lymph-Vascular Invasion: Tumor is seen involving a subcapsular sinus space (lymphatic space) in one of the lymph nodes. Perineural Invasion: Not identified. Lymph nodes: number examined - 25; number positive: 5. TNM Staging: pT3, pN1.   RADIOGRAPHIC STUDIES: I have personally reviewed the radiological images as listed and agreed with the findings in the report.  CT abdomen and pelvis 05/18/2014 IMPRESSION: Postop changes from interval  resection of distal ileum and right colon. No complication identified.  No evidence of residual carcinoma or metastatic disease within the chest, abdomen, or pelvis.   ASSESSMENT & PLAN:  69 year old female with newly diagnosed carcinoid tumor at the terminal ileum near the ileumcecal valve.  1. 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 Surgical margins were negative. -I discussed her diagnosis and the staging with her in great details.  -There is no role of adjuvant chemotherapy for resected carcinoid tumor.  -We discussed the risks of tumor recurrence in the future, including late recurrence. -Her postop chromogranin  A and urine 5 HIAA level were normal. Those were not checked before surgery. -I reviewed her restaging CT scan of chest and abdomen and pelvis, which showed no evidence of recurrence. -I recommend a total of 10 years surveillance, with office visit, lab 4 every 3-6 months for the first 2 years, then every 6-12 months afterwards. CT scans can be considered for surveillance, per [place] and guideline.  2. Anemia -She had a mild anemias before the surgery, which could related to tumor bleeding. -Her iron level and saturation was slightly low, and ferritin, were normal. -Her hemoglobin was 13 last week, anemia has resolved.  Plan: Return to clinic in 6 months with lab 1 week before.  All questions were answered. The patient knows to call the clinic with any problems, questions or concerns. I spent 20 minutes counseling the patient face to face. The total time spent in the appointment was 30 minutes and more than 50% was on counseling.     Jessica Merle, MD 05/25/2014 3:58 PM

## 2014-05-25 NOTE — Telephone Encounter (Signed)
Gave avs & calendar for October. °

## 2014-06-03 DIAGNOSIS — E039 Hypothyroidism, unspecified: Secondary | ICD-10-CM | POA: Diagnosis not present

## 2014-06-03 DIAGNOSIS — E1149 Type 2 diabetes mellitus with other diabetic neurological complication: Secondary | ICD-10-CM | POA: Diagnosis not present

## 2014-06-03 DIAGNOSIS — J45909 Unspecified asthma, uncomplicated: Secondary | ICD-10-CM | POA: Diagnosis not present

## 2014-06-03 DIAGNOSIS — Z0000 Encounter for general adult medical examination without abnormal findings: Secondary | ICD-10-CM | POA: Diagnosis not present

## 2014-06-03 DIAGNOSIS — I872 Venous insufficiency (chronic) (peripheral): Secondary | ICD-10-CM | POA: Diagnosis not present

## 2014-06-03 DIAGNOSIS — Z6825 Body mass index (BMI) 25.0-25.9, adult: Secondary | ICD-10-CM | POA: Diagnosis not present

## 2014-06-03 DIAGNOSIS — D649 Anemia, unspecified: Secondary | ICD-10-CM | POA: Diagnosis not present

## 2014-06-03 DIAGNOSIS — E785 Hyperlipidemia, unspecified: Secondary | ICD-10-CM | POA: Diagnosis not present

## 2014-06-04 DIAGNOSIS — E1149 Type 2 diabetes mellitus with other diabetic neurological complication: Secondary | ICD-10-CM | POA: Diagnosis not present

## 2014-06-04 DIAGNOSIS — E785 Hyperlipidemia, unspecified: Secondary | ICD-10-CM | POA: Diagnosis not present

## 2014-06-04 DIAGNOSIS — E039 Hypothyroidism, unspecified: Secondary | ICD-10-CM | POA: Diagnosis not present

## 2014-11-23 ENCOUNTER — Other Ambulatory Visit (HOSPITAL_BASED_OUTPATIENT_CLINIC_OR_DEPARTMENT_OTHER): Payer: Medicare Other

## 2014-11-23 DIAGNOSIS — C7A012 Malignant carcinoid tumor of the ileum: Secondary | ICD-10-CM | POA: Diagnosis present

## 2014-11-23 DIAGNOSIS — D649 Anemia, unspecified: Secondary | ICD-10-CM

## 2014-11-23 DIAGNOSIS — C7A00 Malignant carcinoid tumor of unspecified site: Secondary | ICD-10-CM | POA: Diagnosis not present

## 2014-11-23 DIAGNOSIS — C7B8 Other secondary neuroendocrine tumors: Secondary | ICD-10-CM

## 2014-11-23 DIAGNOSIS — C7B09 Secondary carcinoid tumors of other sites: Secondary | ICD-10-CM | POA: Diagnosis not present

## 2014-11-23 LAB — CBC & DIFF AND RETIC
BASO%: 0.4 % (ref 0.0–2.0)
BASOS ABS: 0 10*3/uL (ref 0.0–0.1)
EOS ABS: 0.2 10*3/uL (ref 0.0–0.5)
EOS%: 3 % (ref 0.0–7.0)
HEMATOCRIT: 39.4 % (ref 34.8–46.6)
HEMOGLOBIN: 12.9 g/dL (ref 11.6–15.9)
IMMATURE RETIC FRACT: 12.2 % — AB (ref 1.60–10.00)
LYMPH#: 2.6 10*3/uL (ref 0.9–3.3)
LYMPH%: 35.4 % (ref 14.0–49.7)
MCH: 29.3 pg (ref 25.1–34.0)
MCHC: 32.7 g/dL (ref 31.5–36.0)
MCV: 89.5 fL (ref 79.5–101.0)
MONO#: 0.5 10*3/uL (ref 0.1–0.9)
MONO%: 7.1 % (ref 0.0–14.0)
NEUT#: 4 10*3/uL (ref 1.5–6.5)
NEUT%: 54.1 % (ref 38.4–76.8)
PLATELETS: 202 10*3/uL (ref 145–400)
RBC: 4.4 10*6/uL (ref 3.70–5.45)
RDW: 13.1 % (ref 11.2–14.5)
RETIC %: 1.44 % (ref 0.70–2.10)
RETIC CT ABS: 63.36 10*3/uL (ref 33.70–90.70)
WBC: 7.3 10*3/uL (ref 3.9–10.3)

## 2014-11-23 LAB — COMPREHENSIVE METABOLIC PANEL (CC13)
ALT: 31 U/L (ref 0–55)
ANION GAP: 8 meq/L (ref 3–11)
AST: 20 U/L (ref 5–34)
Albumin: 3.5 g/dL (ref 3.5–5.0)
Alkaline Phosphatase: 137 U/L (ref 40–150)
BUN: 14.6 mg/dL (ref 7.0–26.0)
CALCIUM: 9 mg/dL (ref 8.4–10.4)
CHLORIDE: 109 meq/L (ref 98–109)
CO2: 25 meq/L (ref 22–29)
Creatinine: 0.8 mg/dL (ref 0.6–1.1)
EGFR: 76 mL/min/{1.73_m2} — ABNORMAL LOW (ref >90)
Glucose: 164 mg/dl — ABNORMAL HIGH (ref 70–140)
POTASSIUM: 4.2 meq/L (ref 3.5–5.1)
Sodium: 142 mEq/L (ref 136–145)
Total Bilirubin: 0.38 mg/dL (ref 0.20–1.20)
Total Protein: 7 g/dL (ref 6.4–8.3)

## 2014-11-26 LAB — CHROMOGRANIN A: CHROMOGRANIN A: 7 ng/mL (ref <=15)

## 2014-11-30 ENCOUNTER — Telehealth: Payer: Self-pay | Admitting: Hematology

## 2014-11-30 ENCOUNTER — Ambulatory Visit (HOSPITAL_BASED_OUTPATIENT_CLINIC_OR_DEPARTMENT_OTHER): Payer: Medicare Other | Admitting: Hematology

## 2014-11-30 ENCOUNTER — Encounter: Payer: Self-pay | Admitting: Hematology

## 2014-11-30 VITALS — BP 133/68 | HR 60 | Temp 97.7°F | Resp 18 | Ht 65.0 in | Wt 184.6 lb

## 2014-11-30 DIAGNOSIS — E119 Type 2 diabetes mellitus without complications: Secondary | ICD-10-CM | POA: Diagnosis not present

## 2014-11-30 DIAGNOSIS — Z23 Encounter for immunization: Secondary | ICD-10-CM

## 2014-11-30 DIAGNOSIS — C7A012 Malignant carcinoid tumor of the ileum: Secondary | ICD-10-CM

## 2014-11-30 DIAGNOSIS — I10 Essential (primary) hypertension: Secondary | ICD-10-CM

## 2014-11-30 DIAGNOSIS — C7B8 Other secondary neuroendocrine tumors: Secondary | ICD-10-CM

## 2014-11-30 DIAGNOSIS — E039 Hypothyroidism, unspecified: Secondary | ICD-10-CM

## 2014-11-30 MED ORDER — INFLUENZA VAC SPLIT QUAD 0.5 ML IM SUSY
0.5000 mL | PREFILLED_SYRINGE | Freq: Once | INTRAMUSCULAR | Status: AC
  Administered 2014-11-30: 0.5 mL via INTRAMUSCULAR
  Filled 2014-11-30: qty 0.5

## 2014-11-30 NOTE — Progress Notes (Signed)
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 negative margines.   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     HISTORY OF PRESENTING ILLNESS: Jessica Farrell is very nice 69 year old white female who was referred by GI Dr. Zella Richer for newly diagnosed small intestine carcinoid tumor.   She presented with 5-6 week history of mid and generalized abdominal discomfort with episodes of sharp intense abdominal pain, intermittent nausea and vomiting and diarrhea with loose stools generally postprandially. She was referred to GI Dr. Lillia Dallas who ordered a CT of the abdomen which was done on 01/21/2014. It shows a 2.6 x 3.4 cm soft tissue mass at the ileocecal valve there is mild adjacent adenopathy. There is also moderate dilation of the distal small bowel loops. Unclear whether this represents an adenocarcinoma or possibly a carcinoid. She underwent Colonoscopy 01/28/2014 and a circumferential mass was found within the  ileocecal valve and partially obstructing the lumen ,multiple biopsies were performed which came back negative for malignancy.   She presented to ED with abdominal pain, nausea and vomiting on 02/03/2014 and was admitted for SBO, and underwent Laparoscopic-assisted right colectomy and resection of distal ileum on 02/03/2014 by Dr. Zella Richer .   She has recovered well after surgery. The incision related pain is near resolved, still has small discharge form the ssurgical wound. She eats well, she has been having diarrhea since the surgery, improved, 3 loose BM daily now. She lost about 50 lbs in the past 2 years, which she contributes mainly to her diet control for DM.   CURRENT THERAPY: Observation   INTERIM HISTORY: [name] returns for follow-up. She is doing very well, she still has mild diarrhea, 1-3 time a day, no abdominal pain, nausea. She has mild flushing in the morning when she wakes up, no night sweats. She has good appetite and eating well. Her weight is stable. No other complaints.   MEDICAL HISTORY:  Past Medical History  Diagnosis Date  . Asthma   . Hyperlipidemia   . Diabetes mellitus ([place]) 05/05/2012    Type II  . Thyroid disease 1990's    HypoThyroidism  . Cellulitis March  2014    Left foot and ankle  . Morbid obesity ([place])     SURGICAL HISTORY: Past Surgical History  Procedure Laterality Date  . Fracture surgery  2000    Ankle  . Tooth extraction      wisdom Teeth  . Colon resection N/A 02/03/2014    Procedure: LAPAROSCOPIC ASSISTED BOWEL RESECTION;  Surgeon: Jackolyn Confer, MD;  Location: [facility];  Service: General;  Laterality: N/A;  SOCIAL HISTORY: History   Social History  . Marital Status: Divorced    Spouse Name: N/A    Number of Children: 2  . Years of Education: N/A   Occupational History  . Secretary, still work part time    Social History Main Topics  . Smoking status: Never Smoker   . Smokeless tobacco: Never Used  . Alcohol Use: No    . Drug Use: No  . Sexual Activity: Not Currently    Birth Control/ Protection: Post-menopausal   Other Topics Concern  . Not on file   Social History Narrative    FAMILY HISTORY: Family History  Problem Relation Age of Onset  . Alzheimer's disease Father   . Heart disease Mother   . Arthritis Mother   . Hypertension Mother   . Alzheimer's disease Sister   . Cancer Sister 52    breast cancer   . Heart disease Brother     Heart Disease before age 11  . Colon cancer Neg Hx   . Esophageal cancer Neg Hx   . Rectal cancer Neg Hx   . Stomach cancer Neg Hx     ALLERGIES:  is allergic to penicillins.  MEDICATIONS:  Current Outpatient Prescriptions  Medication Sig Dispense Refill  . Albuterol Sulfate 108 (90 BASE) MCG/ACT AEPB Inhale 2 puffs into the lungs every 4 (four) hours as needed (wheezing/SOB).     Marland Kitchen atorvastatin (LIPITOR) 10 MG tablet Take 10 mg by mouth daily.    Marland Kitchen levothyroxine (SYNTHROID, LEVOTHROID) 25 MCG tablet Take 1 tablet (25 mcg total) by mouth daily before breakfast. 30 tablet 0  . metFORMIN (GLUCOPHAGE) 500 MG tablet Take 1 tablet (500 mg total) by mouth 2 (two) times daily with a meal. 60 tablet 0  . ondansetron (ZOFRAN) 4 MG tablet Take 1 tab every 6 hours as needed for nausea. (Patient not taking: Reported on 03/02/2014) 30 tablet 0   No current facility-administered medications for this visit.    REVIEW OF SYSTEMS:   Constitutional: Denies fevers, chills or abnormal night sweats Eyes: Denies blurriness of vision, double vision or watery eyes Ears, nose, mouth, throat, and face: Denies mucositis or sore throat Respiratory: Denies cough, dyspnea or wheezes Cardiovascular: Denies palpitation, chest discomfort or lower extremity swelling Gastrointestinal:  Denies nausea, heartburn or change in bowel habits Skin: Denies abnormal skin rashes Lymphatics: Denies new lymphadenopathy or easy bruising Neurological:Denies numbness, tingling or new  weaknesses Behavioral/Psych: Mood is stable, no new changes  All other systems were reviewed with the patient and are negative.  PHYSICAL EXAMINATION: ECOG PERFORMANCE STATUS: 0 - Asymptomatic  Filed Vitals:   11/30/14 1240  BP: 133/68  Pulse: 60  Temp: 97.7 F (36.5 C)  Resp: 18   Filed Weights   11/30/14 1240  Weight: 184 lb 9.6 oz (83.734 kg)    GENERAL:alert, no distress and comfortable SKIN: skin color, texture, turgor are normal, no rashes or significant lesions EYES: normal, conjunctiva are pink and non-injected, sclera clear OROPHARYNX:no exudate, no erythema and lips, buccal mucosa, and tongue normal  NECK: supple, thyroid normal size, non-tender, without nodularity LYMPH:  no palpable lymphadenopathy in the cervical, axillary or inguinal LUNGS: clear to auscultation and percussion with normal breathing effort HEART: regular rate & rhythm and no murmurs and no lower extremity edema ABDOMEN:abdomen soft, non-tender and normal bowel sounds Musculoskeletal:no cyanosis of digits and no clubbing  PSYCH: alert & oriented x 3 with fluent speech NEURO: no focal motor/sensory deficits  LABORATORY DATA:  I have reviewed the data as listed Lab Results  Component Value Date   WBC 7.3 11/23/2014   HGB 12.9 11/23/2014   HCT 39.4 11/23/2014   MCV 89.5 11/23/2014   PLT 202 11/23/2014    Recent Labs  02/03/14 0400 02/04/14 0434 02/06/14 0507 03/02/14 1547 05/18/14 1545 11/23/14 1152  NA 139 137 140 142 143 142  K 3.9 4.2 4.0 3.6 4.2 4.2  CL 103 101 102  --   --   --   CO2 25 28 27 26 27 25   GLUCOSE 155* 141* 127* 139 95 164*  BUN 3* 4* 5* 13.8 19.5 14.6  CREATININE 0.59 0.55 0.61 0.8 0.8 0.8  CALCIUM 8.9 8.4 8.5 8.9 9.3 9.0  GFRNONAA >90 >90 >90  --   --   --   GFRAA >90 >90 >90  --   --   --   PROT 6.4  --   --  6.5 7.2 7.0  ALBUMIN 2.9*  --   --  3.3* 3.6 3.5  AST 11  --   --  14 19 20   ALT 7  --   --  14 62* 31  ALKPHOS 118*  --   --  123 155* 137   BILITOT 0.2*  --   --  0.23 0.22 0.38   Chromogranin A  Status: Finalresult Visible to patient:  Not Released Nextappt: Today at 12:45 PM in Oncology [facility] Medico, Jessica Blue, MD) Dx:  Metastatic malignant neuroendocrine t...           Ref Range 7d ago  42mo ago  62mo ago     Chromogranin A <=15 ng/mL 7 7CM <5R, CM          PATHOLOGY REPORT  Small intestine, resection for tumor, ascending 02/03/2014 - INVASIVE WELL DIFFERENTIATED NEUROENDOCRINE TUMOR (CARCINOID), SPANNING 3 CM IN GREATEST DIMENSION. - TUMOR INVADES MUSCULARIS PROPRIA TO INVOLVE SUBSEROSAL SOFT TISSUES. - MARGINS ARE NEGATIVE. - FIVE OF TWENTY-FIVE LYMPH NODES ARE POSITIVE FOR METASTATIC LOW GRADE NEUROENDOCRINE TUMOR (5/25). - EXTRACAPSULAR EXTENSION IS IDENTIFIED. - SEE ONCOLOGY TEMPLATE. Specimen: Portion of terminal ileum with attached proximal portion of right colon. Procedure: Laparoscopic assisted right colectomy and resection of distal ileum. Tumor Site: Distal terminal ileum, 3 cm proximal to ileocecal valve. Tumor Size: 3 cm in greatest dimension. Tumor Focality: Unifocal. Histologic Type: Well differentiated neuroendocrine tumor (carcinoid tumor) . Histologic Grade : G1: Low grade. Mitotic Rate low mitotic rate, less than 1/10 mitoses per hpf (high power field). Microscopic Tumor Extension: Tumor invades through muscularis propria to involve subserosal soft tissues, Margins: Proximal Margin: Negative. Distal Margin: Negative. Mesenteric (Radial) Margin : Negative. All margins uninvolved by neuroendocrine tumor: Distance from closest margin: 4.5 cm Margin: Mesenteric margin. Lymph-Vascular Invasion: Tumor is seen involving a subcapsular sinus space (lymphatic space) in one of the lymph nodes. Perineural Invasion: Not identified. Lymph nodes: number examined - 25; number positive: 5. TNM Staging: pT3, pN1.   RADIOGRAPHIC STUDIES: I have personally reviewed the radiological images as  listed and agreed with the findings in the report.  CT abdomen and pelvis 05/18/2014 IMPRESSION: Postop changes from interval resection of distal ileum and right colon. No complication identified.  No evidence of residual carcinoma or metastatic disease within the chest, abdomen, or pelvis.   ASSESSMENT & PLAN:  69 year old female with newly diagnosed carcinoid tumor at the terminal ileum near the ileumcecal valve.  1. Well differentiated low-grade neuroendocrine tumor of terminal ileum (carcinoid tumor), pT3 N1 M0, stage  IIIB -Her tumor has been completely resected. Surgical margins were negative. -I discussed her diagnosis and the staging with her in great details.  -There is no role of adjuvant chemotherapy for resected carcinoid tumor.  -We discussed the risks of tumor recurrence in the future, including late recurrence. -Her postop chromogranin A and urine 5 HIAA level were normal. Those were not checked before surgery. -I recommend a total of 10 years surveillance, with office visit, lab every 6 months for the first 2 years, then every 6-12 months afterwards. CT scans can be considered for surveillance, per [place] and guideline. -I'll see her back in 6 months with a restaging CT scan and lab.  2. HTN, DM, hypothyroidism -She'll continue follow-up with her primary care physician  Plan: Return to clinic in 6 months with lab and CT  ABD/PEL 1 week before.  All questions were answered. The patient knows to call the clinic with any problems, questions or concerns. I spent 20 minutes counseling the patient face to face. The total time spent in the appointment was 30 minutes and more than 50% was on counseling.     Truitt Merle, MD 11/30/2014 12:51 PM

## 2014-11-30 NOTE — Telephone Encounter (Signed)
Gave and printed appt sched and avs for pt for April 2017 °

## 2015-03-15 ENCOUNTER — Encounter: Payer: Self-pay | Admitting: *Deleted

## 2015-03-18 ENCOUNTER — Encounter: Payer: Self-pay | Admitting: Gastroenterology

## 2015-05-10 ENCOUNTER — Ambulatory Visit (INDEPENDENT_AMBULATORY_CARE_PROVIDER_SITE_OTHER): Payer: Medicare Other | Admitting: Physician Assistant

## 2015-05-10 ENCOUNTER — Telehealth: Payer: Self-pay | Admitting: *Deleted

## 2015-05-10 VITALS — BP 118/78 | HR 85 | Temp 98.2°F | Resp 18 | Ht 65.0 in | Wt 193.0 lb

## 2015-05-10 DIAGNOSIS — R309 Painful micturition, unspecified: Secondary | ICD-10-CM | POA: Diagnosis not present

## 2015-05-10 DIAGNOSIS — N10 Acute tubulo-interstitial nephritis: Secondary | ICD-10-CM

## 2015-05-10 DIAGNOSIS — E1165 Type 2 diabetes mellitus with hyperglycemia: Secondary | ICD-10-CM

## 2015-05-10 DIAGNOSIS — R7309 Other abnormal glucose: Secondary | ICD-10-CM | POA: Diagnosis not present

## 2015-05-10 DIAGNOSIS — E119 Type 2 diabetes mellitus without complications: Secondary | ICD-10-CM | POA: Diagnosis not present

## 2015-05-10 LAB — POC MICROSCOPIC URINALYSIS (UMFC): Mucus: ABSENT

## 2015-05-10 LAB — POCT URINALYSIS DIP (MANUAL ENTRY)
BILIRUBIN UA: NEGATIVE
NITRITE UA: POSITIVE — AB
PH UA: 5.5
Protein Ur, POC: NEGATIVE
Spec Grav, UA: 1.025
Urobilinogen, UA: 0.2

## 2015-05-10 MED ORDER — CIPROFLOXACIN HCL 250 MG PO TABS: 250.0000 mg | ORAL_TABLET | Freq: Two times a day (BID) | ORAL | Status: DC

## 2015-05-10 NOTE — Patient Instructions (Addendum)
IF you received an x-ray today, you will receive an invoice from [facility]. Please contact [facility] at [phone] with questions or concerns regarding your invoice.   IF you received labwork today, you will receive an invoice from [employer]. Please contact [facility] at [phone] with questions or concerns regarding your invoice.   Our billing staff will not be able to assist you with questions regarding bills from these companies.  You will be contacted with the lab results as soon as they are available. The fastest way to get your results is to activate your My Chart account. Instructions are located on the last page of this paperwork. If you have not heard from [place] regarding the results in 2 weeks, please contact this office.    Please hydrate well with 64 oz of water or more.  You can take tylenol or ibuprofen for the pain, and fever.   I will contact you with the results of your lab work.  Pyelonephritis, Adult Pyelonephritis is a kidney infection. The kidneys are the organs that filter a person's blood and move waste out of the bloodstream and into the urine. Urine passes from the kidneys, through the ureters, and into the bladder. There are two main types of pyelonephritis:  Infections that come on quickly without any warning (acute pyelonephritis).  Infections that last for a long period of time (chronic pyelonephritis). In most cases, the infection clears up with treatment and does not cause further problems. More severe infections or chronic infections can sometimes spread to the bloodstream or lead to other problems with the kidneys. CAUSES This condition is usually caused by:  Bacteria traveling from the bladder to the kidney through infected urine. The urine in the bladder can become infected with bacteria from:  Bladder infection (cystitis).  Inflammation of the prostate gland (prostatitis).  Sexual intercourse, in  females.  Bacteria traveling from the bloodstream to the kidney. RISK FACTORS This condition is more likely to develop in:  Pregnant women.  Older people.  People who have diabetes.  People who have kidney stones or bladder stones.  People who have other abnormalities of the kidney or ureter.  People who have a catheter placed in the bladder.  People who have cancer.  People who are sexually active.  Women who use spermicides.  People who have had a prior urinary tract infection. SYMPTOMS Symptoms of this condition include:  Frequent urination.  Strong or persistent urge to urinate.  Burning or stinging when urinating.  Abdominal pain.  Back pain.  Pain in the side or flank area.  Fever.  Chills.  Blood in the urine, or dark urine.  Nausea.  Vomiting. DIAGNOSIS This condition may be diagnosed based on:  Medical history and physical exam.  Urine tests.  Blood tests. You may also have imaging tests of the kidneys, such as an ultrasound or CT scan. TREATMENT Treatment for this condition may depend on the severity of the infection.  If the infection is mild and is found early, you may be treated with antibiotic medicines taken by mouth. You will need to drink fluids to remain hydrated.  If the infection is more severe, you may need to stay in the hospital and receive antibiotics given directly into a vein through an IV tube. You may also need to receive fluids through an IV tube if you are not able to remain hydrated. After your hospital stay, you may need to take oral antibiotics for a  period of time. Other treatments may be required, depending on the cause of the infection. HOME CARE INSTRUCTIONS Medicines  Take over-the-counter and prescription medicines only as told by your health care provider.  If you were prescribed an antibiotic medicine, take it as told by your health care provider. Do not stop taking the antibiotic even if you start to feel  better. General Instructions  Drink enough fluid to keep your urine clear or pale yellow.  Avoid caffeine, tea, and carbonated beverages. They tend to irritate the bladder.  Urinate often. Avoid holding in urine for long periods of time.  Urinate before and after sex.  After a bowel movement, women should cleanse from front to back. Use each tissue only once.  Keep all follow-up visits as told by your health care provider. This is important. SEEK MEDICAL CARE IF:  Your symptoms do not get better after 2 days of treatment.  Your symptoms get worse.  You have a fever. SEEK IMMEDIATE MEDICAL CARE IF:  You are unable to take your antibiotics or fluids.  You have shaking chills.  You vomit.  You have severe flank or back pain.  You have extreme weakness or fainting.   This information is not intended to replace advice given to you by your health care provider. Make sure you discuss any questions you have with your health care provider.   Document Released: 02/06/2005 Document Revised: 10/28/2014 Document Reviewed: 06/01/2014 Elsevier Interactive Patient Education [employer].
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
## 2015-05-10 NOTE — Telephone Encounter (Signed)
FYI Voicemail: "I have a kidney infection and need an antibiotic.  Call me [phone]." Called patient who reports started this weekend with pain and burning on urination.  It burns my hemorrhoids, my back and legs hurt.  I want to get this cleared up before my scans and F/U next month."    Advised she call PCP for these symptoms.  No further symptoms reported or questions.

## 2015-05-11 LAB — BASIC METABOLIC PANEL
BUN: 20 mg/dL (ref 7–25)
CALCIUM: 8.7 mg/dL (ref 8.6–10.4)
CO2: 23 mmol/L (ref 20–31)
Chloride: 105 mmol/L (ref 98–110)
Creat: 0.75 mg/dL (ref 0.50–0.99)
Glucose, Bld: 208 mg/dL — ABNORMAL HIGH (ref 65–99)
Potassium: 4.1 mmol/L (ref 3.5–5.3)
SODIUM: 140 mmol/L (ref 135–146)

## 2015-05-11 MED ORDER — MONTELUKAST SODIUM 10 MG PO TABS: 10.0000 mg | ORAL_TABLET | Freq: Every day | ORAL | Status: DC

## 2015-05-11 MED ORDER — METFORMIN HCL 500 MG PO TABS: 500.0000 mg | ORAL_TABLET | Freq: Two times a day (BID) | ORAL | Status: DC

## 2015-05-12 LAB — URINE CULTURE

## 2015-05-12 LAB — HEMOGLOBIN A1C
HEMOGLOBIN A1C: 7.7 % — AB (ref <5.7)
MEAN PLASMA GLUCOSE: 174 mg/dL — AB (ref <117)

## 2015-05-17 ENCOUNTER — Other Ambulatory Visit: Payer: Self-pay | Admitting: *Deleted

## 2015-05-17 ENCOUNTER — Telehealth: Payer: Self-pay | Admitting: Physician Assistant

## 2015-05-17 DIAGNOSIS — N10 Acute tubulo-interstitial nephritis: Secondary | ICD-10-CM

## 2015-05-17 DIAGNOSIS — E1165 Type 2 diabetes mellitus with hyperglycemia: Secondary | ICD-10-CM

## 2015-05-17 MED ORDER — METFORMIN HCL 500 MG PO TABS: 500.0000 mg | ORAL_TABLET | Freq: Two times a day (BID) | ORAL | Status: DC

## 2015-05-17 NOTE — Telephone Encounter (Signed)
Called and spoke with pt today about her lab results.  Pt voiced her understanding of these labs.    Pt stated that she is still not over the UTI that she had and feels that she needs another abx.  She stated that she is still having burning with urinating.  Stephanie please advise. Thanks

## 2015-05-17 NOTE — Telephone Encounter (Signed)
Pt called to check the status...  [phone]

## 2015-05-18 MED ORDER — CIPROFLOXACIN HCL 250 MG PO TABS: 250.0000 mg | ORAL_TABLET | Freq: Two times a day (BID) | ORAL | Status: DC

## 2015-05-18 NOTE — Telephone Encounter (Signed)
Left voicemail that we have refilled the cipro.  This was a lower alternative dose as not to insult her and an extended duration to a full 14 days can be appropriate.  Advised that this was at her pharmacy on [address].

## 2015-05-31 ENCOUNTER — Other Ambulatory Visit (HOSPITAL_BASED_OUTPATIENT_CLINIC_OR_DEPARTMENT_OTHER): Payer: Medicare Other

## 2015-05-31 ENCOUNTER — Ambulatory Visit (HOSPITAL_COMMUNITY)
Admission: RE | Admit: 2015-05-31 | Discharge: 2015-05-31 | Disposition: A | Discharge status: Home / Self Care | Payer: Medicare Other | Source: Ambulatory Visit | Attending: Hematology | Admitting: Hematology

## 2015-05-31 ENCOUNTER — Encounter (HOSPITAL_COMMUNITY): Payer: Self-pay

## 2015-05-31 DIAGNOSIS — C7A012 Malignant carcinoid tumor of the ileum: Secondary | ICD-10-CM

## 2015-05-31 DIAGNOSIS — Z9049 Acquired absence of other specified parts of digestive tract: Secondary | ICD-10-CM | POA: Insufficient documentation

## 2015-05-31 DIAGNOSIS — C7B8 Other secondary neuroendocrine tumors: Secondary | ICD-10-CM

## 2015-05-31 DIAGNOSIS — C189 Malignant neoplasm of colon, unspecified: Secondary | ICD-10-CM | POA: Diagnosis not present

## 2015-05-31 DIAGNOSIS — R918 Other nonspecific abnormal finding of lung field: Secondary | ICD-10-CM | POA: Insufficient documentation

## 2015-05-31 HISTORY — DX: Malignant (primary) neoplasm, unspecified: C80.1

## 2015-05-31 LAB — COMPREHENSIVE METABOLIC PANEL
ALBUMIN: 3.7 g/dL (ref 3.5–5.0)
ALK PHOS: 116 U/L (ref 40–150)
ALT: 18 U/L (ref 0–55)
AST: 15 U/L (ref 5–34)
Anion Gap: 7 mEq/L (ref 3–11)
BUN: 16.2 mg/dL (ref 7.0–26.0)
CO2: 28 mEq/L (ref 22–29)
Calcium: 9.4 mg/dL (ref 8.4–10.4)
Chloride: 106 mEq/L (ref 98–109)
Creatinine: 0.9 mg/dL (ref 0.6–1.1)
EGFR: 67 mL/min/{1.73_m2} — AB (ref >90)
GLUCOSE: 146 mg/dL — AB (ref 70–140)
POTASSIUM: 4.6 meq/L (ref 3.5–5.1)
SODIUM: 141 meq/L (ref 136–145)
Total Bilirubin: 0.46 mg/dL (ref 0.20–1.20)
Total Protein: 7.5 g/dL (ref 6.4–8.3)

## 2015-05-31 LAB — CBC & DIFF AND RETIC
BASO%: 0.3 % (ref 0.0–2.0)
Basophils Absolute: 0 10*3/uL (ref 0.0–0.1)
EOS%: 2.6 % (ref 0.0–7.0)
Eosinophils Absolute: 0.2 10*3/uL (ref 0.0–0.5)
HCT: 41.1 % (ref 34.8–46.6)
HGB: 13.5 g/dL (ref 11.6–15.9)
IMMATURE RETIC FRACT: 6 % (ref 1.60–10.00)
LYMPH#: 2.2 10*3/uL (ref 0.9–3.3)
LYMPH%: 27.9 % (ref 14.0–49.7)
MCH: 29.5 pg (ref 25.1–34.0)
MCHC: 32.8 g/dL (ref 31.5–36.0)
MCV: 89.7 fL (ref 79.5–101.0)
MONO#: 0.5 10*3/uL (ref 0.1–0.9)
MONO%: 6.6 % (ref 0.0–14.0)
NEUT%: 62.6 % (ref 38.4–76.8)
NEUTROS ABS: 4.9 10*3/uL (ref 1.5–6.5)
PLATELETS: 248 10*3/uL (ref 145–400)
RBC: 4.58 10*6/uL (ref 3.70–5.45)
RDW: 12.8 % (ref 11.2–14.5)
RETIC CT ABS: 62.29 10*3/uL (ref 33.70–90.70)
Retic %: 1.36 % (ref 0.70–2.10)
WBC: 7.8 10*3/uL (ref 3.9–10.3)

## 2015-05-31 IMAGING — CT CT ABD-PELV W/ CM
2 of 5 series · 17 of 46 positions shown, 19 images · IV contrast (OMNIPAQUE)
Comparison: [DATE].

CLINICAL DATA: Colon cancer.

EXAM:
CT ABDOMEN AND PELVIS WITH CONTRAST
TECHNIQUE: Multidetector CT imaging of the abdomen and pelvis was performed
using the standard protocol following bolus administration of
intravenous contrast.
CONTRAST:  100mL [8F] IOPAMIDOL ([8F]) INJECTION 61%

[Series 2: rtn a/p with · axial · 0.86mm/px · z∈[-460,-56]mm · 14 of 93 slices shown, 16 images]
[im 6/93  soft-tissue]
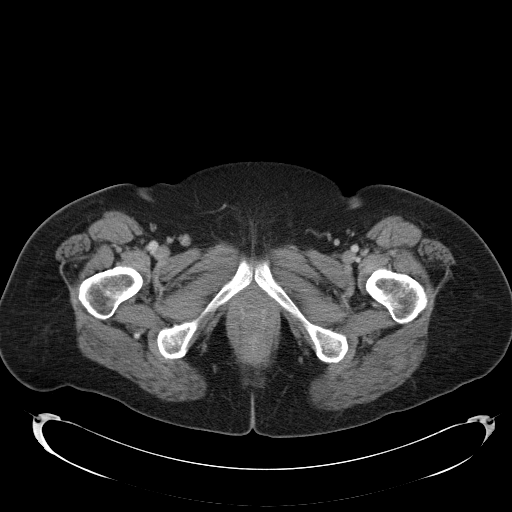
[im 6/93  bone]
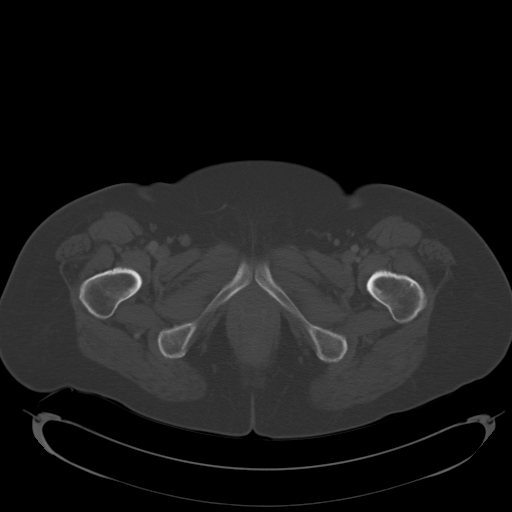
[im 12/93  soft-tissue]
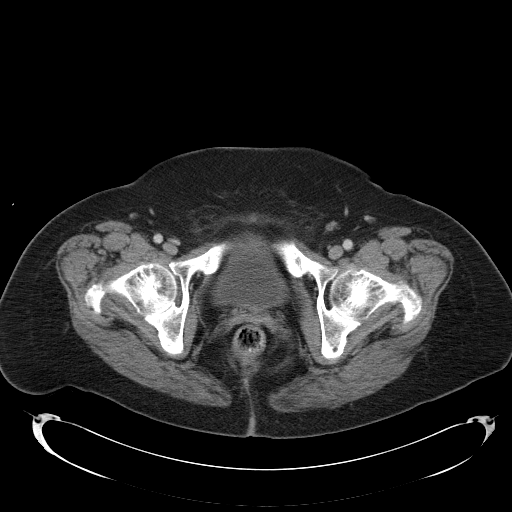
[im 18/93  soft-tissue]
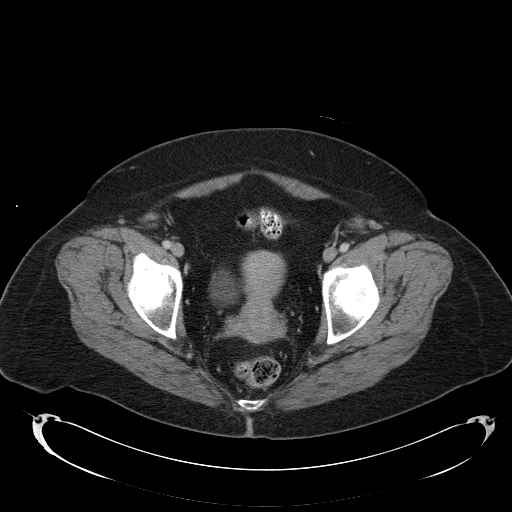
[im 24/93  soft-tissue]
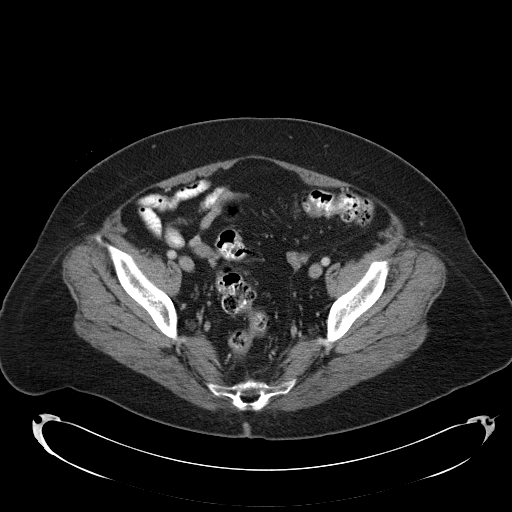
[im 29/93  soft-tissue]
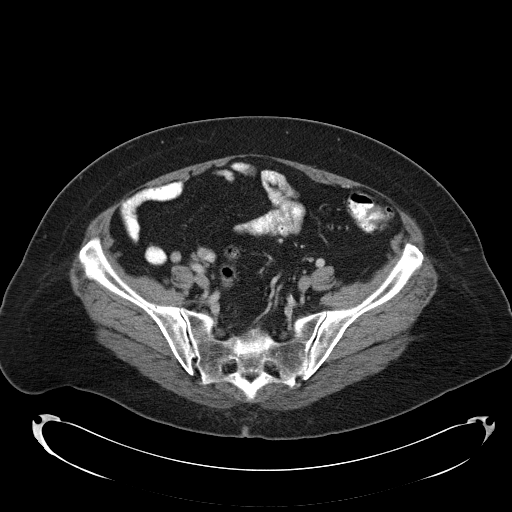
[im 35/93  soft-tissue]
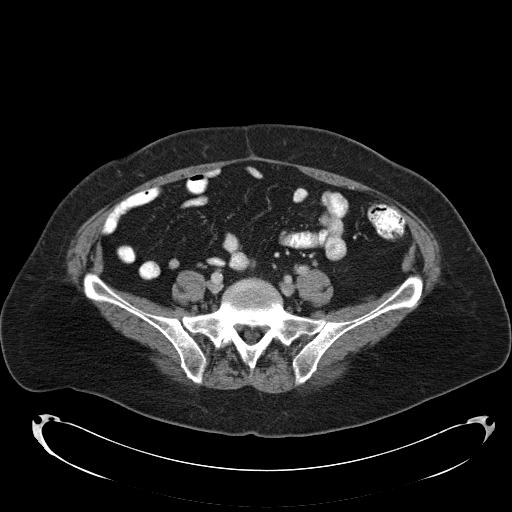
[im 41/93  soft-tissue]
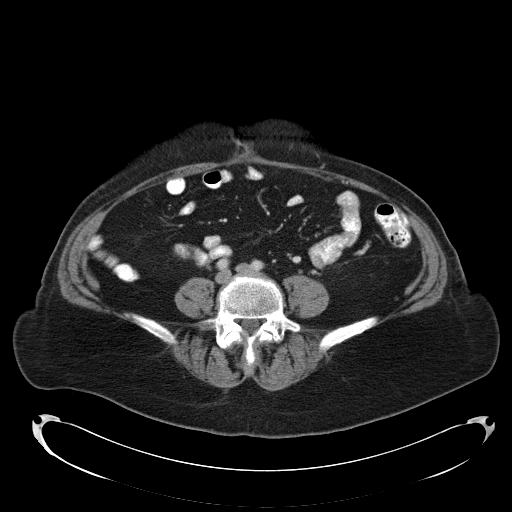
[im 52/93  soft-tissue]
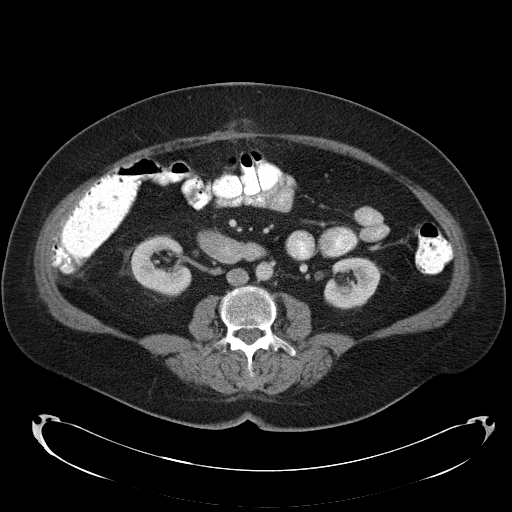
[im 58/93  soft-tissue]
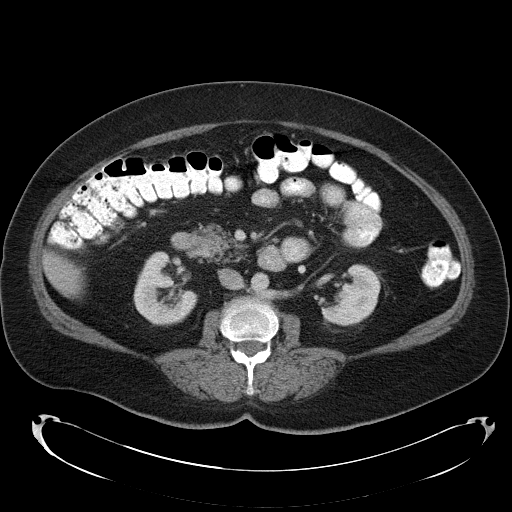
[im 58/93  bone]
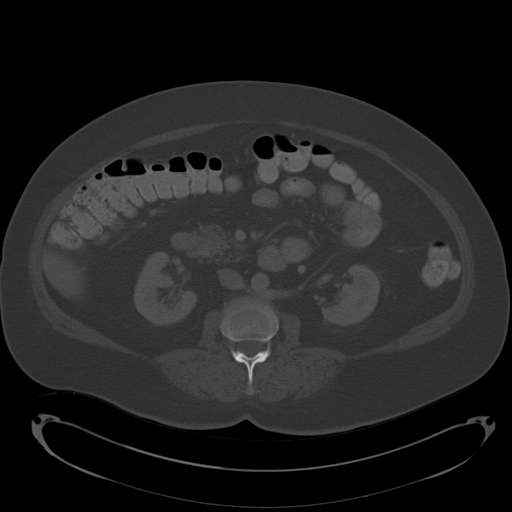
[im 64/93  soft-tissue]
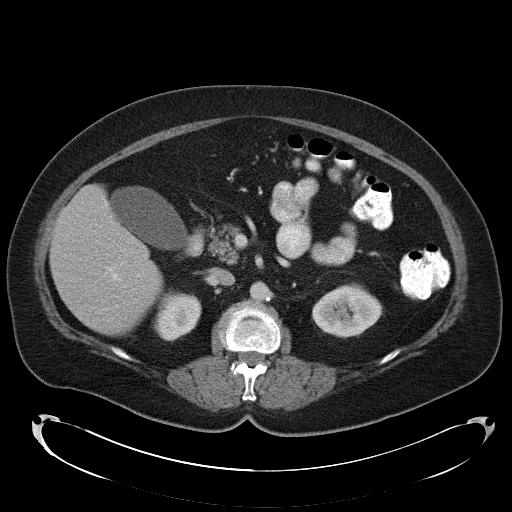
[im 70/93  soft-tissue]
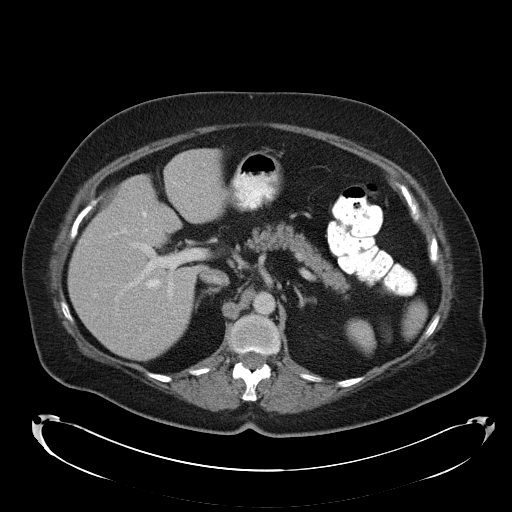
[im 75/93  soft-tissue]
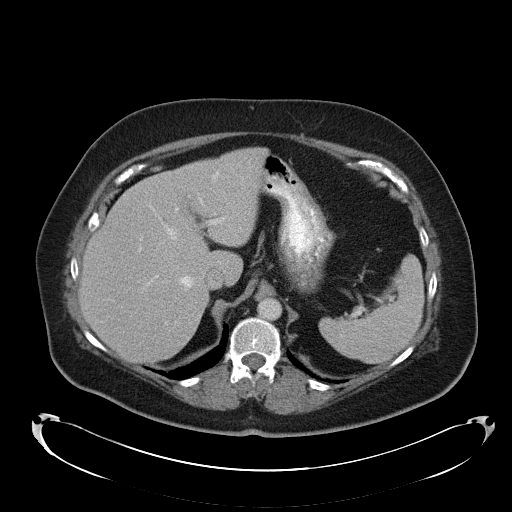
[im 81/93  soft-tissue]
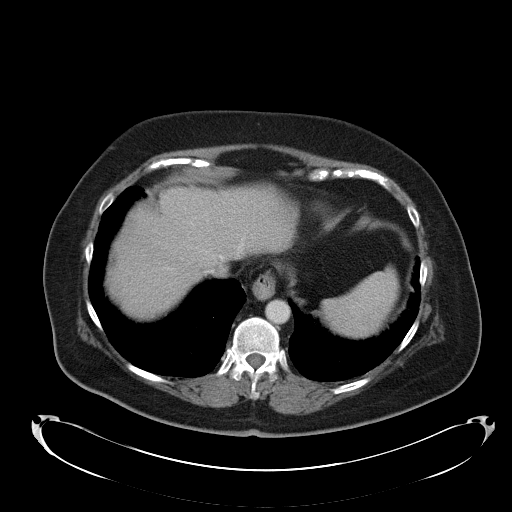
[im 87/93  soft-tissue]
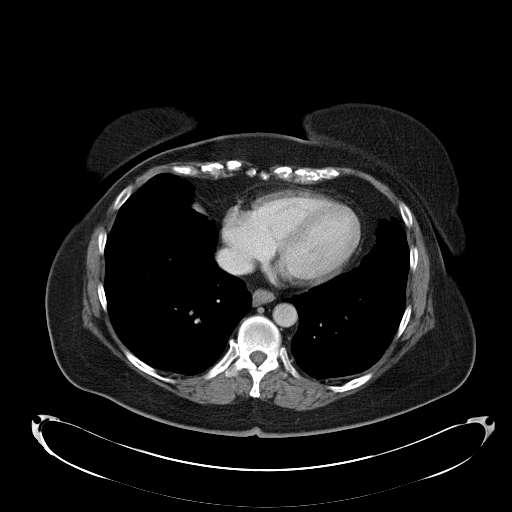

[Series 602: <mpr thick range> · coronal · 0.90mm/px · 3 of 149 slices shown]
[im 50/149  soft-tissue]
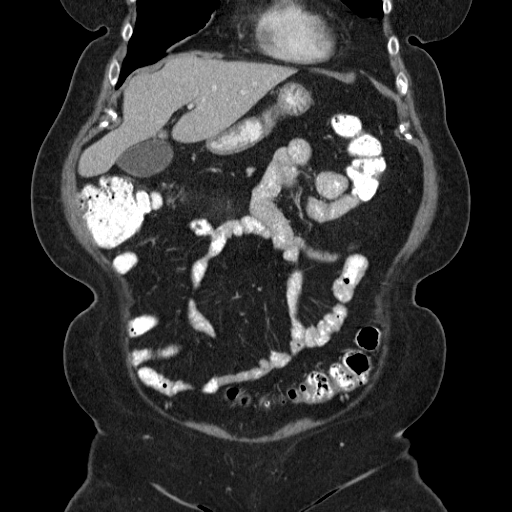
[im 66/149  soft-tissue]
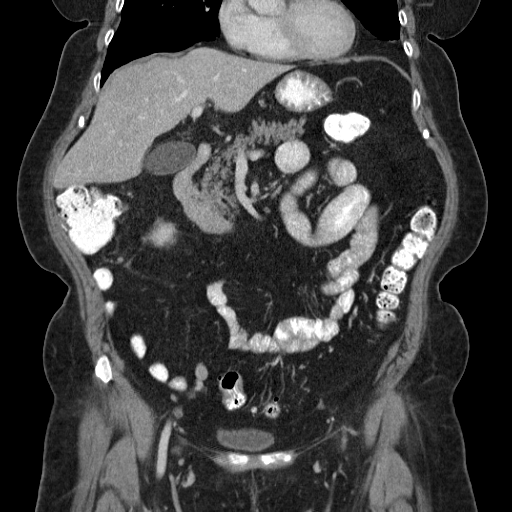
[im 83/149  soft-tissue]
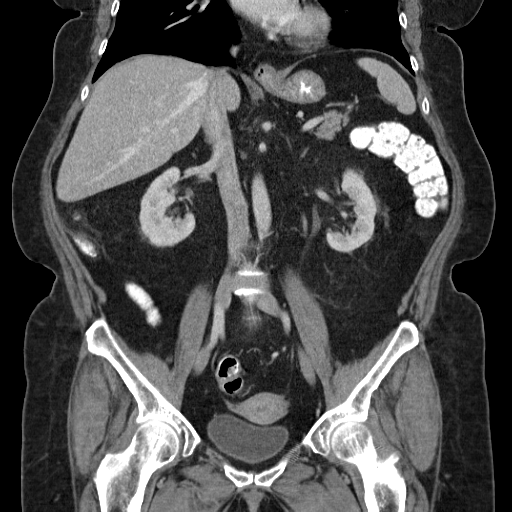

[17 of 46 positions shown; findings below may reference images not displayed]

FINDINGS: Lower chest: There may be mild subpleural fibrosis in the lung
bases. Heart size normal. No pericardial or pleural effusion.

Hepatobiliary: Liver and gallbladder are unremarkable. No biliary
ductal dilatation.

Pancreas: Negative.

Spleen: Negative.

Adrenals/Urinary Tract: Adrenal glands and kidneys are unremarkable.
Ureters are decompressed. Bladder is unremarkable.

Stomach/Bowel: Tiny hiatal hernia. Stomach is otherwise
unremarkable. Duodenal diverticulum. Small bowel is otherwise
unremarkable. Right hemicolectomy. Colon is otherwise unremarkable.

Vascular/Lymphatic: Scattered atherosclerotic calcification of the
arterial vasculature without abdominal aortic aneurysm. No
pathologically enlarged lymph nodes.

Reproductive: Uterus and ovaries are visualized.

Other: Midline ventral hernias contain fat. No free fluid.
Mesenteries and peritoneum are otherwise unremarkable.

Musculoskeletal: No worrisome lytic or sclerotic lesions.
IMPRESSION: 1. No evidence of metastatic disease.
2. Question mild basilar subpleural pulmonary fibrosis.

## 2015-05-31 MED ORDER — IOPAMIDOL (ISOVUE-300) INJECTION 61%
100.0000 mL | Freq: Once | INTRAVENOUS | Status: AC | PRN
  Administered 2015-05-31: 100 mL via INTRAVENOUS

## 2015-06-02 LAB — CHROMOGRANIN A: CHROMOGRAN A: 2 nmol/L (ref 0–5)

## 2015-06-04 LAB — CHROMOGRANIN A (PARALLEL TESTING): Chromogranin A: 11 ng/mL (ref <=15)

## 2015-06-07 ENCOUNTER — Encounter: Payer: Self-pay | Admitting: Hematology

## 2015-06-07 ENCOUNTER — Ambulatory Visit (HOSPITAL_BASED_OUTPATIENT_CLINIC_OR_DEPARTMENT_OTHER): Payer: Medicare Other | Admitting: Hematology

## 2015-06-07 ENCOUNTER — Telehealth: Payer: Self-pay | Admitting: Hematology

## 2015-06-07 VITALS — BP 134/71 | HR 84 | Temp 98.2°F | Resp 18 | Ht 65.0 in | Wt 191.2 lb

## 2015-06-07 DIAGNOSIS — C7A012 Malignant carcinoid tumor of the ileum: Secondary | ICD-10-CM

## 2015-06-07 DIAGNOSIS — E039 Hypothyroidism, unspecified: Secondary | ICD-10-CM | POA: Diagnosis not present

## 2015-06-07 DIAGNOSIS — I10 Essential (primary) hypertension: Secondary | ICD-10-CM

## 2015-06-07 DIAGNOSIS — R5383 Other fatigue: Secondary | ICD-10-CM | POA: Diagnosis not present

## 2015-06-07 DIAGNOSIS — E119 Type 2 diabetes mellitus without complications: Secondary | ICD-10-CM | POA: Diagnosis not present

## 2015-06-07 DIAGNOSIS — E1165 Type 2 diabetes mellitus with hyperglycemia: Secondary | ICD-10-CM

## 2015-06-07 DIAGNOSIS — C7B8 Other secondary neuroendocrine tumors: Secondary | ICD-10-CM

## 2015-06-07 MED ORDER — METFORMIN HCL 500 MG PO TABS: 500.0000 mg | ORAL_TABLET | Freq: Two times a day (BID) | ORAL | Status: DC

## 2015-06-07 MED ORDER — ALBUTEROL SULFATE 108 (90 BASE) MCG/ACT IN AEPB: 2.0000 | INHALATION_SPRAY | RESPIRATORY_TRACT | Status: DC | PRN

## 2015-06-07 MED ORDER — LEVOTHYROXINE SODIUM 100 MCG PO TABS
100.0000 ug | ORAL_TABLET | Freq: Every day | ORAL | Status: DC
Start: 2015-06-07 — End: 2015-08-23

## 2015-06-07 MED ORDER — MONTELUKAST SODIUM 10 MG PO TABS: 10.0000 mg | ORAL_TABLET | Freq: Every day | ORAL | Status: DC

## 2015-06-07 NOTE — Progress Notes (Signed)
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 negative margines.   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     HISTORY OF PRESENTING ILLNESS: Jessica Farrell is very nice 70 year old white female who was referred by GI Dr. Zella Richer for newly diagnosed small intestine carcinoid tumor.   She presented with 5-6 week history of mid and generalized abdominal discomfort with episodes of sharp intense abdominal pain, intermittent nausea and vomiting and diarrhea with loose stools generally postprandially. She was referred to GI Dr. Lillia Dallas who ordered a CT of the abdomen which was done on 01/21/2014. It shows a 2.6 x 3.4 cm soft tissue mass at the ileocecal valve there is mild adjacent adenopathy. There is also moderate dilation of the distal small bowel loops. Unclear whether this represents an adenocarcinoma or possibly a carcinoid. She underwent Colonoscopy 01/28/2014 and a circumferential mass was found within the  ileocecal valve and partially obstructing the lumen ,multiple biopsies were performed which came back negative for malignancy.   She presented to ED with abdominal pain, nausea and vomiting on 02/03/2014 and was admitted for SBO, and underwent Laparoscopic-assisted right colectomy and resection of distal ileum on 02/03/2014 by Dr. Zella Richer .   She has recovered well after surgery. The incision related pain is near resolved, still has small discharge form the ssurgical wound. She eats well, she has been having diarrhea since the surgery, improved, 3 loose BM daily now. She lost about 50 lbs in the past 2 years, which she contributes mainly to her diet control for DM.   CURRENT THERAPY: Observation   INTERIM HISTORY: Lucine returns for follow-up. She complains about fatigue lately. She has run out her medication lately, she wants to switch her primary care physician, Dr. has not scheduled yet. She denies any significant abdominal discomfort, nausea, or change of her bowel habits. Overall flushing, or other new symptoms.  MEDICAL HISTORY:  Past Medical History  Diagnosis Date  . Asthma   . Hyperlipidemia   . Diabetes mellitus ([place]) 05/05/2012    Type II  . Thyroid disease 1990's    HypoThyroidism  . Cellulitis March  2014    Left foot and ankle  . Morbid obesity ([place])   . Colon polyps     2015   . colon ca dx'd 01/2014    SURGICAL HISTORY: Past Surgical History  Procedure Laterality Date  . Fracture surgery  2000    Ankle  . Tooth extraction      wisdom Teeth  . Colon resection N/A 02/03/2014    Procedure: LAPAROSCOPIC ASSISTED BOWEL RESECTION;  Surgeon:  Jackolyn Confer, MD;  Location: [facility];  Service: General;  Laterality: N/A;    SOCIAL HISTORY: History   Social History  . Marital Status: Divorced    Spouse Name: N/A    Number of Children: 2  . Years of Education: N/A   Occupational History  . Secretary, still work part time    Social History Main Topics  .  Smoking status: Never Smoker   . Smokeless tobacco: Never Used  . Alcohol Use: No  . Drug Use: No  . Sexual Activity: Not Currently    Birth Control/ Protection: Post-menopausal   Other Topics Concern  . Not on file   Social History Narrative    FAMILY HISTORY: Family History  Problem Relation Age of Onset  . Alzheimer's disease Father   . Heart disease Mother   . Arthritis Mother   . Hypertension Mother   . Alzheimer's disease Sister   . Cancer Sister 13    breast cancer   . Heart disease Brother     Heart Disease before age 58  . Colon cancer Neg Hx   . Esophageal cancer Neg Hx   . Rectal cancer Neg Hx   . Stomach cancer Neg Hx     ALLERGIES:  is allergic to penicillins.  MEDICATIONS:  Current Outpatient Prescriptions  Medication Sig Dispense Refill  . atorvastatin (LIPITOR) 10 MG tablet Take 10 mg by mouth daily.    Marland Kitchen levothyroxine (SYNTHROID, LEVOTHROID) 100 MCG tablet Take 100 mcg by mouth daily before breakfast.    . metFORMIN (GLUCOPHAGE) 500 MG tablet Take 1 tablet (500 mg total) by mouth 2 (two) times daily with a meal. 60 tablet 1  . Albuterol Sulfate 108 (90 BASE) MCG/ACT AEPB Inhale 2 puffs into the lungs every 4 (four) hours as needed (wheezing/SOB). Reported on 06/07/2015    . levothyroxine (SYNTHROID, LEVOTHROID) 25 MCG tablet Take 1 tablet (25 mcg total) by mouth daily before breakfast. (Patient not taking: Reported on 05/10/2015) 30 tablet 0  . montelukast (SINGULAIR) 10 MG tablet Take 1 tablet (10 mg total) by mouth at bedtime. (Patient not taking: Reported on 06/07/2015) 30 tablet 1  . ondansetron (ZOFRAN) 4 MG tablet Take 1 tab every 6 hours as needed for nausea. (Patient not taking: Reported on 03/02/2014) 30 tablet 0   No current facility-administered medications for this visit.    REVIEW OF SYSTEMS:   Constitutional: Denies fevers, chills or abnormal night sweats Eyes: Denies blurriness of vision, double vision or watery eyes Ears, nose, mouth,  throat, and face: Denies mucositis or sore throat Respiratory: Denies cough, dyspnea or wheezes Cardiovascular: Denies palpitation, chest discomfort or lower extremity swelling Gastrointestinal:  Denies nausea, heartburn or change in bowel habits Skin: Denies abnormal skin rashes Lymphatics: Denies new lymphadenopathy or easy bruising Neurological:Denies numbness, tingling or new weaknesses Behavioral/Psych: Mood is stable, no new changes  All other systems were reviewed with the patient and are negative.  PHYSICAL EXAMINATION: ECOG PERFORMANCE STATUS: 0 - Asymptomatic  Filed Vitals:   06/07/15 1234  BP: 134/71  Pulse: 84  Temp: 98.2 F (36.8 C)  Resp: 18   Filed Weights   06/07/15 1234  Weight: 191 lb 3.2 oz (86.728 kg)    GENERAL:alert, no distress and comfortable SKIN: skin color, texture, turgor are normal, no rashes or significant lesions EYES: normal, conjunctiva are pink and non-injected, sclera clear OROPHARYNX:no exudate, no erythema and lips, buccal mucosa, and tongue normal  NECK: supple, thyroid normal size, non-tender, without  nodularity LYMPH:  no palpable lymphadenopathy in the cervical, axillary or inguinal LUNGS: clear to auscultation and percussion with normal breathing effort HEART: regular rate & rhythm and no murmurs and no lower extremity edema ABDOMEN:abdomen soft, non-tender and normal bowel sounds Musculoskeletal:no cyanosis of digits and no clubbing  PSYCH: alert & oriented x 3 with fluent speech NEURO: no focal motor/sensory deficits  LABORATORY DATA:  I have reviewed the data as listed Lab Results  Component Value Date   WBC 7.8 05/31/2015   HGB 13.5 05/31/2015   HCT 41.1 05/31/2015   MCV 89.7 05/31/2015   PLT 248 05/31/2015    Recent Labs  11/23/14 1152 05/10/15 1728 05/31/15 1245  NA 142 140 141  K 4.2 4.1 4.6  CL  --  105  --   CO2 25 23 28   GLUCOSE 164* 208* 146*  BUN 14.6 20 16.2  CREATININE 0.8 0.75 0.9  CALCIUM 9.0 8.7  9.4  PROT 7.0  --  7.5  ALBUMIN 3.5  --  3.7  AST 20  --  15  ALT 31  --  18  ALKPHOS 137  --  116  BILITOT 0.38  --  0.46   Chromogranin A  Status: Finalresult Visible to patient:  Not Released Nextappt: Today at 12:45 PM in Oncology Burr Medico, Krista Blue, MD) Dx:  Metastatic malignant neuroendocrine t...           Ref Range 7d ago  76mo ago  68mo ago     Chromogranin A <=15 ng/mL 7 7CM <5R, CM          PATHOLOGY REPORT  Small intestine, resection for tumor, ascending 02/03/2014 - INVASIVE WELL DIFFERENTIATED NEUROENDOCRINE TUMOR (CARCINOID), SPANNING 3 CM IN GREATEST DIMENSION. - TUMOR INVADES MUSCULARIS PROPRIA TO INVOLVE SUBSEROSAL SOFT TISSUES. - MARGINS ARE NEGATIVE. - FIVE OF TWENTY-FIVE LYMPH NODES ARE POSITIVE FOR METASTATIC LOW GRADE NEUROENDOCRINE TUMOR (5/25). - EXTRACAPSULAR EXTENSION IS IDENTIFIED. - SEE ONCOLOGY TEMPLATE. Specimen: Portion of terminal ileum with attached proximal portion of right colon. Procedure: Laparoscopic assisted right colectomy and resection of distal ileum. Tumor Site: Distal terminal ileum, 3 cm proximal to ileocecal valve. Tumor Size: 3 cm in greatest dimension. Tumor Focality: Unifocal. Histologic Type: Well differentiated neuroendocrine tumor (carcinoid tumor) . Histologic Grade : G1: Low grade. Mitotic Rate low mitotic rate, less than 1/10 mitoses per hpf (high power field). Microscopic Tumor Extension: Tumor invades through muscularis propria to involve subserosal soft tissues, Margins: Proximal Margin: Negative. Distal Margin: Negative. Mesenteric (Radial) Margin : Negative. All margins uninvolved by neuroendocrine tumor: Distance from closest margin: 4.5 cm Margin: Mesenteric margin. Lymph-Vascular Invasion: Tumor is seen involving a subcapsular sinus space (lymphatic space) in one of the lymph nodes. Perineural Invasion: Not identified. Lymph nodes: number examined - 25; number positive: 5. TNM Staging: pT3,  pN1.   RADIOGRAPHIC STUDIES: I have personally reviewed the radiological images as listed and agreed with the findings in the report.  CT abdomen and pelvis 05/31/2015 IMPRESSION: 1. No evidence of metastatic disease. 2. Question mild basilar subpleural pulmonary fibrosis.   ASSESSMENT & PLAN:  70 year old female with newly diagnosed carcinoid tumor at the terminal ileum near the ileumcecal valve.  1. 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 Surgical margins were negative. -I discussed her diagnosis and the staging with her in great details.  -There is no role of adjuvant chemotherapy for resected carcinoid tumor.  -We discussed  the risks of tumor recurrence in the future, including late recurrence. -Her postop chromogranin A and urine 5 HIAA level were normal. Those were not checked before surgery. -I recommend a total of 10 years surveillance, with office visit, lab every 6 months for the first 2 years, then every 6-12 months afterwards. CT scans can be considered for surveillance, per [place] and guideline. -I discussed her restaging CT scan on 05/31/2015, which showed no evidence of recurrence. -Lab results reviewed with her, CBC, CMP and chromogranin A levels are normal.  2. HTN, DM, hypothyroidism -She wants a new primary care physician, but has not called for appointment yet. I strongly encouraged her to call as soon as possible -I refilled her metformin, Synthroid, singular, and told her she should get this medication refill by her new primary care physician.  Plan: Return to clinic in 6 months with lab one week before   All questions were answered. The patient knows to call the clinic with any problems, questions or concerns. I spent 20 minutes counseling the patient face to face. The total time spent in the appointment was 30 minutes and more than 50% was on  counseling.     Truitt Merle, MD 06/07/2015 12:52 PM

## 2015-06-07 NOTE — Telephone Encounter (Signed)
Gave and printed appt sched and avs fo rpt for OCT °

## 2015-06-09 LAB — 5 HIAA, QUANTITATIVE, URINE, 24 HOUR
5-HIAA, URINE: 4.6 mg/L
5-HIAA,QUANT.,24 HR URINE: 5.3 mg/(24.h) (ref 0.0–14.9)

## 2015-07-20 ENCOUNTER — Telehealth: Payer: Self-pay | Admitting: *Deleted

## 2015-07-20 MED ORDER — ATORVASTATIN CALCIUM 10 MG PO TABS: 10.0000 mg | ORAL_TABLET | Freq: Every day | ORAL | Status: DC

## 2015-07-20 NOTE — Telephone Encounter (Signed)
"  My Pharmacy did not receive refill for Atorvastin from Dr, Burr Medico.  I no longer see Dr. Osborne Casco.  I will not see new PCP Dr. Celso Amy until 09-15-2015.  If she could order enough to get me through to August 1, this would be great.I use the [facility] on [address]  If any questions call [phone]."

## 2015-07-20 NOTE — Telephone Encounter (Signed)
Myrtle,  Could you call in for her?Thanks  Truitt Merle

## 2015-07-20 NOTE — Telephone Encounter (Signed)
Future refills from PCP.  Today, refill sent eRx for a two month supply until can be established with new PCP.  Called patient notifying her of this order.

## 2015-08-09 ENCOUNTER — Other Ambulatory Visit: Payer: Self-pay | Admitting: Hematology

## 2015-08-13 ENCOUNTER — Other Ambulatory Visit: Payer: Self-pay | Admitting: Hematology

## 2015-08-16 ENCOUNTER — Other Ambulatory Visit: Payer: Self-pay | Admitting: Hematology

## 2015-08-23 ENCOUNTER — Other Ambulatory Visit: Payer: Self-pay | Admitting: *Deleted

## 2015-08-23 DIAGNOSIS — C7B8 Other secondary neuroendocrine tumors: Secondary | ICD-10-CM

## 2015-08-23 MED ORDER — LEVOTHYROXINE SODIUM 100 MCG PO TABS: 100.0000 ug | ORAL_TABLET | Freq: Every day | ORAL | Status: DC

## 2015-09-15 ENCOUNTER — Encounter: Payer: Self-pay | Admitting: Internal Medicine

## 2015-09-15 ENCOUNTER — Ambulatory Visit (INDEPENDENT_AMBULATORY_CARE_PROVIDER_SITE_OTHER): Payer: Medicare Other | Admitting: Internal Medicine

## 2015-09-15 ENCOUNTER — Other Ambulatory Visit (INDEPENDENT_AMBULATORY_CARE_PROVIDER_SITE_OTHER): Payer: Medicare Other

## 2015-09-15 VITALS — BP 130/86 | HR 92 | Temp 98.0°F | Resp 16 | Wt 190.0 lb

## 2015-09-15 DIAGNOSIS — E1165 Type 2 diabetes mellitus with hyperglycemia: Secondary | ICD-10-CM | POA: Diagnosis not present

## 2015-09-15 DIAGNOSIS — E038 Other specified hypothyroidism: Secondary | ICD-10-CM

## 2015-09-15 DIAGNOSIS — E785 Hyperlipidemia, unspecified: Secondary | ICD-10-CM

## 2015-09-15 DIAGNOSIS — J4520 Mild intermittent asthma, uncomplicated: Secondary | ICD-10-CM | POA: Diagnosis not present

## 2015-09-15 DIAGNOSIS — Z23 Encounter for immunization: Secondary | ICD-10-CM

## 2015-09-15 DIAGNOSIS — I872 Venous insufficiency (chronic) (peripheral): Secondary | ICD-10-CM

## 2015-09-15 DIAGNOSIS — C7B8 Other secondary neuroendocrine tumors: Secondary | ICD-10-CM

## 2015-09-15 LAB — COMPREHENSIVE METABOLIC PANEL
ALBUMIN: 3.8 g/dL (ref 3.5–5.2)
ALT: 16 U/L (ref 0–35)
AST: 14 U/L (ref 0–37)
Alkaline Phosphatase: 134 U/L — ABNORMAL HIGH (ref 39–117)
BUN: 9 mg/dL (ref 6–23)
CHLORIDE: 106 meq/L (ref 96–112)
CO2: 25 mEq/L (ref 19–32)
Calcium: 9.1 mg/dL (ref 8.4–10.5)
Creatinine, Ser: 0.76 mg/dL (ref 0.40–1.20)
GFR: 79.89 mL/min (ref >60.00)
Glucose, Bld: 137 mg/dL — ABNORMAL HIGH (ref 70–99)
POTASSIUM: 4.2 meq/L (ref 3.5–5.1)
SODIUM: 140 meq/L (ref 135–145)
Total Bilirubin: 0.5 mg/dL (ref 0.2–1.2)
Total Protein: 7.3 g/dL (ref 6.0–8.3)

## 2015-09-15 LAB — LIPID PANEL
CHOLESTEROL: 143 mg/dL (ref 0–200)
HDL: 35.1 mg/dL — ABNORMAL LOW (ref >39.00)
LDL Cholesterol: 81 mg/dL (ref 0–99)
NonHDL: 107.5
TRIGLYCERIDES: 135 mg/dL (ref 0.0–149.0)
Total CHOL/HDL Ratio: 4
VLDL: 27 mg/dL (ref 0.0–40.0)

## 2015-09-15 LAB — MICROALBUMIN / CREATININE URINE RATIO
Creatinine,U: 89.7 mg/dL
MICROALB/CREAT RATIO: 0.8 mg/g (ref 0.0–30.0)
Microalb, Ur: <0.7 mg/dL (ref 0.0–1.9)

## 2015-09-15 LAB — HEMOGLOBIN A1C: HEMOGLOBIN A1C: 7.7 % — AB (ref 4.6–6.5)

## 2015-09-15 LAB — TSH: TSH: 9.76 u[IU]/mL — AB (ref 0.35–4.50)

## 2015-09-15 NOTE — Assessment & Plan Note (Signed)
Check tsh  Titrate med dose if needed  

## 2015-09-15 NOTE — Progress Notes (Signed)
Subjective:    Patient ID: Jessica Farrell, female    DOB: 08-09-1945, 70 y.o.   MRN: [id number]  HPI She is here to establish with a new pcp.    Diabetes: She is taking her medication daily as prescribed. She is compliant with a diabetic diet. She is not exercising regularly. She monitors her sugars on occasion and they have been running 170 yesterday. She checks her feet daily and denies foot lesions. She is not up-to-date with an ophthalmology examination.   Hyperlipidemia: She is taking her medication daily. She is compliant with a low fat/cholesterol diet. She is not exercising regularly. She denies myalgias.   Hypothyroidism:  She is taking her medication daily.  She denies any recent changes in energy or weight that are unexplained.   Asthma: She feels food and allergies flare her asthma.  She takes singulair daily and her albuterol inhaler as needed.  She is not taking an antihistamine on a daily basis.  Small intestine carcinoid tumor:  S/p surgery 01/2014 - three feet removed of small bowel.   She follows with oncology.  She has had diarrhea since she has had her surgery.  She typically has it 3-4 times a day.  She denies blood in the stool.  She denies any abdominal pain.  Medications and allergies reviewed with patient and updated if appropriate.  Patient Active Problem List   Diagnosis Date Noted  . 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 SBO (small bowel obstruction) ([place]) 01/30/2014  . Chronic venous insufficiency 08/02/2012  . HLD (hyperlipidemia) 05/15/2012  . Diabetes mellitus ([place]) 05/05/2012  . Hypothyroidism 05/05/2012  . Asthma 05/05/2012    Current Outpatient Prescriptions on File Prior to Visit  Medication Sig Dispense Refill  . Albuterol Sulfate 108 (90 Base) MCG/ACT AEPB Inhale 2 puffs into the lungs every 4 (four) hours as needed (wheezing/SOB). Reported on 06/07/2015 1 each 0  . levothyroxine (SYNTHROID, LEVOTHROID)  100 MCG tablet Take 1 tablet (100 mcg total) by mouth daily before breakfast. 30 tablet 0  . metFORMIN (GLUCOPHAGE) 500 MG tablet Take 1 tablet (500 mg total) by mouth 2 (two) times daily with a meal. 60 tablet 1  . montelukast (SINGULAIR) 10 MG tablet Take 1 tablet (10 mg total) by mouth at bedtime. 30 tablet 1  . atorvastatin (LIPITOR) 10 MG tablet Take 1 tablet (10 mg total) by mouth daily. 30 tablet 1   No current facility-administered medications on file prior to visit.     Past Medical History:  Diagnosis Date  . Asthma   . Cellulitis March  2014   Left foot and ankle  . colon ca dx'd 01/2014  . Colon polyps    2015   . Diabetes mellitus ([place]) 05/05/2012   Type II  . Hyperlipidemia   . Morbid obesity ([place])   . Thyroid disease 1990's   HypoThyroidism    Past Surgical History:  Procedure Laterality Date  . COLON RESECTION N/A 02/03/2014   Procedure: LAPAROSCOPIC ASSISTED BOWEL RESECTION;  Surgeon: Jackolyn Confer, MD;  Location: [facility];  Service: General;  Laterality: N/A;  . FRACTURE SURGERY  2000   Ankle  . TOOTH EXTRACTION     wisdom Teeth    Social History   Social History  . Marital status: Divorced    Spouse name: N/A  . Number of children: N/A  . Years of education: N/A   Social History Main Topics  . Smoking status: Never Smoker  .  Smokeless tobacco: Never Used  . Alcohol use No  . Drug use: No  . Sexual activity: Not Currently    Birth control/ protection: Post-menopausal   Other Topics Concern  . None   Social History Narrative  . None    Family History  Problem Relation Age of Onset  . Alzheimer's disease Father   . Heart disease Mother   . Arthritis Mother   . Hypertension Mother   . Alzheimer's disease Sister   . Cancer Sister 74    breast cancer   . Heart disease Brother     Heart Disease before age 3  . Colon cancer Neg Hx   . Esophageal cancer Neg Hx   . Rectal cancer Neg Hx   . Stomach cancer Neg Hx     Review of Systems    Constitutional: Negative for appetite change, chills and fever.  HENT: Positive for congestion and postnasal drip.   Eyes: Negative for visual disturbance.  Respiratory: Positive for cough, shortness of breath (with activity sometimes) and wheezing.   Cardiovascular: Positive for leg swelling. Negative for chest pain and palpitations.  Gastrointestinal: Positive for diarrhea (3-4 times a day after surgery). Negative for abdominal pain, blood in stool and nausea.       No gerd  Genitourinary: Negative for dysuria and hematuria.  Musculoskeletal: Negative for arthralgias and back pain.  Neurological: Positive for headaches (when sugar is elevated only). Negative for dizziness and light-headedness.  Psychiatric/Behavioral: Negative for dysphoric mood. The patient is not nervous/anxious.        Objective:   Vitals:   09/15/15 1003  BP: 130/86  Pulse: 92  Resp: 16  Temp: 98 F (36.7 C)   Filed Weights   09/15/15 1003  Weight: 190 lb (86.2 kg)   Body mass index is 31.62 kg/m.   Physical Exam Constitutional: She appears well-developed and well-nourished. No distress.  HENT:  Head: Normocephalic and atraumatic.  Right Ear: External ear normal. Normal ear canal and TM Left Ear: External ear normal.  Normal ear canal and TM Mouth/Throat: Oropharynx is clear and moist.  Eyes: Conjunctivae normal.  Neck: Neck supple. No tracheal deviation present. No thyromegaly present.  No carotid bruit  Cardiovascular: Normal rate, regular rhythm and normal heart sounds.   No murmur heard.  No edema. Pulmonary/Chest: Effort normal and breath sounds normal. No respiratory distress. She has no wheezes. She has no rales.  Abdominal: Soft. She exhibits no distension. There is no tenderness.  Lymphadenopathy: She has no cervical adenopathy.  Skin: Skin is warm and dry. She is not diaphoretic.  Psychiatric: She has a normal mood and affect. Her behavior is normal.         Assessment & Plan:    See Problem List for Assessment and Plan of chronic medical problems.  F/u in 6 months

## 2015-09-15 NOTE — Assessment & Plan Note (Addendum)
Takes singulair daily Uses albuterol prn - does not use it often Her allergies do not seem controlled-advised taking Zyrtec nightly in addition to the Singulair

## 2015-09-15 NOTE — Assessment & Plan Note (Signed)
Sugar slightly elevated at home Last A1c 7.7 Check A1c, urine microalbumin Stressed the importance of regular exercise Ideally work on weight loss-discussed decreasing portions

## 2015-09-15 NOTE — Patient Instructions (Addendum)
  Test(s) ordered today. Your results will be released to [place] (or called to you) after review, usually within 72hours after test completion. If any changes need to be made, you will be notified at that same time.  All other Health Maintenance issues reviewed.   All recommended immunizations and age-appropriate screenings are up-to-date or discussed.  prevnar vaccine administered today.   Medications reviewed and updated.  No changes recommended at this time.  Your prescription(s) have been submitted to your pharmacy. Please take as directed and contact our office if you believe you are having problem(s) with the medication(s).   Please followup in 6 months

## 2015-09-15 NOTE — Assessment & Plan Note (Signed)
Taking Lipitor 10 mg daily Check lipid panel-she is not completely fasting, ate toast

## 2015-09-15 NOTE — Assessment & Plan Note (Signed)
Following with oncology 

## 2015-09-15 NOTE — Assessment & Plan Note (Signed)
Chronic LLE > RLE, had cellulitis in the LLE Low sodium diet

## 2015-09-15 NOTE — Progress Notes (Signed)
Pre visit review using our clinic review tool, if applicable. No additional management support is needed unless otherwise documented below in the visit note. 

## 2015-09-17 ENCOUNTER — Other Ambulatory Visit: Payer: Self-pay | Admitting: Emergency Medicine

## 2015-09-17 MED ORDER — LEVOTHYROXINE SODIUM 112 MCG PO TABS: 112.0000 ug | ORAL_TABLET | Freq: Every day | ORAL | 0 refills | Status: DC

## 2015-09-23 ENCOUNTER — Other Ambulatory Visit: Payer: Self-pay | Admitting: *Deleted

## 2015-09-23 DIAGNOSIS — E1165 Type 2 diabetes mellitus with hyperglycemia: Secondary | ICD-10-CM

## 2015-09-23 MED ORDER — ATORVASTATIN CALCIUM 10 MG PO TABS: 10.0000 mg | ORAL_TABLET | Freq: Every day | ORAL | 1 refills | Status: DC

## 2015-09-23 MED ORDER — METFORMIN HCL 500 MG PO TABS: 500.0000 mg | ORAL_TABLET | Freq: Two times a day (BID) | ORAL | 1 refills | Status: DC

## 2015-09-23 NOTE — Telephone Encounter (Signed)
Left msg on triage stating [facility]itor & Metformin. Called pt back no answer LMOM refills has been sent...Johny Chess

## 2015-12-06 ENCOUNTER — Other Ambulatory Visit: Payer: Medicare Other

## 2015-12-13 ENCOUNTER — Ambulatory Visit: Payer: Medicare Other | Admitting: Hematology

## 2015-12-13 NOTE — Progress Notes (Deleted)
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 negative margines.      02/03/2014 Surgery    Laparoscopic-assisted right colectomy and resection of distal ileum      02/03/2014 Initial Diagnosis    Metastatic malignant neuroendocrine tumor of the ileocecal valve to regional lymph nodes.        HISTORY OF PRESENTING ILLNESS: Jessica Farrell is very nice 70 year old white female who was referred by GI Dr. Zella Richer for newly diagnosed small intestine carcinoid tumor.   She presented with 5-6 week history of mid and generalized abdominal discomfort with episodes of sharp intense abdominal pain, intermittent nausea and vomiting and diarrhea with loose stools generally postprandially. She was referred to GI Dr. Lillia Dallas who ordered a CT of the abdomen which was done on 01/21/2014. It shows a 2.6 x 3.4 cm soft tissue mass at the ileocecal valve there is mild adjacent adenopathy. There is also moderate dilation of the distal small bowel loops. Unclear whether this represents an adenocarcinoma or possibly a carcinoid. She underwent Colonoscopy 01/28/2014 and a  circumferential mass was found within the ileocecal valve and partially obstructing the lumen ,multiple biopsies were performed which came back negative for malignancy.   She presented to ED with abdominal pain, nausea and vomiting on 02/03/2014 and was admitted for SBO, and underwent Laparoscopic-assisted right colectomy and resection of distal ileum on 02/03/2014 by Dr. Zella Richer .   She has recovered well after surgery. The incision related pain is near resolved, still has small discharge form the ssurgical wound. She eats well, she has been having diarrhea since the surgery, improved, 3 loose BM daily now. She lost about 50 lbs in the past 2 years, which she contributes mainly to her diet control for DM.   CURRENT THERAPY: Observation   INTERIM HISTORY: Clareen returns for follow-up. She complains about fatigue lately. She has run out her medication lately, she wants to switch her primary care physician, Dr. has not scheduled yet. She denies any significant abdominal discomfort, nausea, or change of her bowel habits. Overall flushing, or other new symptoms.  MEDICAL HISTORY:  Past Medical History:  Diagnosis Date  . Asthma   . Cellulitis March  2014   Left foot and ankle  . colon ca dx'd 01/2014  . Colon polyps    2015   . Diabetes mellitus ([place]) 05/05/2012   Type II  . Hyperlipidemia   . Morbid obesity ([place])   . Thyroid disease 1990's   HypoThyroidism    SURGICAL HISTORY: Past Surgical History:  Procedure Laterality Date  . COLON RESECTION N/A 02/03/2014   Procedure: LAPAROSCOPIC ASSISTED BOWEL RESECTION;  Surgeon: Jackolyn Confer,  MD;  Location: [facility];  Service: General;  Laterality: N/A;  . FRACTURE SURGERY  2000   Ankle  . TOOTH EXTRACTION     wisdom Teeth    SOCIAL HISTORY: History   Social History  . Marital Status: Divorced    Spouse Name: N/A    Number of Children: 2  . Years of Education: N/A   Occupational History  . Secretary, still work part time     Social History Main Topics  . Smoking status: Never Smoker   . Smokeless tobacco: Never Used  . Alcohol Use: No  . Drug Use: No  . Sexual Activity: Not Currently    Birth Control/ Protection: Post-menopausal   Other Topics Concern  . Not on file   Social History Narrative    FAMILY HISTORY: Family History  Problem Relation Age of Onset  . Alzheimer's disease Father   . Heart disease Mother   . Arthritis Mother   . Hypertension Mother   . Alzheimer's disease Sister   . Cancer Sister 16    breast cancer   . Heart disease Brother     Heart Disease before age 32  . Colon cancer Neg Hx   . Esophageal cancer Neg Hx   . Rectal cancer Neg Hx   . Stomach cancer Neg Hx     ALLERGIES:  is allergic to penicillins.  MEDICATIONS:  Current Outpatient Prescriptions  Medication Sig Dispense Refill  . Albuterol Sulfate 108 (90 Base) MCG/ACT AEPB Inhale 2 puffs into the lungs every 4 (four) hours as needed (wheezing/SOB). Reported on 06/07/2015 1 each 0  . atorvastatin (LIPITOR) 10 MG tablet Take 1 tablet (10 mg total) by mouth daily. 90 tablet 1  . levothyroxine (SYNTHROID, LEVOTHROID) 112 MCG tablet Take 1 tablet (112 mcg total) by mouth daily. 90 tablet 0  . metFORMIN (GLUCOPHAGE) 500 MG tablet Take 1 tablet (500 mg total) by mouth 2 (two) times daily with a meal. 180 tablet 1  . montelukast (SINGULAIR) 10 MG tablet Take 1 tablet (10 mg total) by mouth at bedtime. 30 tablet 1   No current facility-administered medications for this visit.     REVIEW OF SYSTEMS:   Constitutional: Denies fevers, chills or abnormal night sweats Eyes: Denies blurriness of vision, double vision or watery eyes Ears, nose, mouth, throat, and face: Denies mucositis or sore throat Respiratory: Denies cough, dyspnea or wheezes Cardiovascular: Denies palpitation, chest discomfort or lower extremity swelling Gastrointestinal:  Denies nausea, heartburn or change in bowel habits Skin: Denies abnormal skin  rashes Lymphatics: Denies new lymphadenopathy or easy bruising Neurological:Denies numbness, tingling or new weaknesses Behavioral/Psych: Mood is stable, no new changes  All other systems were reviewed with the patient and are negative.  PHYSICAL EXAMINATION: ECOG PERFORMANCE STATUS: 0 - Asymptomatic  There were no vitals filed for this visit. There were no vitals filed for this visit.  GENERAL:alert, no distress and comfortable SKIN: skin color, texture, turgor are normal, no rashes or significant lesions EYES: normal, conjunctiva are pink and non-injected, sclera clear OROPHARYNX:no exudate, no erythema and lips, buccal mucosa, and tongue normal  NECK: supple, thyroid normal size, non-tender, without nodularity LYMPH:  no palpable lymphadenopathy in the cervical, axillary or inguinal LUNGS: clear to auscultation and percussion with normal breathing effort HEART: regular rate & rhythm and no murmurs and no lower extremity edema ABDOMEN:abdomen soft, non-tender and normal bowel sounds Musculoskeletal:no cyanosis of digits and no clubbing  PSYCH: alert & oriented x 3 with fluent  speech NEURO: no focal motor/sensory deficits  LABORATORY DATA:  I have reviewed the data as listed CBC Latest Ref Rng & Units 05/31/2015 11/23/2014 05/18/2014  WBC 3.9 - 10.3 10e3/uL 7.8 7.3 6.9  Hemoglobin 11.6 - 15.9 g/dL 13.5 12.9 13.1  Hematocrit 34.8 - 46.6 % 41.1 39.4 40.8  Platelets 145 - 400 10e3/uL 248 202 230   CMP Latest Ref Rng & Units 09/15/2015 05/31/2015 05/10/2015  Glucose 70 - 99 mg/dL 137(H) 146(H) 208(H)  BUN 6 - 23 mg/dL 9 16.2 20  Creatinine 0.40 - 1.20 mg/dL 0.76 0.9 0.75  Sodium 135 - 145 mEq/L 140 141 140  Potassium 3.5 - 5.1 mEq/L 4.2 4.6 4.1  Chloride 96 - 112 mEq/L 106 - 105  CO2 19 - 32 mEq/L 25 28 23   Calcium 8.4 - 10.5 mg/dL 9.1 9.4 8.7  Total Protein 6.0 - 8.3 g/dL 7.3 7.5 -  Total Bilirubin 0.2 - 1.2 mg/dL 0.5 0.46 -  Alkaline Phos 39 - 117 U/L 134(H) 116 -  AST 0 - 37  U/L 14 15 -  ALT 0 - 35 U/L 16 18 -   CHROMOGRANIN A 03/02/2013: <5 05/18/2014: 7 11/23/2015: 7 05/31/2015: 11   PATHOLOGY REPORT  Small intestine, resection for tumor, ascending 02/03/2014 - INVASIVE WELL DIFFERENTIATED NEUROENDOCRINE TUMOR (CARCINOID), SPANNING 3 CM IN GREATEST DIMENSION. - TUMOR INVADES MUSCULARIS PROPRIA TO INVOLVE SUBSEROSAL SOFT TISSUES. - MARGINS ARE NEGATIVE. - FIVE OF TWENTY-FIVE LYMPH NODES ARE POSITIVE FOR METASTATIC LOW GRADE NEUROENDOCRINE TUMOR (5/25). - EXTRACAPSULAR EXTENSION IS IDENTIFIED. - SEE ONCOLOGY TEMPLATE. Specimen: Portion of terminal ileum with attached proximal portion of right colon. Procedure: Laparoscopic assisted right colectomy and resection of distal ileum. Tumor Site: Distal terminal ileum, 3 cm proximal to ileocecal valve. Tumor Size: 3 cm in greatest dimension. Tumor Focality: Unifocal. Histologic Type: Well differentiated neuroendocrine tumor (carcinoid tumor) . Histologic Grade : G1: Low grade. Mitotic Rate low mitotic rate, less than 1/10 mitoses per hpf (high power field). Microscopic Tumor Extension: Tumor invades through muscularis propria to involve subserosal soft tissues, Margins: Proximal Margin: Negative. Distal Margin: Negative. Mesenteric (Radial) Margin : Negative. All margins uninvolved by neuroendocrine tumor: Distance from closest margin: 4.5 cm Margin: Mesenteric margin. Lymph-Vascular Invasion: Tumor is seen involving a subcapsular sinus space (lymphatic space) in one of the lymph nodes. Perineural Invasion: Not identified. Lymph nodes: number examined - 25; number positive: 5. TNM Staging: pT3, pN1.   RADIOGRAPHIC STUDIES: I have personally reviewed the radiological images as listed and agreed with the findings in the report.  CT abdomen and pelvis 05/31/2015 IMPRESSION: 1. No evidence of metastatic disease. 2. Question mild basilar subpleural pulmonary fibrosis.   ASSESSMENT & PLAN:    70 year old female with newly diagnosed carcinoid tumor at the terminal ileum near the ileumcecal valve.  1. 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 Surgical margins were negative. -I discussed her diagnosis and the staging with her in great details.  -There is no role of adjuvant chemotherapy for resected carcinoid tumor.  -We discussed the risks of tumor recurrence in the future, including late recurrence. -Her postop chromogranin A and urine 5 HIAA level were normal. Those were not checked before surgery. -I recommend a total of 10 years surveillance, with office visit, lab every 6 months for the first 2 years, then every 6-12 months afterwards. CT scans can be considered for surveillance, per [place] and guideline. -I discussed  her restaging CT scan on 05/31/2015, which showed no evidence of recurrence. -Lab results reviewed with her, CBC, CMP and chromogranin A levels are normal.  2. HTN, DM, hypothyroidism -She wants a new primary care physician, but has not called for appointment yet. I strongly encouraged her to call as soon as possible -I refilled her metformin, Synthroid, singular, and told her she should get this medication refill by her new primary care physician.  Plan: Return to clinic in 6 months with lab one week before   All questions were answered. The patient knows to call the clinic with any problems, questions or concerns. I spent 20 minutes counseling the patient face to face. The total time spent in the appointment was 30 minutes and more than 50% was on counseling.     Truitt Merle, MD 12/13/2015

## 2015-12-15 ENCOUNTER — Encounter: Payer: Medicare Other | Admitting: Internal Medicine

## 2015-12-15 NOTE — Progress Notes (Signed)
    Subjective:    Patient ID: Jessica Farrell, female    DOB: 21-Jul-1945, 70 y.o.   MRN: [id number]  HPI   Patient Active Problem List   Diagnosis Date Noted  . 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 Chronic venous insufficiency 08/02/2012  . HLD (hyperlipidemia) 05/15/2012  . Diabetes mellitus ([place]) 05/05/2012  . Hypothyroidism 05/05/2012  . Asthma 05/05/2012    Current Outpatient Prescriptions on File Prior to Visit  Medication Sig Dispense Refill  . Albuterol Sulfate 108 (90 Base) MCG/ACT AEPB Inhale 2 puffs into the lungs every 4 (four) hours as needed (wheezing/SOB). Reported on 06/07/2015 1 each 0  . atorvastatin (LIPITOR) 10 MG tablet Take 1 tablet (10 mg total) by mouth daily. 90 tablet 1  . levothyroxine (SYNTHROID, LEVOTHROID) 112 MCG tablet Take 1 tablet (112 mcg total) by mouth daily. 90 tablet 0  . metFORMIN (GLUCOPHAGE) 500 MG tablet Take 1 tablet (500 mg total) by mouth 2 (two) times daily with a meal. 180 tablet 1  . montelukast (SINGULAIR) 10 MG tablet Take 1 tablet (10 mg total) by mouth at bedtime. 30 tablet 1   No current facility-administered medications on file prior to visit.     Past Medical History:  Diagnosis Date  . Asthma   . Cellulitis March  2014   Left foot and ankle  . colon ca dx'd 01/2014  . Colon polyps    2015   . Diabetes mellitus ([place]) 05/05/2012   Type II  . Hyperlipidemia   . Morbid obesity ([place])   . Thyroid disease 1990's   HypoThyroidism    Past Surgical History:  Procedure Laterality Date  . COLON RESECTION N/A 02/03/2014   Procedure: LAPAROSCOPIC ASSISTED BOWEL RESECTION;  Surgeon: Jackolyn Confer, MD;  Location: [facility];  Service: General;  Laterality: N/A;  . FRACTURE SURGERY  2000   Ankle  . TOOTH EXTRACTION     wisdom Teeth    Social History   Social History  . Marital status: Divorced    Spouse name: N/A  . Number of children: N/A  . Years of education: N/A   Social  History Main Topics  . Smoking status: Never Smoker  . Smokeless tobacco: Never Used  . Alcohol use No  . Drug use: No  . Sexual activity: Not Currently    Birth control/ protection: Post-menopausal   Other Topics Concern  . Not on file   Social History Narrative  . No narrative on file    Family History  Problem Relation Age of Onset  . Alzheimer's disease Father   . Heart disease Mother   . Arthritis Mother   . Hypertension Mother   . Alzheimer's disease Sister   . Cancer Sister 28    breast cancer   . Heart disease Brother     Heart Disease before age 27  . Colon cancer Neg Hx   . Esophageal cancer Neg Hx   . Rectal cancer Neg Hx   . Stomach cancer Neg Hx     Review of Systems     Objective:  There were no vitals filed for this visit. There were no vitals filed for this visit. There is no height or weight on file to calculate BMI.   Physical Exam         Assessment & Plan:      This encounter was created in error - please disregard.

## 2015-12-20 ENCOUNTER — Telehealth: Payer: Self-pay | Admitting: Hematology

## 2015-12-20 ENCOUNTER — Other Ambulatory Visit: Payer: Self-pay | Admitting: Internal Medicine

## 2015-12-20 NOTE — Telephone Encounter (Signed)
sw pt to r/s missed lab/ov appt per LOS

## 2015-12-29 ENCOUNTER — Ambulatory Visit (INDEPENDENT_AMBULATORY_CARE_PROVIDER_SITE_OTHER): Payer: Medicare Other | Admitting: Internal Medicine

## 2015-12-29 ENCOUNTER — Encounter: Payer: Self-pay | Admitting: Internal Medicine

## 2015-12-29 ENCOUNTER — Other Ambulatory Visit (INDEPENDENT_AMBULATORY_CARE_PROVIDER_SITE_OTHER): Payer: Medicare Other

## 2015-12-29 VITALS — BP 112/76 | HR 113 | Temp 97.8°F | Resp 16 | Ht 65.0 in | Wt 176.0 lb

## 2015-12-29 DIAGNOSIS — Z1159 Encounter for screening for other viral diseases: Secondary | ICD-10-CM

## 2015-12-29 DIAGNOSIS — E038 Other specified hypothyroidism: Secondary | ICD-10-CM | POA: Diagnosis not present

## 2015-12-29 DIAGNOSIS — E785 Hyperlipidemia, unspecified: Secondary | ICD-10-CM | POA: Diagnosis not present

## 2015-12-29 DIAGNOSIS — Z23 Encounter for immunization: Secondary | ICD-10-CM

## 2015-12-29 DIAGNOSIS — E1165 Type 2 diabetes mellitus with hyperglycemia: Secondary | ICD-10-CM | POA: Diagnosis not present

## 2015-12-29 LAB — COMPREHENSIVE METABOLIC PANEL
ALT: 13 U/L (ref 0–35)
AST: 15 U/L (ref 0–37)
Albumin: 4.1 g/dL (ref 3.5–5.2)
Alkaline Phosphatase: 108 U/L (ref 39–117)
BUN: 16 mg/dL (ref 6–23)
CO2: 31 mEq/L (ref 19–32)
Calcium: 9.7 mg/dL (ref 8.4–10.5)
Chloride: 103 mEq/L (ref 96–112)
Creatinine, Ser: 0.81 mg/dL (ref 0.40–1.20)
GFR: 74.16 mL/min (ref >60.00)
Glucose, Bld: 142 mg/dL — ABNORMAL HIGH (ref 70–99)
Potassium: 4.4 mEq/L (ref 3.5–5.1)
Sodium: 139 mEq/L (ref 135–145)
Total Bilirubin: 0.3 mg/dL (ref 0.2–1.2)
Total Protein: 7.4 g/dL (ref 6.0–8.3)

## 2015-12-29 LAB — TSH: TSH: 25.97 u[IU]/mL — ABNORMAL HIGH (ref 0.35–4.50)

## 2015-12-29 LAB — HEMOGLOBIN A1C: HEMOGLOBIN A1C: 7.1 % — AB (ref 4.6–6.5)

## 2015-12-29 NOTE — Assessment & Plan Note (Signed)
Taking lipitor Continue above Continue weight loss efforts

## 2015-12-29 NOTE — Assessment & Plan Note (Signed)
Check tsh  Titrate med dose if needed  

## 2015-12-29 NOTE — Assessment & Plan Note (Addendum)
Has lost weight, changed diet, walks a lot at work - no formal exercise Taking metformin twice daily Check a1c, cmp

## 2015-12-29 NOTE — Progress Notes (Signed)
Subjective:    Patient ID: Jessica Farrell, female    DOB: [birth year], 70 y.o.   MRN: [id number]  HPI She is here for follow up.  Diabetes: She is taking her medication daily as prescribed. She sometimes forgets the second metformin (25 %).  She is compliant with a diabetic diet - she has really been watching what she eats and has lost 14 lbs. She is active, but does not exercise regularly.  She checks her feet daily and denies foot lesions. She is up-to-date with an ophthalmology examination.   Hypothyroidism:  She is taking her medication daily.  She feels her energy is better, but still low.  She has intentionally lost weight    Hyperlipidemia: She is taking her medication daily. She is compliant with a low fat/cholesterol diet. She is active, but not exercising. She denies myalgias.     Medications and allergies reviewed with patient and updated if appropriate.  Patient Active Problem List   Diagnosis Date Noted  . 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 Chronic venous insufficiency 08/02/2012  . HLD (hyperlipidemia) 05/15/2012  . Diabetes mellitus ([place]) 05/05/2012  . Hypothyroidism 05/05/2012  . Asthma 05/05/2012    Current Outpatient Prescriptions on File Prior to Visit  Medication Sig Dispense Refill  . Albuterol Sulfate 108 (90 Base) MCG/ACT AEPB Inhale 2 puffs into the lungs every 4 (four) hours as needed (wheezing/SOB). Reported on 06/07/2015 1 each 0  . atorvastatin (LIPITOR) 10 MG tablet Take 1 tablet (10 mg total) by mouth daily. 90 tablet 1  . levothyroxine (SYNTHROID, LEVOTHROID) 112 MCG tablet TAKE ONE TABLET BY MOUTH ONCE DAILY 90 tablet 0  . metFORMIN (GLUCOPHAGE) 500 MG tablet Take 1 tablet (500 mg total) by mouth 2 (two) times daily with a meal. 180 tablet 1  . montelukast (SINGULAIR) 10 MG tablet Take 1 tablet (10 mg total) by mouth at bedtime. 30 tablet 1   No current facility-administered medications on file prior to  visit.     Past Medical History:  Diagnosis Date  . Asthma   . Cellulitis March  2014   Left foot and ankle  . colon ca dx'd 01/2014  . Colon polyps    2015   . Diabetes mellitus ([place]) 05/05/2012   Type II  . Hyperlipidemia   . Morbid obesity ([place])   . Thyroid disease 1990's   HypoThyroidism    Past Surgical History:  Procedure Laterality Date  . COLON RESECTION N/A 02/03/2014   Procedure: LAPAROSCOPIC ASSISTED BOWEL RESECTION;  Surgeon: Jackolyn Confer, MD;  Location: [facility];  Service: General;  Laterality: N/A;  . FRACTURE SURGERY  2000   Ankle  . TOOTH EXTRACTION     wisdom Teeth    Social History   Social History  . Marital status: Divorced    Spouse name: N/A  . Number of children: N/A  . Years of education: N/A   Social History Main Topics  . Smoking status: Never Smoker  . Smokeless tobacco: Never Used  . Alcohol use No  . Drug use: No  . Sexual activity: Not Currently    Birth control/ protection: Post-menopausal   Other Topics Concern  . Not on file   Social History Narrative  . No narrative on file    Family History  Problem Relation Age of Onset  . Alzheimer's disease Father   . Heart disease Mother   . Arthritis Mother   . Hypertension Mother   .  Alzheimer's disease Sister   . Cancer Sister 24    breast cancer   . Heart disease Brother     Heart Disease before age 43  . Colon cancer Neg Hx   . Esophageal cancer Neg Hx   . Rectal cancer Neg Hx   . Stomach cancer Neg Hx     Review of Systems  Constitutional: Negative for chills and fever.       Energy level still low but better  HENT: Positive for congestion.   Respiratory: Positive for cough (from congestion, PND). Negative for shortness of breath and wheezing.   Cardiovascular: Negative for chest pain, palpitations and leg swelling.  Neurological: Negative for light-headedness and headaches.       Objective:   Vitals:   12/29/15 1441  BP: (!) 146/74  Pulse: (!) 113  Resp:  16  Temp: 97.8 F (36.6 C)   Filed Weights   12/29/15 1441  Weight: 176 lb (79.8 kg)   Body mass index is 29.29 kg/m.   Physical Exam Constitutional: Appears well-developed and well-nourished. No distress.  HENT:  Head: Normocephalic and atraumatic.  Neck: Neck supple. No tracheal deviation present. No thyromegaly present.  No cervical lymphadenopathy Cardiovascular: Normal rate, regular rhythm and normal heart sounds.   No murmur heard. No carotid bruit .  No edema Pulmonary/Chest: Effort normal and breath sounds normal. No respiratory distress. No has no wheezes. No rales.  Skin: Skin is warm and dry. Not diaphoretic.  Psychiatric: Normal mood and affect. Behavior is normal.       Assessment & Plan:   See Problem List for Assessment and Plan of chronic medical problems.  F/u in 6 months

## 2015-12-29 NOTE — Patient Instructions (Addendum)

## 2015-12-29 NOTE — Progress Notes (Signed)
Pre visit review using our clinic review tool, if applicable. No additional management support is needed unless otherwise documented below in the visit note. 

## 2015-12-30 LAB — HEPATITIS C ANTIBODY: HCV Ab: NEGATIVE

## 2015-12-31 ENCOUNTER — Other Ambulatory Visit: Payer: Self-pay | Admitting: Emergency Medicine

## 2015-12-31 DIAGNOSIS — E039 Hypothyroidism, unspecified: Secondary | ICD-10-CM

## 2015-12-31 MED ORDER — LEVOTHYROXINE SODIUM 150 MCG PO TABS: 150.0000 ug | ORAL_TABLET | Freq: Every day | ORAL | 0 refills | Status: DC

## 2016-01-20 ENCOUNTER — Other Ambulatory Visit (HOSPITAL_BASED_OUTPATIENT_CLINIC_OR_DEPARTMENT_OTHER): Payer: Medicare Other

## 2016-01-20 DIAGNOSIS — C7B8 Other secondary neuroendocrine tumors: Secondary | ICD-10-CM

## 2016-01-20 DIAGNOSIS — C7A012 Malignant carcinoid tumor of the ileum: Secondary | ICD-10-CM | POA: Diagnosis present

## 2016-01-20 LAB — COMPREHENSIVE METABOLIC PANEL
ALT: 22 U/L (ref 0–55)
AST: 20 U/L (ref 5–34)
Albumin: 3.4 g/dL — ABNORMAL LOW (ref 3.5–5.0)
Alkaline Phosphatase: 152 U/L — ABNORMAL HIGH (ref 40–150)
Anion Gap: 9 mEq/L (ref 3–11)
BUN: 17.3 mg/dL (ref 7.0–26.0)
CALCIUM: 9.2 mg/dL (ref 8.4–10.4)
CO2: 26 meq/L (ref 22–29)
CREATININE: 0.8 mg/dL (ref 0.6–1.1)
Chloride: 104 mEq/L (ref 98–109)
EGFR: 74 mL/min/{1.73_m2} — ABNORMAL LOW (ref >90)
Glucose: 148 mg/dl — ABNORMAL HIGH (ref 70–140)
Potassium: 4.2 mEq/L (ref 3.5–5.1)
Sodium: 139 mEq/L (ref 136–145)
Total Bilirubin: 0.31 mg/dL (ref 0.20–1.20)
Total Protein: 7.3 g/dL (ref 6.4–8.3)

## 2016-01-20 LAB — CBC & DIFF AND RETIC
BASO%: 0.4 % (ref 0.0–2.0)
Basophils Absolute: 0 10*3/uL (ref 0.0–0.1)
EOS ABS: 0.2 10*3/uL (ref 0.0–0.5)
EOS%: 2.1 % (ref 0.0–7.0)
HEMATOCRIT: 38.7 % (ref 34.8–46.6)
HGB: 12.6 g/dL (ref 11.6–15.9)
Immature Retic Fract: 2.3 % (ref 1.60–10.00)
LYMPH#: 2.7 10*3/uL (ref 0.9–3.3)
LYMPH%: 31.1 % (ref 14.0–49.7)
MCH: 28.8 pg (ref 25.1–34.0)
MCHC: 32.6 g/dL (ref 31.5–36.0)
MCV: 88.4 fL (ref 79.5–101.0)
MONO#: 0.6 10*3/uL (ref 0.1–0.9)
MONO%: 6.6 % (ref 0.0–14.0)
NEUT#: 5.1 10*3/uL (ref 1.5–6.5)
NEUT%: 59.8 % (ref 38.4–76.8)
Platelets: 246 10*3/uL (ref 145–400)
RBC: 4.38 10*6/uL (ref 3.70–5.45)
RDW: 13.7 % (ref 11.2–14.5)
RETIC %: 1.14 % (ref 0.70–2.10)
RETIC CT ABS: 49.93 10*3/uL (ref 33.70–90.70)
WBC: 8.5 10*3/uL (ref 3.9–10.3)

## 2016-01-24 LAB — CHROMOGRANIN A: Chromogranin A: 2 nmol/L (ref 0–5)

## 2016-01-27 ENCOUNTER — Telehealth: Payer: Self-pay | Admitting: Hematology

## 2016-01-27 ENCOUNTER — Ambulatory Visit (HOSPITAL_BASED_OUTPATIENT_CLINIC_OR_DEPARTMENT_OTHER): Payer: Medicare Other | Admitting: Hematology

## 2016-01-27 ENCOUNTER — Encounter: Payer: Self-pay | Admitting: Hematology

## 2016-01-27 VITALS — BP 130/84 | HR 98 | Temp 97.7°F | Resp 17 | Ht 65.0 in | Wt 177.2 lb

## 2016-01-27 DIAGNOSIS — E1165 Type 2 diabetes mellitus with hyperglycemia: Secondary | ICD-10-CM

## 2016-01-27 DIAGNOSIS — E038 Other specified hypothyroidism: Secondary | ICD-10-CM

## 2016-01-27 DIAGNOSIS — C7A012 Malignant carcinoid tumor of the ileum: Secondary | ICD-10-CM | POA: Diagnosis not present

## 2016-01-27 DIAGNOSIS — E039 Hypothyroidism, unspecified: Secondary | ICD-10-CM | POA: Diagnosis not present

## 2016-01-27 DIAGNOSIS — C7B8 Other secondary neuroendocrine tumors: Secondary | ICD-10-CM

## 2016-01-27 DIAGNOSIS — E119 Type 2 diabetes mellitus without complications: Secondary | ICD-10-CM | POA: Diagnosis not present

## 2016-01-27 DIAGNOSIS — I10 Essential (primary) hypertension: Secondary | ICD-10-CM | POA: Diagnosis not present

## 2016-01-27 NOTE — Progress Notes (Signed)
[facility]  [phone] [phone]  Clinic Follow up Note   Patient Care Team: Binnie Rail, MD as PCP - General (Internal Medicine) Truitt Merle, MD as Consulting Physician (Hematology) Jackolyn Confer, MD as Consulting Physician (General Surgery) 01/27/2016  CHIEF COMPLAINTS:  Follow up carcinoid tumor    Oncology History   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   Staging form: Colon and Rectum, AJCC 7th Edition     Clinical: T3, N2, M0 - Unsigned       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 ([place])   01/21/2014 Imaging    CT: Distal small bowel obstruction due to 3.4 cm soft tissue mass near the ileocecal valve, with adjacent right lower quadrant mesenteric lymphadenopathy      02/03/2014 Pathologic Stage    invasive well diff neuroendocrine tumor (carcinoid), 3cm, pT3pN2 (5/25 nodes positive). negative margines.      02/03/2014 Surgery    Laparoscopic-assisted right colectomy and resection of distal ileum      02/03/2014 Initial Diagnosis    Metastatic malignant neuroendocrine tumor of the ileocecal valve to regional lymph nodes.        HISTORY OF PRESENTING ILLNESS: Jessica Farrell is very nice 70 year old white female who was referred by GI Dr. Zella Richer for newly diagnosed small intestine carcinoid tumor.   She presented with 5-6 week history of mid and generalized abdominal discomfort with episodes of sharp intense abdominal pain, intermittent nausea and vomiting and diarrhea with loose stools generally postprandially. She was referred to GI Dr. Lillia Dallas who ordered a CT of the abdomen which was done on 01/21/2014. It shows a 2.6 x 3.4 cm soft tissue mass at the ileocecal valve there is mild adjacent adenopathy. There is also moderate dilation of the distal small bowel loops. Unclear whether this represents an adenocarcinoma or possibly a carcinoid. She underwent Colonoscopy 01/28/2014 and a  circumferential mass was found within the ileocecal valve and partially obstructing the lumen ,multiple biopsies were performed which came back negative for malignancy.   She presented to ED with abdominal pain, nausea and vomiting on 02/03/2014 and was admitted for SBO, and underwent Laparoscopic-assisted right colectomy and resection of distal ileum on 02/03/2014 by Dr. Zella Richer .   She has recovered well after surgery. The incision related pain is near resolved, still has small discharge form the ssurgical wound. She eats well, she has been having diarrhea since the surgery, improved, 3 loose BM daily now. She lost about 50 lbs in the past 2 years, which she contributes mainly to her diet control for DM.   CURRENT THERAPY: Observation   INTERIM HISTORY: Jessica Farrell returns for follow-up. She is doing well overall. She still has loose stool, for which he takes Imodium 1 tablet day. No abdominal pain, nausea, or other symptoms. She has good appetite and energy level, functions 3 well at home. She had first great grandchildren 4 months ago.  MEDICAL HISTORY:  Past Medical History:  Diagnosis Date  . Asthma   . Cellulitis March  2014   Left foot and ankle  . colon ca dx'd 01/2014  . Colon polyps    2015   . Diabetes mellitus ([place]) 05/05/2012   Type II  . Hyperlipidemia   . Morbid obesity ([place])   . Thyroid disease 1990's   HypoThyroidism    SURGICAL HISTORY: Past Surgical History:  Procedure Laterality Date  . COLON RESECTION N/A 02/03/2014   Procedure: LAPAROSCOPIC ASSISTED BOWEL RESECTION;  Surgeon: Sherren Mocha  Rosenbower, MD;  Location: [facility];  Service: General;  Laterality: N/A;  . FRACTURE SURGERY  2000   Ankle  . TOOTH EXTRACTION     wisdom Teeth    SOCIAL HISTORY: History   Social History  . Marital Status: Divorced    Spouse Name: N/A    Number of Children: 2  . Years of Education: N/A   Occupational History  . Secretary, still work part time    Social History Main  Topics  . Smoking status: Never Smoker   . Smokeless tobacco: Never Used  . Alcohol Use: No  . Drug Use: No  . Sexual Activity: Not Currently    Birth Control/ Protection: Post-menopausal   Other Topics Concern  . Not on file   Social History Narrative    FAMILY HISTORY: Family History  Problem Relation Age of Onset  . Alzheimer's disease Father   . Heart disease Mother   . Arthritis Mother   . Hypertension Mother   . Alzheimer's disease Sister   . Cancer Sister 61    breast cancer   . Heart disease Brother     Heart Disease before age 10  . Colon cancer Neg Hx   . Esophageal cancer Neg Hx   . Rectal cancer Neg Hx   . Stomach cancer Neg Hx     ALLERGIES:  is allergic to penicillins.  MEDICATIONS:  Current Outpatient Prescriptions  Medication Sig Dispense Refill  . Albuterol Sulfate 108 (90 Base) MCG/ACT AEPB Inhale 2 puffs into the lungs every 4 (four) hours as needed (wheezing/SOB). Reported on 06/07/2015 1 each 0  . atorvastatin (LIPITOR) 10 MG tablet Take 1 tablet (10 mg total) by mouth daily. 90 tablet 1  . levothyroxine (SYNTHROID, LEVOTHROID) 150 MCG tablet Take 1 tablet (150 mcg total) by mouth daily. 90 tablet 0  . metFORMIN (GLUCOPHAGE) 500 MG tablet Take 1 tablet (500 mg total) by mouth 2 (two) times daily with a meal. 180 tablet 1  . montelukast (SINGULAIR) 10 MG tablet Take 1 tablet (10 mg total) by mouth at bedtime. 30 tablet 1   No current facility-administered medications for this visit.     REVIEW OF SYSTEMS:   Constitutional: Denies fevers, chills or abnormal night sweats Eyes: Denies blurriness of vision, double vision or watery eyes Ears, nose, mouth, throat, and face: Denies mucositis or sore throat Respiratory: Denies cough, dyspnea or wheezes Cardiovascular: Denies palpitation, chest discomfort or lower extremity swelling Gastrointestinal:  Denies nausea, heartburn or change in bowel habits Skin: Denies abnormal skin rashes Lymphatics: Denies  new lymphadenopathy or easy bruising Neurological:Denies numbness, tingling or new weaknesses Behavioral/Psych: Mood is stable, no new changes  All other systems were reviewed with the patient and are negative.  PHYSICAL EXAMINATION: ECOG PERFORMANCE STATUS: 0 - Asymptomatic  Vitals:   01/27/16 1438  BP: 130/84  Pulse: 98  Resp: 17  Temp: 97.7 F (36.5 C)   Filed Weights   01/27/16 1438  Weight: 177 lb 3.2 oz (80.4 kg)    GENERAL:alert, no distress and comfortable SKIN: skin color, texture, turgor are normal, no rashes or significant lesions EYES: normal, conjunctiva are pink and non-injected, sclera clear OROPHARYNX:no exudate, no erythema and lips, buccal mucosa, and tongue normal  NECK: supple, thyroid normal size, non-tender, without nodularity LYMPH:  no palpable lymphadenopathy in the cervical, axillary or inguinal LUNGS: clear to auscultation and percussion with normal breathing effort HEART: regular rate & rhythm and no murmurs and no lower extremity edema  ABDOMEN:abdomen soft, non-tender and normal bowel sounds Musculoskeletal:no cyanosis of digits and no clubbing  PSYCH: alert & oriented x 3 with fluent speech NEURO: no focal motor/sensory deficits  LABORATORY DATA:  I have reviewed the data as listed Lab Results  Component Value Date   WBC 8.5 01/20/2016   HGB 12.6 01/20/2016   HCT 38.7 01/20/2016   MCV 88.4 01/20/2016   PLT 246 01/20/2016    Recent Labs  05/10/15 1728  09/15/15 1054 12/29/15 1543 01/20/16 1329  NA 140  < > 140 139 139  K 4.1  < > 4.2 4.4 4.2  CL 105  --  106 103  --   CO2 23  < > 25 31 26   GLUCOSE 208*  < > 137* 142* 148*  BUN 20  < > 9 16 17.3  CREATININE 0.75  < > 0.76 0.81 0.8  CALCIUM 8.7  < > 9.1 9.7 9.2  PROT  --   < > 7.3 7.4 7.3  ALBUMIN  --   < > 3.8 4.1 3.4*  AST  --   < > 14 15 20   ALT  --   < > 16 13 22   ALKPHOS  --   < > 134* 108 152*  BILITOT  --   < > 0.5 0.3 0.31  < > = values in this interval not  displayed.  Chromogranin A  Order: [id number]  Status:  Final result Visible to patient:  No (Not Released) Next appt:  Today at 02:15 PM in Oncology [facility] Medico, Krista Blue, MD) Dx:  Metastatic malignant neuroendocrine t...    Ref Range & Units 7d ago 80mo ago   Chromogranin A 0 - 5 nmol/L 2  2CM          PATHOLOGY REPORT  Small intestine, resection for tumor, ascending 02/03/2014 - INVASIVE WELL DIFFERENTIATED NEUROENDOCRINE TUMOR (CARCINOID), SPANNING 3 CM IN GREATEST DIMENSION. - TUMOR INVADES MUSCULARIS PROPRIA TO INVOLVE SUBSEROSAL SOFT TISSUES. - MARGINS ARE NEGATIVE. - FIVE OF TWENTY-FIVE LYMPH NODES ARE POSITIVE FOR METASTATIC LOW GRADE NEUROENDOCRINE TUMOR (5/25). - EXTRACAPSULAR EXTENSION IS IDENTIFIED. - SEE ONCOLOGY TEMPLATE. Specimen: Portion of terminal ileum with attached proximal portion of right colon. Procedure: Laparoscopic assisted right colectomy and resection of distal ileum. Tumor Site: Distal terminal ileum, 3 cm proximal to ileocecal valve. Tumor Size: 3 cm in greatest dimension. Tumor Focality: Unifocal. Histologic Type: Well differentiated neuroendocrine tumor (carcinoid tumor) . Histologic Grade : G1: Low grade. Mitotic Rate low mitotic rate, less than 1/10 mitoses per hpf (high power field). Microscopic Tumor Extension: Tumor invades through muscularis propria to involve subserosal soft tissues, Margins: Proximal Margin: Negative. Distal Margin: Negative. Mesenteric (Radial) Margin : Negative. All margins uninvolved by neuroendocrine tumor: Distance from closest margin: 4.5 cm Margin: Mesenteric margin. Lymph-Vascular Invasion: Tumor is seen involving a subcapsular sinus space (lymphatic space) in one of the lymph nodes. Perineural Invasion: Not identified. Lymph nodes: number examined - 25; number positive: 5. TNM Staging: pT3, pN1.   RADIOGRAPHIC STUDIES: I have personally reviewed the radiological images as listed and agreed with the findings in the  report.  CT abdomen and pelvis 05/31/2015 IMPRESSION: 1. No evidence of metastatic disease. 2. Question mild basilar subpleural pulmonary fibrosis.   ASSESSMENT & PLAN:  70 year old female with newly diagnosed carcinoid tumor at the terminal ileum near the ileumcecal valve.  1. 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 Surgical margins  were negative. -I previously discussed her diagnosis and the staging with her in great details.  -There is no role of adjuvant chemotherapy for resected carcinoid tumor.  -We discussed the risks of tumor recurrence in the future, including late recurrence. -Her postop chromogranin A and urine 5 HIAA level were normal. Those were not checked before surgery. -I recommend a total of 10 years surveillance, with office visit, lab every 6 months for the first 2 years, then every 6-12 months afterwards. CT scans can be considered for surveillance, per [place] [facility] and guideline. -I previously discussed her restaging CT scan on 05/31/2015, which showed no evidence of recurrence. -Lab results from last week reviewed with her, CBC, CMP and chromogranin A levels are normal. -Patient is clinically doing very well, a somatic, exam was unremarkable, no clinical concern of recurrence. -Patient prefer not to do many CT scans, will do once every 2 years  2. HTN, DM, hypothyroidism -She will continue medication and follow-up with her primary care physician.  Plan: Return to clinic in 6 months with lab one week before   All questions were answered. The patient knows to call the clinic with any problems, questions or concerns. I spent 15 minutes counseling the patient face to face. The total time spent in the appointment was 20 minutes and more than 50% was on counseling.     Truitt Merle, MD 01/27/2016 3:00 PM

## 2016-01-27 NOTE — Telephone Encounter (Signed)
Appointments scheduled per 01/27/16 los. A copy of the AVS report and appointment schedule was given to the patient, per 01/27/16 los.  °

## 2016-02-10 ENCOUNTER — Telehealth: Payer: Self-pay | Admitting: Internal Medicine

## 2016-02-10 NOTE — Telephone Encounter (Signed)
Patient states she believes she has a kidney infection.  States she is having back pain and burning when she urinates.  She is requesting Dr. Quay Burow to send an antibiotic to [facility] on [facility].

## 2016-02-10 NOTE — Telephone Encounter (Signed)
FYI

## 2016-02-10 NOTE — Telephone Encounter (Signed)
Patient also wants to know when she needs to come back to do labs.

## 2016-02-10 NOTE — Telephone Encounter (Signed)
She really needs to be evaluated by someone - should make an appt here, other office or urgent care

## 2016-02-10 NOTE — Telephone Encounter (Signed)
Please advise 

## 2016-02-10 NOTE — Telephone Encounter (Signed)
D[place], please schedule pt for first available with any provider or office.

## 2016-02-10 NOTE — Telephone Encounter (Signed)
Called patient- She refused an appt at [place], which was the only local opening today She refused a morning appt here tomorrow and also any appt at [place] I disconnected without scheduling her anywhere, and she advised that she would tough it out

## 2016-02-11 NOTE — Telephone Encounter (Signed)
noted 

## 2016-02-18 ENCOUNTER — Encounter: Payer: Self-pay | Admitting: Internal Medicine

## 2016-02-18 ENCOUNTER — Ambulatory Visit (INDEPENDENT_AMBULATORY_CARE_PROVIDER_SITE_OTHER): Payer: Medicare Other | Admitting: Internal Medicine

## 2016-02-18 ENCOUNTER — Other Ambulatory Visit (INDEPENDENT_AMBULATORY_CARE_PROVIDER_SITE_OTHER): Payer: Medicare Other

## 2016-02-18 VITALS — BP 136/72 | HR 83 | Resp 20 | Wt 178.0 lb

## 2016-02-18 DIAGNOSIS — E038 Other specified hypothyroidism: Secondary | ICD-10-CM

## 2016-02-18 DIAGNOSIS — E1165 Type 2 diabetes mellitus with hyperglycemia: Secondary | ICD-10-CM | POA: Diagnosis not present

## 2016-02-18 DIAGNOSIS — R300 Dysuria: Secondary | ICD-10-CM

## 2016-02-18 LAB — T4, FREE: Free T4: 0.87 ng/dL (ref 0.60–1.60)

## 2016-02-18 LAB — POCT URINALYSIS DIPSTICK
BILIRUBIN UA: NEGATIVE
GLUCOSE UA: NEGATIVE
Ketones, UA: NEGATIVE
Nitrite, UA: NEGATIVE
PH UA: 6
Protein, UA: NEGATIVE
RBC UA: NEGATIVE
SPEC GRAV UA: 1.015
UROBILINOGEN UA: NEGATIVE

## 2016-02-18 LAB — TSH: TSH: 8.1 u[IU]/mL — ABNORMAL HIGH (ref 0.35–4.50)

## 2016-02-18 MED ORDER — NITROFURANTOIN MACROCRYSTAL 50 MG PO CAPS: 50.0000 mg | ORAL_CAPSULE | Freq: Four times a day (QID) | ORAL | 0 refills | Status: DC

## 2016-02-18 NOTE — Assessment & Plan Note (Signed)
stable overall by history and exam, recent data reviewed with pt, and pt to continue medical treatment as before,  to f/u any worsening symptoms or concerns Lab Results  Component Value Date   HGBA1C 7.1 (H) 12/29/2015   No need repeat today, cont same tx

## 2016-02-18 NOTE — Progress Notes (Signed)
Pre visit review using our clinic review tool, if applicable. No additional management support is needed unless otherwise documented below in the visit note. 

## 2016-02-18 NOTE — Assessment & Plan Note (Signed)
Likely c/w uti, Udip reviewed with pt, appears uncomplicated, for urine cx, empiric antibx pending results

## 2016-02-18 NOTE — Assessment & Plan Note (Signed)
Asympt on increased thyroid dosing > 6 wks, for f/u lab today per pt request

## 2016-02-18 NOTE — Progress Notes (Signed)
Subjective:    Patient ID: Jessica Farrell, female    DOB: 09/25/1945, 70 y.o.   MRN: [id number]  HPI  Here to c/o urinary symptoms of dysuria and frequency, but no urgency, flank pain, hematuria or n/v, fever, chills; has been for 3 days, mild to mod, intermittent, nothing makes better or worse.  Denies hyper or hypo thyroid symptoms such as voice, skin or hair change.  Did have elev TSH in early Nov 2017 with increased levthyroxine now to 150 mcg qd.  Pt denies chest pain, increased sob or doe, wheezing, orthopnea, PND, increased LE swelling, palpitations, dizziness or syncope. Pt denies new neurological symptoms such as new headache, or facial or extremity weakness or numbness   Pt denies polydipsia, polyuria  No other new history Past Medical History:  Diagnosis Date  . Asthma   . Cellulitis March  2014   Left foot and ankle  . colon ca dx'd 01/2014  . Colon polyps    2015   . Diabetes mellitus ([place]) 05/05/2012   Type II  . Hyperlipidemia   . Morbid obesity ([place])   . Thyroid disease 1990's   HypoThyroidism   Past Surgical History:  Procedure Laterality Date  . COLON RESECTION N/A 02/03/2014   Procedure: LAPAROSCOPIC ASSISTED BOWEL RESECTION;  Surgeon: Jackolyn Confer, MD;  Location: [facility];  Service: General;  Laterality: N/A;  . FRACTURE SURGERY  2000   Ankle  . TOOTH EXTRACTION     wisdom Teeth    reports that she has never smoked. She has never used smokeless tobacco. She reports that she does not drink alcohol or use drugs. family history includes Alzheimer's disease in her father and sister; Arthritis in her mother; Cancer (age of onset: 58) in her sister; Heart disease in her brother and mother; Hypertension in her mother. Allergies  Allergen Reactions  . Penicillins Rash   Current Outpatient Prescriptions on File Prior to Visit  Medication Sig Dispense Refill  . Albuterol Sulfate 108 (90 Base) MCG/ACT AEPB Inhale 2 puffs into the lungs every 4 (four) hours as needed  (wheezing/SOB). Reported on 06/07/2015 1 each 0  . atorvastatin (LIPITOR) 10 MG tablet Take 1 tablet (10 mg total) by mouth daily. 90 tablet 1  . levothyroxine (SYNTHROID, LEVOTHROID) 150 MCG tablet Take 1 tablet (150 mcg total) by mouth daily. 90 tablet 0  . metFORMIN (GLUCOPHAGE) 500 MG tablet Take 1 tablet (500 mg total) by mouth 2 (two) times daily with a meal. 180 tablet 1  . montelukast (SINGULAIR) 10 MG tablet Take 1 tablet (10 mg total) by mouth at bedtime. 30 tablet 1   No current facility-administered medications on file prior to visit.    Review of Systems  Constitutional: Negative for unusual diaphoresis or night sweats HENT: Negative for ear swelling or discharge Eyes: Negative for worsening visual haziness  Respiratory: Negative for choking and stridor.   Gastrointestinal: Negative for distension or worsening eructation Genitourinary: Negative for retention or change in urine volume.  Musculoskeletal: Negative for other MSK pain or swelling Skin: Negative for color change and worsening wound Neurological: Negative for tremors and numbness other than noted  Psychiatric/Behavioral: Negative for decreased concentration or agitation other than above   All other system neg per pt    Objective:   Physical Exam BP 136/72   Pulse 83   Resp 20   Wt 178 lb (80.7 kg)   SpO2 97%   BMI 29.62 kg/m  VS noted,  Constitutional: Pt appears  in no apparent distress HENT: Head: NCAT.  Right Ear: External ear normal.  Left Ear: External ear normal.  Eyes: . Pupils are equal, round, and reactive to light. Conjunctivae and EOM are normal Neck: Normal range of motion. Neck supple.  Cardiovascular: Normal rate and regular rhythm.   Pulmonary/Chest: Effort normal and breath sounds without rales or wheezing.  Abd:  Soft, NT, ND, + BS - benign exam Neurological: Pt is alert. Not confused , motor grossly intact Skin: Skin is warm. No rash, no LE edema Psychiatric: Pt behavior is normal. No  agitation.  No other new exam findings   POCT urinalysis dipstick  Order: [id number]  Status:  Final result Visible to patient:  No (Not Released) Next appt:  06/28/2016 at 01:30 PM in Internal Medicine Binnie Rail, MD) Dx:  Dysuria    Ref Range & Units 15:56 32mo ago 72yr ago 49yr ago   Color, UA  yellow  yellow      Clarity, UA  cloudy  cloudy       Glucose, UA  negative  =500   NEGATIVE  >1000     Bilirubin, UA  negative  negative      Ketones, UA  negative       Spec Grav, UA  1.015  1.025      Blood, UA  negative  trace-l...       pH, UA  6.0  5.5      Protein, UA  negative  negative      Urobilinogen, UA  negative  0.2  0.2  1.0    Nitrite, UA  negative  Positive       Leukocytes, UA Negative small (1+)   small (1+)   SMALLR   SMALLR     Ketones, POC UA   trace (5)               Assessment & Plan:

## 2016-02-18 NOTE — Patient Instructions (Addendum)
Please take all new medication as prescribed - the antibiotic  Your specimen will be sent for culture  Please continue all other medications as before, and refills have been done if requested.  Please have the pharmacy call with any other refills you may need.  Please keep your appointments with your specialists as you may have planned  Please go to the LAB in the Basement (turn left off the elevator) for the tests to be done today per your request  You will be contacted by phone if any changes need to be made immediately.  Otherwise, you will receive a letter about your results with an explanation, but please check with MyChart first.  Please remember to sign up for MyChart if you have not done so, as this will be important to you in the future with finding out test results, communicating by private email, and scheduling acute appointments online when needed.

## 2016-02-21 LAB — URINE CULTURE

## 2016-02-22 ENCOUNTER — Other Ambulatory Visit: Payer: Self-pay | Admitting: Internal Medicine

## 2016-02-22 DIAGNOSIS — E039 Hypothyroidism, unspecified: Secondary | ICD-10-CM

## 2016-02-22 MED ORDER — LEVOTHYROXINE SODIUM 125 MCG PO TABS: 125.0000 ug | ORAL_TABLET | Freq: Every day | ORAL | 3 refills | Status: DC

## 2016-03-20 ENCOUNTER — Ambulatory Visit: Payer: Medicare Other | Admitting: Internal Medicine

## 2016-03-31 ENCOUNTER — Telehealth: Payer: Self-pay | Admitting: Internal Medicine

## 2016-03-31 NOTE — Telephone Encounter (Signed)
Pt called stating she has the flu and wondering if Dr. Quay Burow can call in something for her to [facility]. Please advise.

## 2016-03-31 NOTE — Telephone Encounter (Signed)
She really should be seen.  There are many viruses going around that are similar to the flu and she should be tested.  Can she come in tomorrow?

## 2016-03-31 NOTE — Telephone Encounter (Signed)
Tried contacting pt, unable to LVM 

## 2016-03-31 NOTE — Telephone Encounter (Signed)
Patient called in again about this,. I informed her of Dr. Quay Burow notes. She said she can not come in. She knows its the flu. She really just wants something called. She would like the nurse to follow up with her. Thank you.

## 2016-03-31 NOTE — Telephone Encounter (Signed)
Please advise 

## 2016-04-01 ENCOUNTER — Encounter: Payer: Self-pay | Admitting: Family Medicine

## 2016-04-01 ENCOUNTER — Ambulatory Visit (INDEPENDENT_AMBULATORY_CARE_PROVIDER_SITE_OTHER): Payer: Medicare Other | Admitting: Family Medicine

## 2016-04-01 VITALS — BP 110/70 | HR 87 | Temp 98.4°F | Resp 20 | Ht 65.0 in | Wt 171.0 lb

## 2016-04-01 DIAGNOSIS — J22 Unspecified acute lower respiratory infection: Secondary | ICD-10-CM

## 2016-04-01 DIAGNOSIS — R05 Cough: Secondary | ICD-10-CM

## 2016-04-01 DIAGNOSIS — R059 Cough, unspecified: Secondary | ICD-10-CM

## 2016-04-01 MED ORDER — BENZONATATE 100 MG PO CAPS: 100.0000 mg | ORAL_CAPSULE | Freq: Three times a day (TID) | ORAL | 0 refills | Status: DC

## 2016-04-01 NOTE — Progress Notes (Signed)
Subjective:    Patient ID: Jessica Farrell, female    DOB: 1945-04-12, 71 y.o.   MRN: [id number]  HPI  [name] is a 71 year old female who presents today with cough that is productive intermittently of yellow sputum that started 5 days ago. Associated symptoms of nasal congestion, rhinitis, sinus pressure/pain, chills, myalgias,and post nasal drip have been present but improved per patient. She states that she is feeling better but cough remains. She denies fever, sweats, N/V/D, tooth pain, or SOB/DOE. Treatment at home with Theraflu has provided limited benefit. Smoke:  No Recent antibiotic use:  No History of asthma: Singulair taken daily with great benefit. No albuterol use during this episode; no history of bronchitis. Influenza vaccine is UTD  Review of Systems  Constitutional: Positive for chills. Negative for fatigue and fever.  HENT: Positive for congestion, postnasal drip, rhinorrhea, sinus pain and sinus pressure. Negative for ear pain and sore throat.   Respiratory: Positive for cough. Negative for shortness of breath and wheezing.   Cardiovascular: Negative for chest pain and palpitations.  Gastrointestinal: Negative for abdominal pain, diarrhea, nausea and vomiting.  Musculoskeletal: Positive for myalgias.  Skin: Negative for rash.  Neurological: Negative for dizziness, light-headedness and headaches.   Past Medical History:  Diagnosis Date  . Asthma   . Cellulitis March  2014   Left foot and ankle  . colon ca dx'd 01/2014  . Colon polyps    2015   . Diabetes mellitus ([place]) 05/05/2012   Type II  . Hyperlipidemia   . Morbid obesity ([place])   . Thyroid disease 1990's   HypoThyroidism     Social History   Social History  . Marital status: Divorced    Spouse name: N/A  . Number of children: N/A  . Years of education: N/A   Occupational History  . Not on file.   Social History Main Topics  . Smoking status: Never Smoker  . Smokeless tobacco: Never  Used  . Alcohol use No  . Drug use: No  . Sexual activity: Not Currently    Birth control/ protection: Post-menopausal   Other Topics Concern  . Not on file   Social History Narrative  . No narrative on file    Past Surgical History:  Procedure Laterality Date  . COLON RESECTION N/A 02/03/2014   Procedure: LAPAROSCOPIC ASSISTED BOWEL RESECTION;  Surgeon: Jackolyn Confer, MD;  Location: [facility];  Service: General;  Laterality: N/A;  . FRACTURE SURGERY  2000   Ankle  . TOOTH EXTRACTION     wisdom Teeth    Family History  Problem Relation Age of Onset  . Alzheimer's disease Father   . Heart disease Mother   . Arthritis Mother   . Hypertension Mother   . Alzheimer's disease Sister   . Cancer Sister 70    breast cancer   . Heart disease Brother     Heart Disease before age 58  . Colon cancer Neg Hx   . Esophageal cancer Neg Hx   . Rectal cancer Neg Hx   . Stomach cancer Neg Hx     Allergies  Allergen Reactions  . Penicillins Rash    Current Outpatient Prescriptions on File Prior to Visit  Medication Sig Dispense Refill  . Albuterol Sulfate 108 (90 Base) MCG/ACT AEPB Inhale 2 puffs into the lungs every 4 (four) hours as needed (wheezing/SOB). Reported on 06/07/2015 1 each 0  . atorvastatin (LIPITOR) 10 MG tablet Take 1 tablet (10 mg  total) by mouth daily. 90 tablet 1  . levothyroxine (SYNTHROID, LEVOTHROID) 125 MCG tablet Take 1 tablet (125 mcg total) by mouth daily before breakfast. 90 tablet 3  . metFORMIN (GLUCOPHAGE) 500 MG tablet Take 1 tablet (500 mg total) by mouth 2 (two) times daily with a meal. 180 tablet 1  . montelukast (SINGULAIR) 10 MG tablet Take 1 tablet (10 mg total) by mouth at bedtime. 30 tablet 1  . nitrofurantoin (MACRODANTIN) 50 MG capsule Take 1 capsule (50 mg total) by mouth 4 (four) times daily. 28 capsule 0   No current facility-administered medications on file prior to visit.     BP 110/70 (BP Location: Left Arm, Patient Position: Sitting,  Cuff Size: Normal)   Pulse 87   Temp 98.4 F (36.9 C) (Oral)   Resp 20   Ht 5\' 5"  (1.651 m)   Wt 171 lb (77.6 kg)   SpO2 95%   BMI 28.46 kg/m        Objective:   Physical Exam  Constitutional: She is oriented to person, place, and time. She appears well-developed and well-nourished.  HENT:  Right Ear: Tympanic membrane normal.  Left Ear: Tympanic membrane normal.  Nose: Rhinorrhea present. Right sinus exhibits frontal sinus tenderness. Right sinus exhibits no maxillary sinus tenderness. Left sinus exhibits frontal sinus tenderness. Left sinus exhibits no maxillary sinus tenderness.  Mouth/Throat: Mucous membranes are normal. No oropharyngeal exudate or posterior oropharyngeal erythema.  Eyes: Pupils are equal, round, and reactive to light. No scleral icterus.  Neck: Neck supple.  Cardiovascular: Normal rate and regular rhythm.   Pulmonary/Chest: Effort normal and breath sounds normal. She has no wheezes. She has no rales.  Musculoskeletal: She exhibits no edema.  Lymphadenopathy:    She has no cervical adenopathy.  Neurological: She is alert and oriented to person, place, and time. Coordination normal.  Skin: Skin is warm and dry. No rash noted.          Assessment & Plan:  1. Acute respiratory infection Exam is reassuring; Symptoms have improved per patient; no use of albuterol; no SOB/DOE; Advised patient on supportive measures:  Get rest, drink plenty of fluids, and use tylenol as needed for pain. Follow up if fever >100, if symptoms worsen or if symptoms are not improved in 3 to 4  days. Patient verbalizes understanding.   2. Cough Mucinex for symptoms and benzonatate for cough as needed. - benzonatate (TESSALON) 100 MG capsule; Take 1 capsule (100 mg total) by mouth 3 (three) times daily.  Dispense: 20 capsule; Refill: 0  Delano Metz, FNP-C

## 2016-04-01 NOTE — Patient Instructions (Signed)
You may use Mucinex and tylenol or ibuprofen short term for symptoms.  Follow up in 3 to 4 days, worsen, or you develop a fever >100.   Upper Respiratory Infection, Adult Most upper respiratory infections (URIs) are caused by a virus. A URI affects the nose, throat, and upper air passages. The most common type of URI is often called "the common cold." Follow these instructions at home:  Take medicines only as told by your doctor.  Gargle warm saltwater or take cough drops to comfort your throat as told by your doctor.  Use a warm mist humidifier or inhale steam from a shower to increase air moisture. This may make it easier to breathe.  Drink enough fluid to keep your pee (urine) clear or pale yellow.  Eat soups and other clear broths.  Have a healthy diet.  Rest as needed.  Go back to work when your fever is gone or your doctor says it is okay.  You may need to stay home longer to avoid giving your URI to others.  You can also wear a face mask and wash your hands often to prevent spread of the virus.  Use your inhaler more if you have asthma.  Do not use any tobacco products, including cigarettes, chewing tobacco, or electronic cigarettes. If you need help quitting, ask your doctor. Contact a doctor if:  You are getting worse, not better.  Your symptoms are not helped by medicine.  You have chills.  You are getting more short of breath.  You have brown or red mucus.  You have yellow or brown discharge from your nose.  You have pain in your face, especially when you bend forward.  You have a fever.  You have puffy (swollen) neck glands.  You have pain while swallowing.  You have white areas in the back of your throat. Get help right away if:  You have very bad or constant:  Headache.  Ear pain.  Pain in your forehead, behind your eyes, and over your cheekbones (sinus pain).  Chest pain.  You have long-lasting (chronic) lung disease and any of the  following:  Wheezing.  Long-lasting cough.  Coughing up blood.  A change in your usual mucus.  You have a stiff neck.  You have changes in your:  Vision.  Hearing.  Thinking.  Mood. This information is not intended to replace advice given to you by your health care provider. Make sure you discuss any questions you have with your health care provider. Document Released: 07/26/2007 Document Revised: 10/10/2015 Document Reviewed: 05/14/2013 Elsevier Interactive Patient Education  2017 [employer].

## 2016-04-01 NOTE — Progress Notes (Signed)
Pre visit review using our clinic review tool, if applicable. No additional management support is needed unless otherwise documented below in the visit note. 

## 2016-06-06 ENCOUNTER — Encounter: Payer: Self-pay | Admitting: Internal Medicine

## 2016-06-06 ENCOUNTER — Ambulatory Visit (INDEPENDENT_AMBULATORY_CARE_PROVIDER_SITE_OTHER): Payer: Medicare Other | Admitting: Internal Medicine

## 2016-06-06 VITALS — BP 118/70 | HR 93 | Temp 98.4°F | Resp 16 | Ht 65.0 in | Wt 180.1 lb

## 2016-06-06 DIAGNOSIS — L03116 Cellulitis of left lower limb: Secondary | ICD-10-CM

## 2016-06-06 DIAGNOSIS — R059 Cough, unspecified: Secondary | ICD-10-CM | POA: Insufficient documentation

## 2016-06-06 DIAGNOSIS — B9789 Other viral agents as the cause of diseases classified elsewhere: Secondary | ICD-10-CM

## 2016-06-06 DIAGNOSIS — J069 Acute upper respiratory infection, unspecified: Secondary | ICD-10-CM | POA: Diagnosis not present

## 2016-06-06 DIAGNOSIS — R05 Cough: Secondary | ICD-10-CM | POA: Insufficient documentation

## 2016-06-06 LAB — POCT EXHALED NITRIC OXIDE: FENO LEVEL (PPB): 10

## 2016-06-06 MED ORDER — SULFAMETHOXAZOLE-TRIMETHOPRIM 800-160 MG PO TABS: 1.0000 | ORAL_TABLET | Freq: Two times a day (BID) | ORAL | 0 refills | Status: AC

## 2016-06-06 MED ORDER — HYDROCODONE-HOMATROPINE 5-1.5 MG/5ML PO SYRP: 5.0000 mL | ORAL_SOLUTION | Freq: Three times a day (TID) | ORAL | 0 refills | Status: DC | PRN

## 2016-06-06 MED ORDER — CEFDINIR 300 MG PO CAPS: 300.0000 mg | ORAL_CAPSULE | Freq: Two times a day (BID) | ORAL | 0 refills | Status: AC

## 2016-06-06 NOTE — Patient Instructions (Signed)
Cough, Adult Coughing is a reflex that clears your throat and your airways. Coughing helps to heal and protect your lungs. It is normal to cough occasionally, but a cough that happens with other symptoms or lasts a long time may be a sign of a condition that needs treatment. A cough may last only 2-3 weeks (acute), or it may last longer than 8 weeks (chronic). What are the causes? Coughing is commonly caused by:  Breathing in substances that irritate your lungs.  A viral or bacterial respiratory infection.  Allergies.  Asthma.  Postnasal drip.  Smoking.  Acid backing up from the stomach into the esophagus (gastroesophageal reflux).  Certain medicines.  Chronic lung problems, including COPD (or rarely, lung cancer).  Other medical conditions such as heart failure.  Follow these instructions at home: Pay attention to any changes in your symptoms. Take these actions to help with your discomfort:  Take medicines only as told by your health care provider. ? If you were prescribed an antibiotic medicine, take it as told by your health care provider. Do not stop taking the antibiotic even if you start to feel better. ? Talk with your health care provider before you take a cough suppressant medicine.  Drink enough fluid to keep your urine clear or pale yellow.  If the air is dry, use a cold steam vaporizer or humidifier in your bedroom or your home to help loosen secretions.  Avoid anything that causes you to cough at work or at home.  If your cough is worse at night, try sleeping in a semi-upright position.  Avoid cigarette smoke. If you smoke, quit smoking. If you need help quitting, ask your health care provider.  Avoid caffeine.  Avoid alcohol.  Rest as needed.  Contact a health care provider if:  You have new symptoms.  You cough up pus.  Your cough does not get better after 2-3 weeks, or your cough gets worse.  You cannot control your cough with suppressant  medicines and you are losing sleep.  You develop pain that is getting worse or pain that is not controlled with pain medicines.  You have a fever.  You have unexplained weight loss.  You have night sweats. Get help right away if:  You cough up blood.  You have difficulty breathing.  Your heartbeat is very fast. This information is not intended to replace advice given to you by your health care provider. Make sure you discuss any questions you have with your health care provider. Document Released: 08/05/2010 Document Revised: 07/15/2015 Document Reviewed: 04/15/2014 Elsevier Interactive Patient Education  2017 [employer]  

## 2016-06-06 NOTE — Progress Notes (Signed)
Subjective:  Patient ID: Jessica Farrell, female    DOB: 17-Apr-1945  Age: 71 y.o. MRN: [id number]  CC: Cough   HPI [name] presents for cough and LLE pain/redness/swelling for 10 days.  She bumped her left lower anterior extremity on something about 10 days ago and now has an isolated area of redness, pain, swelling over the anterior aspect of the lower leg. She has a history of cellulitis.  She also complains of a cough for 10 days that is productive of white phlegm. She denies wheezing/shortness of breath/chest pain/hemoptysis/fever/chills. She thinks she has a remote history of asthma.  Outpatient Medications Prior to Visit  Medication Sig Dispense Refill  . Albuterol Sulfate 108 (90 Base) MCG/ACT AEPB Inhale 2 puffs into the lungs every 4 (four) hours as needed (wheezing/SOB). Reported on 06/07/2015 1 each 0  . atorvastatin (LIPITOR) 10 MG tablet Take 1 tablet (10 mg total) by mouth daily. 90 tablet 1  . levothyroxine (SYNTHROID, LEVOTHROID) 125 MCG tablet Take 1 tablet (125 mcg total) by mouth daily before breakfast. 90 tablet 3  . metFORMIN (GLUCOPHAGE) 500 MG tablet Take 1 tablet (500 mg total) by mouth 2 (two) times daily with a meal. 180 tablet 1  . montelukast (SINGULAIR) 10 MG tablet Take 1 tablet (10 mg total) by mouth at bedtime. 30 tablet 1  . benzonatate (TESSALON) 100 MG capsule Take 1 capsule (100 mg total) by mouth 3 (three) times daily. 20 capsule 0  . nitrofurantoin (MACRODANTIN) 50 MG capsule Take 1 capsule (50 mg total) by mouth 4 (four) times daily. 28 capsule 0   No facility-administered medications prior to visit.     ROS Review of Systems  Constitutional: Negative for appetite change, chills, fatigue and unexpected weight change.  HENT: Negative.  Negative for sinus pressure and trouble swallowing.   Eyes: Negative for visual disturbance.  Respiratory: Positive for cough. Negative for chest tightness, shortness of breath, wheezing and stridor.     Cardiovascular: Negative for chest pain, palpitations and leg swelling.  Gastrointestinal: Negative for abdominal pain, constipation, diarrhea, nausea and vomiting.  Endocrine: Negative.   Genitourinary: Negative.   Musculoskeletal: Negative.  Negative for back pain, myalgias and neck pain.  Skin: Positive for color change. Negative for pallor, rash and wound.  Neurological: Negative.  Negative for dizziness, weakness and headaches.  Hematological: Negative.   Psychiatric/Behavioral: Negative.     Objective:  BP 118/70 (BP Location: Left Arm, Patient Position: Sitting, Cuff Size: Large)   Pulse 93   Temp 98.4 F (36.9 C) (Oral)   Resp 16   Ht 5\' 5"  (1.651 m)   Wt 180 lb 2 oz (81.7 kg)   SpO2 98%   BMI 29.97 kg/m   BP Readings from Last 3 Encounters:  06/06/16 118/70  04/01/16 110/70  02/18/16 136/72    Wt Readings from Last 3 Encounters:  06/06/16 180 lb 2 oz (81.7 kg)  04/01/16 171 lb (77.6 kg)  02/18/16 178 lb (80.7 kg)    Physical Exam  Constitutional: She is oriented to person, place, and time.  Non-toxic appearance. She does not have a sickly appearance. She does not appear ill. No distress.  HENT:  Mouth/Throat: Oropharynx is clear and moist. No oropharyngeal exudate.  Eyes: Conjunctivae are normal. Right eye exhibits no discharge. Left eye exhibits no discharge. No scleral icterus.  Neck: Normal range of motion. Neck supple. No JVD present. No tracheal deviation present. No thyromegaly present.  Cardiovascular: Normal rate, regular rhythm, normal  heart sounds and intact distal pulses.  Exam reveals no gallop and no friction rub.   No murmur heard. Pulmonary/Chest: Effort normal. No accessory muscle usage or stridor. No tachypnea. No respiratory distress. She has no decreased breath sounds. She has no wheezes. She has rhonchi in the right middle field and the left middle field. She has no rales. She exhibits no tenderness.  Abdominal: Soft. Bowel sounds are normal.  She exhibits no distension and no mass. There is no tenderness. There is no rebound and no guarding.  Musculoskeletal: Normal range of motion. She exhibits no edema, tenderness or deformity.       Legs: Lymphadenopathy:    She has no cervical adenopathy.  Neurological: She is oriented to person, place, and time.  Skin: Skin is warm. No rash noted. She is not diaphoretic. There is erythema. No pallor.  Vitals reviewed.   Lab Results  Component Value Date   WBC 8.5 01/20/2016   HGB 12.6 01/20/2016   HCT 38.7 01/20/2016   PLT 246 01/20/2016   GLUCOSE 148 (H) 01/20/2016   CHOL 143 09/15/2015   TRIG 135.0 09/15/2015   HDL 35.10 (L) 09/15/2015   LDLCALC 81 09/15/2015   ALT 22 01/20/2016   AST 20 01/20/2016   NA 139 01/20/2016   K 4.2 01/20/2016   CL 103 12/29/2015   CREATININE 0.8 01/20/2016   BUN 17.3 01/20/2016   CO2 26 01/20/2016   TSH 8.10 (H) 02/18/2016   INR 1.04 02/03/2014   HGBA1C 7.1 (H) 12/29/2015   MICROALBUR <0.7 09/15/2015    Ct Abdomen Pelvis W Contrast  Result Date: 05/31/2015 CLINICAL DATA:  Colon cancer. EXAM: CT ABDOMEN AND PELVIS WITH CONTRAST TECHNIQUE: Multidetector CT imaging of the abdomen and pelvis was performed using the standard protocol following bolus administration of intravenous contrast. CONTRAST:  152mL ISOVUE-300 IOPAMIDOL (ISOVUE-300) INJECTION 61% COMPARISON:  05/18/2014. FINDINGS: Lower chest: There may be mild subpleural fibrosis in the lung bases. Heart size normal. No pericardial or pleural effusion. Hepatobiliary: Liver and gallbladder are unremarkable. No biliary ductal dilatation. Pancreas: Negative. Spleen: Negative. Adrenals/Urinary Tract: Adrenal glands and kidneys are unremarkable. Ureters are decompressed. Bladder is unremarkable. Stomach/Bowel: Tiny hiatal hernia. Stomach is otherwise unremarkable. Duodenal diverticulum. Small bowel is otherwise unremarkable. Right hemicolectomy. Colon is otherwise unremarkable. Vascular/Lymphatic:  Scattered atherosclerotic calcification of the arterial vasculature without abdominal aortic aneurysm. No pathologically enlarged lymph nodes. Reproductive: Uterus and ovaries are visualized. Other: Midline ventral hernias contain fat. No free fluid. Mesenteries and peritoneum are otherwise unremarkable. Musculoskeletal: No worrisome lytic or sclerotic lesions. IMPRESSION: 1. No evidence of metastatic disease. 2. Question mild basilar subpleural pulmonary fibrosis. Electronically Signed   By: Lorin Picket M.D.   On: 05/31/2015 15:34    Assessment & Plan:   Aristea was seen today for cough.  Diagnoses and all orders for this visit:  Cellulitis of left anterior lower leg- will treat for strep and staph with Omnicef and Bactrim -     cefdinir (OMNICEF) 300 MG capsule; Take 1 capsule (300 mg total) by mouth 2 (two) times daily. -     sulfamethoxazole-trimethoprim (BACTRIM DS) 800-160 MG tablet; Take 1 tablet by mouth 2 (two) times daily.  Cough- she has a low FeNO score indicating that she does not have eosinophilic inflammation and I therefore do not recommend that she start a systemic or inhaled steroid. -     POCT EXHALED NITRIC OXIDE  Viral URI with cough- will offer symptom relief with Hycodan as needed. -  HYDROcodone-homatropine (HYCODAN) 5-1.5 MG/5ML syrup; Take 5 mLs by mouth every 8 (eight) hours as needed for cough.   I have discontinued Ms. Ornstein's nitrofurantoin and benzonatate. I am also having her start on cefdinir, sulfamethoxazole-trimethoprim, and HYDROcodone-homatropine. Additionally, I am having her maintain her Albuterol Sulfate, montelukast, metFORMIN, atorvastatin, and levothyroxine.  Meds ordered this encounter  Medications  . cefdinir (OMNICEF) 300 MG capsule    Sig: Take 1 capsule (300 mg total) by mouth 2 (two) times daily.    Dispense:  20 capsule    Refill:  0  . sulfamethoxazole-trimethoprim (BACTRIM DS) 800-160 MG tablet    Sig: Take 1 tablet by mouth  2 (two) times daily.    Dispense:  20 tablet    Refill:  0  . HYDROcodone-homatropine (HYCODAN) 5-1.5 MG/5ML syrup    Sig: Take 5 mLs by mouth every 8 (eight) hours as needed for cough.    Dispense:  120 mL    Refill:  0     Follow-up: Return in about 3 weeks (around 06/27/2016).  Scarlette Calico, MD

## 2016-06-06 NOTE — Progress Notes (Signed)
Pre visit review using our clinic review tool, if applicable. No additional management support is needed unless otherwise documented below in the visit note. 

## 2016-06-13 ENCOUNTER — Other Ambulatory Visit: Payer: Self-pay | Admitting: Internal Medicine

## 2016-06-13 ENCOUNTER — Other Ambulatory Visit: Payer: Self-pay | Admitting: Physician Assistant

## 2016-06-13 DIAGNOSIS — E1165 Type 2 diabetes mellitus with hyperglycemia: Secondary | ICD-10-CM

## 2016-06-28 ENCOUNTER — Ambulatory Visit (INDEPENDENT_AMBULATORY_CARE_PROVIDER_SITE_OTHER): Payer: Medicare Other | Admitting: Internal Medicine

## 2016-06-28 ENCOUNTER — Encounter: Payer: Self-pay | Admitting: Internal Medicine

## 2016-06-28 VITALS — BP 112/70 | HR 85 | Temp 98.0°F | Resp 16 | Ht 65.0 in | Wt 175.0 lb

## 2016-06-28 DIAGNOSIS — E038 Other specified hypothyroidism: Secondary | ICD-10-CM

## 2016-06-28 DIAGNOSIS — J4520 Mild intermittent asthma, uncomplicated: Secondary | ICD-10-CM

## 2016-06-28 DIAGNOSIS — E1165 Type 2 diabetes mellitus with hyperglycemia: Secondary | ICD-10-CM

## 2016-06-28 DIAGNOSIS — C7B8 Other secondary neuroendocrine tumors: Secondary | ICD-10-CM | POA: Diagnosis not present

## 2016-06-28 DIAGNOSIS — E785 Hyperlipidemia, unspecified: Secondary | ICD-10-CM

## 2016-06-28 MED ORDER — MONTELUKAST SODIUM 10 MG PO TABS: 10.0000 mg | ORAL_TABLET | Freq: Every day | ORAL | 3 refills | Status: DC

## 2016-06-28 MED ORDER — METFORMIN HCL 500 MG PO TABS: 500.0000 mg | ORAL_TABLET | Freq: Every day | ORAL | 1 refills | Status: DC

## 2016-06-28 MED ORDER — LEVOTHYROXINE SODIUM 112 MCG PO TABS: 112.0000 ug | ORAL_TABLET | Freq: Every day | ORAL | 0 refills | Status: DC

## 2016-06-28 MED ORDER — ATORVASTATIN CALCIUM 10 MG PO TABS: 10.0000 mg | ORAL_TABLET | Freq: Every day | ORAL | 3 refills | Status: DC

## 2016-06-28 NOTE — Assessment & Plan Note (Signed)
Continue statin. 

## 2016-06-28 NOTE — Progress Notes (Signed)
Subjective:    Patient ID: Jessica Farrell, female    DOB: Aug 27, 1945, 71 y.o.   MRN: [id number]  HPI The patient is here for follow up.  Hypothyroidism:  She is taking her medication daily, but for the past 2-3 weeks has been taking the wrong dose - she has been taking 112 mcg and should have been taking 125 mcg.  She denies any recent changes in energy or weight that are unexplained.  Her energy level is low.    Diabetes: She is taking her medication daily as prescribed. The metformin  Is causing diarrhea.  She is compliant with a diabetic diet. She is active, but not exercising regularly. She checks her feet daily and denies foot lesions. She is up-to-date with an ophthalmology examination.   Hyperlipidemia: She is taking her medication daily. She is compliant with a low fat/cholesterol diet. She is active, but not exercising regularly. She denies myalgias.   Asthma:  She has not taken taken the Singulair recently - needs a refill.  She does not take the inhaler much.  She does have intermittent cough or wheeze, but is not concerned about her asthma not being controlled.    Medications and allergies reviewed with patient and updated if appropriate.  Patient Active Problem List   Diagnosis Date Noted  . Cough 06/06/2016  . Dysuria 02/18/2016  . 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 ([place]) 02/18/2014  . Chronic venous insufficiency 08/02/2012  . HLD (hyperlipidemia) 05/15/2012  . Cellulitis of left anterior lower leg 05/05/2012  . Diabetes mellitus ([place]) 05/05/2012  . Hypothyroidism 05/05/2012  . Asthma 05/05/2012    Current Outpatient Prescriptions on File Prior to Visit  Medication Sig Dispense Refill  . Albuterol Sulfate 108 (90 Base) MCG/ACT AEPB Inhale 2 puffs into the lungs every 4 (four) hours as needed (wheezing/SOB). Reported on 06/07/2015 1 each 0  . metFORMIN (GLUCOPHAGE) 500 MG tablet Take 1 tablet (500 mg total) by mouth 2 (two) times daily with a  meal. 180 tablet 1   No current facility-administered medications on file prior to visit.     Past Medical History:  Diagnosis Date  . Asthma   . Cellulitis March  2014   Left foot and ankle  . colon ca dx'd 01/2014  . Colon polyps    2015   . Diabetes mellitus ([place]) 05/05/2012   Type II  . Hyperlipidemia   . Morbid obesity ([place])   . Thyroid disease 1990's   HypoThyroidism    Past Surgical History:  Procedure Laterality Date  . COLON RESECTION N/A 02/03/2014   Procedure: LAPAROSCOPIC ASSISTED BOWEL RESECTION;  Surgeon: Jackolyn Confer, MD;  Location: [facility];  Service: General;  Laterality: N/A;  . FRACTURE SURGERY  2000   Ankle  . TOOTH EXTRACTION     wisdom Teeth    Social History   Social History  . Marital status: Divorced    Spouse name: N/A  . Number of children: N/A  . Years of education: N/A   Social History Main Topics  . Smoking status: Never Smoker  . Smokeless tobacco: Never Used  . Alcohol use No  . Drug use: No  . Sexual activity: Not Currently    Birth control/ protection: Post-menopausal   Other Topics Concern  . None   Social History Narrative  . None    Family History  Problem Relation Age of Onset  . Alzheimer's disease Father   . Heart disease Mother   .  Arthritis Mother   . Hypertension Mother   . Alzheimer's disease Sister   . Cancer Sister 74    breast cancer   . Heart disease Brother     Heart Disease before age 52  . Colon cancer Neg Hx   . Esophageal cancer Neg Hx   . Rectal cancer Neg Hx   . Stomach cancer Neg Hx     Review of Systems  Constitutional: Negative for chills and fever.  Respiratory: Positive for cough and wheezing (occasionally). Negative for shortness of breath.   Cardiovascular: Negative for chest pain, palpitations and leg swelling.  Neurological: Positive for headaches (occasionally). Negative for light-headedness.       Objective:   Vitals:   06/28/16 1321  BP: 112/70  Pulse: 85  Resp: 16    Temp: 98 F (36.7 C)   Wt Readings from Last 3 Encounters:  06/28/16 175 lb (79.4 kg)  06/06/16 180 lb 2 oz (81.7 kg)  04/01/16 171 lb (77.6 kg)   Body mass index is 29.12 kg/m.   Physical Exam    Constitutional: Appears well-developed and well-nourished. No distress.  HENT:  Head: Normocephalic and atraumatic.  Neck: Neck supple. No tracheal deviation present. No thyromegaly present.  No cervical lymphadenopathy Cardiovascular: Normal rate, regular rhythm and normal heart sounds.   No murmur heard. No carotid bruit .  No edema Pulmonary/Chest: Effort normal and breath sounds normal. No respiratory distress. No has no wheezes. No rales.  Skin: Skin is warm and dry. Not diaphoretic.  Psychiatric: Normal mood and affect. Behavior is normal.      Assessment & Plan:    See Problem List for Assessment and Plan of chronic medical problems.

## 2016-06-28 NOTE — Patient Instructions (Addendum)
A paper prescription was given for labs -   Test(s) ordered today. Your results will be released to [facility] (or called to you) after review, usually within 72hours after test completion. If any changes need to be made, you will be notified at that same time.   Medications reviewed and updated.  Decrease metformin to once daily.    Your prescription(s) have been submitted to your pharmacy. Please take as directed and contact our office if you believe you are having problem(s) with the medication(s).   Please followup in 6 months

## 2016-06-28 NOTE — Assessment & Plan Note (Signed)
Metformin is causing diarrhea  -  will trying decreasing to one pill daily - if diarrhea persists will d/c completely Check a1c - if elevated may need a second medication since we are decreasing metformin Encouraged regular exercise Weight loss

## 2016-06-28 NOTE — Assessment & Plan Note (Signed)
Check tsh later this month so at least she has been on the same dose for 5-6 weeks - likely will need a higher dose Will adjust medication if needed

## 2016-06-28 NOTE — Assessment & Plan Note (Signed)
Following with oncology - up to date with visits

## 2016-06-28 NOTE — Assessment & Plan Note (Addendum)
Controlled Rarely uses inhaler Takes singulair - has not taken it recently Renew both Deferred maintenance inhaler Consider starting zyrtec for probable underlying allergies

## 2016-07-20 ENCOUNTER — Other Ambulatory Visit: Payer: Self-pay | Admitting: Internal Medicine

## 2016-07-20 ENCOUNTER — Telehealth: Payer: Self-pay | Admitting: Internal Medicine

## 2016-07-20 ENCOUNTER — Other Ambulatory Visit (HOSPITAL_BASED_OUTPATIENT_CLINIC_OR_DEPARTMENT_OTHER): Payer: Medicare Other

## 2016-07-20 ENCOUNTER — Other Ambulatory Visit: Payer: Self-pay | Admitting: Hematology

## 2016-07-20 DIAGNOSIS — C7A012 Malignant carcinoid tumor of the ileum: Secondary | ICD-10-CM | POA: Diagnosis present

## 2016-07-20 DIAGNOSIS — E1149 Type 2 diabetes mellitus with other diabetic neurological complication: Secondary | ICD-10-CM | POA: Diagnosis not present

## 2016-07-20 DIAGNOSIS — E039 Hypothyroidism, unspecified: Secondary | ICD-10-CM | POA: Diagnosis not present

## 2016-07-20 DIAGNOSIS — C7B8 Other secondary neuroendocrine tumors: Secondary | ICD-10-CM | POA: Diagnosis not present

## 2016-07-20 LAB — CBC WITH DIFFERENTIAL/PLATELET
BASO%: 0.8 % (ref 0.0–2.0)
Basophils Absolute: 0.1 10*3/uL (ref 0.0–0.1)
EOS%: 2.2 % (ref 0.0–7.0)
Eosinophils Absolute: 0.2 10*3/uL (ref 0.0–0.5)
HCT: 37.9 % (ref 34.8–46.6)
HEMOGLOBIN: 12.5 g/dL (ref 11.6–15.9)
LYMPH#: 3 10*3/uL (ref 0.9–3.3)
LYMPH%: 33.9 % (ref 14.0–49.7)
MCH: 29 pg (ref 25.1–34.0)
MCHC: 33 g/dL (ref 31.5–36.0)
MCV: 87.9 fL (ref 79.5–101.0)
MONO#: 0.7 10*3/uL (ref 0.1–0.9)
MONO%: 8.3 % (ref 0.0–14.0)
NEUT#: 4.8 10*3/uL (ref 1.5–6.5)
NEUT%: 54.8 % (ref 38.4–76.8)
Platelets: 213 10*3/uL (ref 145–400)
RBC: 4.31 10*6/uL (ref 3.70–5.45)
RDW: 13 % (ref 11.2–14.5)
WBC: 8.7 10*3/uL (ref 3.9–10.3)
nRBC: 0 % (ref 0–0)

## 2016-07-20 LAB — COMPREHENSIVE METABOLIC PANEL
ALBUMIN: 3.5 g/dL (ref 3.5–5.0)
ALK PHOS: 156 U/L — AB (ref 40–150)
ALT: 14 U/L (ref 0–55)
AST: 15 U/L (ref 5–34)
Anion Gap: 10 mEq/L (ref 3–11)
BUN: 12.9 mg/dL (ref 7.0–26.0)
CO2: 23 mEq/L (ref 22–29)
Calcium: 9.2 mg/dL (ref 8.4–10.4)
Chloride: 105 mEq/L (ref 98–109)
Creatinine: 0.8 mg/dL (ref 0.6–1.1)
EGFR: 74 mL/min/{1.73_m2} — AB (ref >90)
GLUCOSE: 118 mg/dL (ref 70–140)
POTASSIUM: 4.1 meq/L (ref 3.5–5.1)
SODIUM: 139 meq/L (ref 136–145)
Total Bilirubin: 0.32 mg/dL (ref 0.20–1.20)
Total Protein: 7.1 g/dL (ref 6.4–8.3)

## 2016-07-20 NOTE — Telephone Encounter (Signed)
Ideally should be fasting - she can come to our lab or if they can do it some other day that would be great --- there is no rush.

## 2016-07-20 NOTE — Telephone Encounter (Signed)
Called stating [facility] wanted a lipid panel on this Pt, they did the blood work but the Pt was not fasting, they want to know if [facility] wants this or should they get the blood work again on another day.

## 2016-07-21 NOTE — Telephone Encounter (Signed)
FYI: spoke with lab tech today, stated they had already sent the test over even though the pt wasn't fasting.

## 2016-07-22 NOTE — Telephone Encounter (Signed)
Will review and determine if further testing is needed

## 2016-07-24 LAB — CHROMOGRANIN A: Chromogranin A: 1 nmol/L (ref 0–5)

## 2016-07-25 LAB — SPECIMEN STATUS REPORT

## 2016-07-25 LAB — LIPID PANEL W/O CHOL/HDL RATIO
Cholesterol, Total: 133 mg/dL (ref 100–199)
HDL: 37 mg/dL — ABNORMAL LOW
LDL Calculated: 55 mg/dL (ref 0–99)
Triglycerides: 204 mg/dL — ABNORMAL HIGH (ref 0–149)
VLDL Cholesterol Cal: 41 mg/dL — ABNORMAL HIGH (ref 5–40)

## 2016-07-25 LAB — TSH: TSH: 3.91 u[IU]/mL (ref 0.450–4.500)

## 2016-07-25 LAB — HGB A1C W/O EAG: Hgb A1c MFr Bld: 6.5 % — ABNORMAL HIGH (ref 4.8–5.6)

## 2016-07-26 DIAGNOSIS — C7B8 Other secondary neuroendocrine tumors: Secondary | ICD-10-CM | POA: Diagnosis not present

## 2016-07-26 NOTE — Progress Notes (Signed)
[place]  [phone] [phone]) [phone]  Clinic Follow up Note   Patient Care Team: Jessica Rail, MD as PCP - General (Internal Medicine) Jessica Merle, MD as Consulting Physician (Hematology) Jessica Confer, MD as Consulting Physician (General Surgery) 07/27/2016  CHIEF COMPLAINTS:  Follow up carcinoid tumor    Oncology History   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   Staging form: Colon and Rectum, AJCC 7th Edition     Clinical: T3, N2, M0 - Unsigned       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 ([employer])   01/21/2014 Imaging    CT: Distal small bowel obstruction due to 3.4 cm soft tissue mass near the ileocecal valve, with adjacent right lower quadrant mesenteric lymphadenopathy      02/03/2014 Pathologic Stage    invasive well diff neuroendocrine tumor (carcinoid), 3cm, pT3pN2 (5/25 nodes positive). negative margines.      02/03/2014 Surgery    Laparoscopic-assisted right colectomy and resection of distal ileum      02/03/2014 Initial Diagnosis    Metastatic malignant neuroendocrine tumor of the ileocecal valve to regional lymph nodes.       05/31/2015 Imaging    CT A/P w contrast  IMPRESSION: 1. No evidence of metastatic disease. 2. Question mild basilar subpleural pulmonary fibrosis.       HISTORY OF PRESENTING ILLNESS: Jessica Farrell is very nice 71 year old white female who was referred by GI Dr. Zella Farrell for newly diagnosed small intestine carcinoid tumor.   She presented with 5-6 week history of mid and generalized abdominal discomfort with episodes of sharp intense abdominal pain, intermittent nausea and vomiting and diarrhea with loose stools generally postprandially. She was referred to GI Dr. Lillia Farrell who ordered a CT of the abdomen which was done on 01/21/2014. It shows a 2.6 x 3.4 cm soft tissue mass at the ileocecal valve there is mild adjacent adenopathy. There is also moderate dilation  of the distal small bowel loops. Unclear whether this represents an adenocarcinoma or possibly a carcinoid. She underwent Colonoscopy 01/28/2014 and a circumferential mass was found within the ileocecal valve and partially obstructing the lumen ,multiple biopsies were performed which came back negative for malignancy.   She presented to ED with abdominal pain, nausea and vomiting on 02/03/2014 and was admitted for SBO, and underwent Laparoscopic-assisted right colectomy and resection of distal ileum on 02/03/2014 by Dr. Zella Farrell .   She has recovered well after surgery. The incision related pain is near resolved, still has small discharge form the ssurgical wound. She eats well, she has been having diarrhea since the surgery, improved, 3 loose BM daily now. She lost about 50 lbs in the past 2 years, which she contributes mainly to her diet control for DM.   CURRENT THERAPY: Observation   INTERIM HISTORY: Jessica Farrell returns for follow-up. She has been doing well. She has diarrhea that has worsened after her bowel surgery. She goes 3-4 times a day which is loose. She believes her Metformin is causing her diarrhea. She sometimes has flushing symptoms that could be related to the weather.  MEDICAL HISTORY:  Past Medical History:  Diagnosis Date  . Asthma   . Cellulitis March  2014   Left foot and ankle  . colon ca dx'd 01/2014  . Colon polyps    2015   . Diabetes mellitus ([place]) 05/05/2012   Type II  . Hyperlipidemia   . Morbid obesity ([place])   . Thyroid disease 1990's   HypoThyroidism  SURGICAL HISTORY: Past Surgical History:  Procedure Laterality Date  . COLON RESECTION N/A 02/03/2014   Procedure: LAPAROSCOPIC ASSISTED BOWEL RESECTION;  Surgeon: Jessica Confer, MD;  Location: [facility];  Service: General;  Laterality: N/A;  . FRACTURE SURGERY  2000   Ankle  . TOOTH EXTRACTION     wisdom Teeth    SOCIAL HISTORY: History   Social History  . Marital Status: Divorced    Spouse Name:  N/A    Number of Children: 2  . Years of Education: N/A   Occupational History  . Secretary, still work part time    Social History Main Topics  . Smoking status: Never Smoker   . Smokeless tobacco: Never Used  . Alcohol Use: No  . Drug Use: No  . Sexual Activity: Not Currently    Birth Control/ Protection: Post-menopausal   Other Topics Concern  . Not on file   Social History Narrative    FAMILY HISTORY: Family History  Problem Relation Age of Onset  . Alzheimer's disease Father   . Heart disease Mother   . Arthritis Mother   . Hypertension Mother   . Alzheimer's disease Sister   . Cancer Sister 1       breast cancer   . Heart disease Brother        Heart Disease before age 83  . Colon cancer Neg Hx   . Esophageal cancer Neg Hx   . Rectal cancer Neg Hx   . Stomach cancer Neg Hx     ALLERGIES:  is allergic to penicillins.  MEDICATIONS:  Current Outpatient Prescriptions  Medication Sig Dispense Refill  . Albuterol Sulfate 108 (90 Base) MCG/ACT AEPB Inhale 2 puffs into the lungs every 4 (four) hours as needed (wheezing/SOB). Reported on 06/07/2015 1 each 0  . atorvastatin (LIPITOR) 10 MG tablet Take 1 tablet (10 mg total) by mouth daily. 90 tablet 3  . levothyroxine (SYNTHROID, LEVOTHROID) 112 MCG tablet Take 1 tablet (112 mcg total) by mouth daily. Yearly physical w/labs due in July must see MD for refills 90 tablet 0  . metFORMIN (GLUCOPHAGE) 500 MG tablet Take 1 tablet (500 mg total) by mouth daily with breakfast. 90 tablet 1  . montelukast (SINGULAIR) 10 MG tablet Take 1 tablet (10 mg total) by mouth at bedtime. 90 tablet 3   No current facility-administered medications for this visit.     REVIEW OF SYSTEMS:   Constitutional: Denies fevers, chills or abnormal night sweats Eyes: Denies blurriness of vision, double vision or watery eyes Ears, nose, mouth, throat, and face: Denies mucositis or sore throat Respiratory: Denies cough, dyspnea or  wheezes Cardiovascular: Denies palpitation, chest discomfort or lower extremity swelling Gastrointestinal:  Denies nausea, heartburn   (+)diarrhea, loose 3--4 times daily Skin: Denies abnormal skin rashes Lymphatics: Denies new lymphadenopathy or easy bruising Neurological:Denies numbness, tingling or new weaknesses Behavioral/Psych: Mood is stable, no new changes  All other systems were reviewed with the patient and are negative.  PHYSICAL EXAMINATION: ECOG PERFORMANCE STATUS: 0 - Asymptomatic  Vitals:   07/27/16 1420  BP: 127/68  Pulse: (!) 57  Resp: 18  Temp: 98.1 F (36.7 C)   Filed Weights   07/27/16 1420  Weight: 175 lb 8 oz (79.6 kg)    GENERAL:alert, no distress and comfortable SKIN: skin color, texture, turgor are normal, no rashes or significant lesions EYES: normal, conjunctiva are pink and non-injected, sclera clear OROPHARYNX:no exudate, no erythema and lips, buccal mucosa, and tongue normal  NECK:  supple, thyroid normal size, non-tender, without nodularity LYMPH:  no palpable lymphadenopathy in the cervical, axillary or inguinal LUNGS: clear to auscultation and percussion with normal breathing effort HEART: regular rate & rhythm and no murmurs and no lower extremity edema ABDOMEN:abdomen soft, non-tender and normal bowel sounds Musculoskeletal:no cyanosis of digits and no clubbing  PSYCH: alert & oriented x 3 with fluent speech NEURO: no focal motor/sensory deficits  LABORATORY DATA:  I have reviewed the data as listed Lab Results  Component Value Date   WBC 8.7 07/20/2016   HGB 12.5 07/20/2016   HCT 37.9 07/20/2016   MCV 87.9 07/20/2016   PLT 213 07/20/2016    Recent Labs  09/15/15 1054 12/29/15 1543 01/20/16 1329 07/20/16 1448  NA 140 139 139 139  K 4.2 4.4 4.2 4.1  CL 106 103  --   --   CO2 25 31 26 23   GLUCOSE 137* 142* 148* 118  BUN 9 16 17.3 12.9  CREATININE 0.76 0.81 0.8 0.8  CALCIUM 9.1 9.7 9.2 9.2  PROT 7.3 7.4 7.3 7.1  ALBUMIN  3.8 4.1 3.4* 3.5  AST 14 15 20 15   ALT 16 13 22 14   ALKPHOS 134* 108 152* 156*  BILITOT 0.5 0.3 0.31 0.32    Chromogranin A  Order: [id number]  Status:  Final result Visible to patient:  No (Not Released) Next appt:  Today at 02:15 PM in Oncology [facility], Krista Blue, MD) Dx:  Metastatic malignant neuroendocrine t...    Ref Range & Units 7d ago 29mo ago   Chromogranin A 0 - 5 nmol/L 2  2CM          PATHOLOGY REPORT  Small intestine, resection for tumor, ascending 02/03/2014 - INVASIVE WELL DIFFERENTIATED NEUROENDOCRINE TUMOR (CARCINOID), SPANNING 3 CM IN GREATEST DIMENSION. - TUMOR INVADES MUSCULARIS PROPRIA TO INVOLVE SUBSEROSAL SOFT TISSUES. - MARGINS ARE NEGATIVE. - FIVE OF TWENTY-FIVE LYMPH NODES ARE POSITIVE FOR METASTATIC LOW GRADE NEUROENDOCRINE TUMOR (5/25). - EXTRACAPSULAR EXTENSION IS IDENTIFIED. - SEE ONCOLOGY TEMPLATE. Specimen: Portion of terminal ileum with attached proximal portion of right colon. Procedure: Laparoscopic assisted right colectomy and resection of distal ileum. Tumor Site: Distal terminal ileum, 3 cm proximal to ileocecal valve. Tumor Size: 3 cm in greatest dimension. Tumor Focality: Unifocal. Histologic Type: Well differentiated neuroendocrine tumor (carcinoid tumor) . Histologic Grade : G1: Low grade. Mitotic Rate low mitotic rate, less than 1/10 mitoses per hpf (high power field). Microscopic Tumor Extension: Tumor invades through muscularis propria to involve subserosal soft tissues, Margins: Proximal Margin: Negative. Distal Margin: Negative. Mesenteric (Radial) Margin : Negative. All margins uninvolved by neuroendocrine tumor: Distance from closest margin: 4.5 cm Margin: Mesenteric margin. Lymph-Vascular Invasion: Tumor is seen involving a subcapsular sinus space (lymphatic space) in one of the lymph nodes. Perineural Invasion: Not identified. Lymph nodes: number examined - 25; number positive: 5. TNM Staging: pT3, pN1.   RADIOGRAPHIC  STUDIES: I have personally reviewed the radiological images as listed and agreed with the findings in the report.  CT abdomen and pelvis 05/31/2015 IMPRESSION: 1. No evidence of metastatic disease. 2. Question mild basilar subpleural pulmonary fibrosis.   ASSESSMENT & PLAN:  71 y.o.  female with newly diagnosed carcinoid tumor at the terminal ileum near the ileumcecal valve.  1. 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 Surgical margins were negative. -I previously discussed her diagnosis and the staging with her in great details.  -There is  no role of adjuvant chemotherapy for resected carcinoid tumor.  -We previously discussed the risks of tumor recurrence in the future, including late recurrence. -Her postop chromogranin A and urine 5 HIAA level were normal. Those were not checked before surgery. -I peviously recommend a total of 10 years surveillance, with office visit, lab every 6 months for the first 2 years, then every 6-12 months afterwards. CT scans can be considered for surveillance, per [place] and guideline. -I previously discussed her restaging CT scan on 05/31/2015, which showed no evidence of recurrence. -Lab results from last week reviewed with her, CBC, CMP and chromogranin A levels are normal. Alkaline phosphate slightly elevated. -Patient is clinically doing very well, asymptomatic, exam was unremarkable, no clinical concern of recurrence. -I recommend continue annual surveillance CT scan. However her insurance does not pay for CT scans. She only wants to do them if she absolutely has to.  -Continue clinical surveillance and follow-up.  2. HTN, DM, hypothyroidism -She will continue medication and follow-up with her primary care physician.   Plan: Continue clinical surveillance. Return to clinic in 6 months with lab one week before   All questions were answered.  The patient knows to call the clinic with any problems, questions or concerns. I spent 15 minutes counseling the patient face to face. The total time spent in the appointment was 20 minutes and more than 50% was on counseling.  This document serves as a record of services personally performed by Jessica Merle, MD. It was created on her behalf by Brandt Loosen, a trained medical scribe. The creation of this record is based on the scribe's personal observations and the provider's statements to them. This document has been checked and approved by the attending provider.     Jessica Merle, MD 07/27/2016

## 2016-07-27 ENCOUNTER — Ambulatory Visit (HOSPITAL_BASED_OUTPATIENT_CLINIC_OR_DEPARTMENT_OTHER): Payer: Medicare Other | Admitting: Hematology

## 2016-07-27 ENCOUNTER — Telehealth: Payer: Self-pay | Admitting: Hematology

## 2016-07-27 ENCOUNTER — Encounter: Payer: Self-pay | Admitting: Internal Medicine

## 2016-07-27 ENCOUNTER — Encounter: Payer: Self-pay | Admitting: Hematology

## 2016-07-27 VITALS — BP 127/68 | HR 57 | Temp 98.1°F | Resp 18 | Ht 65.0 in | Wt 175.5 lb

## 2016-07-27 DIAGNOSIS — Z85060 Personal history of malignant carcinoid tumor of small intestine: Secondary | ICD-10-CM

## 2016-07-27 DIAGNOSIS — E119 Type 2 diabetes mellitus without complications: Secondary | ICD-10-CM

## 2016-07-27 DIAGNOSIS — I10 Essential (primary) hypertension: Secondary | ICD-10-CM | POA: Diagnosis not present

## 2016-07-27 DIAGNOSIS — C7B8 Other secondary neuroendocrine tumors: Secondary | ICD-10-CM

## 2016-07-27 DIAGNOSIS — E039 Hypothyroidism, unspecified: Secondary | ICD-10-CM | POA: Diagnosis not present

## 2016-07-27 NOTE — Telephone Encounter (Signed)
Gave patient AVS and calender per 6/7 los. Lab and f/u in 6 months.

## 2016-07-28 LAB — 5 HIAA, QUANTITATIVE, URINE, 24 HOUR
5-HIAA, URINE: 3 mg/L
5-HIAA,QUANT.,24 HR URINE: 2.7 mg/(24.h) (ref 0.0–14.9)

## 2016-07-30 ENCOUNTER — Encounter: Payer: Self-pay | Admitting: Hematology

## 2016-08-03 ENCOUNTER — Other Ambulatory Visit: Payer: Self-pay | Admitting: Internal Medicine

## 2016-08-03 DIAGNOSIS — E1165 Type 2 diabetes mellitus with hyperglycemia: Secondary | ICD-10-CM

## 2016-09-08 ENCOUNTER — Other Ambulatory Visit: Payer: Self-pay | Admitting: Internal Medicine

## 2016-12-28 NOTE — Patient Instructions (Addendum)
Have blood work done at oncology.   Test(s) ordered today. Your results will be released to [facility] (or called to you) after review, usually within 72hours after test completion. If any changes need to be made, you will be notified at that same time.  All other Health Maintenance issues reviewed.   All recommended immunizations and age-appropriate screenings are up-to-date or discussed.  Flu immunization administered today.   Medications reviewed and updated.  Changes include starting symbicort inhaler for your asthma.  Please followup in 6 months

## 2016-12-28 NOTE — Progress Notes (Signed)
Subjective:    Patient ID: Jessica Farrell, female    DOB: [birth year], 71 y.o.   MRN: [id number]  HPI The patient is here for follow up.  Diabetes: She is taking her medication daily as prescribed. She is compliant with a diabetic diet. She is active, but not exercising regularly.  She checks her feet daily and denies foot lesions. She is up-to-date with an ophthalmology examination.   Hyperlipidemia: She is taking her medication daily. She is compliant with a low fat/cholesterol diet. She is active, but not exercising regularly. She denies myalgias.   Hypothyroidism:  She is taking her medication daily.  She denies any recent changes in energy or weight that are unexplained.   Asthma:  She has a cough.  She uses the albuterol inhaler on occasion, but feels she should use it more.  She has wheezing at night, most nights.  She denies SOB.    Pulled muscle in her back:  She thinks she did this last week.  It is a lot better.    Medications and allergies reviewed with patient and updated if appropriate.  Patient Active Problem List   Diagnosis Date Noted  . Cough 06/06/2016  . Dysuria 02/18/2016  . 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 ([place]) 02/18/2014  . Chronic venous insufficiency 08/02/2012  . HLD (hyperlipidemia) 05/15/2012  . Cellulitis of left anterior lower leg 05/05/2012  . Diabetes mellitus ([place]) 05/05/2012  . Hypothyroidism 05/05/2012  . Asthma 05/05/2012    Current Outpatient Medications on File Prior to Visit  Medication Sig Dispense Refill  . Albuterol Sulfate 108 (90 Base) MCG/ACT AEPB Inhale 2 puffs into the lungs every 4 (four) hours as needed (wheezing/SOB). Reported on 06/07/2015 1 each 0  . atorvastatin (LIPITOR) 10 MG tablet Take 1 tablet (10 mg total) by mouth daily. 90 tablet 3  . levothyroxine (SYNTHROID, LEVOTHROID) 112 MCG tablet TAKE 1 TABLET BY MOUTH ONCE DAILY 90 tablet 1  . metFORMIN (GLUCOPHAGE) 500 MG tablet Take 1 tablet (500 mg  total) by mouth daily with breakfast. 90 tablet 1  . montelukast (SINGULAIR) 10 MG tablet Take 1 tablet (10 mg total) by mouth at bedtime. 90 tablet 3   No current facility-administered medications on file prior to visit.     Past Medical History:  Diagnosis Date  . Asthma   . Cellulitis March  2014   Left foot and ankle  . colon ca dx'd 01/2014  . Colon polyps    2015   . Diabetes mellitus ([place]) 05/05/2012   Type II  . Hyperlipidemia   . Morbid obesity ([place])   . Thyroid disease 1990's   HypoThyroidism    Past Surgical History:  Procedure Laterality Date  . FRACTURE SURGERY  2000   Ankle  . TOOTH EXTRACTION     wisdom Teeth    Social History   Socioeconomic History  . Marital status: Divorced    Spouse name: None  . Number of children: None  . Years of education: None  . Highest education level: None  Social Needs  . Financial resource strain: None  . Food insecurity - worry: None  . Food insecurity - inability: None  . Transportation needs - medical: None  . Transportation needs - non-medical: None  Occupational History  . None  Tobacco Use  . Smoking status: Never Smoker  . Smokeless tobacco: Never Used  Substance and Sexual Activity  . Alcohol use: No  . Drug use: No  .  Sexual activity: Not Currently    Birth control/protection: Post-menopausal  Other Topics Concern  . None  Social History Narrative  . None    Family History  Problem Relation Age of Onset  . Alzheimer's disease Father   . Heart disease Mother   . Arthritis Mother   . Hypertension Mother   . Alzheimer's disease Sister   . Cancer Sister 22       breast cancer   . Heart disease Brother        Heart Disease before age 29  . Colon cancer Neg Hx   . Esophageal cancer Neg Hx   . Rectal cancer Neg Hx   . Stomach cancer Neg Hx     Review of Systems  Constitutional: Negative for chills and fever.  Respiratory: Positive for cough and wheezing. Negative for shortness of breath.     Cardiovascular: Positive for leg swelling (rare). Negative for chest pain and palpitations.  Neurological: Positive for headaches (occ). Negative for light-headedness.       Objective:   Vitals:   12/29/16 1335  BP: 122/68  Pulse: 77  Temp: 97.8 F (36.6 C)  SpO2: 99%   Wt Readings from Last 3 Encounters:  12/29/16 175 lb (79.4 kg)  07/27/16 175 lb 8 oz (79.6 kg)  06/28/16 175 lb (79.4 kg)   Body mass index is 29.12 kg/m.   Physical Exam    Constitutional: Appears well-developed and well-nourished. No distress.  HENT:  Head: Normocephalic and atraumatic.  Neck: Neck supple. No tracheal deviation present. No thyromegaly present.  No cervical lymphadenopathy Cardiovascular: Normal rate, regular rhythm and normal heart sounds.   No murmur heard. No carotid bruit .  No edema Pulmonary/Chest: Effort normal and breath sounds normal. No respiratory distress. Mild wheezes. No rales.  Skin: Skin is warm and dry. Not diaphoretic.  Psychiatric: Normal mood and affect. Behavior is normal.      Assessment & Plan:    See Problem List for Assessment and Plan of chronic medical problems.

## 2016-12-29 ENCOUNTER — Encounter: Payer: Self-pay | Admitting: Internal Medicine

## 2016-12-29 ENCOUNTER — Ambulatory Visit (INDEPENDENT_AMBULATORY_CARE_PROVIDER_SITE_OTHER): Payer: Medicare Other | Admitting: Internal Medicine

## 2016-12-29 VITALS — BP 122/68 | HR 77 | Temp 97.8°F | Ht 65.0 in | Wt 175.0 lb

## 2016-12-29 DIAGNOSIS — E1165 Type 2 diabetes mellitus with hyperglycemia: Secondary | ICD-10-CM

## 2016-12-29 DIAGNOSIS — E038 Other specified hypothyroidism: Secondary | ICD-10-CM | POA: Diagnosis not present

## 2016-12-29 DIAGNOSIS — Z23 Encounter for immunization: Secondary | ICD-10-CM

## 2016-12-29 DIAGNOSIS — E785 Hyperlipidemia, unspecified: Secondary | ICD-10-CM

## 2016-12-29 DIAGNOSIS — J4520 Mild intermittent asthma, uncomplicated: Secondary | ICD-10-CM | POA: Diagnosis not present

## 2016-12-29 MED ORDER — BUDESONIDE-FORMOTEROL FUMARATE 80-4.5 MCG/ACT IN AERO: 2.0000 | INHALATION_SPRAY | Freq: Two times a day (BID) | RESPIRATORY_TRACT | 3 refills | Status: DC

## 2016-12-29 NOTE — Assessment & Plan Note (Signed)
Not controlled Having chronic cough and daily wheeze Start symbicort twice daily Albuterol as needed

## 2016-12-29 NOTE — Assessment & Plan Note (Signed)
Check tsh  Titrate med dose if needed  

## 2016-12-29 NOTE — Assessment & Plan Note (Signed)
Continue statin Last lipid panel in may - controlled, except trigs

## 2016-12-29 NOTE — Assessment & Plan Note (Signed)
Check a1c, urine micro Low sugar / carb diet Stressed regular exercise 

## 2017-01-05 ENCOUNTER — Telehealth: Payer: Self-pay | Admitting: Internal Medicine

## 2017-01-05 MED ORDER — UMECLIDINIUM-VILANTEROL 62.5-25 MCG/INH IN AEPB: 1.0000 | INHALATION_SPRAY | Freq: Every day | RESPIRATORY_TRACT | 5 refills | Status: DC

## 2017-01-05 NOTE — Telephone Encounter (Signed)
Pt called and her insurance will not cover budesonide-formoterol (SYMBICORT) 80-4.5 MCG/ACT inhaler  She would like an alternative sent in, please advise

## 2017-01-05 NOTE — Telephone Encounter (Signed)
Alternative sent

## 2017-01-19 ENCOUNTER — Other Ambulatory Visit: Payer: Self-pay | Admitting: Internal Medicine

## 2017-01-19 ENCOUNTER — Other Ambulatory Visit (HOSPITAL_BASED_OUTPATIENT_CLINIC_OR_DEPARTMENT_OTHER): Payer: Medicare Other

## 2017-01-19 DIAGNOSIS — Z85060 Personal history of malignant carcinoid tumor of small intestine: Secondary | ICD-10-CM

## 2017-01-19 DIAGNOSIS — E038 Other specified hypothyroidism: Secondary | ICD-10-CM | POA: Diagnosis not present

## 2017-01-19 DIAGNOSIS — E1165 Type 2 diabetes mellitus with hyperglycemia: Secondary | ICD-10-CM | POA: Diagnosis not present

## 2017-01-19 DIAGNOSIS — C7B09 Secondary carcinoid tumors of other sites: Secondary | ICD-10-CM | POA: Diagnosis not present

## 2017-01-19 DIAGNOSIS — C7B8 Other secondary neuroendocrine tumors: Secondary | ICD-10-CM

## 2017-01-19 DIAGNOSIS — E785 Hyperlipidemia, unspecified: Secondary | ICD-10-CM | POA: Diagnosis not present

## 2017-01-19 LAB — COMPREHENSIVE METABOLIC PANEL
ALT: 22 U/L (ref 0–55)
AST: 19 U/L (ref 5–34)
Albumin: 3.6 g/dL (ref 3.5–5.0)
Alkaline Phosphatase: 148 U/L (ref 40–150)
Anion Gap: 8 mEq/L (ref 3–11)
BUN: 13 mg/dL (ref 7.0–26.0)
CALCIUM: 9.3 mg/dL (ref 8.4–10.4)
CHLORIDE: 106 meq/L (ref 98–109)
CO2: 26 mEq/L (ref 22–29)
Creatinine: 0.8 mg/dL (ref 0.6–1.1)
EGFR: >60 mL/min/{1.73_m2} (ref >60)
Glucose: 90 mg/dl (ref 70–140)
POTASSIUM: 4.1 meq/L (ref 3.5–5.1)
SODIUM: 140 meq/L (ref 136–145)
Total Bilirubin: 0.27 mg/dL (ref 0.20–1.20)
Total Protein: 7.4 g/dL (ref 6.4–8.3)

## 2017-01-19 LAB — CBC WITH DIFFERENTIAL/PLATELET
BASO%: 0.6 % (ref 0.0–2.0)
BASOS ABS: 0.1 10*3/uL (ref 0.0–0.1)
EOS%: 2.7 % (ref 0.0–7.0)
Eosinophils Absolute: 0.2 10*3/uL (ref 0.0–0.5)
HEMATOCRIT: 39.6 % (ref 34.8–46.6)
HGB: 13 g/dL (ref 11.6–15.9)
LYMPH%: 33.9 % (ref 14.0–49.7)
MCH: 29.1 pg (ref 25.1–34.0)
MCHC: 32.8 g/dL (ref 31.5–36.0)
MCV: 88.8 fL (ref 79.5–101.0)
MONO#: 0.6 10*3/uL (ref 0.1–0.9)
MONO%: 6.7 % (ref 0.0–14.0)
NEUT#: 4.6 10*3/uL (ref 1.5–6.5)
NEUT%: 56.1 % (ref 38.4–76.8)
Platelets: 249 10*3/uL (ref 145–400)
RBC: 4.46 10*6/uL (ref 3.70–5.45)
RDW: 13.4 % (ref 11.2–14.5)
WBC: 8.2 10*3/uL (ref 3.9–10.3)
lymph#: 2.8 10*3/uL (ref 0.9–3.3)

## 2017-01-22 LAB — CHROMOGRANIN A: CHROMOGRAN A: 2 nmol/L (ref 0–5)

## 2017-01-24 LAB — SPECIMEN STATUS REPORT

## 2017-01-24 LAB — MICROALBUMIN / CREATININE URINE RATIO
Creatinine, Urine: 17.6 mg/dL
Microalb/Creat Ratio: <17 mg/g creat (ref 0.0–30.0)

## 2017-01-24 LAB — COMPREHENSIVE METABOLIC PANEL
ALBUMIN: 4 g/dL (ref 3.5–4.8)
ALT: 21 IU/L (ref 0–32)
AST: 19 IU/L (ref 0–40)
Albumin/Globulin Ratio: 1.3 (ref 1.2–2.2)
Alkaline Phosphatase: 157 IU/L — ABNORMAL HIGH (ref 39–117)
BUN/Creatinine Ratio: 19 (ref 12–28)
BUN: 13 mg/dL (ref 8–27)
Bilirubin Total: <0.2 mg/dL (ref 0.0–1.2)
CHLORIDE: 102 mmol/L (ref 96–106)
CO2: 23 mmol/L (ref 20–29)
CREATININE: 0.67 mg/dL (ref 0.57–1.00)
Calcium: 9.2 mg/dL (ref 8.7–10.3)
GFR, EST AFRICAN AMERICAN: 102 mL/min/{1.73_m2} (ref >59)
GFR, EST NON AFRICAN AMERICAN: 89 mL/min/{1.73_m2} (ref >59)
GLUCOSE: 95 mg/dL (ref 65–99)
Globulin, Total: 3 g/dL (ref 1.5–4.5)
POTASSIUM: 4.4 mmol/L (ref 3.5–5.2)
SODIUM: 141 mmol/L (ref 134–144)
TOTAL PROTEIN: 7 g/dL (ref 6.0–8.5)

## 2017-01-24 LAB — HGB A1C W/O EAG: Hgb A1c MFr Bld: 6.9 % — ABNORMAL HIGH (ref 4.8–5.6)

## 2017-01-24 LAB — TSH: TSH: 12.53 u[IU]/mL — ABNORMAL HIGH (ref 0.450–4.500)

## 2017-01-25 NOTE — Progress Notes (Signed)
[name]  [phone] [phone]  Clinic Follow up Note   Patient Care Team: Binnie Rail, MD as PCP - General (Internal Medicine) Truitt Merle, MD as Consulting Physician (Hematology) Jackolyn Confer, MD as Consulting Physician (General Surgery)   Date of Service: 01/26/2017  CHIEF COMPLAINTS:  Follow up carcinoid tumor    Oncology History   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   Staging form: Colon and Rectum, AJCC 7th Edition     Clinical: T3, N2, M0 - Unsigned       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 ([facility])   01/21/2014 Imaging    CT: Distal small bowel obstruction due to 3.4 cm soft tissue mass near the ileocecal valve, with adjacent right lower quadrant mesenteric lymphadenopathy      02/03/2014 Pathologic Stage    invasive well diff neuroendocrine tumor (carcinoid), 3cm, pT3pN2 (5/25 nodes positive). negative margines.      02/03/2014 Surgery    Laparoscopic-assisted right colectomy and resection of distal ileum      02/03/2014 Initial Diagnosis    Metastatic malignant neuroendocrine tumor of the ileocecal valve to regional lymph nodes.       05/31/2015 Imaging    CT A/P w contrast  IMPRESSION: 1. No evidence of metastatic disease. 2. Question mild basilar subpleural pulmonary fibrosis.       HISTORY OF PRESENTING ILLNESS: Jessica Farrell is very nice 70 year old white female who was referred by GI Dr. Zella Richer for newly diagnosed small intestine carcinoid tumor.   She presented with 5-6 week history of mid and generalized abdominal discomfort with episodes of sharp intense abdominal pain, intermittent nausea and vomiting and diarrhea with loose stools generally postprandially. She was referred to GI Dr. Lillia Dallas who ordered a CT of the abdomen which was done on 01/21/2014. It shows a 2.6 x 3.4 cm soft tissue mass at the ileocecal valve there is mild adjacent adenopathy. There is  also moderate dilation of the distal small bowel loops. Unclear whether this represents an adenocarcinoma or possibly a carcinoid. She underwent Colonoscopy 01/28/2014 and a circumferential mass was found within the ileocecal valve and partially obstructing the lumen ,multiple biopsies were performed which came back negative for malignancy.   She presented to ED with abdominal pain, nausea and vomiting on 02/03/2014 and was admitted for SBO, and underwent Laparoscopic-assisted right colectomy and resection of distal ileum on 02/03/2014 by Dr. Zella Richer .   She has recovered well after surgery. The incision related pain is near resolved, still has small discharge form the surgical wound. She eats well, she has been having diarrhea since the surgery, improved, 3 loose BM daily now. She lost about 50 lbs in the past 2 years, which she contributes mainly to her diet control for DM.   CURRENT THERAPY: Observation   INTERIM HISTORY:  Jessica Farrell returns for follow-up. She was last seen by me 6 months ago. She presents to the clinic today noting she is doing well. She reports noting new in the past 6 months. She notes she has been tired lately. She will be increasing her thyroid medication. Her PCP has been checking her TSH and has been elevated. She thinks her level is the reason why she is tired. She notes she still has diarrhea ever since her surgery. At times she will have to take imodium. She is not sure what foods makes her BM worse, they all do the same. When she eats [place] food it will help her  BM. She will have a BM every time she eats due to her shorted small bowel.     MEDICAL HISTORY:  Past Medical History:  Diagnosis Date  . Asthma   . Cellulitis March  2014   Left foot and ankle  . colon ca dx'd 01/2014  . Colon polyps    2015   . Diabetes mellitus ([facility]) 05/05/2012   Type II  . Hyperlipidemia   . Morbid obesity ([facility])   . Thyroid disease 1990's   HypoThyroidism    SURGICAL  HISTORY: Past Surgical History:  Procedure Laterality Date  . COLON RESECTION N/A 02/03/2014   Procedure: LAPAROSCOPIC ASSISTED BOWEL RESECTION;  Surgeon: Jackolyn Confer, MD;  Location: [facility];  Service: General;  Laterality: N/A;  . FRACTURE SURGERY  2000   Ankle  . TOOTH EXTRACTION     [facility]is[facility] Teeth    SOCIAL HISTORY: History   Social History  . Marital Status: Divorced    Spouse Name: N/A    Number of Children: 2  . Years of Education: N/A   Occupational History  . Secretary, still work part time    Social History Main Topics  . Smoking status: Never Smoker   . Smokeless tobacco: Never Used  . Alcohol Use: No  . Drug Use: No  . Sexual Activity: Not Currently    Birth Control/ Protection: Post-menopausal   Other Topics Concern  . Not on file   Social History Narrative    FAMILY HISTORY: Family History  Problem Relation Age of Onset  . Alzheimer's disease Father   . Heart disease Mother   . Arthritis Mother   . Hypertension Mother   . Alzheimer's disease Sister   . Cancer Sister 75       breast cancer   . Heart disease Brother        Heart Disease before age 35  . Colon cancer Neg Hx   . Esophageal cancer Neg Hx   . Rectal cancer Neg Hx   . Stomach cancer Neg Hx     ALLERGIES:  is allergic to penicillins.  MEDICATIONS:  Current Outpatient Medications  Medication Sig Dispense Refill  . Albuterol Sulfate 108 (90 Base) MCG/ACT AEPB Inhale 2 puffs into the lungs every 4 (four) hours as needed (wheezing/SOB). Reported on 06/07/2015 1 each 0  . atorvastatin (LIPITOR) 10 MG tablet Take 1 tablet (10 mg total) by mouth daily. 90 tablet 3  . levothyroxine (SYNTHROID, LEVOTHROID) 125 MCG tablet Take 1 tablet (125 mcg total) by mouth daily. 90 tablet 0  . metFORMIN (GLUCOPHAGE) 500 MG tablet Take 1 tablet (500 mg total) by mouth daily with breakfast. 90 tablet 1  . montelukast (SINGULAIR) 10 MG tablet Take 1 tablet (10 mg total) by mouth at bedtime. 90 tablet 3  .  umeclidinium-vilanterol (ANORO ELLIPTA) 62.5-25 MCG/INH AEPB Inhale 1 puff daily into the lungs. (Patient not taking: Reported on 01/26/2017) 60 each 5   No current facility-administered medications for this visit.     REVIEW OF SYSTEMS:   Constitutional: Denies fevers, chills or abnormal night sweats (+) fatigue  Eyes: Denies blurriness of vision, double vision or watery eyes Ears, nose, mouth, throat, and face: Denies mucositis or sore throat Respiratory: Denies cough, dyspnea or wheezes Cardiovascular: Denies palpitation, chest discomfort or lower extremity swelling Gastrointestinal:  Denies nausea, heartburn   (+)diarrhea, loose 3--4 times/with every meal Skin: Denies abnormal skin rashes Lymphatics: Denies new lymphadenopathy or easy bruising Neurological:Denies numbness, tingling or new weaknesses  Behavioral/Psych: Mood is stable, no new changes  All other systems were reviewed with the patient and are negative.  PHYSICAL EXAMINATION: ECOG PERFORMANCE STATUS: 0 - Asymptomatic  Vitals:   01/26/17 1403  BP: 127/66  Pulse: 73  Resp: 18  Temp: 97.9 F (36.6 C)  SpO2: 98%   Filed Weights   01/26/17 1403  Weight: 180 lb 1.6 oz (81.7 kg)    GENERAL:alert, no distress and comfortable SKIN: skin color, texture, turgor are normal, no rashes or significant lesions EYES: normal, conjunctiva are pink and non-injected, sclera clear OROPHARYNX:no exudate, no erythema and lips, buccal mucosa, and tongue normal  NECK: supple, thyroid normal size, non-tender, without nodularity LYMPH:  no palpable lymphadenopathy in the cervical, axillary or inguinal LUNGS: clear to auscultation and percussion with normal breathing effort HEART: regular rate & rhythm and no murmurs and no lower extremity edema ABDOMEN:abdomen soft, non-tender and normal bowel sounds Musculoskeletal:no cyanosis of digits and no clubbing  PSYCH: alert & oriented x 3 with fluent speech NEURO: no focal motor/sensory  deficits  LABORATORY DATA:  I have reviewed the data as listed Lab Results  Component Value Date   WBC 8.2 01/19/2017   HGB 13.0 01/19/2017   HCT 39.6 01/19/2017   MCV 88.8 01/19/2017   PLT 249 01/19/2017   Recent Labs    07/20/16 1448 01/19/17 1345 01/19/17 1346  NA 139 141 140  K 4.1 4.4 4.1  CL  --  102  --   CO2 23 23 26   GLUCOSE 118 95 90  BUN 12.9 13 13.0  CREATININE 0.8 0.67 0.8  CALCIUM 9.2 9.2 9.3  GFRNONAA  --  89  --   GFRAA  --  102  --   PROT 7.1 7.0 7.4  ALBUMIN 3.5 4.0 3.6  AST 15 19 19   ALT 14 21 22   ALKPHOS 156* 157* 148  BILITOT 0.32 <0.2 0.27    Results for Jessica, Farrell" (MRN [id number]) as of 01/26/2017 14:39  Ref. Range 03/02/2014 15:49 05/18/2014 15:45 11/23/2014 11:52 05/31/2015 12:45  Chromogranin A Latest Ref Range: <=15 ng/mL 5 7 7 11   01/19/17: 2 07/19/16: 1 01/19/17: 2   PATHOLOGY REPORT  Small intestine, resection for tumor, ascending 02/03/2014 - INVASIVE WELL DIFFERENTIATED NEUROENDOCRINE TUMOR (CARCINOID), SPANNING 3 CM IN GREATEST DIMENSION. - TUMOR INVADES MUSCULARIS PROPRIA TO INVOLVE SUBSEROSAL SOFT TISSUES. - MARGINS ARE NEGATIVE. - FIVE OF TWENTY-FIVE LYMPH NODES ARE POSITIVE FOR METASTATIC LOW GRADE NEUROENDOCRINE TUMOR (5/25). - EXTRACAPSULAR EXTENSION IS IDENTIFIED. - SEE ONCOLOGY TEMPLATE. Specimen: Portion of terminal ileum with attached proximal portion of right colon. Procedure: Laparoscopic assisted right colectomy and resection of distal ileum. Tumor Site: Distal terminal ileum, 3 cm proximal to ileocecal valve. Tumor Size: 3 cm in greatest dimension. Tumor Focality: Unifocal. Histologic Type: Well differentiated neuroendocrine tumor (carcinoid tumor) . Histologic Grade : G1: Low grade. Mitotic Rate low mitotic rate, less than 1/10 mitoses per hpf (high power field). Microscopic Tumor Extension: Tumor invades through muscularis propria to involve subserosal soft tissues, Margins: Proximal Margin:  Negative. Distal Margin: Negative. Mesenteric (Radial) Margin : Negative. All margins uninvolved by neuroendocrine tumor: Distance from closest margin: 4.5 cm Margin: Mesenteric margin. Lymph-Vascular Invasion: Tumor is seen involving a subcapsular sinus space (lymphatic space) in one of the lymph nodes. Perineural Invasion: Not identified. Lymph nodes: number examined - 25; number positive: 5. TNM Staging: pT3, pN1.   RADIOGRAPHIC STUDIES: I have personally reviewed the radiological images as listed and agreed with the  findings in the report.  CT abdomen and pelvis 05/31/2015 IMPRESSION: 1. No evidence of metastatic disease. 2. Question mild basilar subpleural pulmonary fibrosis.   ASSESSMENT & PLAN:  71 y.o.  female with newly diagnosed carcinoid tumor at the terminal ileum near the ileumcecal valve.  1. 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 Surgical margins were negative. -I previously discussed her diagnosis and the staging with her in great details.  -There is no role of adjuvant chemotherapy for resected carcinoid tumor.  -We previously discussed the risks of tumor recurrence in the future, including late recurrence. -Her postop chromogranin A and urine 5 HIAA level were normal. Those were not checked before surgery. -I previously recommend a total of 10 years surveillance, with office visit, lab every 6 months for the first 2 years, then every 6-12 months afterwards. CT scans can be considered for surveillance, per [place] and guideline. -I previously discussed her restaging CT scan on 05/31/2015, which showed no evidence of recurrence. -I recommend continue annual surveillance CT scan. However her insurance does not pay for CT scans. She only wants to do them if she absolutely has to. -01/19/17 Lab results from last week reviewed with her, CBC, CMP and chromogranin A  levels are normal. -Patient is clinically doing very well, asymptomatic, exam was unremarkable, no clinical concern of recurrence.  -It has been about 3 years from diagnosis. After next scan we will repeat every 2 years. After 5 years we will follow up every 2 years.  -Continue clinical surveillance  -follow-up in 6 months with CT scan.   2. HTN, DM, hypothyroidism -She will continue medication and follow-up with her primary care physician. -Her TSH levels have been elevated. Her PCP increased her Synthroid to 117mcg    Plan:  -F/u in 6 months with lab and CT abd/pel w contrast one week before    All questions were answered. The patient knows to call the clinic with any problems, questions or concerns. I spent 15 minutes counseling the patient face to face. The total time spent in the appointment was 20 minutes and more than 50% was on counseling.  This document serves as a record of services personally performed by Truitt Merle, MD. It was created on her behalf by Joslyn Devon, a trained medical scribe. The creation of this record is based on the scribe's personal observations and the provider's statements to them.  I have reviewed the above documentation for accuracy and completeness, and I agree with the above.     Truitt Merle, MD 01/26/2017

## 2017-01-26 ENCOUNTER — Other Ambulatory Visit: Payer: Self-pay | Admitting: Emergency Medicine

## 2017-01-26 ENCOUNTER — Encounter: Payer: Self-pay | Admitting: Hematology

## 2017-01-26 ENCOUNTER — Telehealth: Payer: Self-pay | Admitting: Hematology

## 2017-01-26 ENCOUNTER — Ambulatory Visit (HOSPITAL_BASED_OUTPATIENT_CLINIC_OR_DEPARTMENT_OTHER): Payer: Medicare Other | Admitting: Hematology

## 2017-01-26 VITALS — BP 127/66 | HR 73 | Temp 97.9°F | Resp 18 | Ht 65.0 in | Wt 180.1 lb

## 2017-01-26 DIAGNOSIS — D3A021 Benign carcinoid tumor of the cecum: Secondary | ICD-10-CM | POA: Insufficient documentation

## 2017-01-26 DIAGNOSIS — Z85060 Personal history of malignant carcinoid tumor of small intestine: Secondary | ICD-10-CM

## 2017-01-26 DIAGNOSIS — E038 Other specified hypothyroidism: Secondary | ICD-10-CM

## 2017-01-26 DIAGNOSIS — E039 Hypothyroidism, unspecified: Secondary | ICD-10-CM

## 2017-01-26 DIAGNOSIS — E119 Type 2 diabetes mellitus without complications: Secondary | ICD-10-CM

## 2017-01-26 DIAGNOSIS — C7B8 Other secondary neuroendocrine tumors: Secondary | ICD-10-CM | POA: Diagnosis not present

## 2017-01-26 DIAGNOSIS — I10 Essential (primary) hypertension: Secondary | ICD-10-CM

## 2017-01-26 MED ORDER — LEVOTHYROXINE SODIUM 125 MCG PO TABS: 125.0000 ug | ORAL_TABLET | Freq: Every day | ORAL | 0 refills | Status: DC

## 2017-01-26 NOTE — Telephone Encounter (Signed)
Scheduled appt per 12/7 los - Gave patient AVS and calender per los. Central radiology to contact patient with ct schedule.

## 2017-01-30 LAB — 5 HIAA, QUANTITATIVE, URINE, 24 HOUR
5-HIAA, Urine: 2 mg/L
5-HIAA,QUANT.,24 HR URINE: 2.2 mg/(24.h) (ref 0.0–14.9)

## 2017-04-04 ENCOUNTER — Other Ambulatory Visit: Payer: Self-pay | Admitting: Emergency Medicine

## 2017-04-04 DIAGNOSIS — E1165 Type 2 diabetes mellitus with hyperglycemia: Secondary | ICD-10-CM

## 2017-04-04 MED ORDER — ATORVASTATIN CALCIUM 10 MG PO TABS: 10.0000 mg | ORAL_TABLET | Freq: Every day | ORAL | 1 refills | Status: DC

## 2017-04-04 MED ORDER — METFORMIN HCL 500 MG PO TABS: 500.0000 mg | ORAL_TABLET | Freq: Every day | ORAL | 1 refills | Status: DC

## 2017-04-12 ENCOUNTER — Other Ambulatory Visit: Payer: Self-pay | Admitting: Emergency Medicine

## 2017-04-12 DIAGNOSIS — E1165 Type 2 diabetes mellitus with hyperglycemia: Secondary | ICD-10-CM

## 2017-04-12 MED ORDER — MONTELUKAST SODIUM 10 MG PO TABS: 10.0000 mg | ORAL_TABLET | Freq: Every day | ORAL | 1 refills | Status: DC

## 2017-04-21 ENCOUNTER — Encounter: Payer: Self-pay | Admitting: Internal Medicine

## 2017-04-21 ENCOUNTER — Ambulatory Visit (INDEPENDENT_AMBULATORY_CARE_PROVIDER_SITE_OTHER): Payer: Medicare HMO | Admitting: Internal Medicine

## 2017-04-21 ENCOUNTER — Other Ambulatory Visit: Payer: Self-pay

## 2017-04-21 VITALS — BP 104/70 | HR 113 | Temp 97.9°F | Resp 16 | Ht 65.0 in | Wt 183.0 lb

## 2017-04-21 DIAGNOSIS — I872 Venous insufficiency (chronic) (peripheral): Secondary | ICD-10-CM

## 2017-04-21 DIAGNOSIS — L03116 Cellulitis of left lower limb: Secondary | ICD-10-CM

## 2017-04-21 MED ORDER — CEFDINIR 300 MG PO CAPS: 300.0000 mg | ORAL_CAPSULE | Freq: Two times a day (BID) | ORAL | 0 refills | Status: DC

## 2017-04-21 MED ORDER — VASCULERA PO TABS: 1.0000 | ORAL_TABLET | Freq: Every day | ORAL | 11 refills | Status: DC

## 2017-04-21 NOTE — Progress Notes (Signed)
Subjective:  Patient ID: Jessica Farrell, female    DOB: Mar 14, 1945  Age: 72 y.o. MRN: [id number]  CC: Leg Swelling   HPI Jessica Farrell presents for a recurrent problem with her left lower extremity.  She has had prior episodes of swelling and cellulitis in her left lower leg.  She was feeling well until 3 days ago when she developed a low-grade fever and chills.  She has also noticed redness and swelling in her left lower extremity.  She has a chronic unchanged cough which is not productive of purulent phlegm.  Outpatient Medications Prior to Visit  Medication Sig Dispense Refill  . Albuterol Sulfate 108 (90 Base) MCG/ACT AEPB Inhale 2 puffs into the lungs every 4 (four) hours as needed (wheezing/SOB). Reported on 06/07/2015 1 each 0  . atorvastatin (LIPITOR) 10 MG tablet Take 1 tablet (10 mg total) by mouth daily. 90 tablet 1  . levothyroxine (SYNTHROID, LEVOTHROID) 125 MCG tablet Take 1 tablet (125 mcg total) by mouth daily. 90 tablet 0  . metFORMIN (GLUCOPHAGE) 500 MG tablet Take 1 tablet (500 mg total) by mouth daily with breakfast. 90 tablet 1  . montelukast (SINGULAIR) 10 MG tablet Take 1 tablet (10 mg total) by mouth at bedtime. 90 tablet 1  . umeclidinium-vilanterol (ANORO ELLIPTA) 62.5-25 MCG/INH AEPB Inhale 1 puff daily into the lungs. 60 each 5   No facility-administered medications prior to visit.     ROS Review of Systems  Constitutional: Positive for chills and fever. Negative for appetite change, diaphoresis and fatigue.  HENT: Negative.  Negative for sore throat and trouble swallowing.   Eyes: Negative.   Respiratory: Positive for cough. Negative for chest tightness, shortness of breath and wheezing.   Cardiovascular: Negative for chest pain, palpitations and leg swelling.  Gastrointestinal: Negative for abdominal pain, diarrhea, nausea and vomiting.  Endocrine: Negative.   Genitourinary: Negative.  Negative for difficulty urinating.  Musculoskeletal: Negative.   Negative for arthralgias and myalgias.  Skin: Positive for color change and rash. Negative for pallor.  Allergic/Immunologic: Negative.   Neurological: Negative.  Negative for dizziness and weakness.  Hematological: Negative for adenopathy. Does not bruise/bleed easily.  Psychiatric/Behavioral: Negative.     Objective:  BP 104/70   Pulse (!) 113   Temp 97.9 F (36.6 C) (Oral)   Resp 16   Ht 5\' 5"  (1.651 m)   Wt 183 lb (83 kg)   SpO2 96%   BMI 30.45 kg/m   BP Readings from Last 3 Encounters:  04/21/17 104/70  01/26/17 127/66  12/29/16 122/68    Wt Readings from Last 3 Encounters:  04/21/17 183 lb (83 kg)  01/26/17 180 lb 1.6 oz (81.7 kg)  12/29/16 175 lb (79.4 kg)    Physical Exam  Constitutional: She is oriented to person, place, and time.  Non-toxic appearance. She does not have a sickly appearance. She does not appear ill. No distress.  HENT:  Mouth/Throat: Oropharynx is clear and moist. No oropharyngeal exudate.  Eyes: Conjunctivae are normal. No scleral icterus.  Neck: Normal range of motion. Neck supple. No JVD present.  Cardiovascular: Normal rate, regular rhythm and normal heart sounds. Exam reveals no gallop.  No murmur heard. Pulmonary/Chest: Effort normal and breath sounds normal. No respiratory distress. She has no wheezes. She has no rales.  Abdominal: Soft. Bowel sounds are normal. She exhibits no distension and no mass. There is no tenderness. There is no guarding.  Musculoskeletal: Normal range of motion. She exhibits edema, tenderness and deformity.  Legs: Lymphadenopathy:    She has no cervical adenopathy.  Neurological: She is alert and oriented to person, place, and time.  Skin: Skin is warm and dry. No rash noted. She is not diaphoretic. There is erythema. No pallor.  Vitals reviewed.   Lab Results  Component Value Date   WBC 8.2 01/19/2017   HGB 13.0 01/19/2017   HCT 39.6 01/19/2017   PLT 249 01/19/2017   GLUCOSE 90 01/19/2017   CHOL  133 07/20/2016   TRIG 204 (H) 07/20/2016   HDL 37 (L) 07/20/2016   LDLCALC 55 07/20/2016   ALT 22 01/19/2017   AST 19 01/19/2017   NA 140 01/19/2017   K 4.1 01/19/2017   CL 102 01/19/2017   CREATININE 0.8 01/19/2017   BUN 13.0 01/19/2017   CO2 26 01/19/2017   TSH 12.530 (H) 01/19/2017   INR 1.04 02/03/2014   HGBA1C 6.9 (H) 01/19/2017   MICROALBUR <0.7 09/15/2015    Ct Abdomen Pelvis W Contrast  Result Date: 05/31/2015 CLINICAL DATA:  Colon cancer. EXAM: CT ABDOMEN AND PELVIS WITH CONTRAST TECHNIQUE: Multidetector CT imaging of the abdomen and pelvis was performed using the standard protocol following bolus administration of intravenous contrast. CONTRAST:  124mL ISOVUE-300 IOPAMIDOL (ISOVUE-300) INJECTION 61% COMPARISON:  05/18/2014. FINDINGS: Lower chest: There may be mild subpleural fibrosis in the lung bases. Heart size normal. No pericardial or pleural effusion. Hepatobiliary: Liver and gallbladder are unremarkable. No biliary ductal dilatation. Pancreas: Negative. Spleen: Negative. Adrenals/Urinary Tract: Adrenal glands and kidneys are unremarkable. Ureters are decompressed. Bladder is unremarkable. Stomach/Bowel: Tiny hiatal hernia. Stomach is otherwise unremarkable. Duodenal diverticulum. Small bowel is otherwise unremarkable. Right hemicolectomy. Colon is otherwise unremarkable. Vascular/Lymphatic: Scattered atherosclerotic calcification of the arterial vasculature without abdominal aortic aneurysm. No pathologically enlarged lymph nodes. Reproductive: Uterus and ovaries are visualized. Other: Midline ventral hernias contain fat. No free fluid. Mesenteries and peritoneum are otherwise unremarkable. Musculoskeletal: No worrisome lytic or sclerotic lesions. IMPRESSION: 1. No evidence of metastatic disease. 2. Question mild basilar subpleural pulmonary fibrosis. Electronically Signed   By: Lorin Picket M.D.   On: 05/31/2015 15:34    Assessment & Plan:   Jessica Farrell was seen today for  leg swelling.  Diagnoses and all orders for this visit:  Cellulitis of left anterior lower leg- She agrees to keep her left lower extremity elevated for the next few days.  It appears that she has a strep infection so I have asked her to take a course of [place].  I have demarcated the area of infection and she will return in 3-5 days.  If by her assessment the infection is spreading over the next few days then she will let me know and we will consider inpatient treatment for IV antibiotics and leg elevation. -     cefdinir (OMNICEF) 300 MG capsule; Take 1 capsule (300 mg total) by mouth 2 (two) times daily.  Chronic venous insufficiency- I have asked her to start taking vasculera to reduce the risk of complications such as infection, wounds, and ulcerations. -     Dietary Management Product (VASCULERA) TABS; Take 1 capsule by mouth daily.   I am having Jessica Lory "[name]" start on VASCULERA and cefdinir. I am also having her maintain her Albuterol Sulfate, umeclidinium-vilanterol, levothyroxine, atorvastatin, metFORMIN, and montelukast.  Meds ordered this encounter  Medications  . Dietary Management Product (VASCULERA) TABS    Sig: Take 1 capsule by mouth daily.    Dispense:  30 tablet    Refill:  11  .  cefdinir (OMNICEF) 300 MG capsule    Sig: Take 1 capsule (300 mg total) by mouth 2 (two) times daily.    Dispense:  20 capsule    Refill:  0     Follow-up: Return in about 5 days (around 04/26/2017).  Scarlette Calico, MD

## 2017-04-21 NOTE — Patient Instructions (Signed)

## 2017-04-23 NOTE — Progress Notes (Signed)
Subjective:    Patient ID: Jessica Farrell, female    DOB: Apr 11, 1945, 72 y.o.   MRN: [id number]  HPI The patient is here for follow up.  Cellulitis of left anterior lower leg:  He was seen last week on 3/2 for cellulitis.  She has had prior episodes.  Her symptoms started 3 days prior and she had redness, swelling in her left leg.  She had low grade fever, chills.  She was started on Omnicef and vasculera.   She is here for follow up.   She is taking omnicef but has not started the vasculera. Her leg is better.  She denies fever or chills since Saturday.  There is still some redness.  It is still swollen.    Hypothyroidism:  She is taking her medication daily.  She does state decreased energy and weight gain.  She was supposed to come in for blood work last month to recheck this.   Diabetes: She is taking her medication daily as prescribed. She is compliant with a diabetic diet.  She checks her feet daily and denies foot lesions. She is up-to-date with an ophthalmology examination.     Medications and allergies reviewed with patient and updated if appropriate.  Patient Active Problem List   Diagnosis Date Noted  . Carcinoid tumor of cecum 01/26/2017  . Dysuria 02/18/2016  . 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 ([place]) 02/18/2014  . Chronic venous insufficiency 08/02/2012  . HLD (hyperlipidemia) 05/15/2012  . Cellulitis of left anterior lower leg 05/05/2012  . Diabetes mellitus ([place]) 05/05/2012  . Hypothyroidism 05/05/2012  . Asthma 05/05/2012    Current Outpatient Medications on File Prior to Visit  Medication Sig Dispense Refill  . Albuterol Sulfate 108 (90 Base) MCG/ACT AEPB Inhale 2 puffs into the lungs every 4 (four) hours as needed (wheezing/SOB). Reported on 06/07/2015 1 each 0  . atorvastatin (LIPITOR) 10 MG tablet Take 1 tablet (10 mg total) by mouth daily. 90 tablet 1  . cefdinir (OMNICEF) 300 MG capsule Take 1 capsule (300 mg total) by mouth 2 (two)  times daily. 20 capsule 0  . Dietary Management Product (VASCULERA) TABS Take 1 capsule by mouth daily. 30 tablet 11  . levothyroxine (SYNTHROID, LEVOTHROID) 125 MCG tablet Take 1 tablet (125 mcg total) by mouth daily. 90 tablet 0  . metFORMIN (GLUCOPHAGE) 500 MG tablet Take 1 tablet (500 mg total) by mouth daily with breakfast. 90 tablet 1  . montelukast (SINGULAIR) 10 MG tablet Take 1 tablet (10 mg total) by mouth at bedtime. 90 tablet 1  . umeclidinium-vilanterol (ANORO ELLIPTA) 62.5-25 MCG/INH AEPB Inhale 1 puff daily into the lungs. 60 each 5   No current facility-administered medications on file prior to visit.     Past Medical History:  Diagnosis Date  . Asthma   . Cellulitis March  2014   Left foot and ankle  . colon ca dx'd 01/2014  . Colon polyps    2015   . Diabetes mellitus ([place]) 05/05/2012   Type II  . Hyperlipidemia   . Morbid obesity ([place])   . Thyroid disease 1990's   HypoThyroidism    Past Surgical History:  Procedure Laterality Date  . COLON RESECTION N/A 02/03/2014   Procedure: LAPAROSCOPIC ASSISTED BOWEL RESECTION;  Surgeon: Jackolyn Confer, MD;  Location: [facility];  Service: General;  Laterality: N/A;  . FRACTURE SURGERY  2000   Ankle  . TOOTH EXTRACTION     wisdom Teeth    Social  History   Socioeconomic History  . Marital status: Divorced    Spouse name: None  . Number of children: None  . Years of education: None  . Highest education level: None  Social Needs  . Financial resource strain: None  . Food insecurity - worry: None  . Food insecurity - inability: None  . Transportation needs - medical: None  . Transportation needs - non-medical: None  Occupational History  . None  Tobacco Use  . Smoking status: Never Smoker  . Smokeless tobacco: Never Used  Substance and Sexual Activity  . Alcohol use: No  . Drug use: No  . Sexual activity: Not Currently    Birth control/protection: Post-menopausal  Other Topics Concern  . None  Social  History Narrative  . None    Family History  Problem Relation Age of Onset  . Alzheimer's disease Father   . Heart disease Mother   . Arthritis Mother   . Hypertension Mother   . Alzheimer's disease Sister   . Cancer Sister 47       breast cancer   . Heart disease Brother        Heart Disease before age 73  . Colon cancer Neg Hx   . Esophageal cancer Neg Hx   . Rectal cancer Neg Hx   . Stomach cancer Neg Hx     Review of Systems  Constitutional: Negative for appetite change, chills and fever.  Gastrointestinal: Negative for nausea.  Skin: Positive for color change (still redness in LLE).  Neurological: Positive for numbness (mild in LLE). Negative for light-headedness and headaches.       Objective:   Vitals:   04/24/17 1337  BP: 110/68  Pulse: 75  Resp: 16  Temp: 97.8 F (36.6 C)  SpO2: 97%   Wt Readings from Last 3 Encounters:  04/24/17 187 lb (84.8 kg)  04/21/17 183 lb (83 kg)  01/26/17 180 lb 1.6 oz (81.7 kg)   Body mass index is 31.12 kg/m.   Physical Exam  Constitutional: She appears well-developed and well-nourished. No distress.  HENT:  Head: Normocephalic and atraumatic.  Musculoskeletal: She exhibits edema (trace edema in LLE, non pitting).  Neurological:  Normal sensation LLE  Skin: Skin is warm and dry. She is not diaphoretic. There is erythema (left lower leg - per patient improved; mild warmth and tenderness).           Assessment & Plan:    See Problem List for Assessment and Plan of chronic medical problems.

## 2017-04-24 ENCOUNTER — Other Ambulatory Visit (INDEPENDENT_AMBULATORY_CARE_PROVIDER_SITE_OTHER): Payer: Medicare HMO

## 2017-04-24 ENCOUNTER — Ambulatory Visit (INDEPENDENT_AMBULATORY_CARE_PROVIDER_SITE_OTHER): Payer: Medicare HMO | Admitting: Internal Medicine

## 2017-04-24 ENCOUNTER — Encounter: Payer: Self-pay | Admitting: Internal Medicine

## 2017-04-24 VITALS — BP 110/68 | HR 75 | Temp 97.8°F | Resp 16 | Wt 187.0 lb

## 2017-04-24 DIAGNOSIS — L03116 Cellulitis of left lower limb: Secondary | ICD-10-CM

## 2017-04-24 DIAGNOSIS — E1165 Type 2 diabetes mellitus with hyperglycemia: Secondary | ICD-10-CM | POA: Diagnosis not present

## 2017-04-24 DIAGNOSIS — I872 Venous insufficiency (chronic) (peripheral): Secondary | ICD-10-CM

## 2017-04-24 DIAGNOSIS — E119 Type 2 diabetes mellitus without complications: Secondary | ICD-10-CM | POA: Diagnosis not present

## 2017-04-24 DIAGNOSIS — E038 Other specified hypothyroidism: Secondary | ICD-10-CM

## 2017-04-24 LAB — HEMOGLOBIN A1C: HEMOGLOBIN A1C: 7.3 % — AB (ref 4.6–6.5)

## 2017-04-24 LAB — COMPREHENSIVE METABOLIC PANEL
ALT: 28 U/L (ref 0–35)
AST: 14 U/L (ref 0–37)
Albumin: 3.5 g/dL (ref 3.5–5.2)
Alkaline Phosphatase: 114 U/L (ref 39–117)
BILIRUBIN TOTAL: 0.2 mg/dL (ref 0.2–1.2)
BUN: 16 mg/dL (ref 6–23)
CO2: 30 meq/L (ref 19–32)
Calcium: 9.5 mg/dL (ref 8.4–10.5)
Chloride: 104 mEq/L (ref 96–112)
Creatinine, Ser: 0.72 mg/dL (ref 0.40–1.20)
GFR: 84.64 mL/min (ref >60.00)
GLUCOSE: 174 mg/dL — AB (ref 70–99)
POTASSIUM: 4.5 meq/L (ref 3.5–5.1)
Sodium: 139 mEq/L (ref 135–145)
Total Protein: 7.1 g/dL (ref 6.0–8.3)

## 2017-04-24 LAB — TSH: TSH: 13.87 u[IU]/mL — AB (ref 0.35–4.50)

## 2017-04-24 NOTE — Assessment & Plan Note (Signed)
Sugars have been controlled, but as current infection Check a1c today

## 2017-04-24 NOTE — Patient Instructions (Signed)
  Test(s) ordered today. Your results will be released to MyChart (or called to you) after review, usually within 72hours after test completion. If any changes need to be made, you will be notified at that same time.    Medications reviewed and updated.  No changes recommended at this time.     

## 2017-04-24 NOTE — Assessment & Plan Note (Signed)
Improving Continue omnicef - if cellulitis does not resolve completely she will call and will extend or change antibiotic Elevate Compression socks

## 2017-04-24 NOTE — Assessment & Plan Note (Signed)
Having some fatigue and weight gain Over due for tsh recheck Will check today - will adjust medication if needed

## 2017-04-24 NOTE — Assessment & Plan Note (Signed)
Chronic edema, recurrent cellulitis in LLE Wears compression socks daily

## 2017-04-27 ENCOUNTER — Other Ambulatory Visit: Payer: Self-pay | Admitting: Emergency Medicine

## 2017-04-27 DIAGNOSIS — E1165 Type 2 diabetes mellitus with hyperglycemia: Secondary | ICD-10-CM

## 2017-04-27 DIAGNOSIS — E039 Hypothyroidism, unspecified: Secondary | ICD-10-CM

## 2017-04-27 MED ORDER — METFORMIN HCL 500 MG PO TABS: 500.0000 mg | ORAL_TABLET | Freq: Two times a day (BID) | ORAL | 1 refills | Status: DC

## 2017-04-27 MED ORDER — LEVOTHYROXINE SODIUM 137 MCG PO TABS: 137.0000 ug | ORAL_TABLET | Freq: Every day | ORAL | 0 refills | Status: DC

## 2017-04-30 ENCOUNTER — Ambulatory Visit: Payer: Self-pay

## 2017-04-30 NOTE — Telephone Encounter (Signed)
Pt calling with c/o upper abdominal pain rating it a 7 out of 10. Pain started yesterday. Pain began after several episodes of vomiting. Also having diarrhea. Pain does not radiate. Called and spoke to Sam at PCP office and pt advised to go to ED. Pt states she will go but needs a ride. Offered to call 911 for her. Pt states she will call her daughter now.  Reason for Disposition . [1] Pain lasts > 10 minutes AND [2] age > 14  Answer Assessment - Initial Assessment Questions 1. LOCATION: "Where does it hurt?"      Upper abdomen 2. RADIATION: "Does the pain shoot anywhere else?" (e.g., chest, back)     no 3. ONSET: "When did the pain begin?" (e.g., minutes, hours or days ago)      yesterday 4. SUDDEN: "Gradual or sudden onset?"     Gradual after vomiting 5. PATTERN "Does the pain come and go, or is it constant?"    - If constant: "Is it getting better, staying the same, or worsening?"      (Note: Constant means the pain never goes away completely; most serious pain is constant and it progresses)     - If intermittent: "How long does it last?" "Do you have pain now?"     (Note: Intermittent means the pain goes away completely between bouts)     constant 6. SEVERITY: "How bad is the pain?"  (e.g., Scale 1-10; mild, moderate, or severe)    - MILD (1-3): doesn't interfere with normal activities, abdomen soft and not tender to touch     - MODERATE (4-7): interferes with normal activities or awakens from sleep, tender to touch     - SEVERE (8-10): excruciating pain, doubled over, unable to do any normal activities       7 out of 10 7. RECURRENT SYMPTOM: "Have you ever had this type of abdominal pain before?" If so, ask: "When was the last time?" and "What happened that time?"      no 8. AGGRAVATING FACTORS: "Does anything seem to cause this pain?" (e.g., foods, stress, alcohol)     no 9. CARDIAC SYMPTOMS: "Do you have any of the following symptoms: chest pain, difficulty breathing, sweating,  nausea?"     Slight nausea 10. OTHER SYMPTOMS: "Do you have any other symptoms?" (e.g., fever, vomiting, diarrhea)       Diarrhea. Vomited all night per pt 11. PREGNANCY: "Is there any chance you are pregnant?" "When was your last menstrual period?"       n/a  Protocols used: ABDOMINAL PAIN - UPPER-A-AH

## 2017-05-01 ENCOUNTER — Encounter (HOSPITAL_COMMUNITY): Payer: Self-pay

## 2017-05-01 ENCOUNTER — Inpatient Hospital Stay (HOSPITAL_COMMUNITY)
Admission: EM | Admit: 2017-05-01 | Discharge: 2017-05-03 | DRG: 418 | Disposition: A | Discharge status: Home / Self Care | Payer: Medicare HMO | Attending: Internal Medicine | Admitting: Internal Medicine

## 2017-05-01 ENCOUNTER — Other Ambulatory Visit: Payer: Self-pay

## 2017-05-01 ENCOUNTER — Emergency Department (HOSPITAL_COMMUNITY): Payer: Medicare HMO

## 2017-05-01 ENCOUNTER — Inpatient Hospital Stay (HOSPITAL_COMMUNITY): Payer: Medicare HMO

## 2017-05-01 DIAGNOSIS — L03116 Cellulitis of left lower limb: Secondary | ICD-10-CM | POA: Diagnosis not present

## 2017-05-01 DIAGNOSIS — E119 Type 2 diabetes mellitus without complications: Secondary | ICD-10-CM | POA: Diagnosis present

## 2017-05-01 DIAGNOSIS — K8012 Calculus of gallbladder with acute and chronic cholecystitis without obstruction: Secondary | ICD-10-CM | POA: Diagnosis not present

## 2017-05-01 DIAGNOSIS — E039 Hypothyroidism, unspecified: Secondary | ICD-10-CM | POA: Diagnosis present

## 2017-05-01 DIAGNOSIS — K760 Fatty (change of) liver, not elsewhere classified: Secondary | ICD-10-CM | POA: Diagnosis not present

## 2017-05-01 DIAGNOSIS — K8066 Calculus of gallbladder and bile duct with acute and chronic cholecystitis without obstruction: Secondary | ICD-10-CM | POA: Diagnosis not present

## 2017-05-01 DIAGNOSIS — R112 Nausea with vomiting, unspecified: Secondary | ICD-10-CM

## 2017-05-01 DIAGNOSIS — Z8249 Family history of ischemic heart disease and other diseases of the circulatory system: Secondary | ICD-10-CM | POA: Diagnosis not present

## 2017-05-01 DIAGNOSIS — Z8261 Family history of arthritis: Secondary | ICD-10-CM | POA: Diagnosis not present

## 2017-05-01 DIAGNOSIS — K810 Acute cholecystitis: Secondary | ICD-10-CM | POA: Diagnosis not present

## 2017-05-01 DIAGNOSIS — J45909 Unspecified asthma, uncomplicated: Secondary | ICD-10-CM | POA: Diagnosis not present

## 2017-05-01 DIAGNOSIS — R791 Abnormal coagulation profile: Secondary | ICD-10-CM | POA: Diagnosis present

## 2017-05-01 DIAGNOSIS — E785 Hyperlipidemia, unspecified: Secondary | ICD-10-CM | POA: Diagnosis present

## 2017-05-01 DIAGNOSIS — J45901 Unspecified asthma with (acute) exacerbation: Secondary | ICD-10-CM | POA: Diagnosis not present

## 2017-05-01 DIAGNOSIS — E876 Hypokalemia: Secondary | ICD-10-CM | POA: Diagnosis not present

## 2017-05-01 DIAGNOSIS — R1110 Vomiting, unspecified: Secondary | ICD-10-CM | POA: Diagnosis not present

## 2017-05-01 DIAGNOSIS — Z803 Family history of malignant neoplasm of breast: Secondary | ICD-10-CM | POA: Diagnosis not present

## 2017-05-01 DIAGNOSIS — R000 Tachycardia, unspecified: Secondary | ICD-10-CM | POA: Diagnosis not present

## 2017-05-01 DIAGNOSIS — K439 Ventral hernia without obstruction or gangrene: Secondary | ICD-10-CM | POA: Diagnosis not present

## 2017-05-01 DIAGNOSIS — Z85038 Personal history of other malignant neoplasm of large intestine: Secondary | ICD-10-CM | POA: Diagnosis not present

## 2017-05-01 DIAGNOSIS — K8000 Calculus of gallbladder with acute cholecystitis without obstruction: Principal | ICD-10-CM | POA: Diagnosis present

## 2017-05-01 DIAGNOSIS — K8010 Calculus of gallbladder with chronic cholecystitis without obstruction: Secondary | ICD-10-CM | POA: Diagnosis not present

## 2017-05-01 DIAGNOSIS — Z820 Family history of epilepsy and other diseases of the nervous system: Secondary | ICD-10-CM | POA: Diagnosis not present

## 2017-05-01 DIAGNOSIS — R1013 Epigastric pain: Secondary | ICD-10-CM | POA: Diagnosis not present

## 2017-05-01 DIAGNOSIS — R197 Diarrhea, unspecified: Secondary | ICD-10-CM | POA: Diagnosis not present

## 2017-05-01 DIAGNOSIS — K8020 Calculus of gallbladder without cholecystitis without obstruction: Secondary | ICD-10-CM | POA: Diagnosis not present

## 2017-05-01 DIAGNOSIS — Z419 Encounter for procedure for purposes other than remedying health state, unspecified: Secondary | ICD-10-CM

## 2017-05-01 LAB — CBC
HCT: 39.2 % (ref 36.0–46.0)
Hemoglobin: 13.1 g/dL (ref 12.0–15.0)
MCH: 29.8 pg (ref 26.0–34.0)
MCHC: 33.4 g/dL (ref 30.0–36.0)
MCV: 89.3 fL (ref 78.0–100.0)
Platelets: 302 10*3/uL (ref 150–400)
RBC: 4.39 MIL/uL (ref 3.87–5.11)
RDW: 12.8 % (ref 11.5–15.5)
WBC: 16.5 10*3/uL — AB (ref 4.0–10.5)

## 2017-05-01 LAB — URINALYSIS, ROUTINE W REFLEX MICROSCOPIC
BILIRUBIN URINE: NEGATIVE
Glucose, UA: NEGATIVE mg/dL
Hgb urine dipstick: NEGATIVE
Ketones, ur: 20 mg/dL — AB
NITRITE: NEGATIVE
Protein, ur: NEGATIVE mg/dL
SPECIFIC GRAVITY, URINE: 1.015 (ref 1.005–1.030)
pH: 5 (ref 5.0–8.0)

## 2017-05-01 LAB — COMPREHENSIVE METABOLIC PANEL
ALBUMIN: 3 g/dL — AB (ref 3.5–5.0)
ALK PHOS: 113 U/L (ref 38–126)
ALT: 18 U/L (ref 14–54)
ANION GAP: 8 (ref 5–15)
AST: 19 U/L (ref 15–41)
BILIRUBIN TOTAL: 0.4 mg/dL (ref 0.3–1.2)
BUN: 8 mg/dL (ref 6–20)
CO2: 24 mmol/L (ref 22–32)
Calcium: 8.2 mg/dL — ABNORMAL LOW (ref 8.9–10.3)
Chloride: 104 mmol/L (ref 101–111)
Creatinine, Ser: 0.63 mg/dL (ref 0.44–1.00)
GFR calc Af Amer: >60 mL/min (ref >60)
GFR calc non Af Amer: >60 mL/min (ref >60)
GLUCOSE: 151 mg/dL — AB (ref 65–99)
Potassium: 3.7 mmol/L (ref 3.5–5.1)
Sodium: 136 mmol/L (ref 135–145)
TOTAL PROTEIN: 6.8 g/dL (ref 6.5–8.1)

## 2017-05-01 LAB — TROPONIN I

## 2017-05-01 LAB — I-STAT CHEM 8, ED
BUN: 6 mg/dL (ref 6–20)
CALCIUM ION: 1.05 mmol/L — AB (ref 1.15–1.40)
CREATININE: 0.5 mg/dL (ref 0.44–1.00)
Chloride: 102 mmol/L (ref 101–111)
GLUCOSE: 151 mg/dL — AB (ref 65–99)
HEMATOCRIT: 34 % — AB (ref 36.0–46.0)
HEMOGLOBIN: 11.6 g/dL — AB (ref 12.0–15.0)
Potassium: 3.5 mmol/L (ref 3.5–5.1)
Sodium: 137 mmol/L (ref 135–145)
TCO2: 25 mmol/L (ref 22–32)

## 2017-05-01 LAB — LIPASE, BLOOD: Lipase: 24 U/L (ref 11–51)

## 2017-05-01 LAB — D-DIMER, QUANTITATIVE: D-Dimer, Quant: 1.49 ug/mL-FEU — ABNORMAL HIGH (ref 0.00–0.50)

## 2017-05-01 LAB — GLUCOSE, CAPILLARY: GLUCOSE-CAPILLARY: 146 mg/dL — AB (ref 65–99)

## 2017-05-01 IMAGING — CT CT ABD-PELV W/ CM
2 of 5 series · 16 of 46 positions shown, 18 images · IV contrast (ISOVUE)
Comparison: Right upper quadrant ultrasound dated [DATE]

CLINICAL DATA: 71-year-old female with nausea vomiting.

EXAM:
CT ABDOMEN AND PELVIS WITH CONTRAST
TECHNIQUE: Multidetector CT imaging of the abdomen and pelvis was performed
using the standard protocol following bolus administration of
intravenous contrast.
CONTRAST:  100mL [0Y] IOPAMIDOL ([0Y]) INJECTION 61%,
<See Chart> [0Y] IOPAMIDOL ([0Y]) INJECTION 61%

[Series 2: axial st · axial · 0.90mm/px · z∈[-459,-49]mm · 13 of 94 slices shown, 15 images]
[im 6/94  soft-tissue]
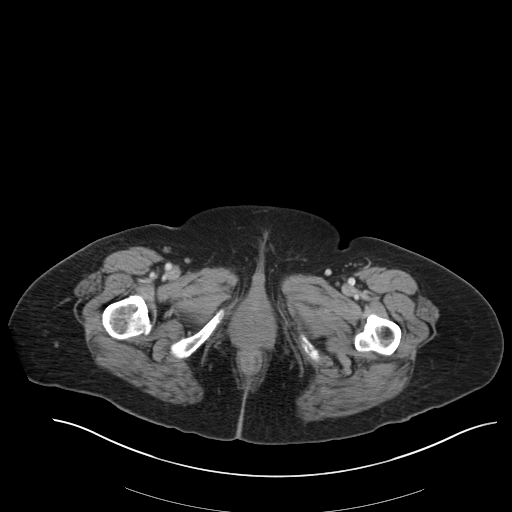
[im 6/94  bone]
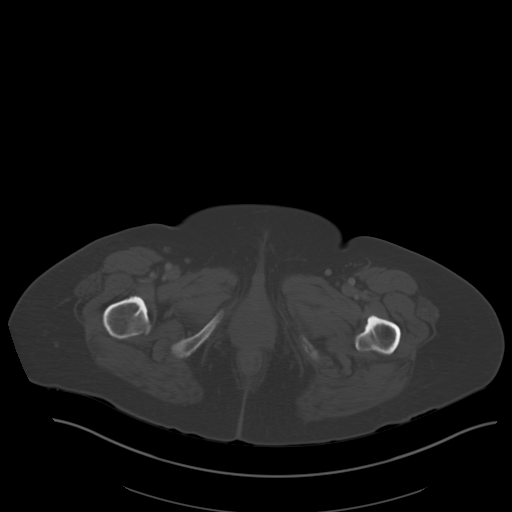
[im 12/94  soft-tissue]
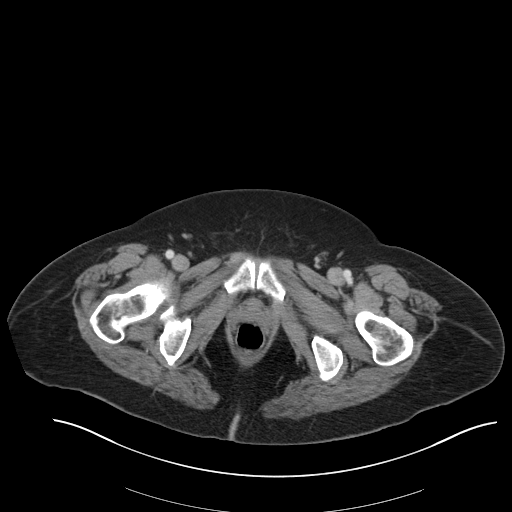
[im 18/94  soft-tissue]
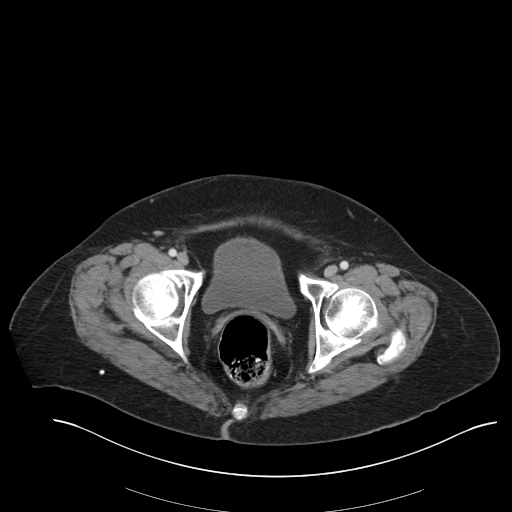
[im 30/94  soft-tissue]
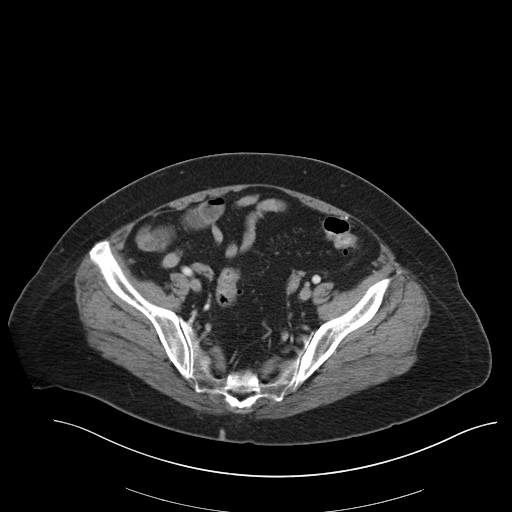
[im 35/94  soft-tissue]
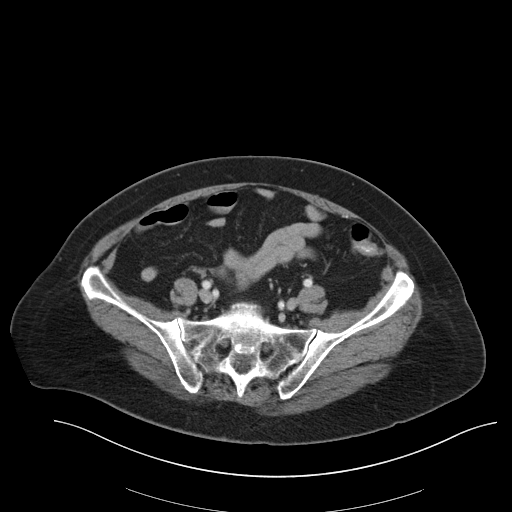
[im 41/94  soft-tissue]
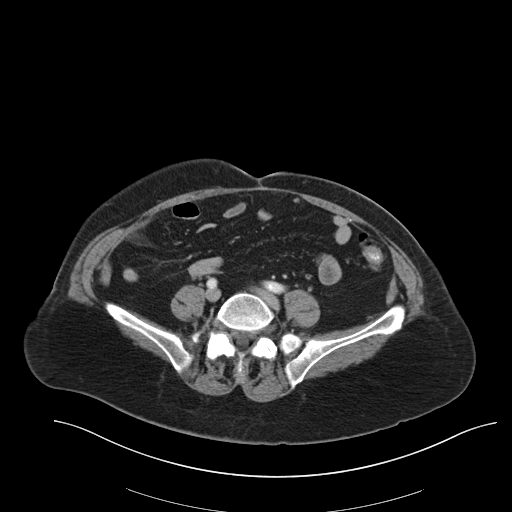
[im 47/94  soft-tissue]
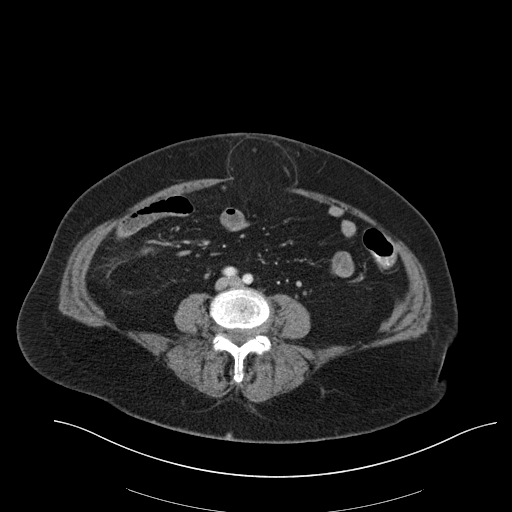
[im 53/94  soft-tissue]
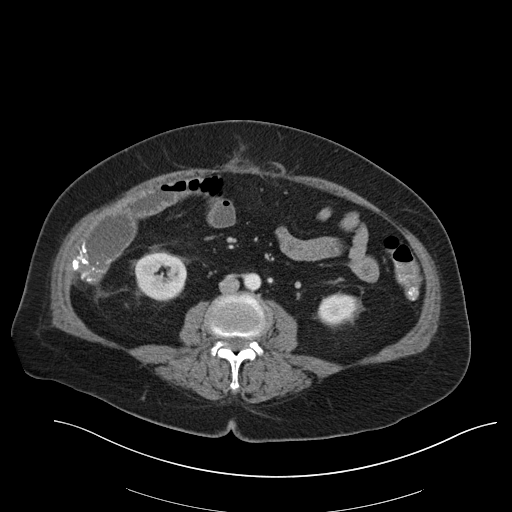
[im 59/94  soft-tissue]
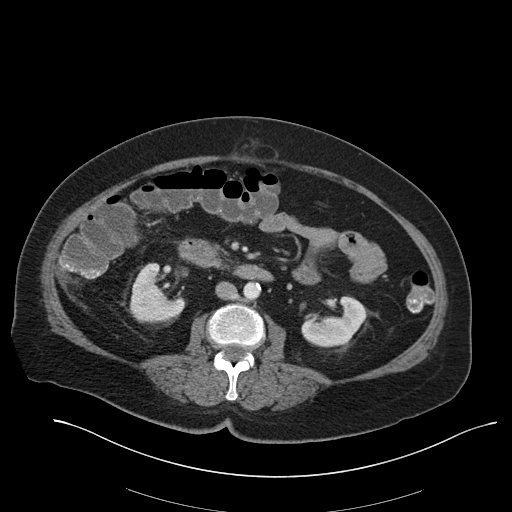
[im 59/94  bone]
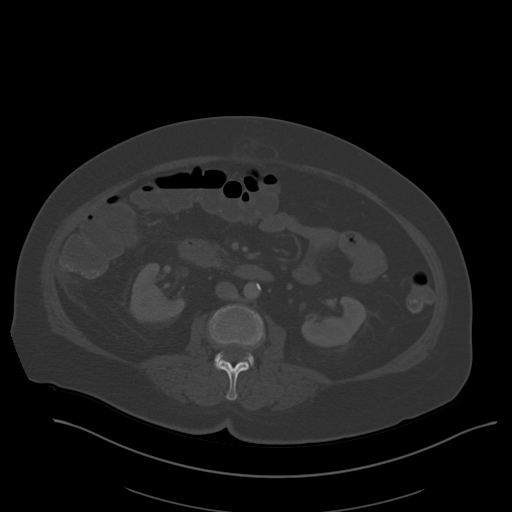
[im 64/94  soft-tissue]
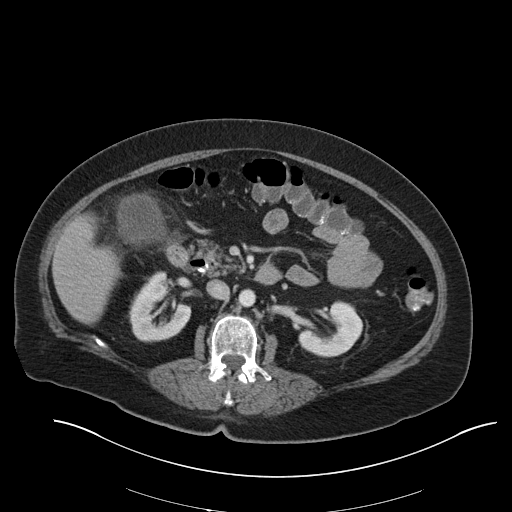
[im 76/94  soft-tissue]
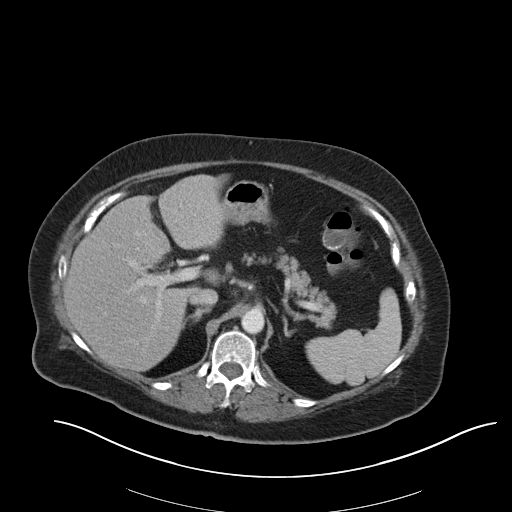
[im 82/94  soft-tissue]
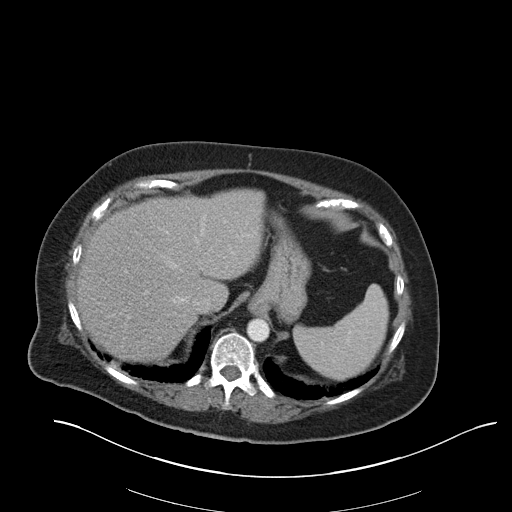
[im 88/94  soft-tissue]
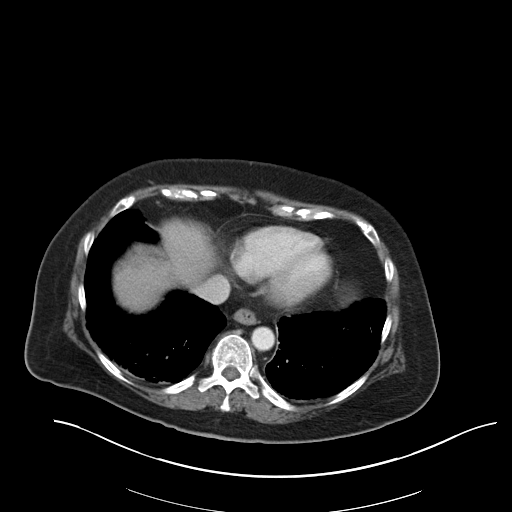

[Series 5: coronal st · coronal · 0.83mm/px · 3 of 101 slices shown]
[im 34/101  soft-tissue]
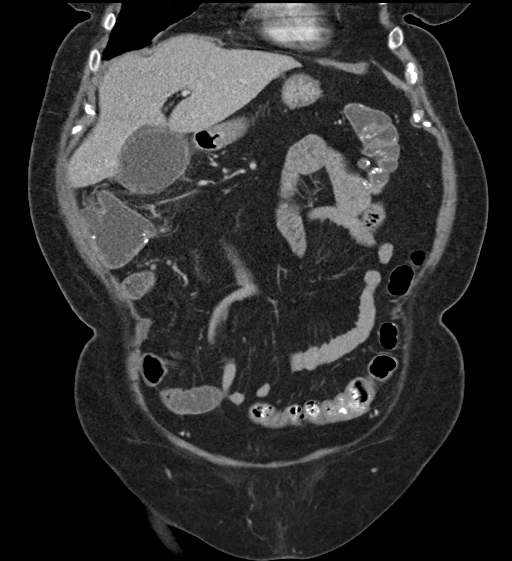
[im 45/101  soft-tissue]
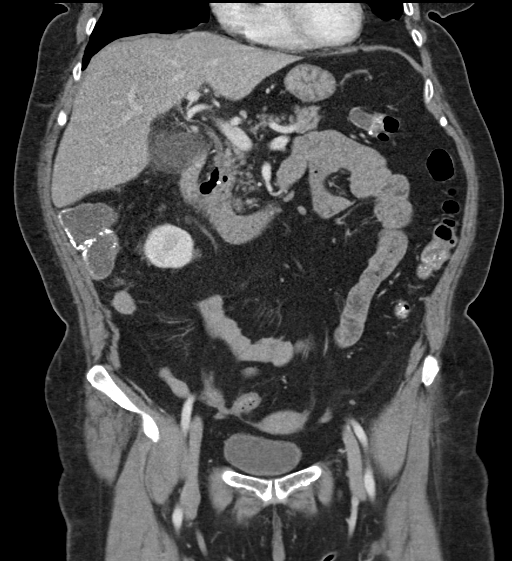
[im 56/101  soft-tissue]
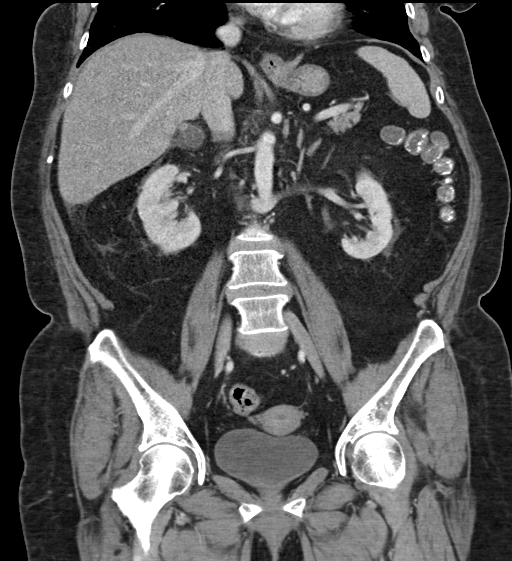

[16 of 46 positions shown; findings below may reference images not displayed]

FINDINGS: Lower chest: There bibasilar linear atelectasis/scarring.

No intra-abdominal free air or free fluid.

Hepatobiliary: The liver is unremarkable. No intrahepatic biliary
ductal dilatation. The gallbladder is distended. There is thickened
and inflamed appearance of the gallbladder wall with pericholecystic
stranding. At least two stones noted in the neck of the gallbladder
and region of the proximal cystic duct. Multiple calcific densities
in the head of the pancreas adjacent to the central CBD may be
related to prior pancreatitis and less likely represent stones
within the central CBD.

Pancreas: Coarse calcific foci of the head of the pancreas likely
sequela of prior pancreatitis. No active inflammatory changes. No
dilatation of the main pancreatic duct or gland atrophy.

Spleen: Normal in size without focal abnormality.

Adrenals/Urinary Tract: Adrenal glands are unremarkable. Kidneys are
normal, without renal calculi, focal lesion, or hydronephrosis.
Bladder is unremarkable.

Stomach/Bowel: There is loose stool throughout the colon compatible
with diarrheal state. Clinical correlation is recommended. There
postsurgical changes of right hemicolectomy with anastomotic suture
in the right upper quadrant. There is no bowel obstruction or active
inflammation. The appendix is surgically absent. Multiple small
sigmoid diverticula noted without active inflammatory changes.

Vascular/Lymphatic: The abdominal aorta and IVC appear unremarkable.
No portal venous gas. There is no adenopathy. There is a retroaortic
left renal vein anatomy.

Reproductive: The uterus is anteverted and grossly unremarkable. The
ovaries are unremarkable as well. No pelvic mass.

Other: There are 2 ventral/supraumbilical fat containing hernias
with the largest defect measure approximately 5 cm in transverse
axial diameter. No fluid collection or evidence of inflammatory
changes.

Musculoskeletal: There is degenerative changes of the spine. No
acute osseous pathology.
IMPRESSION: 1. Cholelithiasis with gallstones in the neck of the gallbladder and
proximal cystic duct and findings most consistent with acute
cholecystitis. Clinical correlation is recommended.
2. Diarrheal state.  No bowel obstruction or active inflammation.

## 2017-05-01 MED ORDER — ENOXAPARIN SODIUM 40 MG/0.4ML ~~LOC~~ SOLN
40.0000 mg | SUBCUTANEOUS | Status: DC
  Administered 2017-05-02: 40 mg via SUBCUTANEOUS
  Filled 2017-05-01: qty 0.4

## 2017-05-01 MED ORDER — IOPAMIDOL (ISOVUE-300) INJECTION 61%
INTRAVENOUS | Status: AC
  Filled 2017-05-01: qty 100

## 2017-05-01 MED ORDER — CIPROFLOXACIN IN D5W 400 MG/200ML IV SOLN
400.0000 mg | Freq: Once | INTRAVENOUS | Status: AC
  Administered 2017-05-01: 400 mg via INTRAVENOUS
  Filled 2017-05-01: qty 200

## 2017-05-01 MED ORDER — INSULIN ASPART 100 UNIT/ML ~~LOC~~ SOLN
0.0000 [IU] | SUBCUTANEOUS | Status: DC
  Administered 2017-05-01 – 2017-05-02 (×3): 1 [IU] via SUBCUTANEOUS
  Administered 2017-05-02: 2 [IU] via SUBCUTANEOUS
  Administered 2017-05-02: 3 [IU] via SUBCUTANEOUS
  Administered 2017-05-02: 2 [IU] via SUBCUTANEOUS
  Administered 2017-05-03: 3 [IU] via SUBCUTANEOUS
  Administered 2017-05-03 (×2): 2 [IU] via SUBCUTANEOUS
  Administered 2017-05-03: 3 [IU] via SUBCUTANEOUS

## 2017-05-01 MED ORDER — ONDANSETRON HCL 4 MG/2ML IJ SOLN: 4.0000 mg | Freq: Four times a day (QID) | INTRAMUSCULAR | Status: DC | PRN

## 2017-05-01 MED ORDER — SODIUM CHLORIDE 0.9 % IV SOLN
INTRAVENOUS | Status: AC
  Administered 2017-05-01: 21:00:00 via INTRAVENOUS

## 2017-05-01 MED ORDER — ACETAMINOPHEN 650 MG RE SUPP: 650.0000 mg | Freq: Four times a day (QID) | RECTAL | Status: DC | PRN

## 2017-05-01 MED ORDER — GI COCKTAIL ~~LOC~~
30.0000 mL | Freq: Once | ORAL | Status: AC
  Administered 2017-05-01: 30 mL via ORAL
  Filled 2017-05-01: qty 30

## 2017-05-01 MED ORDER — CIPROFLOXACIN IN D5W 400 MG/200ML IV SOLN
400.0000 mg | Freq: Two times a day (BID) | INTRAVENOUS | Status: DC
  Administered 2017-05-02 – 2017-05-03 (×3): 400 mg via INTRAVENOUS
  Filled 2017-05-01 (×3): qty 200

## 2017-05-01 MED ORDER — MONTELUKAST SODIUM 10 MG PO TABS
10.0000 mg | ORAL_TABLET | Freq: Every day | ORAL | Status: DC
  Administered 2017-05-01 – 2017-05-02 (×2): 10 mg via ORAL
  Filled 2017-05-01 (×2): qty 1

## 2017-05-01 MED ORDER — METRONIDAZOLE IN NACL 5-0.79 MG/ML-% IV SOLN
500.0000 mg | Freq: Three times a day (TID) | INTRAVENOUS | Status: DC
  Administered 2017-05-02 – 2017-05-03 (×5): 500 mg via INTRAVENOUS
  Filled 2017-05-01 (×5): qty 100

## 2017-05-01 MED ORDER — IOPAMIDOL (ISOVUE-300) INJECTION 61%
100.0000 mL | Freq: Once | INTRAVENOUS | Status: AC | PRN
  Administered 2017-05-01: 100 mL via INTRAVENOUS

## 2017-05-01 MED ORDER — SODIUM CHLORIDE 0.9 % IJ SOLN
INTRAMUSCULAR | Status: AC
  Filled 2017-05-01: qty 50

## 2017-05-01 MED ORDER — METRONIDAZOLE IN NACL 5-0.79 MG/ML-% IV SOLN
500.0000 mg | Freq: Once | INTRAVENOUS | Status: AC
  Administered 2017-05-01: 500 mg via INTRAVENOUS
  Filled 2017-05-01: qty 100

## 2017-05-01 MED ORDER — ONDANSETRON HCL 4 MG/2ML IJ SOLN
4.0000 mg | Freq: Once | INTRAMUSCULAR | Status: DC
  Filled 2017-05-01: qty 2

## 2017-05-01 MED ORDER — LEVOTHYROXINE SODIUM 25 MCG PO TABS
137.0000 ug | ORAL_TABLET | Freq: Every day | ORAL | Status: DC
  Administered 2017-05-03: 137 ug via ORAL
  Filled 2017-05-01: qty 1

## 2017-05-01 MED ORDER — HYDROMORPHONE HCL 1 MG/ML IJ SOLN
0.5000 mg | INTRAMUSCULAR | Status: DC | PRN
  Administered 2017-05-02: 0.5 mg via INTRAVENOUS
  Filled 2017-05-01 (×2): qty 0.5

## 2017-05-01 MED ORDER — SODIUM CHLORIDE 0.9 % IV BOLUS (SEPSIS): 1000.0000 mL | Freq: Once | INTRAVENOUS | Status: DC

## 2017-05-01 MED ORDER — ACETAMINOPHEN 325 MG PO TABS
650.0000 mg | ORAL_TABLET | Freq: Four times a day (QID) | ORAL | Status: DC | PRN
Start: 2017-05-01 — End: 2017-05-03

## 2017-05-01 NOTE — Progress Notes (Signed)
PHARMACY NOTE -  Ciprofloxacin  Pharmacy has been consulted to assist with dosing of Ciprofloxacin for cholecystitis.  In the ED, patient has received Ciprofloxacin 400mg  IV x 1 dose as well as Metronidazole 500mg  IV.  SCr: 0.5 CrCl: 69.3 ml/min  Plan: Ciprofloxacin 400mg  IV q12h Metronidazole per MD  Need for further dosage adjustment appears unlikely at present.    Will sign off at this time.  Please reconsult if a change in clinical status warrants re-evaluation of dosage.  Thank you, Leone Haven, PharmD

## 2017-05-01 NOTE — Telephone Encounter (Signed)
Pt checked into ED today.

## 2017-05-01 NOTE — ED Provider Notes (Signed)
[facility] DEPT Provider Note  CSN: [id number] Arrival date & time: 05/01/17 3151  Chief Complaint(s) Abdominal Pain; Emesis; and Diarrhea  HPI Jessica Farrell is a 72 y.o. female   The history is provided by the patient.  Abdominal Pain   This is a new problem. The current episode started 2 days ago. The problem occurs constantly. The problem has not changed since onset.The pain is associated with an unknown factor. The pain is located in the epigastric region. The quality of the pain is aching. The pain is moderate. Associated symptoms include diarrhea (approx 3 times per day; watery, NB. none today), nausea and vomiting (NBNB. no emesis since Sunday.). Pertinent negatives include fever. Nothing aggravates the symptoms. Nothing relieves the symptoms.  Emesis   Associated symptoms include abdominal pain and diarrhea (approx 3 times per day; watery, NB. none today). Pertinent negatives include no fever.  Diarrhea   Associated symptoms include abdominal pain and vomiting (NBNB. no emesis since Sunday.).   Currently being treated with Cefdinir for left lower leg and foot; improving. No known sick contact or suspicious food intake.   Past Medical History Past Medical History:  Diagnosis Date  . Asthma   . Cellulitis March  2014   Left foot and ankle  . colon ca dx'd 01/2014  . Colon polyps    2015   . Diabetes mellitus ([place]) 05/05/2012   Type II  . Hyperlipidemia   . Morbid obesity ([place])   . Thyroid disease 1990's   HypoThyroidism   Patient Active Problem List   Diagnosis Date Noted  . Carcinoid tumor of cecum 01/26/2017  . 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 ([place]) 02/18/2014  . Chronic venous insufficiency 08/02/2012  . HLD (hyperlipidemia) 05/15/2012  . Cellulitis of left anterior lower leg 05/05/2012  . Diabetes mellitus ([place]) 05/05/2012  . Hypothyroidism 05/05/2012  . Asthma 05/05/2012   Home Medication(s) Prior to  Admission medications   Medication Sig Start Date End Date Taking? Authorizing Provider  atorvastatin (LIPITOR) 10 MG tablet Take 1 tablet (10 mg total) by mouth daily. 04/04/17  Yes Burns, Claudina Lick, MD  Bismuth Subsalicylate (PEPTO-BISMOL PO) Take 2 tablets by mouth 2 (two) times daily as needed [facility] PAIN).   Yes [provider]  cefdinir (OMNICEF) 300 MG capsule Take 1 capsule (300 mg total) by mouth 2 (two) times daily. 04/21/17  Yes Janith Lima, MD  Dextromethorphan-Guaifenesin (ROBITUSSIN COUGH/CHEST DM MAX PO) Take 15 mLs by mouth daily as needed. 04/12/17  Yes [provider]  Dietary Management Product (VASCULERA) TABS Take 1 capsule by mouth daily. 04/21/17  Yes Janith Lima, MD  GuaiFENesin (MUCINEX PO) Take 400 mg by mouth daily as needed for congestion. 04/12/17  Yes [provider]  levothyroxine (SYNTHROID, LEVOTHROID) 137 MCG tablet Take 1 tablet (137 mcg total) by mouth daily before breakfast. 04/27/17  Yes Burns, Claudina Lick, MD  metFORMIN (GLUCOPHAGE) 500 MG tablet Take 1 tablet (500 mg total) by mouth 2 (two) times daily with a meal. 04/27/17  Yes Burns, Claudina Lick, MD  montelukast (SINGULAIR) 10 MG tablet Take 1 tablet (10 mg total) by mouth at bedtime. 04/12/17  Yes Burns, Claudina Lick, MD  Multiple Vitamins-Minerals (CENTRUM SILVER PO) Take 1 tablet by mouth daily.   Yes [provider]  Phenylephrine HCl (SINEX REGULAR NA) Place 1 spray into the nose daily as needed (ALLERGIES).   Yes [provider]  Albuterol Sulfate 108 (90 Base) MCG/ACT AEPB  Inhale 2 puffs into the lungs every 4 (four) hours as needed (wheezing/SOB). Reported on 06/07/2015 Patient not taking: Reported on 05/01/2017 06/07/15   Truitt Merle, MD  umeclidinium-vilanterol [facility] ELLIPTA) 62.5-25 MCG/INH AEPB Inhale 1 puff daily into the lungs. Patient not taking: Reported on 05/01/2017 01/05/17   Binnie Rail, MD                                                                                                                                     Past Surgical History Past Surgical History:  Procedure Laterality Date  . COLON RESECTION N/A 02/03/2014   Procedure: LAPAROSCOPIC ASSISTED BOWEL RESECTION;  Surgeon: Jackolyn Confer, MD;  Location: [facility] ORS;  Service: General;  Laterality: N/A;  . FRACTURE SURGERY  2000   Ankle  . TOOTH EXTRACTION     wisdom Teeth   Family History Family History  Problem Relation Age of Onset  . Alzheimer's disease Father   . Heart disease Mother   . Arthritis Mother   . Hypertension Mother   . Alzheimer's disease Sister   . Cancer Sister 40       breast cancer   . Heart disease Brother        Heart Disease before age 91  . Colon cancer Neg Hx   . Esophageal cancer Neg Hx   . Rectal cancer Neg Hx   . Stomach cancer Neg Hx     Social History Social History   Tobacco Use  . Smoking status: Never Smoker  . Smokeless tobacco: Never Used  Substance Use Topics  . Alcohol use: No  . Drug use: No   Allergies Penicillins  Review of Systems Review of Systems  Constitutional: Negative for fever.  Gastrointestinal: Positive for abdominal pain, diarrhea (approx 3 times per day; watery, NB. none today), nausea and vomiting (NBNB. no emesis since Sunday.).   All other systems are reviewed and are negative for acute change except as noted in the HPI  Physical Exam Vital Signs  I have reviewed the triage vital signs BP 122/80   Pulse (!) 105   Temp 98.7 F (37.1 C) (Oral)   Resp 18   Ht 5\' 5"  (1.651 m)   Wt 84.8 kg (187 lb)   SpO2 96%   BMI 31.12 kg/m   Physical Exam  Constitutional: She is oriented to person, place, and time. She appears well-developed and well-nourished. No distress.  HENT:  Head: Normocephalic and atraumatic.  Nose: Nose normal.  Eyes: Conjunctivae and EOM are normal. Pupils are equal, round, and reactive to light. Right eye exhibits no discharge. Left eye exhibits no discharge. No scleral icterus.  Neck:  Normal range of motion. Neck supple.  Cardiovascular: Normal rate and regular rhythm. Exam reveals no gallop and no friction rub.  No murmur heard. Pulmonary/Chest: Effort normal and breath sounds normal. No stridor. No respiratory distress. She has no rales.  Abdominal:  Soft. She exhibits no distension. There is tenderness (mild discomfort) in the right upper quadrant and epigastric area. There is no rigidity, no rebound, no guarding, no CVA tenderness and negative Murphy's sign.  Musculoskeletal: She exhibits no edema or tenderness.  Neurological: She is alert and oriented to person, place, and time.  Skin: Skin is warm and dry. No rash noted. She is not diaphoretic. No erythema.  Psychiatric: She has a normal mood and affect.  Vitals reviewed.   ED Results and Treatments Labs (all labs ordered are listed, but only abnormal results are displayed) Labs Reviewed  CBC - Abnormal; Notable for the following components:      Result Value   WBC 16.5 (*)    All other components within normal limits  URINALYSIS, ROUTINE W REFLEX MICROSCOPIC - Abnormal; Notable for the following components:   Ketones, ur 20 (*)    Leukocytes, UA SMALL (*)    Bacteria, UA RARE (*)    Squamous Epithelial / LPF 0-5 (*)    All other components within normal limits  COMPREHENSIVE METABOLIC PANEL - Abnormal; Notable for the following components:   Glucose, Bld 151 (*)    Calcium 8.2 (*)    Albumin 3.0 (*)    All other components within normal limits  I-STAT CHEM 8, ED - Abnormal; Notable for the following components:   Glucose, Bld 151 (*)    Calcium, Ion 1.05 (*)    Hemoglobin 11.6 (*)    HCT 34.0 (*)    All other components within normal limits  LIPASE, BLOOD                                                                                                                         EKG  EKG Interpretation  Date/Time:    Ventricular Rate:    PR Interval:    QRS Duration:   QT Interval:    QTC  Calculation:   R Axis:     Text Interpretation:        Radiology US Abdomen Limited Ruq  Result Date: 05/01/2017 CLINICAL DATA:  72 year old female with mid abdominal pain for 2 days. EXAM: ULTRASOUND ABDOMEN LIMITED RIGHT UPPER QUADRANT COMPARISON:  CT Abdomen and Pelvis 05/31/2015. FINDINGS: Gallbladder: There is a 6 millimeter gallstone identified in the gallbladder fundus. However, the gallbladder appears distended with wall thickening up to 7 millimeters (image 17). Despite this, no sonographic Murphy sign was elicited. No definite pericholecystic fluid. Common bile duct: Diameter: 5 millimeters, normal. Liver: Mildly increased liver echogenicity (image 59). No discrete liver lesion. No intrahepatic biliary ductal dilatation. Portal vein is patent on color Doppler imaging with normal direction of blood flow towards the liver. Other findings: Negative visible right kidney. IMPRESSION: 1. Positive for small gallstone(s), and a distended appearing gallbladder with wall thickening. The constellation is suspicious for Acute Cholecystitis, although no sonographic Murphy sign was elicited. 2. No evidence of acute biliary obstruction that no evidence of biliary ductal obstruction. 3.  Mild hepatic steatosis. Electronically Signed   By: Genevie Ann M.D.   On: 05/01/2017 16:24   Pertinent labs & imaging results that were available during my care of the patient were reviewed by me and considered in my medical decision making (see chart for details).  Medications Ordered in ED Medications  iopamidol (ISOVUE-300) 61 % injection 100 mL (not administered)  ciprofloxacin (CIPRO) IVPB 400 mg (not administered)  metroNIDAZOLE (FLAGYL) IVPB 500 mg (not administered)  gi cocktail (Maalox,Lidocaine,Donnatal) (30 mLs Oral Given 05/01/17 1257)                                                                                                                                    Procedures Procedures  (including critical  care time)  Medical Decision Making / ED Course I have reviewed the nursing notes for this encounter and the patient's prior records (if available in EHR or on provided paperwork).    Patient endorsing several days of epigastric abdominal pain with associated nausea, vomiting, diarrhea.  Vomiting and diarrhea seem to be improved however abdominal pain has persisted.  Patient is afebrile with stable vital signs.  Patient has epigastric abdominal pain without evidence of peritonitis.  CBC with significant leukocytosis of 16.5.   Workup revealed leukocytosis and evidence of acute cholecystitis.  Patient was given empiric antibiotics - Cipro/Flagyl due to penicillin allergy.  Spoke with surgery who will evaluate the patient in follow-up in consult.  They requested patient be admitted to medicine given her closed morbidities.  Final Clinical Impression(s) / ED Diagnoses Final diagnoses:  Epigastric pain  Acute cholecystitis      This chart was dictated using voice recognition software.  Despite best efforts to proofread,  errors can occur which can change the documentation meaning.   Fatima Blank, MD 05/01/17 [id number]

## 2017-05-01 NOTE — ED Triage Notes (Signed)
Patient c/omid upper abdominal pain, vomiting and diarrhea x 2 days.

## 2017-05-01 NOTE — ED Notes (Signed)
ED TO INPATIENT HANDOFF REPORT  Name/Age/Gender Jessica Farrell 72 y.o. female  Code Status Code Status History    Date Active Date Inactive Code Status Order ID Comments User Context   02/03/2014 14:43 02/08/2014 18:02 Full Code [id number]  Jackolyn Confer, MD Inpatient   01/30/2014 17:43 02/03/2014 14:42 Full Code [id number]  Rush Landmark Inpatient   05/05/2012 19:40 05/09/2012 18:40 Full Code [id number]  Monika Salk, MD Inpatient      Home/SNF/Other Home  Chief Complaint stomach pain, diarrhea  Level of Care/Admitting Diagnosis ED Disposition    ED Disposition Condition [facility] Area: [facility] [[id number]]  Level of Care: Telemetry [5]  Admit to tele based on following criteria: Monitor for Ischemic changes  Diagnosis: Acute cholecystitis [575.0.ICD-9-CM]  Admitting Physician: Jani Gravel [[id number]]  Attending Physician: Jani Gravel [phone]  Estimated length of stay: past midnight tomorrow  Certification:: I certify this patient will need inpatient services for at least 2 midnights  PT Class (Do Not Modify): Inpatient [101]  PT Acc Code (Do Not Modify): Private [1]       Medical History Past Medical History:  Diagnosis Date  . Asthma   . Cellulitis March  2014   Left foot and ankle  . colon ca dx'd 01/2014  . Colon polyps    2015   . Diabetes mellitus ([place]) 05/05/2012   Type II  . Hyperlipidemia   . Morbid obesity ([place])   . Thyroid disease 1990's   HypoThyroidism    Allergies Allergies  Allergen Reactions  . Penicillins Rash    CHILDHOOD ALLERGY Has patient had a PCN reaction causing immediate rash, facial/tongue/throat swelling, SOB or lightheadedness with hypotension: YeS Has patient had a PCN reaction causing severe rash involving mucus membranes or skin necrosis: Unknown Has patient had a PCN reaction that required hospitalization: Unknown Has patient had a PCN reaction occurring within the last 10 years:  Unknown If all of the above answers are "NO", then may proceed with Cephalosporin use.     IV Location/Drains/Wounds Patient Lines/Drains/Airways Status   Active Line/Drains/Airways    Name:   Placement date:   Placement time:   Site:   Days:   Peripheral IV 05/01/17 Right Hand   05/01/17    1308    Hand   less than 1   Incision (Closed) 02/03/14 Abdomen Other (Comment)   02/03/14    1308     1183   Incision - 2 Ports Abdomen 1: Left;Upper 2: Left;Mid   02/03/14    -     1183          Labs/Imaging Results for orders placed or performed during the hospital encounter of 05/01/17 (from the past 48 hour(s))  Urinalysis, Routine w reflex microscopic     Status: Abnormal   Collection Time: 05/01/17 12:32 PM  Result Value Ref Range   Color, Urine YELLOW YELLOW   APPearance CLEAR CLEAR   Specific Gravity, Urine 1.015 1.005 - 1.030   pH 5.0 5.0 - 8.0   Glucose, UA NEGATIVE NEGATIVE mg/dL   Hgb urine dipstick NEGATIVE NEGATIVE   Bilirubin Urine NEGATIVE NEGATIVE   Ketones, ur 20 (A) NEGATIVE mg/dL   Protein, ur NEGATIVE NEGATIVE mg/dL   Nitrite NEGATIVE NEGATIVE   Leukocytes, UA SMALL (A) NEGATIVE   RBC / HPF 0-5 0 - 5 RBC/hpf   WBC, UA 6-30 0 - 5 WBC/hpf   Bacteria, UA RARE (A) NONE SEEN  Squamous Epithelial / LPF 0-5 (A) NONE SEEN   Mucus PRESENT     Comment: Performed at [facility], [place] [address], [place], [place] [address]  CBC     Status: Abnormal   Collection Time: 05/01/17  1:08 PM  Result Value Ref Range   WBC 16.5 (H) 4.0 - 10.5 K/uL   RBC 4.39 3.87 - 5.11 MIL/uL   Hemoglobin 13.1 12.0 - 15.0 g/dL   HCT 39.2 36.0 - 46.0 %   MCV 89.3 78.0 - 100.0 fL   MCH 29.8 26.0 - 34.0 pg   MCHC 33.4 30.0 - 36.0 g/dL   RDW 12.8 11.5 - 15.5 %   Platelets 302 150 - 400 K/uL    Comment: Performed at [facility], [place] [address], [place], [place] [address]  Comprehensive metabolic panel     Status: Abnormal   Collection Time: 05/01/17  3:23  PM  Result Value Ref Range   Sodium 136 135 - 145 mmol/L   Potassium 3.7 3.5 - 5.1 mmol/L   Chloride 104 101 - 111 mmol/L   CO2 24 22 - 32 mmol/L   Glucose, Bld 151 (H) 65 - 99 mg/dL   BUN 8 6 - 20 mg/dL   Creatinine, Ser 0.63 0.44 - 1.00 mg/dL   Calcium 8.2 (L) 8.9 - 10.3 mg/dL   Total Protein 6.8 6.5 - 8.1 g/dL   Albumin 3.0 (L) 3.5 - 5.0 g/dL   AST 19 15 - 41 U/L   ALT 18 14 - 54 U/L   Alkaline Phosphatase 113 38 - 126 U/L   Total Bilirubin 0.4 0.3 - 1.2 mg/dL   GFR calc non Af Amer >60 >60 mL/min   GFR calc Af Amer >60 >60 mL/min    Comment: (NOTE) The eGFR has been calculated using the CKD EPI equation. This calculation has not been validated in all clinical situations. eGFR's persistently <60 mL/min signify possible Chronic Kidney Disease.    Anion gap 8 5 - 15    Comment: Performed at [facility], [place] [address] [place], [place] [address]  Lipase, blood     Status: None   Collection Time: 05/01/17  3:23 PM  Result Value Ref Range   Lipase 24 11 - 51 U/L    Comment: Performed at [facility], [place] [address] [place], [place] [address]  I-Stat Chem 8, ED     Status: Abnormal   Collection Time: 05/01/17  3:29 PM  Result Value Ref Range   Sodium 137 135 - 145 mmol/L   Potassium 3.5 3.5 - 5.1 mmol/L   Chloride 102 101 - 111 mmol/L   BUN 6 6 - 20 mg/dL   Creatinine, Ser 0.50 0.44 - 1.00 mg/dL   Glucose, Bld 151 (H) 65 - 99 mg/dL   Calcium, Ion 1.05 (L) 1.15 - 1.40 mmol/L   TCO2 25 22 - 32 mmol/L   Hemoglobin 11.6 (L) 12.0 - 15.0 g/dL   HCT 34.0 (L) 36.0 - 46.0 %   US Abdomen Limited Ruq  Result Date: 05/01/2017 CLINICAL DATA:  72 year old female with mid abdominal pain for 2 days. EXAM: ULTRASOUND ABDOMEN LIMITED RIGHT UPPER QUADRANT COMPARISON:  CT Abdomen and Pelvis 05/31/2015. FINDINGS: Gallbladder: There is a 6 millimeter gallstone identified in the gallbladder fundus. However, the gallbladder appears distended with wall  thickening up to 7 millimeters (image 17). Despite this, no sonographic Murphy sign was elicited. No definite pericholecystic fluid. Common bile  duct: Diameter: 5 millimeters, normal. Liver: Mildly increased liver echogenicity (image 59). No discrete liver lesion. No intrahepatic biliary ductal dilatation. Portal vein is patent on color Doppler imaging with normal direction of blood flow towards the liver. Other findings: Negative visible right kidney. IMPRESSION: 1. Positive for small gallstone(s), and a distended appearing gallbladder with wall thickening. The constellation is suspicious for Acute Cholecystitis, although no sonographic Murphy sign was elicited. 2. No evidence of acute biliary obstruction that no evidence of biliary ductal obstruction. 3. Mild hepatic steatosis. Electronically Signed   By: Genevie Ann M.D.   On: 05/01/2017 16:24    Pending Labs [employer] (From admission, onward)   Start     Ordered   Signed and Held  Creatinine, serum  (enoxaparin (LOVENOX)    CrCl >/= 30 ml/min)  Weekly,   R    Comments:  while on enoxaparin therapy    Signed and Held   Signed and Held  Comprehensive metabolic panel  Tomorrow morning,   R     Signed and Held   Signed and Held  CBC  Tomorrow morning,   R     Signed and Held   Signed and Held  Troponin I  Now then every 6 hours,   R     Signed and Held      Vitals/Pain Today's Vitals   05/01/17 0914 05/01/17 1224 05/01/17 1437 05/01/17 1814  BP:  122/80 134/85 120/77  Pulse:  (!) 105 100 99  Resp:  _0 Temp:      TempSrc:      SpO2:  96% 99% 96%  Weight: 187 lb (84.8 kg)     Height: _1  (1.651 m)     PainSc:        Isolation Precautions No active isolations  Medications Medications  iopamidol (ISOVUE-300) 61 % injection 100 mL (not administered)  insulin aspart (novoLOG) injection 0-9 Units (not administered)  ciprofloxacin (CIPRO) IVPB 400 mg (not administered)  gi cocktail (Maalox,Lidocaine,Donnatal) (30 mLs Oral  Given 05/01/17 1257)  ciprofloxacin (CIPRO) IVPB 400 mg (0 mg Intravenous Stopped 05/01/17 1927)  metroNIDAZOLE (FLAGYL) IVPB 500 mg (0 mg Intravenous Stopped 05/01/17 1816)    Mobility walks

## 2017-05-01 NOTE — ED Provider Notes (Signed)
72 year old female here with abdominal pain and vomiting.  Lab work shows significant leukocytosis.  Imaging is consistent with acute cholecystitis.  Surgical consult obtained and is recommending medicine admission given her multiple comorbidities.  Patient given IV antibiotics.     Duffy Bruce, MD 05/01/17 [phone]

## 2017-05-01 NOTE — ED Notes (Signed)
Lab called- need a recollect on light green it hemolyzed. RN made aware.

## 2017-05-01 NOTE — H&P (Signed)
TRH H&P   Patient Demographics:    Jessica Farrell, is a 72 y.o. female  MRN: [id number]   DOB - 1946/02/05  Admit Date - 05/01/2017  Outpatient Primary MD for the patient is Binnie Rail, MD  Referring MD/NP/PA: Lindell Noe  Outpatient Specialists:    Dr. Bertha Stakes (surgery)  Patient coming from: home  Chief Complaint  Patient presents with  . Abdominal Pain  . Emesis  . Diarrhea      HPI:    Jessica Farrell  is a 72 y.o. female, w hyperlipidemia, dm2, asthma , h/o colon cancer s/p resection, w cellulitis on the lower ext (tx with cefdinir), apparently c/o n/v since Sunday.  Pt notes abdominal pain in the upper abdomen since Sunday as well.  Pt denies fever, chills, cp, palp, sob, cough, diarrhea, brbpr, black stool.  Pt presented to ED due to continued abdominal pain.   In ED,  RUQ ultrasound IMPRESSION: 1. Positive for small gallstone(s), and a distended appearing gallbladder with wall thickening. The constellation is suspicious for Acute Cholecystitis, although no sonographic Murphy sign was elicited. 2. No evidence of acute biliary obstruction that no evidence of biliary ductal obstruction. 3. Mild hepatic steatosis.  Urinalysis,  Wbc 6-30, rbc 0-5  Wbc 16.5, Hgb 13.1, Plt 302 Glucose 151, Bun 8, Creatinine 0.63 Alb 3.0 Lipase 24,  Ast 19, Alt 18, Alk phos 113, T. Bili 0.4  ED consulted surgery , (Dr. Marlou Starks) , appreciate input Pt will be admitted for possible acute cholecystitis     Review of systems:    In addition to the HPI above,  No Fever-chills, No Headache, No changes with Vision or hearing, No problems swallowing food or Liquids, No Chest pain, Cough or Shortness of Breath, No Blood in stool or Urine, No dysuria, No new skin rashes or bruises, No new joints pains-aches,  No new weakness, tingling, numbness in any  extremity, No recent weight gain or loss, No polyuria, polydypsia or polyphagia, No significant Mental Stressors.  A full 10 point Review of Systems was done, except as stated above, all other Review of Systems were negative.   With Past History of the following :    Past Medical History:  Diagnosis Date  . Asthma   . Cellulitis March  2014   Left foot and ankle  . colon ca dx'd 01/2014  . Colon polyps    2015   . Diabetes mellitus ([place]) 05/05/2012   Type II  . Hyperlipidemia   . Morbid obesity ([place])   . Thyroid disease 1990's   HypoThyroidism      Past Surgical History:  Procedure Laterality Date  . COLON RESECTION N/A 02/03/2014   Procedure: LAPAROSCOPIC ASSISTED BOWEL RESECTION;  Surgeon: Jackolyn Confer, MD;  Location: [facility];  Service: General;  Laterality: N/A;  . FRACTURE SURGERY  2000   Ankle  . TOOTH  EXTRACTION     wisdom Teeth      Social History:     Social History   Tobacco Use  . Smoking status: Never Smoker  . Smokeless tobacco: Never Used  Substance Use Topics  . Alcohol use: No     Lives - at home  Mobility - walks by self   Family History :     Family History  Problem Relation Age of Onset  . Alzheimer's disease Father   . Heart disease Mother   . Arthritis Mother   . Hypertension Mother   . Alzheimer's disease Sister   . Cancer Sister 49       breast cancer   . Heart disease Brother        Heart Disease before age 46  . Colon cancer Neg Hx   . Esophageal cancer Neg Hx   . Rectal cancer Neg Hx   . Stomach cancer Neg Hx       Home Medications:   Prior to Admission medications   Medication Sig Start Date End Date Taking? Authorizing Provider  atorvastatin (LIPITOR) 10 MG tablet Take 1 tablet (10 mg total) by mouth daily. 04/04/17  Yes Burns, Claudina Lick, MD  Bismuth Subsalicylate (PEPTO-BISMOL PO) Take 2 tablets by mouth 2 (two) times daily as needed [facility] PAIN).   Yes [provider]  cefdinir (OMNICEF) 300 MG  capsule Take 1 capsule (300 mg total) by mouth 2 (two) times daily. 04/21/17  Yes Janith Lima, MD  Dextromethorphan-Guaifenesin (ROBITUSSIN COUGH/CHEST DM MAX PO) Take 15 mLs by mouth daily as needed. 04/12/17  Yes [provider]  Dietary Management Product (VASCULERA) TABS Take 1 capsule by mouth daily. 04/21/17  Yes Janith Lima, MD  GuaiFENesin (MUCINEX PO) Take 400 mg by mouth daily as needed for congestion. 04/12/17  Yes [provider]  levothyroxine (SYNTHROID, LEVOTHROID) 137 MCG tablet Take 1 tablet (137 mcg total) by mouth daily before breakfast. 04/27/17  Yes Burns, Claudina Lick, MD  metFORMIN (GLUCOPHAGE) 500 MG tablet Take 1 tablet (500 mg total) by mouth 2 (two) times daily with a meal. 04/27/17  Yes Burns, Claudina Lick, MD  montelukast (SINGULAIR) 10 MG tablet Take 1 tablet (10 mg total) by mouth at bedtime. 04/12/17  Yes Burns, Claudina Lick, MD  Multiple Vitamins-Minerals (CENTRUM SILVER PO) Take 1 tablet by mouth daily.   Yes [provider]  Phenylephrine HCl (SINEX REGULAR NA) Place 1 spray into the nose daily as needed (ALLERGIES).   Yes [provider]  Albuterol Sulfate 108 (90 Base) MCG/ACT AEPB Inhale 2 puffs into the lungs every 4 (four) hours as needed (wheezing/SOB). Reported on 06/07/2015 Patient not taking: Reported on 05/01/2017 06/07/15   Truitt Merle, MD  umeclidinium-vilanterol [facility] ELLIPTA) 62.5-25 MCG/INH AEPB Inhale 1 puff daily into the lungs. Patient not taking: Reported on 05/01/2017 01/05/17   Binnie Rail, MD     Allergies:     Allergies  Allergen Reactions  . Penicillins Rash    CHILDHOOD ALLERGY Has patient had a PCN reaction causing immediate rash, facial/tongue/throat swelling, SOB or lightheadedness with hypotension: YeS Has patient had a PCN reaction causing severe rash involving mucus membranes or skin necrosis: Unknown Has patient had a PCN reaction that required hospitalization: Unknown Has patient had a PCN reaction occurring  within the last 10 years: Unknown If all of the above answers are "NO", then may proceed with Cephalosporin use.      Physical Exam:  Vitals  Blood pressure 120/77, pulse 99, temperature 98.7 F (37.1 C), temperature source Oral, resp. rate 20, height 5' 5" (1.651 m), weight 84.8 kg (187 lb), SpO2 96 %.   1. General  lying in bed in NAD,    2. Normal affect and insight, Not Suicidal or Homicidal, Awake Alert, Oriented X 3.  3. No F.N deficits, ALL C.Nerves Intact, Strength 5/5 all 4 extremities, Sensation intact all 4 extremities, Plantars down going.  4. Ears and Eyes appear Normal, Conjunctivae clear, PERRLA. Moist Oral Mucosa.  5. Supple Neck, No JVD, No cervical lymphadenopathy appriciated, No Carotid Bruits.  6. Symmetrical Chest wall movement, Good air movement bilaterally, CTAB.  7. RRR, No Gallops, Rubs or Murmurs, No Parasternal Heave.  8. Positive Bowel Sounds, Abdomen Soft, No tenderness, No organomegaly appriciated,No rebound -guarding or rigidity.  9.  No Cyanosis, Normal Skin Turgor, No Skin Rash or Bruise.  10. Good muscle tone,  joints appear normal , no effusions, Normal ROM.  11. No Palpable Lymph Nodes in Neck or Axillae     Data Review:    CBC Recent Labs  Lab 05/01/17 1308 05/01/17 1529  WBC 16.5*  --   HGB 13.1 11.6*  HCT 39.2 34.0*  PLT 302  --   MCV 89.3  --   MCH 29.8  --   MCHC 33.4  --   RDW 12.8  --    ------------------------------------------------------------------------------------------------------------------  Chemistries  Recent Labs  Lab 05/01/17 1523 05/01/17 1529  NA 136 137  K 3.7 3.5  CL 104 102  CO2 24  --   GLUCOSE 151* 151*  BUN 8 6  CREATININE 0.63 0.50  CALCIUM 8.2*  --   AST 19  --   ALT 18  --   ALKPHOS 113  --   BILITOT 0.4  --    ------------------------------------------------------------------------------------------------------------------ estimated creatinine clearance is 69.3 mL/min (by  C-G formula based on SCr of 0.5 mg/dL). ------------------------------------------------------------------------------------------------------------------ No results for input(s): TSH, T4TOTAL, T3FREE, THYROIDAB in the last 72 hours.  Invalid input(s): FREET3  Coagulation profile No results for input(s): INR, PROTIME in the last 168 hours. ------------------------------------------------------------------------------------------------------------------- No results for input(s): DDIMER in the last 72 hours. -------------------------------------------------------------------------------------------------------------------  Cardiac Enzymes No results for input(s): CKMB, TROPONINI, MYOGLOBIN in the last 168 hours.  Invalid input(s): CK ------------------------------------------------------------------------------------------------------------------ No results found for: BNP   ---------------------------------------------------------------------------------------------------------------  Urinalysis    Component Value Date/Time   COLORURINE YELLOW 05/01/2017 1232   APPEARANCEUR CLEAR 05/01/2017 1232   LABSPEC 1.015 05/01/2017 1232   PHURINE 5.0 05/01/2017 1232   GLUCOSEU NEGATIVE 05/01/2017 1232   HGBUR NEGATIVE 05/01/2017 1232   BILIRUBINUR NEGATIVE 05/01/2017 1232   BILIRUBINUR negative 02/18/2016 1556   KETONESUR 20 (A) 05/01/2017 1232   PROTEINUR NEGATIVE 05/01/2017 1232   UROBILINOGEN negative 02/18/2016 1556   UROBILINOGEN 0.2 01/30/2014 1227   NITRITE NEGATIVE 05/01/2017 1232   LEUKOCYTESUR SMALL (A) 05/01/2017 1232    ----------------------------------------------------------------------------------------------------------------   Imaging Results:    Us Abdomen Limited Ruq  Result Date: 05/01/2017 CLINICAL DATA:  71-year-old female with mid abdominal pain for 2 days. EXAM: ULTRASOUND ABDOMEN LIMITED RIGHT UPPER QUADRANT COMPARISON:  CT Abdomen and Pelvis 05/31/2015.  FINDINGS: Gallbladder: There is a 6 millimeter gallstone identified in the gallbladder fundus. However, the gallbladder appears distended with wall thickening up to 7 millimeters (image 17). Despite this, no sonographic Murphy sign was elicited. No definite pericholecystic fluid. Common bile duct: Diameter: 5 millimeters, normal. Liver: Mildly increased liver echogenicity (image 59).   No discrete liver lesion. No intrahepatic biliary ductal dilatation. Portal vein is patent on color Doppler imaging with normal direction of blood flow towards the liver. Other findings: Negative visible right kidney. IMPRESSION: 1. Positive for small gallstone(s), and a distended appearing gallbladder with wall thickening. The constellation is suspicious for Acute Cholecystitis, although no sonographic Murphy sign was elicited. 2. No evidence of acute biliary obstruction that no evidence of biliary ductal obstruction. 3. Mild hepatic steatosis. Electronically Signed   By: H  Hall M.D.   On: 05/01/2017 16:24      Assessment & Plan:    Principal Problem:   Acute cholecystitis Active Problems:   Tachycardia    Abdominal pain CT scan abd/ pelvis w oral and iv contrast NPO Cipro/ Flagyl iv pharmacy to dose => will defer to surgery regarding IV Abx Ns at 75mL per hour iv  General surgery consulted, appreciate input  Tachycardia 12 lead ekg Tele D dimer, if positive then consider CTA  Trop I q6h x3, TSH If not improving then consider cardiac echo,   Hyperlipidemia Cont lipitor 10mg po qhs  Dm2 STOP Metformin  Hypothyroidism Cont levothyroxine  Check TSH  Cellulitis (lower ext) Will monitor, pt states resolved ?  DVT Prophylaxis  Lovenox - SCDs  AM Labs Ordered, also please review Full Orders  Family Communication: Admission, patients condition and plan of care including tests being ordered have been discussed with the patient  who indicate understanding and agree with the plan and Code  Status.  Code Status  FULL CODE  Likely DC to  home  Condition GUARDED    Consults called: surgery  Admission status: inpatient  Time spent in minutes : 45     M.D on 05/01/2017 at 6:17 PM  Between 7am to 7pm - Pager - [phone]    After 7pm go to [email or web address] - password [email or web address]  [facility] - Office  [phone]    

## 2017-05-01 NOTE — Consult Note (Signed)
Chief Complaint:  Midabdominal pain and gallstones  History of Present Illness:  Jessica Farrell is an 72 y.o. female who presented to the ER with midepigastric pain and hx of some vomiting at home.  Ultrasound showed a 6 mm gallstone with some wall thickening but no pericholecystic fluid.  She was seen by me with Dr. Maudie Mercury in the ER.  Will get CT of abdomen because of her history of colon cancer treated by Dr. Zella Richer in 2015.    She has had no prior upper abdominal surgery and I think that she could undergo lap chole tomorrow.  Will keep NPO after midnight.   Past Medical History:  Diagnosis Date  . Asthma   . Cellulitis March  2014   Left foot and ankle  . colon ca dx'd 01/2014  . Colon polyps    2015   . Diabetes mellitus ([place]) 05/05/2012   Type II  . Hyperlipidemia   . Morbid obesity ([place])   . Thyroid disease 1990's   HypoThyroidism    Past Surgical History:  Procedure Laterality Date  . COLON RESECTION N/A 02/03/2014   Procedure: LAPAROSCOPIC ASSISTED BOWEL RESECTION;  Surgeon: Jackolyn Confer, MD;  Location: [facility];  Service: General;  Laterality: N/A;  . FRACTURE SURGERY  2000   Ankle  . TOOTH EXTRACTION     wisdom Teeth    Current Facility-Administered Medications  Medication Dose Route Frequency Provider Last Rate Last Dose  . [START ON 05/02/2017] ciprofloxacin (CIPRO) IVPB 400 mg  400 mg Intravenous Q12H Poindexter, Leann T, RPH      . insulin aspart (novoLOG) injection 0-9 Units  0-9 Units Subcutaneous Q4H Jani Gravel, MD      . iopamidol (ISOVUE-300) 61 % injection 100 mL  100 mL Intravenous Once PRN Cardama, Grayce Sessions, MD       Current Outpatient Medications  Medication Sig Dispense Refill  . atorvastatin (LIPITOR) 10 MG tablet Take 1 tablet (10 mg total) by mouth daily. 90 tablet 1  . Bismuth Subsalicylate (PEPTO-BISMOL PO) Take 2 tablets by mouth 2 (two) times daily as needed [facility] PAIN).    Marland Kitchen cefdinir (OMNICEF) 300 MG capsule Take 1 capsule (300 mg  total) by mouth 2 (two) times daily. 20 capsule 0  . Dextromethorphan-Guaifenesin (ROBITUSSIN COUGH/CHEST DM MAX PO) Take 15 mLs by mouth daily as needed.    . Dietary Management Product (VASCULERA) TABS Take 1 capsule by mouth daily. 30 tablet 11  . GuaiFENesin (MUCINEX PO) Take 400 mg by mouth daily as needed for congestion.    Marland Kitchen levothyroxine (SYNTHROID, LEVOTHROID) 137 MCG tablet Take 1 tablet (137 mcg total) by mouth daily before breakfast. 90 tablet 0  . metFORMIN (GLUCOPHAGE) 500 MG tablet Take 1 tablet (500 mg total) by mouth 2 (two) times daily with a meal. 180 tablet 1  . montelukast (SINGULAIR) 10 MG tablet Take 1 tablet (10 mg total) by mouth at bedtime. 90 tablet 1  . Multiple Vitamins-Minerals (CENTRUM SILVER PO) Take 1 tablet by mouth daily.    Marland Kitchen Phenylephrine HCl (SINEX REGULAR NA) Place 1 spray into the nose daily as needed (ALLERGIES).    . Albuterol Sulfate 108 (90 Base) MCG/ACT AEPB Inhale 2 puffs into the lungs every 4 (four) hours as needed (wheezing/SOB). Reported on 06/07/2015 (Patient not taking: Reported on 05/01/2017) 1 each 0  . umeclidinium-vilanterol (ANORO ELLIPTA) 62.5-25 MCG/INH AEPB Inhale 1 puff daily into the lungs. (Patient not taking: Reported on 05/01/2017) 60 each 5   Penicillins  Family History  Problem Relation Age of Onset  . Alzheimer's disease Father   . Heart disease Mother   . Arthritis Mother   . Hypertension Mother   . Alzheimer's disease Sister   . Cancer Sister 30       breast cancer   . Heart disease Brother        Heart Disease before age 38  . Colon cancer Neg Hx   . Esophageal cancer Neg Hx   . Rectal cancer Neg Hx   . Stomach cancer Neg Hx    Social History:   reports that  has never smoked. she has never used smokeless tobacco. She reports that she does not drink alcohol or use drugs.   REVIEW OF SYSTEMS : Negative except for problem list as see above.  She is followed in the cancer center by Dr. Morey Hummingbird  Physical Exam:   Blood  pressure 120/77, pulse 99, temperature 98.7 F (37.1 C), temperature source Oral, resp. rate 20, height '5\' 5"'$  (1.651 m), weight 84.8 kg (187 lb), SpO2 96 %. Body mass index is 31.12 kg/m.  Gen:  WDWN WF NAD  Neurological: Alert and oriented to person, place, and time. Motor and sensory function is grossly intact  Head: Normocephalic and atraumatic.   Abdomen:  Tenderness in the upper abdomen GU:  Not remarkable Musculoskeletal: Normal range of motion. Extremities are nontender. No cyanosis, edema or clubbing noted Lymphadenopathy: No cervical, preauricular, postauricular or axillary adenopathy is present Skin: Skin is warm and dry. No rash noted. No diaphoresis. No erythema. No pallor. Pscyh: Normal mood and affect. Behavior is normal. Judgment and thought content normal.   LABORATORY RESULTS: Results for orders placed or performed during the hospital encounter of 05/01/17 (from the past 48 hour(s))  Urinalysis, Routine w reflex microscopic     Status: Abnormal   Collection Time: 05/01/17 12:32 PM  Result Value Ref Range   Color, Urine YELLOW YELLOW   APPearance CLEAR CLEAR   Specific Gravity, Urine 1.015 1.005 - 1.030   pH 5.0 5.0 - 8.0   Glucose, UA NEGATIVE NEGATIVE mg/dL   Hgb urine dipstick NEGATIVE NEGATIVE   Bilirubin Urine NEGATIVE NEGATIVE   Ketones, ur 20 (A) NEGATIVE mg/dL   Protein, ur NEGATIVE NEGATIVE mg/dL   Nitrite NEGATIVE NEGATIVE   Leukocytes, UA SMALL (A) NEGATIVE   RBC / HPF 0-5 0 - 5 RBC/hpf   WBC, UA 6-30 0 - 5 WBC/hpf   Bacteria, UA RARE (A) NONE SEEN   Squamous Epithelial / LPF 0-5 (A) NONE SEEN   Mucus PRESENT     Comment: Performed at [facility], [place] [address], [place], [place] [address]  CBC     Status: Abnormal   Collection Time: 05/01/17  1:08 PM  Result Value Ref Range   WBC 16.5 (H) 4.0 - 10.5 K/uL   RBC 4.39 3.87 - 5.11 MIL/uL   Hemoglobin 13.1 12.0 - 15.0 g/dL   HCT 39.2 36.0 - 46.0 %   MCV 89.3 78.0 - 100.0 fL   MCH  29.8 26.0 - 34.0 pg   MCHC 33.4 30.0 - 36.0 g/dL   RDW 12.8 11.5 - 15.5 %   Platelets 302 150 - 400 K/uL    Comment: Performed at [facility], [place] [address], [place], [place] [address]  Comprehensive metabolic panel     Status: Abnormal   Collection Time: 05/01/17  3:23 PM  Result Value Ref Range   Sodium 136  135 - 145 mmol/L   Potassium 3.7 3.5 - 5.1 mmol/L   Chloride 104 101 - 111 mmol/L   CO2 24 22 - 32 mmol/L   Glucose, Bld 151 (H) 65 - 99 mg/dL   BUN 8 6 - 20 mg/dL   Creatinine, Ser 0.63 0.44 - 1.00 mg/dL   Calcium 8.2 (L) 8.9 - 10.3 mg/dL   Total Protein 6.8 6.5 - 8.1 g/dL   Albumin 3.0 (L) 3.5 - 5.0 g/dL   AST 19 15 - 41 U/L   ALT 18 14 - 54 U/L   Alkaline Phosphatase 113 38 - 126 U/L   Total Bilirubin 0.4 0.3 - 1.2 mg/dL   GFR calc non Af Amer >60 >60 mL/min   GFR calc Af Amer >60 >60 mL/min    Comment: (NOTE) The eGFR has been calculated using the CKD EPI equation. This calculation has not been validated in all clinical situations. eGFR's persistently <60 mL/min signify possible Chronic Kidney Disease.    Anion gap 8 5 - 15    Comment: Performed at [facility], [place] [address], [place], [place] [address]  Lipase, blood     Status: None   Collection Time: 05/01/17  3:23 PM  Result Value Ref Range   Lipase 24 11 - 51 U/L    Comment: Performed at [facility], [place] [address], [place], [place] [address]  I-Stat Chem 8, ED     Status: Abnormal   Collection Time: 05/01/17  3:29 PM  Result Value Ref Range   Sodium 137 135 - 145 mmol/L   Potassium 3.5 3.5 - 5.1 mmol/L   Chloride 102 101 - 111 mmol/L   BUN 6 6 - 20 mg/dL   Creatinine, Ser 0.50 0.44 - 1.00 mg/dL   Glucose, Bld 151 (H) 65 - 99 mg/dL   Calcium, Ion 1.05 (L) 1.15 - 1.40 mmol/L   TCO2 25 22 - 32 mmol/L   Hemoglobin 11.6 (L) 12.0 - 15.0 g/dL   HCT 34.0 (L) 36.0 - 46.0 %     RADIOLOGY RESULTS: US Abdomen Limited Ruq  Result Date:  05/01/2017 CLINICAL DATA:  72 year old female with mid abdominal pain for 2 days. EXAM: ULTRASOUND ABDOMEN LIMITED RIGHT UPPER QUADRANT COMPARISON:  CT Abdomen and Pelvis 05/31/2015. FINDINGS: Gallbladder: There is a 6 millimeter gallstone identified in the gallbladder fundus. However, the gallbladder appears distended with wall thickening up to 7 millimeters (image 17). Despite this, no sonographic Murphy sign was elicited. No definite pericholecystic fluid. Common bile duct: Diameter: 5 millimeters, normal. Liver: Mildly increased liver echogenicity (image 59). No discrete liver lesion. No intrahepatic biliary ductal dilatation. Portal vein is patent on color Doppler imaging with normal direction of blood flow towards the liver. Other findings: Negative visible right kidney. IMPRESSION: 1. Positive for small gallstone(s), and a distended appearing gallbladder with wall thickening. The constellation is suspicious for Acute Cholecystitis, although no sonographic Murphy sign was elicited. 2. No evidence of acute biliary obstruction that no evidence of biliary ductal obstruction. 3. Mild hepatic steatosis. Electronically Signed   By: Genevie Ann M.D.   On: 05/01/2017 16:24    Problem List: Patient Active Problem List   Diagnosis Date Noted  . Acute cholecystitis 05/01/2017  . Tachycardia 05/01/2017  . Nausea & vomiting 05/01/2017  . Epigastric pain   . Carcinoid tumor of cecum 01/26/2017  . 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 ([place]) 02/18/2014  . Chronic venous insufficiency 08/02/2012  . HLD (  hyperlipidemia) 05/15/2012  . Cellulitis of left anterior lower leg 05/05/2012  . Diabetes mellitus ([place]) 05/05/2012  . Hypothyroidism 05/05/2012  . Asthma 05/05/2012    Assessment & Plan: Cholecystitis in a diabetic.  Will plan lap chole tomorrow unless CT demonstrates other issues.      Matt B. Hassell Done, MD, [facility] [phone]  beeper [phone]  05/01/2017 7:38 PM

## 2017-05-02 ENCOUNTER — Inpatient Hospital Stay (HOSPITAL_COMMUNITY): Payer: Medicare HMO | Admitting: Registered Nurse

## 2017-05-02 ENCOUNTER — Inpatient Hospital Stay (HOSPITAL_COMMUNITY): Payer: Medicare HMO

## 2017-05-02 ENCOUNTER — Encounter (HOSPITAL_COMMUNITY)
Admission: EM | Disposition: A | Discharge status: Home / Self Care | Payer: Self-pay | Source: Home / Self Care | Attending: Internal Medicine

## 2017-05-02 ENCOUNTER — Encounter (HOSPITAL_COMMUNITY): Payer: Self-pay | Admitting: General Surgery

## 2017-05-02 DIAGNOSIS — K810 Acute cholecystitis: Secondary | ICD-10-CM

## 2017-05-02 HISTORY — PX: CHOLECYSTECTOMY: SHX55

## 2017-05-02 LAB — SURGICAL PCR SCREEN
MRSA, PCR: NEGATIVE
STAPHYLOCOCCUS AUREUS: NEGATIVE

## 2017-05-02 LAB — TROPONIN I: Troponin I: <0.03 ng/mL (ref <0.03)

## 2017-05-02 LAB — CBC
HEMATOCRIT: 35.1 % — AB (ref 36.0–46.0)
HEMOGLOBIN: 11.5 g/dL — AB (ref 12.0–15.0)
MCH: 29.3 pg (ref 26.0–34.0)
MCHC: 32.8 g/dL (ref 30.0–36.0)
MCV: 89.5 fL (ref 78.0–100.0)
Platelets: 278 10*3/uL (ref 150–400)
RBC: 3.92 MIL/uL (ref 3.87–5.11)
RDW: 13 % (ref 11.5–15.5)
WBC: 15.3 10*3/uL — AB (ref 4.0–10.5)

## 2017-05-02 LAB — COMPREHENSIVE METABOLIC PANEL
ALBUMIN: 3 g/dL — AB (ref 3.5–5.0)
ALT: 21 U/L (ref 14–54)
ANION GAP: 7 (ref 5–15)
AST: 17 U/L (ref 15–41)
Alkaline Phosphatase: 115 U/L (ref 38–126)
BILIRUBIN TOTAL: 0.5 mg/dL (ref 0.3–1.2)
BUN: 9 mg/dL (ref 6–20)
CHLORIDE: 104 mmol/L (ref 101–111)
CO2: 25 mmol/L (ref 22–32)
Calcium: 8.4 mg/dL — ABNORMAL LOW (ref 8.9–10.3)
Creatinine, Ser: 0.72 mg/dL (ref 0.44–1.00)
GFR calc Af Amer: >60 mL/min (ref >60)
Glucose, Bld: 138 mg/dL — ABNORMAL HIGH (ref 65–99)
POTASSIUM: 3.3 mmol/L — AB (ref 3.5–5.1)
Sodium: 136 mmol/L (ref 135–145)
TOTAL PROTEIN: 6.7 g/dL (ref 6.5–8.1)

## 2017-05-02 LAB — GLUCOSE, CAPILLARY
GLUCOSE-CAPILLARY: 111 mg/dL — AB (ref 65–99)
Glucose-Capillary: 137 mg/dL — ABNORMAL HIGH (ref 65–99)
Glucose-Capillary: 137 mg/dL — ABNORMAL HIGH (ref 65–99)
Glucose-Capillary: 141 mg/dL — ABNORMAL HIGH (ref 65–99)
Glucose-Capillary: 153 mg/dL — ABNORMAL HIGH (ref 65–99)
Glucose-Capillary: 180 mg/dL — ABNORMAL HIGH (ref 65–99)
Glucose-Capillary: 234 mg/dL — ABNORMAL HIGH (ref 65–99)

## 2017-05-02 IMAGING — RF DG CHOLANGIOGRAM OPERATIVE
1 series · 4 of 4 positions shown · non-contrast
Comparison: CT [DATE]

CLINICAL DATA: Cholelithiasis

EXAM:
INTRAOPERATIVE CHOLANGIOGRAM
TECHNIQUE: Cholangiographic images from the C-arm fluoroscopic device were
submitted for interpretation post-operatively. Please see the
procedural report for the amount of contrast and the fluoroscopy
time utilized.

[Series 1: run · 4 of 38 frames shown]
[frame 6/38]
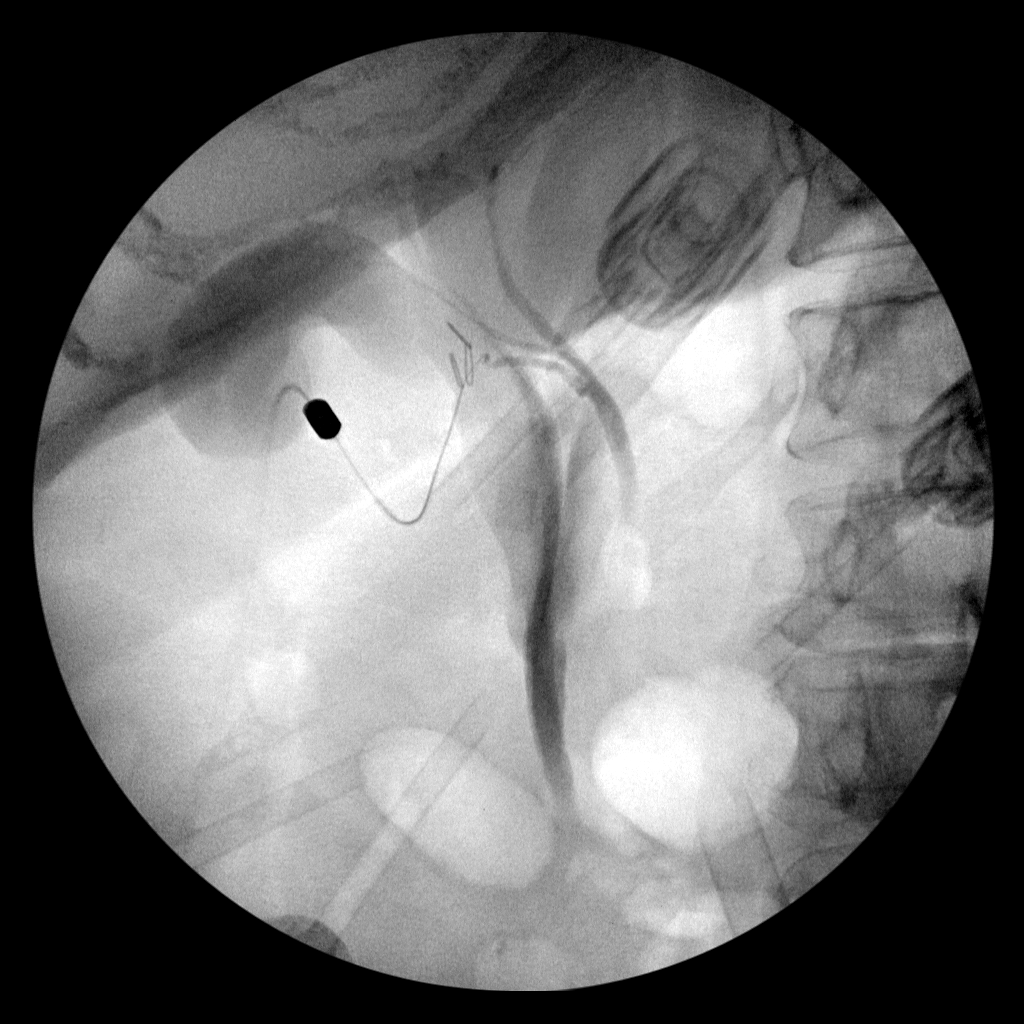
[frame 8/38]
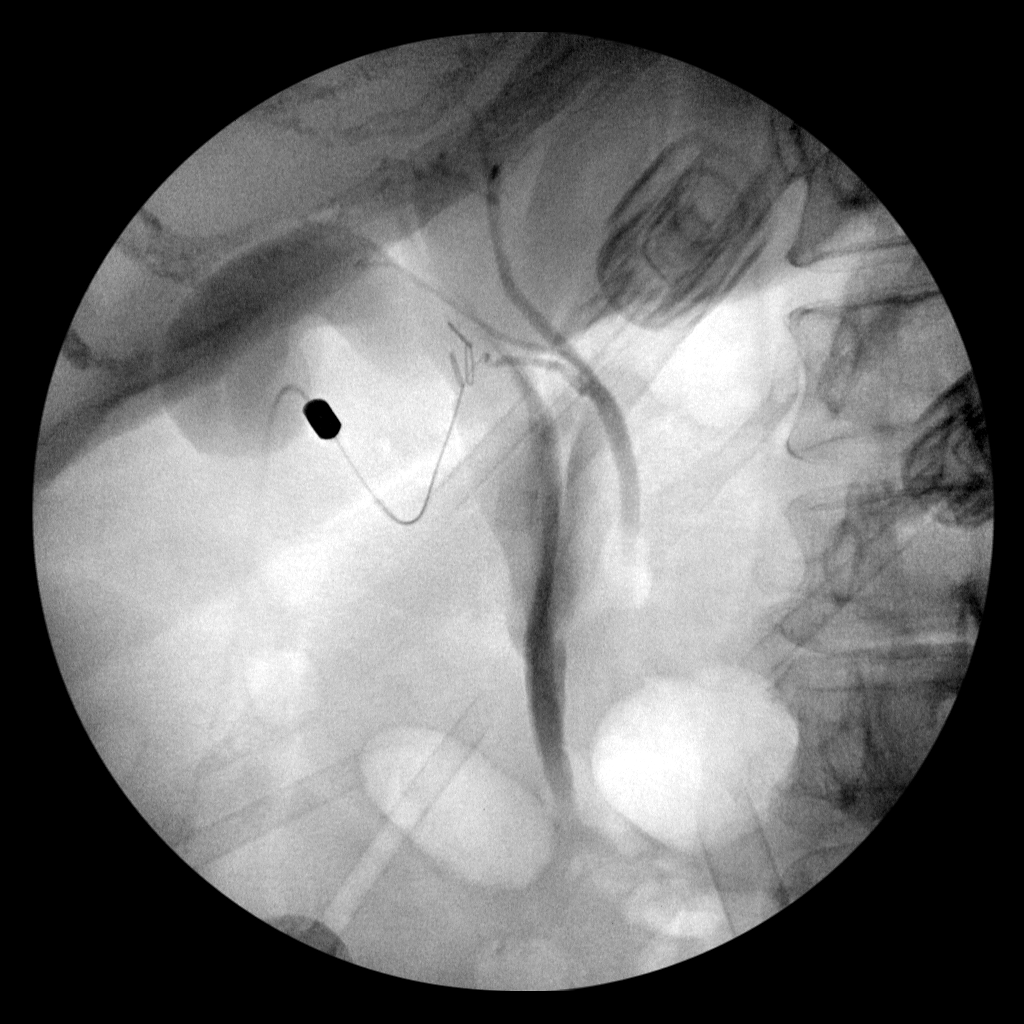
[frame 20/38]
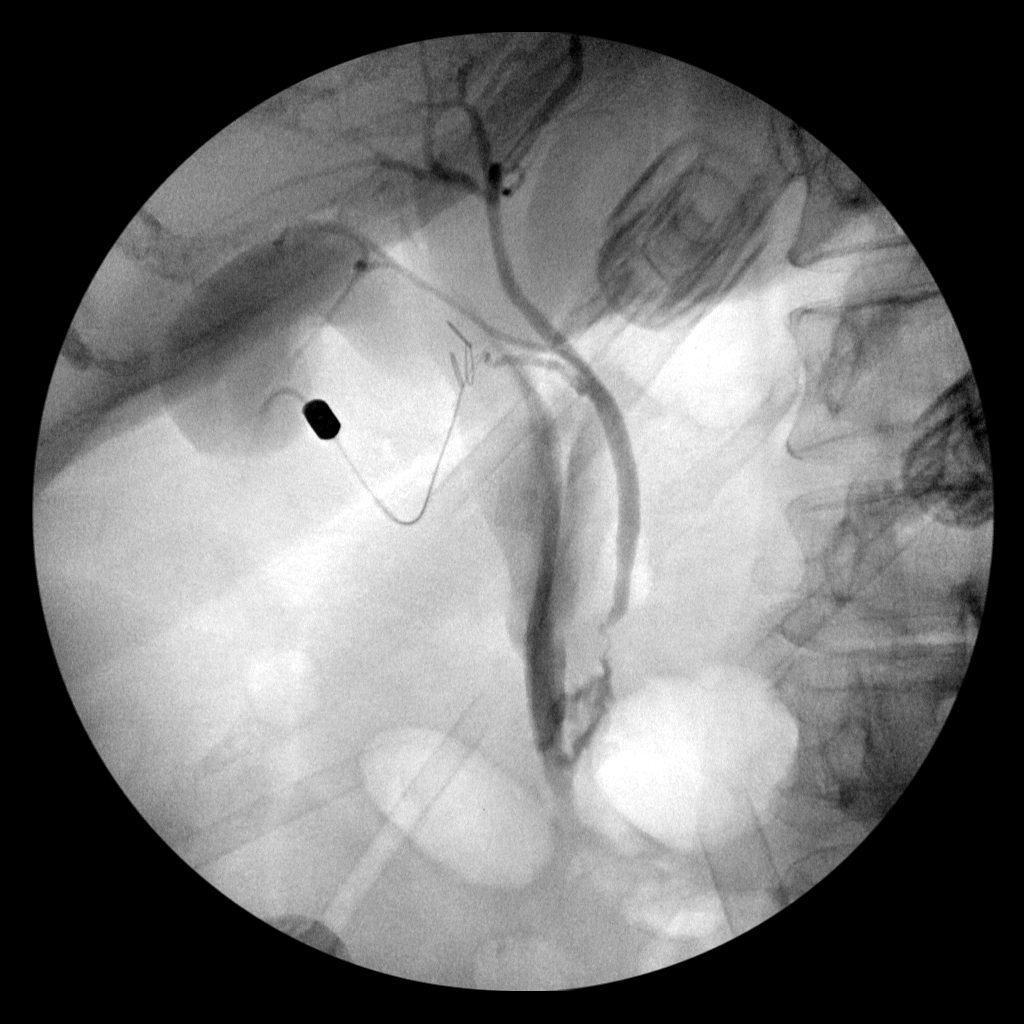
[frame 33/38]
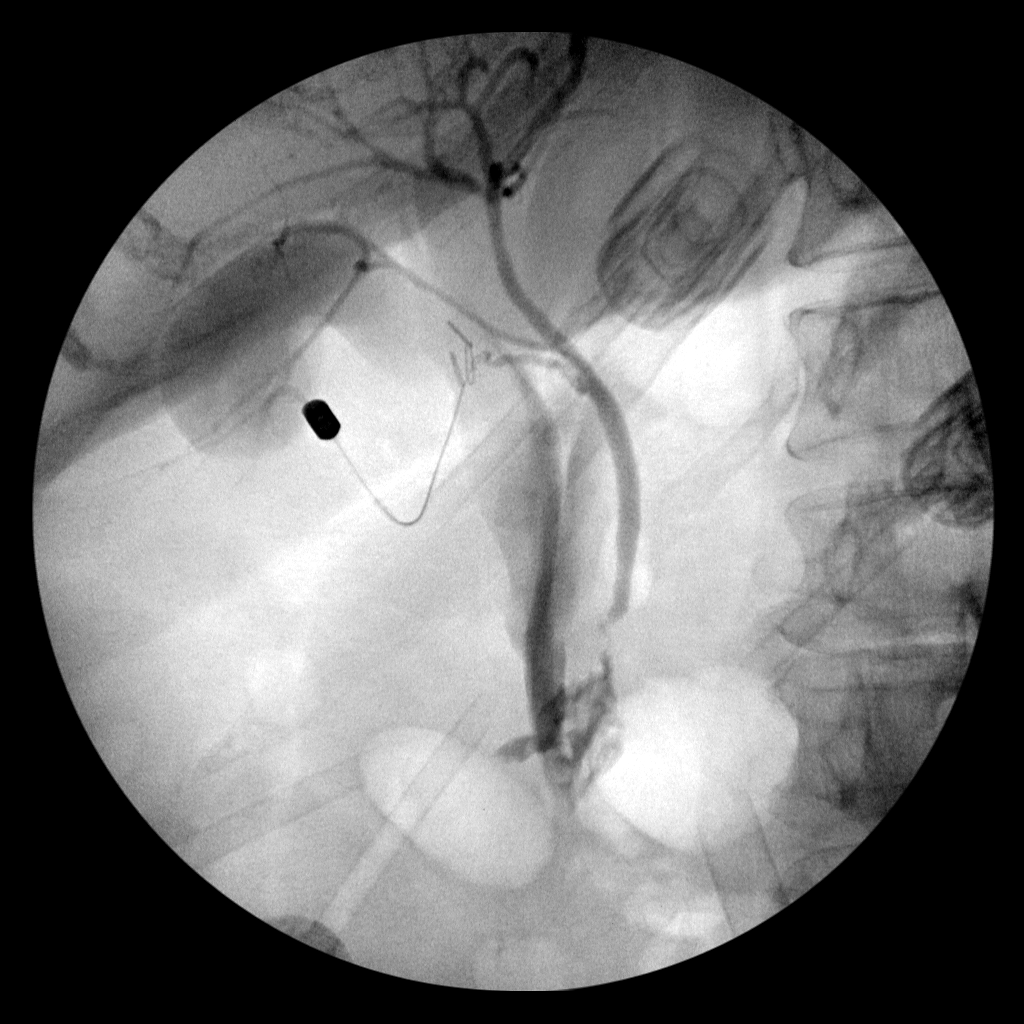

[4 of 4 positions shown; findings below may reference images not displayed]

FINDINGS: No persistent filling defects in the common duct. Intrahepatic ducts
are incompletely visualized, appearing decompressed centrally.
Contrast passes into the duodenum.

:
Negative for retained common duct stone.

## 2017-05-02 SURGERY — LAPAROSCOPIC CHOLECYSTECTOMY WITH INTRAOPERATIVE CHOLANGIOGRAM
Anesthesia: General

## 2017-05-02 MED ORDER — POTASSIUM CHLORIDE 10 MEQ/100ML IV SOLN
10.0000 meq | INTRAVENOUS | Status: AC
  Administered 2017-05-02 (×3): 10 meq via INTRAVENOUS
  Filled 2017-05-02 (×3): qty 100

## 2017-05-02 MED ORDER — LACTATED RINGERS IV SOLN
INTRAVENOUS | Status: DC | PRN
  Administered 2017-05-02: 11:00:00 via INTRAVENOUS

## 2017-05-02 MED ORDER — DEXAMETHASONE SODIUM PHOSPHATE 10 MG/ML IJ SOLN
INTRAMUSCULAR | Status: AC
  Filled 2017-05-02: qty 1

## 2017-05-02 MED ORDER — BUPIVACAINE-EPINEPHRINE 0.5% -1:200000 IJ SOLN
INTRAMUSCULAR | Status: DC | PRN
  Administered 2017-05-02: 30 mL

## 2017-05-02 MED ORDER — SUGAMMADEX SODIUM 200 MG/2ML IV SOLN
INTRAVENOUS | Status: AC
  Filled 2017-05-02: qty 2

## 2017-05-02 MED ORDER — PROPOFOL 10 MG/ML IV BOLUS
INTRAVENOUS | Status: AC
Start: 2017-05-02 — End: 2017-05-02
  Filled 2017-05-02: qty 20

## 2017-05-02 MED ORDER — HYDROCODONE-ACETAMINOPHEN 5-325 MG PO TABS: 1.0000 | ORAL_TABLET | ORAL | Status: DC | PRN

## 2017-05-02 MED ORDER — GUAIFENESIN ER 600 MG PO TB12
600.0000 mg | ORAL_TABLET | Freq: Two times a day (BID) | ORAL | Status: DC
  Administered 2017-05-02 – 2017-05-03 (×2): 600 mg via ORAL
  Filled 2017-05-02 (×2): qty 1

## 2017-05-02 MED ORDER — LIDOCAINE 2% (20 MG/ML) 5 ML SYRINGE
INTRAMUSCULAR | Status: AC
  Filled 2017-05-02: qty 5

## 2017-05-02 MED ORDER — IOPAMIDOL (ISOVUE-300) INJECTION 61%
INTRAVENOUS | Status: DC | PRN
  Administered 2017-05-02: 3 mL

## 2017-05-02 MED ORDER — FENTANYL CITRATE (PF) 250 MCG/5ML IJ SOLN
INTRAMUSCULAR | Status: AC
  Filled 2017-05-02: qty 5

## 2017-05-02 MED ORDER — SUGAMMADEX SODIUM 200 MG/2ML IV SOLN
INTRAVENOUS | Status: DC | PRN
  Administered 2017-05-02: 180 mg via INTRAVENOUS

## 2017-05-02 MED ORDER — PROPOFOL 500 MG/50ML IV EMUL
INTRAVENOUS | Status: DC | PRN
  Administered 2017-05-02: 25 ug/kg/min via INTRAVENOUS

## 2017-05-02 MED ORDER — LACTATED RINGERS IV SOLN: INTRAVENOUS | Status: DC

## 2017-05-02 MED ORDER — FENTANYL CITRATE (PF) 100 MCG/2ML IJ SOLN
INTRAMUSCULAR | Status: DC | PRN
  Administered 2017-05-02 (×4): 50 ug via INTRAVENOUS

## 2017-05-02 MED ORDER — MIDAZOLAM HCL 2 MG/2ML IJ SOLN
INTRAMUSCULAR | Status: AC
  Filled 2017-05-02: qty 2

## 2017-05-02 MED ORDER — PROPOFOL 10 MG/ML IV BOLUS
INTRAVENOUS | Status: DC | PRN
  Administered 2017-05-02: 150 mg via INTRAVENOUS

## 2017-05-02 MED ORDER — DEXAMETHASONE SODIUM PHOSPHATE 10 MG/ML IJ SOLN
INTRAMUSCULAR | Status: DC | PRN
  Administered 2017-05-02: 10 mg via INTRAVENOUS

## 2017-05-02 MED ORDER — MIDAZOLAM HCL 5 MG/5ML IJ SOLN
INTRAMUSCULAR | Status: DC | PRN
  Administered 2017-05-02: 1 mg via INTRAVENOUS

## 2017-05-02 MED ORDER — LACTATED RINGERS IR SOLN
Status: DC | PRN
  Administered 2017-05-02: 1000 mL

## 2017-05-02 MED ORDER — LIDOCAINE 2% (20 MG/ML) 5 ML SYRINGE
INTRAMUSCULAR | Status: DC | PRN
  Administered 2017-05-02: 80 mg via INTRAVENOUS

## 2017-05-02 MED ORDER — IOPAMIDOL (ISOVUE-300) INJECTION 61%
INTRAVENOUS | Status: AC
  Filled 2017-05-02: qty 50

## 2017-05-02 MED ORDER — EPHEDRINE 5 MG/ML INJ
INTRAVENOUS | Status: AC
  Filled 2017-05-02: qty 10

## 2017-05-02 MED ORDER — BUPIVACAINE-EPINEPHRINE (PF) 0.5% -1:200000 IJ SOLN
INTRAMUSCULAR | Status: AC
  Filled 2017-05-02: qty 30

## 2017-05-02 MED ORDER — ROCURONIUM BROMIDE 10 MG/ML (PF) SYRINGE
PREFILLED_SYRINGE | INTRAVENOUS | Status: DC | PRN
  Administered 2017-05-02: 50 mg via INTRAVENOUS

## 2017-05-02 MED ORDER — PHENYLEPHRINE 40 MCG/ML (10ML) SYRINGE FOR IV PUSH (FOR BLOOD PRESSURE SUPPORT)
PREFILLED_SYRINGE | INTRAVENOUS | Status: AC
  Filled 2017-05-02: qty 10

## 2017-05-02 MED ORDER — ONDANSETRON HCL 4 MG/2ML IJ SOLN
INTRAMUSCULAR | Status: DC | PRN
  Administered 2017-05-02: 4 mg via INTRAVENOUS

## 2017-05-02 MED ORDER — EPHEDRINE SULFATE-NACL 50-0.9 MG/10ML-% IV SOSY
PREFILLED_SYRINGE | INTRAVENOUS | Status: DC | PRN
  Administered 2017-05-02 (×2): 5 mg via INTRAVENOUS

## 2017-05-02 MED ORDER — ONDANSETRON HCL 4 MG/2ML IJ SOLN
INTRAMUSCULAR | Status: AC
  Filled 2017-05-02: qty 2

## 2017-05-02 MED ORDER — ROCURONIUM BROMIDE 10 MG/ML (PF) SYRINGE
PREFILLED_SYRINGE | INTRAVENOUS | Status: AC
  Filled 2017-05-02: qty 5

## 2017-05-02 MED ORDER — PHENYLEPHRINE 40 MCG/ML (10ML) SYRINGE FOR IV PUSH (FOR BLOOD PRESSURE SUPPORT)
PREFILLED_SYRINGE | INTRAVENOUS | Status: DC | PRN
  Administered 2017-05-02: 40 ug via INTRAVENOUS
  Administered 2017-05-02 (×3): 80 ug via INTRAVENOUS

## 2017-05-02 MED ORDER — PROPOFOL 10 MG/ML IV BOLUS
INTRAVENOUS | Status: AC
  Filled 2017-05-02: qty 20

## 2017-05-02 SURGICAL SUPPLY — 29 items
APPLIER CLIP 5 13 M/L LIGAMAX5 (MISCELLANEOUS) ×3
CABLE HIGH FREQUENCY MONO STRZ (ELECTRODE) ×3 IMPLANT
CATH REDDICK CHOLANGI 4FR 50CM (CATHETERS) ×3 IMPLANT
CHLORAPREP W/TINT 26ML (MISCELLANEOUS) ×3 IMPLANT
CLIP APPLIE 5 13 M/L LIGAMAX5 (MISCELLANEOUS) ×1 IMPLANT
COVER MAYO STAND STRL (DRAPES) ×3 IMPLANT
DECANTER SPIKE VIAL GLASS SM (MISCELLANEOUS) IMPLANT
DERMABOND ADVANCED (GAUZE/BANDAGES/DRESSINGS) ×2
DERMABOND ADVANCED .7 DNX12 (GAUZE/BANDAGES/DRESSINGS) ×1 IMPLANT
DRAPE C-ARM 42X120 X-RAY (DRAPES) ×3 IMPLANT
ELECT REM PT RETURN 15FT ADLT (MISCELLANEOUS) ×3 IMPLANT
GLOVE BIO SURGEON STRL SZ7.5 (GLOVE) ×30 IMPLANT
GOWN STRL REUS W/TWL XL LVL3 (GOWN DISPOSABLE) ×12 IMPLANT
HEMOSTAT SURGICEL 4X8 (HEMOSTASIS) IMPLANT
IV CATH 14GX2 1/4 (CATHETERS) ×3 IMPLANT
KIT BASIN OR (CUSTOM PROCEDURE TRAY) ×3 IMPLANT
POUCH SPECIMEN RETRIEVAL 10MM (ENDOMECHANICALS) ×3 IMPLANT
SCISSORS LAP 5X35 DISP (ENDOMECHANICALS) ×3 IMPLANT
SET IRRIG TUBING LAPAROSCOPIC (IRRIGATION / IRRIGATOR) ×3 IMPLANT
SLEEVE XCEL OPT CAN 5 100 (ENDOMECHANICALS) ×9 IMPLANT
SUT MNCRL AB 4-0 PS2 18 (SUTURE) ×3 IMPLANT
SUT NOVA NAB DX-16 0-1 5-0 T12 (SUTURE) ×6 IMPLANT
TOWEL OR 17X26 10 PK STRL BLUE (TOWEL DISPOSABLE) ×3 IMPLANT
TOWEL OR NON WOVEN STRL DISP B (DISPOSABLE) ×3 IMPLANT
TRAY LAPAROSCOPIC (CUSTOM PROCEDURE TRAY) ×3 IMPLANT
TROCAR BLADELESS OPT 5 100 (ENDOMECHANICALS) ×3 IMPLANT
TROCAR XCEL 12X100 BLDLESS (ENDOMECHANICALS) ×3 IMPLANT
TROCAR XCEL BLUNT TIP 100MML (ENDOMECHANICALS) ×3 IMPLANT
TUBING INSUF HEATED (TUBING) ×3 IMPLANT

## 2017-05-02 NOTE — Anesthesia Procedure Notes (Addendum)
Procedure Name: Intubation Date/Time: 05/02/2017 11:11 AM Performed by: West Pugh, CRNA Pre-anesthesia Checklist: Patient identified, Emergency Drugs available, Suction available, Patient being monitored and Timeout performed Patient Re-evaluated:Patient Re-evaluated prior to induction Oxygen Delivery Method: Circle system utilized Preoxygenation: Pre-oxygenation with 100% oxygen Induction Type: IV induction Ventilation: Mask ventilation without difficulty and Oral airway inserted - appropriate to patient size Laryngoscope Size: Mac and 4 Grade View: Grade I Tube type: Oral Tube size: 7.5 mm Number of attempts: 1 Airway Equipment and Method: Stylet Placement Confirmation: ETT inserted through vocal cords under direct vision,  positive ETCO2,  CO2 detector and breath sounds checked- equal and bilateral Secured at: 22 cm Tube secured with: Tape Dental Injury: Teeth and Oropharynx as per pre-operative assessment

## 2017-05-02 NOTE — Anesthesia Preprocedure Evaluation (Addendum)
Anesthesia Evaluation  Patient identified by MRN, date of birth, ID band Patient awake    Reviewed: Allergy & Precautions, NPO status , Patient's Chart, lab work & pertinent test results  Airway Mallampati: II  TM Distance: <3 FB Neck ROM: Full    Dental  (+) Teeth Intact, Dental Advisory Given   Pulmonary asthma ,    Pulmonary exam normal breath sounds clear to auscultation       Cardiovascular negative cardio ROS Normal cardiovascular exam Rhythm:Regular Rate:Normal     Neuro/Psych negative neurological ROS  negative psych ROS   GI/Hepatic negative GI ROS, Cholecystitis with cholelithiasis Colon cancer   Endo/Other  diabetes, Type 2, Oral Hypoglycemic AgentsHypothyroidism Obesity   Renal/GU negative Renal ROS     Musculoskeletal negative musculoskeletal ROS (+)   Abdominal   Peds  Hematology  (+) Blood dyscrasia, anemia ,   Anesthesia Other Findings Day of surgery medications reviewed with the patient.  Reproductive/Obstetrics                            Anesthesia Physical Anesthesia Plan  ASA: II  Anesthesia Plan: General   Post-op Pain Management:    Induction: Intravenous  PONV Risk Score and Plan: 4 or greater and Dexamethasone, Ondansetron, Treatment may vary due to age or medical condition and Propofol infusion  Airway Management Planned: Oral ETT  Additional Equipment:   Intra-op Plan:   Post-operative Plan: Extubation in OR  Informed Consent: I have reviewed the patients History and Physical, chart, labs and discussed the procedure including the risks, benefits and alternatives for the proposed anesthesia with the patient or authorized representative who has indicated his/her understanding and acceptance.   Dental advisory given  Plan Discussed with: CRNA  Anesthesia Plan Comments: (Risks/benefits of general anesthesia discussed with patient including risk of  damage to teeth, lips, gum, and tongue, nausea/vomiting, allergic reactions to medications, and the possibility of heart attack, stroke and death.  All patient questions answered.  Patient wishes to proceed.)        Anesthesia Quick Evaluation

## 2017-05-02 NOTE — Transfer of Care (Signed)
Immediate Anesthesia Transfer of Care Note  Patient: Sarie Stall  Procedure(s) Performed: LAPAROSCOPIC CHOLECYSTECTOMY WITH INTRAOPERATIVE CHOLANGIOGRAM, VENTRAL HERNIA REPAIR (N/A )  Patient Location: PACU  Anesthesia Type:General  Level of Consciousness: awake, alert , oriented and patient cooperative  Airway & Oxygen Therapy: Patient Spontanous Breathing and Patient connected to face mask oxygen  Post-op Assessment: Report given to RN, Post -op Vital signs reviewed and stable and Patient moving all extremities  Post vital signs: Reviewed and stable  Last Vitals:  Vitals:   05/01/17 2025 05/02/17 0430  BP: 131/69 133/63  Pulse: (!) 101 91  Resp: 18 20  Temp: 36.8 C 37.1 C  SpO2: 98% 95%    Last Pain:  Vitals:   05/02/17 0824  TempSrc:   PainSc: Asleep      Patients Stated Pain Goal: 3 (33/35/45 6256)  Complications: No apparent anesthesia complications

## 2017-05-02 NOTE — Op Note (Signed)
05/01/2017 - 05/02/2017  12:51 PM  PATIENT:  Jessica Farrell  72 y.o. female  PRE-OPERATIVE DIAGNOSIS:  Cholecystitis with cholelithiasis, ventral hernia  POST-OPERATIVE DIAGNOSIS:  Cholecystitis with cholelithiasis, ventral hernia  PROCEDURE:  Procedure(s): LAPAROSCOPIC CHOLECYSTECTOMY WITH INTRAOPERATIVE CHOLANGIOGRAM, VENTRAL HERNIA REPAIR (N/A)  SURGEON:  Surgeon(s) and Role:    * Jovita Kussmaul, MD - Primary  PHYSICIAN ASSISTANT:   ASSISTANTS: none   ANESTHESIA:   local and general  EBL:  minimal   BLOOD ADMINISTERED:none  DRAINS: none   LOCAL MEDICATIONS USED:  MARCAINE     SPECIMEN:  Source of Specimen:  gallbladder  DISPOSITION OF SPECIMEN:  PATHOLOGY  COUNTS:  YES  TOURNIQUET:  * No tourniquets in log *  DICTATION: .Dragon Dictation   After informed consent was obtained the patient was brought to the operating room and placed in the supine position on the operating table.  After adequate induction of general anesthesia the patient's abdomen was prepped with ChloraPrep, allowed to dry, and draped in usual sterile manner.  An appropriate timeout was performed.  Given her history of previous colectomy with a ventral hernia I decided to access the abdominal cavity through a stab incision in the left upper quadrant.  This area was infiltrated with quarter percent Marcaine.  A 5 mm Optiview port and camera were used to bluntly dissected the layers of the abdominal wall until access was gained to the abdominal cavity.  The abdomen is then insufflated with carbon dioxide without difficulty.  The abdomen was inspected and there was a moderate sized ventral hernia just above the umbilicus at her previous incision site.  I was able to place 2 5 mm ports on the right upper quadrant under direct vision without difficulty.  I then used a harmonic scalpel to take down the omental adhesions to the hernia sac.  Next I opened the hernia sac sharply with a 15 blade knife.  I then placed  a 0 Vicryl pursestring stitch in the fascial edges around the opening.  The Hassan cannula was placed through this opening and anchored with the Vicryl stitches.  Wounds were then able to examine the right upper quadrant.  The patient had a large inflamed gallbladder.  I was able to bluntly dissected the omentum off of the body of the gallbladder.  We then aspirated the dome of the gallbladder so that we could grasp the gallbladder.  The gallbladder was then elevated anteriorly and superiorly with a blunt grasper through the lateralmost 5 mm port.  Another 5 mm grasper was used to retract on the body and neck of the gallbladder.  A dissector was placed to a second 5 mm port in the epigastric region and the peritoneal reflection of the gallbladder neck was opened.  Blunt dissection was carried out in this area until the gallbladder neck cystic duct junction was readily identified and a good window was created.  A clip was placed on the gallbladder neck.  A small ductotomy was made with the laparoscopic scissors just below the clip.  A 14-gauge Angiocath was placed percutaneously through the anterior abdominal wall under direct vision.  A Reddick cholangiogram catheter was placed through the Angiocath and flushed.  The Reddick catheter was placed in the cystic duct and anchored in place with a clip.  A cholangiogram was obtained that showed no filling defects and good emptying into the duodenum and adequate length on the cystic duct.  The ankle clip and catheters were removed from the patient.  3 clips placed proximally on the cystic duct and the duct was divided between the 2 sets of clips.  Posterior to this the cystic artery was identified and dissected bluntly in a circumferential manner until a good window was created.  2 clips were placed proximally 1 distally on the artery and the artery was divided between the 2.  Next the laparoscopic hook cautery device was used to separate the gallbladder from the liver  bed.  Once the gallbladder was detached from the liver bed the liver bed was inspected and several small bleeding points were coagulated with the electrocautery until the area was completely hemostatic.  The laparoscope was moved to the epigastric port.  A laparoscopic bag was placed through the [facility] cannula and the gallbladder was placed in the bag and the bag was closed.  The gallbladder and bag were then removed with the [facility] cannula through the supraumbilical port.  The Sheryle Hail was then replaced and the liver bed was inspected again.  It was found to be hemostatic.  The abdomen was irrigated with copious amounts of saline.  The abdomen was generally inspected and no other abnormalities were noted.  The fascial defect at the hernia site in the supraumbilical location was then closed with interrupted #1 Novafil stitches.  The repair was examined laparoscopically and seemed to be nicely closed.  The rest of the ports were then removed under direct vision and the gas was allowed to escape.  The incisions were all closed with interrupted 4-0 Monocryl subcuticular stitches.  Dermabond dressings were applied.  The patient tolerated the procedure well.  At the end of the case all needle sponge and instrument counts were correct.  The patient was then awakened and taken to recovery in stable condition.  PLAN OF CARE: Admit to inpatient   PATIENT DISPOSITION:  PACU - hemodynamically stable.   Delay start of Pharmacological VTE agent (>24hrs) due to surgical blood loss or risk of bleeding: no

## 2017-05-02 NOTE — Progress Notes (Addendum)
PROGRESS NOTE    Jessica Farrell  WER:[id number] DOB: 10/25/1945 DOA: 05/01/2017 PCP: Binnie Rail, MD   Brief Narrative: Patient is a  72 year old female with past medical history of hyperlipidemia, diabetes, asthma, history of colon cancer status post resection  who presented to the emergency department with complaints of nausea and vomiting.  Patient also complained of abdominal pain.  Right upper quadrant ultrasound done on presentation showed cholelithiasis and possible acute cholecystitis.,  without evidence of acute biliary obstruction. Surgery consulted.  Patient underwent laparoscopic cholecystectomy with ventral hernia repair today.  Assessment & Plan:   Principal Problem:   Acute cholecystitis Active Problems:   Tachycardia   Nausea & vomiting   Acute cholecystitis: Underwent laparoscopic cholecystectomy.    Continue n.p.o. status.  Initiation of diet as per surgery. Continue Cipro and Flagyl. Continue IV fluids.  Tachycardia: Resolved.  Elevated d-dimer.  Currently she is saturating fine on room air.  She is not tachycardic.  Hyperlipidemia: Continue Lipitor  Diabetes type 2: Continue sliding scale insulin  Hypothyroidism: Continue levothyroxine.   Cellulitis of lower extremity:Resolved.  Hypokalemia: Supplemented with potassium.  Will check levels tomorrow.  Leukocytosis: We will check CBC tomorrow.  Continue antibiotic.    DVT prophylaxis: Lovenox Code Status: Full Family Communication: Family present at the bedside Disposition Plan: Home after clearance from surgery   Consultants: General surgery  Procedures: Laparoscopic cholecystectomy on 05/02/17  Antimicrobials: Ciprofloxacin and metronidazole since 05/02/17  Subjective: Patient seen and examined the bedside this morning.  Abdominal pain has improved.  No nausea or vomiting.  Waiting for surgery  Objective: Vitals:   05/02/17 1304 05/02/17 1315 05/02/17 1330 05/02/17 1345  BP: (!) 145/117  125/68 117/71 107/69  Pulse: (!) 101 98 96 98  Resp: 13 17 16 15   Temp: 99.1 F (37.3 C)  98.6 F (37 C) 98.1 F (36.7 C)  TempSrc:      SpO2: 100% 98% 95% 95%  Weight:      Height:        Intake/Output Summary (Last 24 hours) at 05/02/2017 1401 Last data filed at 05/02/2017 1330 Gross per 24 hour  Intake 3118.75 ml  Output 15 ml  Net 3103.75 ml   Filed Weights   05/01/17 0914 05/02/17 0430  Weight: 84.8 kg (187 lb) 84.8 kg (186 lb 16 oz)    Examination:  General exam: Appears calm and comfortable ,Not in distress,average built HEENT:PERRL,Oral mucosa moist, Ear/Nose normal on gross exam Respiratory system: Bilateral equal air entry, normal vesicular breath sounds, no wheezes or crackles  Cardiovascular system: S1 & S2 heard, RRR. No JVD, murmurs, rubs, gallops or clicks. No pedal edema. Gastrointestinal system: Abdomen is nondistended, soft .  Mild tenderness on the right upper quadrant. no organomegaly or masses felt. Normal bowel sounds heard. Central nervous system: Alert and oriented. No focal neurological deficits. Extremities: No edema, no clubbing ,no cyanosis, distal peripheral pulses palpable. Skin: No rashes, lesions or ulcers,no icterus ,no pallor MSK: Normal muscle bulk,tone ,power Psychiatry: Judgement and insight appear normal. Mood & affect appropriate.     Data Reviewed: I have personally reviewed following labs and imaging studies  CBC: Recent Labs  Lab 05/01/17 1308 05/01/17 1529 05/02/17 0214  WBC 16.5*  --  15.3*  HGB 13.1 11.6* 11.5*  HCT 39.2 34.0* 35.1*  MCV 89.3  --  89.5  PLT 302  --  195   Basic Metabolic Panel: Recent Labs  Lab 05/01/17 1523 05/01/17 1529 05/02/17 0214  NA 136  137 136  K 3.7 3.5 3.3*  CL 104 102 104  CO2 24  --  25  GLUCOSE 151* 151* 138*  BUN 8 6 9   CREATININE 0.63 0.50 0.72  CALCIUM 8.2*  --  8.4*   GFR: Estimated Creatinine Clearance: 69.3 mL/min (by C-G formula based on SCr of 0.72 mg/dL). Liver  Function Tests: Recent Labs  Lab 05/01/17 1523 05/02/17 0214  AST 19 17  ALT 18 21  ALKPHOS 113 115  BILITOT 0.4 0.5  PROT 6.8 6.7  ALBUMIN 3.0* 3.0*   Recent Labs  Lab 05/01/17 1523  LIPASE 24   No results for input(s): AMMONIA in the last 168 hours. Coagulation Profile: No results for input(s): INR, PROTIME in the last 168 hours. Cardiac Enzymes: Recent Labs  Lab 05/01/17 2043 05/02/17 0214 05/02/17 0959  TROPONINI <0.03 <0.03 <0.03   BNP (last 3 results) No results for input(s): PROBNP in the last 8760 hours. HbA1C: No results for input(s): HGBA1C in the last 72 hours. CBG: Recent Labs  Lab 05/02/17 0035 05/02/17 0421 05/02/17 0724 05/02/17 1048 05/02/17 1313  GLUCAP 141* 137* 153* 111* 137*   Lipid Profile: No results for input(s): CHOL, HDL, LDLCALC, TRIG, CHOLHDL, LDLDIRECT in the last 72 hours. Thyroid Function Tests: No results for input(s): TSH, T4TOTAL, FREET4, T3FREE, THYROIDAB in the last 72 hours. Anemia Panel: No results for input(s): VITAMINB12, FOLATE, FERRITIN, TIBC, IRON, RETICCTPCT in the last 72 hours. Sepsis Labs: No results for input(s): PROCALCITON, LATICACIDVEN in the last 168 hours.  Recent Results (from the past 240 hour(s))  Surgical pcr screen     Status: None   Collection Time: 05/02/17 10:18 AM  Result Value Ref Range Status   MRSA, PCR NEGATIVE NEGATIVE Final   Staphylococcus aureus NEGATIVE NEGATIVE Final    Comment: (NOTE) The Xpert SA Assay (FDA approved for NASAL specimens in patients 60 years of age and older), is one component of a comprehensive surveillance program. It is not intended to diagnose infection nor to guide or monitor treatment. Performed at [facility], [place] [address], [place], [place] [address]          Radiology Studies: Dg Cholangiogram Operative  Result Date: 05/02/2017 CLINICAL DATA:  Cholelithiasis EXAM: INTRAOPERATIVE CHOLANGIOGRAM TECHNIQUE: Cholangiographic images  from the C-arm fluoroscopic device were submitted for interpretation post-operatively. Please see the procedural report for the amount of contrast and the fluoroscopy time utilized. COMPARISON:  CT 05/01/2017 FINDINGS: No persistent filling defects in the common duct. Intrahepatic ducts are incompletely visualized, appearing decompressed centrally. Contrast passes into the duodenum. : Negative for retained common duct stone. Electronically Signed   By: Lucrezia Europe M.D.   On: 05/02/2017 12:33   Ct Abdomen Pelvis W Contrast  Result Date: 05/01/2017 CLINICAL DATA:  72 year old female with nausea vomiting. EXAM: CT ABDOMEN AND PELVIS WITH CONTRAST TECHNIQUE: Multidetector CT imaging of the abdomen and pelvis was performed using the standard protocol following bolus administration of intravenous contrast. CONTRAST:  162mL ISOVUE-300 IOPAMIDOL (ISOVUE-300) INJECTION 61%, <See Chart> ISOVUE-300 IOPAMIDOL (ISOVUE-300) INJECTION 61% COMPARISON:  Right upper quadrant ultrasound dated 05/01/2017 FINDINGS: Lower chest: There bibasilar linear atelectasis/scarring. No intra-abdominal free air or free fluid. Hepatobiliary: The liver is unremarkable. No intrahepatic biliary ductal dilatation. The gallbladder is distended. There is thickened and inflamed appearance of the gallbladder wall with pericholecystic stranding. At least two stones noted in the neck of the gallbladder and region of the proximal cystic duct. Multiple calcific densities in the head of  the pancreas adjacent to the central CBD may be related to prior pancreatitis and less likely represent stones within the central CBD. Pancreas: Coarse calcific foci of the head of the pancreas likely sequela of prior pancreatitis. No active inflammatory changes. No dilatation of the main pancreatic duct or gland atrophy. Spleen: Normal in size without focal abnormality. Adrenals/Urinary Tract: Adrenal glands are unremarkable. Kidneys are normal, without renal calculi, focal  lesion, or hydronephrosis. Bladder is unremarkable. Stomach/Bowel: There is loose stool throughout the colon compatible with diarrheal state. Clinical correlation is recommended. There postsurgical changes of right hemicolectomy with anastomotic suture in the right upper quadrant. There is no bowel obstruction or active inflammation. The appendix is surgically absent. Multiple small sigmoid diverticula noted without active inflammatory changes. Vascular/Lymphatic: The abdominal aorta and IVC appear unremarkable. No portal venous gas. There is no adenopathy. There is a retroaortic left renal vein anatomy. Reproductive: The uterus is anteverted and grossly unremarkable. The ovaries are unremarkable as well. No pelvic mass. Other: There are 2 ventral/supraumbilical fat containing hernias with the largest defect measure approximately 5 cm in transverse axial diameter. No fluid collection or evidence of inflammatory changes. Musculoskeletal: There is degenerative changes of the spine. No acute osseous pathology. IMPRESSION: 1. Cholelithiasis with gallstones in the neck of the gallbladder and proximal cystic duct and findings most consistent with acute cholecystitis. Clinical correlation is recommended. 2. Diarrheal state.  No bowel obstruction or active inflammation. Electronically Signed   By: Anner Crete M.D.   On: 05/01/2017 20:11   US Abdomen Limited Ruq  Result Date: 05/01/2017 CLINICAL DATA:  72 year old female with mid abdominal pain for 2 days. EXAM: ULTRASOUND ABDOMEN LIMITED RIGHT UPPER QUADRANT COMPARISON:  CT Abdomen and Pelvis 05/31/2015. FINDINGS: Gallbladder: There is a 6 millimeter gallstone identified in the gallbladder fundus. However, the gallbladder appears distended with wall thickening up to 7 millimeters (image 17). Despite this, no sonographic Murphy sign was elicited. No definite pericholecystic fluid. Common bile duct: Diameter: 5 millimeters, normal. Liver: Mildly increased liver  echogenicity (image 59). No discrete liver lesion. No intrahepatic biliary ductal dilatation. Portal vein is patent on color Doppler imaging with normal direction of blood flow towards the liver. Other findings: Negative visible right kidney. IMPRESSION: 1. Positive for small gallstone(s), and a distended appearing gallbladder with wall thickening. The constellation is suspicious for Acute Cholecystitis, although no sonographic Murphy sign was elicited. 2. No evidence of acute biliary obstruction that no evidence of biliary ductal obstruction. 3. Mild hepatic steatosis. Electronically Signed   By: Genevie Ann M.D.   On: 05/01/2017 16:24        Scheduled Meds: . enoxaparin (LOVENOX) injection  40 mg Subcutaneous Q24H  . insulin aspart  0-9 Units Subcutaneous Q4H  . levothyroxine  137 mcg Oral QAC breakfast  . montelukast  10 mg Oral QHS   Continuous Infusions: . sodium chloride 75 mL/hr at 05/01/17 2059  . ciprofloxacin Stopped (05/02/17 0606)  . metronidazole Stopped (05/02/17 1027)  . potassium chloride       LOS: 1 day    Time spent: More than 50% of that time was spent in counseling and/or coordination of care.      Marene Lenz, MD [facility] Pager [phone]  If 7PM-7AM, please contact night-coverage [email or web address] Password [email or web address] 05/02/2017, 2:01 PM

## 2017-05-02 NOTE — Progress Notes (Signed)
Subjective/Chief Complaint: Complains of RUQ pain   Objective: Vital signs in last 24 hours: Temp:  [98.2 F (36.8 C)-98.8 F (37.1 C)] 98.8 F (37.1 C) (03/13 0430) Pulse Rate:  [91-105] 91 (03/13 0430) Resp:  [18-20] 20 (03/13 0430) BP: (120-134)/(63-85) 133/63 (03/13 0430) SpO2:  [95 %-99 %] 95 % (03/13 0430) Weight:  [84.8 kg (186 lb 16 oz)] 84.8 kg (186 lb 16 oz) (03/13 0430) Last BM Date: 04/30/17  Intake/Output from previous day: 03/12 0701 - 03/13 0700 In: 1318.8 [I.V.:718.8; IV Piggyback:600] Out: -  Intake/Output this shift: No intake/output data recorded.  General appearance: alert and cooperative Resp: clear to auscultation bilaterally Cardio: regular rate and rhythm GI: soft, tender RUQ  Lab Results:  Recent Labs    05/01/17 1308 05/01/17 1529 05/02/17 0214  WBC 16.5*  --  15.3*  HGB 13.1 11.6* 11.5*  HCT 39.2 34.0* 35.1*  PLT 302  --  278   BMET Recent Labs    05/01/17 1523 05/01/17 1529 05/02/17 0214  NA 136 137 136  K 3.7 3.5 3.3*  CL 104 102 104  CO2 24  --  25  GLUCOSE 151* 151* 138*  BUN 8 6 9   CREATININE 0.63 0.50 0.72  CALCIUM 8.2*  --  8.4*   PT/INR No results for input(s): LABPROT, INR in the last 72 hours. ABG No results for input(s): PHART, HCO3 in the last 72 hours.  Invalid input(s): PCO2, PO2  Studies/Results: Ct Abdomen Pelvis W Contrast  Result Date: 05/01/2017 CLINICAL DATA:  72 year old female with nausea vomiting. EXAM: CT ABDOMEN AND PELVIS WITH CONTRAST TECHNIQUE: Multidetector CT imaging of the abdomen and pelvis was performed using the standard protocol following bolus administration of intravenous contrast. CONTRAST:  161mL ISOVUE-300 IOPAMIDOL (ISOVUE-300) INJECTION 61%, <See Chart> ISOVUE-300 IOPAMIDOL (ISOVUE-300) INJECTION 61% COMPARISON:  Right upper quadrant ultrasound dated 05/01/2017 FINDINGS: Lower chest: There bibasilar linear atelectasis/scarring. No intra-abdominal free air or free fluid.  Hepatobiliary: The liver is unremarkable. No intrahepatic biliary ductal dilatation. The gallbladder is distended. There is thickened and inflamed appearance of the gallbladder wall with pericholecystic stranding. At least two stones noted in the neck of the gallbladder and region of the proximal cystic duct. Multiple calcific densities in the head of the pancreas adjacent to the central CBD may be related to prior pancreatitis and less likely represent stones within the central CBD. Pancreas: Coarse calcific foci of the head of the pancreas likely sequela of prior pancreatitis. No active inflammatory changes. No dilatation of the main pancreatic duct or gland atrophy. Spleen: Normal in size without focal abnormality. Adrenals/Urinary Tract: Adrenal glands are unremarkable. Kidneys are normal, without renal calculi, focal lesion, or hydronephrosis. Bladder is unremarkable. Stomach/Bowel: There is loose stool throughout the colon compatible with diarrheal state. Clinical correlation is recommended. There postsurgical changes of right hemicolectomy with anastomotic suture in the right upper quadrant. There is no bowel obstruction or active inflammation. The appendix is surgically absent. Multiple small sigmoid diverticula noted without active inflammatory changes. Vascular/Lymphatic: The abdominal aorta and IVC appear unremarkable. No portal venous gas. There is no adenopathy. There is a retroaortic left renal vein anatomy. Reproductive: The uterus is anteverted and grossly unremarkable. The ovaries are unremarkable as well. No pelvic mass. Other: There are 2 ventral/supraumbilical fat containing hernias with the largest defect measure approximately 5 cm in transverse axial diameter. No fluid collection or evidence of inflammatory changes. Musculoskeletal: There is degenerative changes of the spine. No acute osseous pathology. IMPRESSION: 1.  Cholelithiasis with gallstones in the neck of the gallbladder and proximal  cystic duct and findings most consistent with acute cholecystitis. Clinical correlation is recommended. 2. Diarrheal state.  No bowel obstruction or active inflammation. Electronically Signed   By: Anner Crete M.D.   On: 05/01/2017 20:11   US Abdomen Limited Ruq  Result Date: 05/01/2017 CLINICAL DATA:  71 year old female with mid abdominal pain for 2 days. EXAM: ULTRASOUND ABDOMEN LIMITED RIGHT UPPER QUADRANT COMPARISON:  CT Abdomen and Pelvis 05/31/2015. FINDINGS: Gallbladder: There is a 6 millimeter gallstone identified in the gallbladder fundus. However, the gallbladder appears distended with wall thickening up to 7 millimeters (image 17). Despite this, no sonographic Murphy sign was elicited. No definite pericholecystic fluid. Common bile duct: Diameter: 5 millimeters, normal. Liver: Mildly increased liver echogenicity (image 59). No discrete liver lesion. No intrahepatic biliary ductal dilatation. Portal vein is patent on color Doppler imaging with normal direction of blood flow towards the liver. Other findings: Negative visible right kidney. IMPRESSION: 1. Positive for small gallstone(s), and a distended appearing gallbladder with wall thickening. The constellation is suspicious for Acute Cholecystitis, although no sonographic Murphy sign was elicited. 2. No evidence of acute biliary obstruction that no evidence of biliary ductal obstruction. 3. Mild hepatic steatosis. Electronically Signed   By: Genevie Ann M.D.   On: 05/01/2017 16:24    Anti-infectives: Anti-infectives (From admission, onward)   Start     Dose/Rate Route Frequency Ordered Stop   05/02/17 0600  ciprofloxacin (CIPRO) IVPB 400 mg     400 mg 200 mL/hr over 60 Minutes Intravenous Every 12 hours 05/01/17 1839     05/02/17 0100  metroNIDAZOLE (FLAGYL) IVPB 500 mg     500 mg 100 mL/hr over 60 Minutes Intravenous Every 8 hours 05/01/17 2027     05/01/17 1700  ciprofloxacin (CIPRO) IVPB 400 mg     400 mg 200 mL/hr over 60 Minutes  Intravenous  Once 05/01/17 1647 05/01/17 1927   05/01/17 1700  metroNIDAZOLE (FLAGYL) IVPB 500 mg     500 mg 100 mL/hr over 60 Minutes Intravenous  Once 05/01/17 1647 05/01/17 1816      Assessment/Plan: s/p * No surgery found * The patient appears to have cholecystitis with cholelithiasis. I think she would benefit from having gallbladder removed. I have discussed with her the risks and benefits of the surgery as well as some of the technical aspects including the risk of cbd injury and she understands and wishes to proceed  LOS: 1 day    TOTH III,Ezri Fanguy S 05/02/2017

## 2017-05-03 LAB — CBC WITH DIFFERENTIAL/PLATELET
BASOS PCT: 0 %
Basophils Absolute: 0 10*3/uL (ref 0.0–0.1)
Eosinophils Absolute: 0 10*3/uL (ref 0.0–0.7)
Eosinophils Relative: 0 %
HCT: 33 % — ABNORMAL LOW (ref 36.0–46.0)
Hemoglobin: 10.6 g/dL — ABNORMAL LOW (ref 12.0–15.0)
Lymphocytes Relative: 7 %
Lymphs Abs: 1.1 10*3/uL (ref 0.7–4.0)
MCH: 29.3 pg (ref 26.0–34.0)
MCHC: 32.1 g/dL (ref 30.0–36.0)
MCV: 91.2 fL (ref 78.0–100.0)
MONO ABS: 0.7 10*3/uL (ref 0.1–1.0)
MONOS PCT: 5 %
NEUTROS ABS: 12.8 10*3/uL — AB (ref 1.7–7.7)
Neutrophils Relative %: 88 %
Platelets: 251 10*3/uL (ref 150–400)
RBC: 3.62 MIL/uL — ABNORMAL LOW (ref 3.87–5.11)
RDW: 13 % (ref 11.5–15.5)
WBC: 14.6 10*3/uL — ABNORMAL HIGH (ref 4.0–10.5)

## 2017-05-03 LAB — BASIC METABOLIC PANEL
Anion gap: 8 (ref 5–15)
BUN: 11 mg/dL (ref 6–20)
CALCIUM: 8.2 mg/dL — AB (ref 8.9–10.3)
CO2: 24 mmol/L (ref 22–32)
CREATININE: 0.67 mg/dL (ref 0.44–1.00)
Chloride: 106 mmol/L (ref 101–111)
GFR calc non Af Amer: >60 mL/min (ref >60)
GLUCOSE: 169 mg/dL — AB (ref 65–99)
Potassium: 4 mmol/L (ref 3.5–5.1)
Sodium: 138 mmol/L (ref 135–145)

## 2017-05-03 LAB — GLUCOSE, CAPILLARY
GLUCOSE-CAPILLARY: 159 mg/dL — AB (ref 65–99)
Glucose-Capillary: 149 mg/dL — ABNORMAL HIGH (ref 65–99)
Glucose-Capillary: 203 mg/dL — ABNORMAL HIGH (ref 65–99)
Glucose-Capillary: 238 mg/dL — ABNORMAL HIGH (ref 65–99)

## 2017-05-03 MED ORDER — GUAIFENESIN-DM 100-10 MG/5ML PO SYRP
5.0000 mL | ORAL_SOLUTION | ORAL | Status: DC | PRN
  Administered 2017-05-03: 5 mL via ORAL
  Filled 2017-05-03 (×2): qty 10

## 2017-05-03 MED ORDER — METRONIDAZOLE 500 MG PO TABS: 500.0000 mg | ORAL_TABLET | Freq: Three times a day (TID) | ORAL | 0 refills | Status: DC

## 2017-05-03 MED ORDER — METRONIDAZOLE 500 MG PO TABS
500.0000 mg | ORAL_TABLET | Freq: Three times a day (TID) | ORAL | Status: DC
  Administered 2017-05-03: 500 mg via ORAL
  Filled 2017-05-03: qty 1

## 2017-05-03 MED ORDER — CIPROFLOXACIN HCL 500 MG PO TABS: 500.0000 mg | ORAL_TABLET | Freq: Two times a day (BID) | ORAL | 0 refills | Status: DC

## 2017-05-03 MED ORDER — CIPROFLOXACIN HCL 500 MG PO TABS: 500.0000 mg | ORAL_TABLET | Freq: Two times a day (BID) | ORAL | Status: DC

## 2017-05-03 NOTE — Care Management Note (Signed)
Case Management Note  Patient Details  Name: Jessica Farrell MRN: [id number] Date of Birth: [birth year]  Subjective/Objective: No CM needs.                   Action/Plan:d/c home.   Expected Discharge Date:  05/03/17               Expected Discharge Plan:  Home/Self Care  In-House Referral:     Discharge planning Services  CM Consult  Post Acute Care Choice:    Choice offered to:     DME Arranged:    DME Agency:     HH Arranged:    HH Agency:     Status of Service:  Completed, signed off  If discussed at [employer] of Stay Meetings, dates discussed:    Additional Comments:  Dessa Phi, RN 05/03/2017, 2:45 PM

## 2017-05-03 NOTE — Anesthesia Postprocedure Evaluation (Signed)
Anesthesia Post Note  Patient: Eliska Hamil  Procedure(s) Performed: LAPAROSCOPIC CHOLECYSTECTOMY WITH INTRAOPERATIVE CHOLANGIOGRAM, VENTRAL HERNIA REPAIR (N/A )     Patient location during evaluation: PACU Anesthesia Type: General Level of consciousness: awake and alert Pain management: pain level controlled Vital Signs Assessment: post-procedure vital signs reviewed and stable Respiratory status: spontaneous breathing, nonlabored ventilation and respiratory function stable Cardiovascular status: blood pressure returned to baseline and stable Postop Assessment: no apparent nausea or vomiting Anesthetic complications: no    Last Vitals:  Vitals:   05/02/17 2228 05/03/17 0414  BP: (!) 99/53 (!) 104/55  Pulse:  71  Resp:  18  Temp:  36.5 C  SpO2:  97%    Last Pain:  Vitals:   05/03/17 0414  TempSrc: Oral  PainSc:                  Catalina Gravel

## 2017-05-03 NOTE — Discharge Summary (Signed)
Physician Discharge Summary  Jessica Farrell VOZ:[id number] DOB: [birth year] DOA: 05/01/2017  PCP: Binnie Rail, MD  Admit date: 05/01/2017 Discharge date: 05/03/2017  Admitted From: Home Disposition:  Home  Discharge Condition: Stable CODE STATUS:Full Diet recommendation: Heart Healthy   Brief/Interim Summary:  Patient is a  72 year old female with past medical history of hyperlipidemia, diabetes, asthma, history of colon cancer status post resection  who presented to the emergency department with complaints of nausea and vomiting.  Patient also complained of abdominal pain.  Right upper quadrant ultrasound done on presentation showed cholelithiasis and possible acute cholecystitis,  without evidence of acute biliary obstruction. Surgery consulted.  Patient underwent laparoscopic cholecystectomy with ventral hernia repair today on 05/02/17. This morning she was started on clear liquid diet which has been advanced to soft diet.  He has tolerated diet.  She will be discharged today on oral antibiotics.  She was follow-up with surgery as an outpatient.    Following problems were addressed during her hospitalization:   Acute cholecystitis: Underwent laparoscopic cholecystectomy.   Diet advanced. Continue Cipro and Flagyl for 5 more days.  Tachycardia: Resolved.  Elevated d-dimer.  Currently she is saturating fine on room air.  She is not tachycardic.  Hyperlipidemia: Continue Lipitor  Diabetes type 2: Continue sliding scale insulin  Hypothyroidism: Continue levothyroxine.   Cellulitis of lower extremity:Resolved.  Hypokalemia: Supplemented with potassium.    Leukocytosis: Check CBC in a week.    Discharge Diagnoses:  Principal Problem:   Acute cholecystitis Active Problems:   Tachycardia   Nausea & vomiting    Discharge Instructions  Discharge Instructions    Diet - low sodium heart healthy   Complete by:  As directed    Soft diet for 2-3 days   Discharge  instructions   Complete by:  As directed    1) Follow up with surgery as instructed. 2) Take prescribed medications as instructed. 3)Follow up with PCP in a week.  Do a CBC test during the follow-up to check your white cell counts.   Increase activity slowly   Complete by:  As directed      Allergies as of 05/03/2017      Reactions   Penicillins Rash   CHILDHOOD ALLERGY Has patient had a PCN reaction causing immediate rash, facial/tongue/throat swelling, SOB or lightheadedness with hypotension: YeS Has patient had a PCN reaction causing severe rash involving mucus membranes or skin necrosis: Unknown Has patient had a PCN reaction that required hospitalization: Unknown Has patient had a PCN reaction occurring within the last 10 years: Unknown If all of the above answers are "NO", then may proceed with Cephalosporin use.      Medication List    STOP taking these medications   cefdinir 300 MG capsule Commonly known as:  OMNICEF     TAKE these medications   Albuterol Sulfate 108 (90 Base) MCG/ACT Aepb Inhale 2 puffs into the lungs every 4 (four) hours as needed (wheezing/SOB). Reported on 06/07/2015   atorvastatin 10 MG tablet Commonly known as:  LIPITOR Take 1 tablet (10 mg total) by mouth daily.   CENTRUM SILVER PO Take 1 tablet by mouth daily.   ciprofloxacin 500 MG tablet Commonly known as:  CIPRO Take 1 tablet (500 mg total) by mouth 2 (two) times daily.   levothyroxine 137 MCG tablet Commonly known as:  SYNTHROID, LEVOTHROID Take 1 tablet (137 mcg total) by mouth daily before breakfast.   metFORMIN 500 MG tablet Commonly known as:  GLUCOPHAGE Take  1 tablet (500 mg total) by mouth 2 (two) times daily with a meal.   metroNIDAZOLE 500 MG tablet Commonly known as:  FLAGYL Take 1 tablet (500 mg total) by mouth every 8 (eight) hours.   montelukast 10 MG tablet Commonly known as:  SINGULAIR Take 1 tablet (10 mg total) by mouth at bedtime.   MUCINEX PO Take 400 mg  by mouth daily as needed for congestion.   PEPTO-BISMOL PO Take 2 tablets by mouth 2 (two) times daily as needed [facility] PAIN).   ROBITUSSIN COUGH/CHEST DM MAX PO Take 15 mLs by mouth daily as needed.   SINEX REGULAR NA Place 1 spray into the nose daily as needed (ALLERGIES).   umeclidinium-vilanterol 62.5-25 MCG/INH Aepb Commonly known as:  ANORO ELLIPTA Inhale 1 puff daily into the lungs.   VASCULERA Tabs Take 1 capsule by mouth daily.      Follow-up Information    [facility], [facility] [place] Follow up on 05/15/2017.   Specialty:  General Surgery Why:  Your appointment is at 11:00AM.  Be at the office 30 minutes early for check in.  Bring photo ID and insurance information.   Contact information: [place] [address] [phone]        Binnie Rail, MD. Schedule an appointment as soon as possible for a visit in 1 week(s).   Specialty:  Internal Medicine Contact information: [place] [address] [phone]          Allergies  Allergen Reactions  . Penicillins Rash    CHILDHOOD ALLERGY Has patient had a PCN reaction causing immediate rash, facial/tongue/throat swelling, SOB or lightheadedness with hypotension: YeS Has patient had a PCN reaction causing severe rash involving mucus membranes or skin necrosis: Unknown Has patient had a PCN reaction that required hospitalization: Unknown Has patient had a PCN reaction occurring within the last 10 years: Unknown If all of the above answers are "NO", then may proceed with Cephalosporin use.     Consultations: General Surgery  Procedures/Studies: Dg Cholangiogram Operative  Result Date: 05/02/2017 CLINICAL DATA:  Cholelithiasis EXAM: INTRAOPERATIVE CHOLANGIOGRAM TECHNIQUE: Cholangiographic images from the C-arm fluoroscopic device were submitted for interpretation post-operatively. Please see the procedural report for the amount of contrast and the fluoroscopy time  utilized. COMPARISON:  CT 05/01/2017 FINDINGS: No persistent filling defects in the common duct. Intrahepatic ducts are incompletely visualized, appearing decompressed centrally. Contrast passes into the duodenum. : Negative for retained common duct stone. Electronically Signed   By: Lucrezia Europe M.D.   On: 05/02/2017 12:33   Ct Abdomen Pelvis W Contrast  Result Date: 05/01/2017 CLINICAL DATA:  72 year old female with nausea vomiting. EXAM: CT ABDOMEN AND PELVIS WITH CONTRAST TECHNIQUE: Multidetector CT imaging of the abdomen and pelvis was performed using the standard protocol following bolus administration of intravenous contrast. CONTRAST:  131mL ISOVUE-300 IOPAMIDOL (ISOVUE-300) INJECTION 61%, <See Chart> ISOVUE-300 IOPAMIDOL (ISOVUE-300) INJECTION 61% COMPARISON:  Right upper quadrant ultrasound dated 05/01/2017 FINDINGS: Lower chest: There bibasilar linear atelectasis/scarring. No intra-abdominal free air or free fluid. Hepatobiliary: The liver is unremarkable. No intrahepatic biliary ductal dilatation. The gallbladder is distended. There is thickened and inflamed appearance of the gallbladder wall with pericholecystic stranding. At least two stones noted in the neck of the gallbladder and region of the proximal cystic duct. Multiple calcific densities in the head of the pancreas adjacent to the central CBD may be related to prior pancreatitis and less likely represent stones within the central CBD.  Pancreas: Coarse calcific foci of the head of the pancreas likely sequela of prior pancreatitis. No active inflammatory changes. No dilatation of the main pancreatic duct or gland atrophy. Spleen: Normal in size without focal abnormality. Adrenals/Urinary Tract: Adrenal glands are unremarkable. Kidneys are normal, without renal calculi, focal lesion, or hydronephrosis. Bladder is unremarkable. Stomach/Bowel: There is loose stool throughout the colon compatible with diarrheal state. Clinical correlation is  recommended. There postsurgical changes of right hemicolectomy with anastomotic suture in the right upper quadrant. There is no bowel obstruction or active inflammation. The appendix is surgically absent. Multiple small sigmoid diverticula noted without active inflammatory changes. Vascular/Lymphatic: The abdominal aorta and IVC appear unremarkable. No portal venous gas. There is no adenopathy. There is a retroaortic left renal vein anatomy. Reproductive: The uterus is anteverted and grossly unremarkable. The ovaries are unremarkable as well. No pelvic mass. Other: There are 2 ventral/supraumbilical fat containing hernias with the largest defect measure approximately 5 cm in transverse axial diameter. No fluid collection or evidence of inflammatory changes. Musculoskeletal: There is degenerative changes of the spine. No acute osseous pathology. IMPRESSION: 1. Cholelithiasis with gallstones in the neck of the gallbladder and proximal cystic duct and findings most consistent with acute cholecystitis. Clinical correlation is recommended. 2. Diarrheal state.  No bowel obstruction or active inflammation. Electronically Signed   By: Anner Crete M.D.   On: 05/01/2017 20:11   US Abdomen Limited Ruq  Result Date: 05/01/2017 CLINICAL DATA:  72 year old female with mid abdominal pain for 2 days. EXAM: ULTRASOUND ABDOMEN LIMITED RIGHT UPPER QUADRANT COMPARISON:  CT Abdomen and Pelvis 05/31/2015. FINDINGS: Gallbladder: There is a 6 millimeter gallstone identified in the gallbladder fundus. However, the gallbladder appears distended with wall thickening up to 7 millimeters (image 17). Despite this, no sonographic Murphy sign was elicited. No definite pericholecystic fluid. Common bile duct: Diameter: 5 millimeters, normal. Liver: Mildly increased liver echogenicity (image 59). No discrete liver lesion. No intrahepatic biliary ductal dilatation. Portal vein is patent on color Doppler imaging with normal direction of blood  flow towards the liver. Other findings: Negative visible right kidney. IMPRESSION: 1. Positive for small gallstone(s), and a distended appearing gallbladder with wall thickening. The constellation is suspicious for Acute Cholecystitis, although no sonographic Murphy sign was elicited. 2. No evidence of acute biliary obstruction that no evidence of biliary ductal obstruction. 3. Mild hepatic steatosis. Electronically Signed   By: Genevie Ann M.D.   On: 05/01/2017 16:24    (Echo, Carotid, EGD, Colonoscopy, ERCP)    Subjective:  Patient seen and examined the management this morning.  Remains comfortable.  Tolerating diet. Discharge Exam: Vitals:   05/03/17 0414 05/03/17 1320  BP: (!) 104/55 (!) 99/59  Pulse: 71 63  Resp: 18 18  Temp: 97.7 F (36.5 C) 97.8 F (36.6 C)  SpO2: 97% 100%   Vitals:   05/02/17 2228 05/03/17 0414 05/03/17 0500 05/03/17 1320  BP: (!) 99/53 (!) 104/55  (!) 99/59  Pulse:  71  63  Resp:  18  18  Temp:  97.7 F (36.5 C)  97.8 F (36.6 C)  TempSrc:  Oral  Oral  SpO2:  97%  100%  Weight:   83.7 kg (184 lb 8 oz)   Height:        General: Pt is alert, awake, not in acute distress Cardiovascular: RRR, S1/S2 +, no rubs, no gallops Respiratory: CTA bilaterally, no wheezing, no rhonchi Abdominal: Soft, NT, ND, bowel sounds +, clean surgical scars Extremities: no edema,  no cyanosis    The results of significant diagnostics from this hospitalization (including imaging, microbiology, ancillary and laboratory) are listed below for reference.     Microbiology: Recent Results (from the past 240 hour(s))  Surgical pcr screen     Status: None   Collection Time: 05/02/17 10:18 AM  Result Value Ref Range Status   MRSA, PCR NEGATIVE NEGATIVE Final   Staphylococcus aureus NEGATIVE NEGATIVE Final    Comment: (NOTE) The Xpert SA Assay (FDA approved for NASAL specimens in patients 2 years of age and older), is one component of a comprehensive surveillance program. It is  not intended to diagnose infection nor to guide or monitor treatment. Performed at [facility], [place] [address] [place], [place] [address]      Labs: BNP (last 3 results) No results for input(s): BNP in the last 8760 hours. Basic Metabolic Panel: Recent Labs  Lab 05/01/17 1523 05/01/17 1529 05/02/17 0214 05/03/17 0518  NA 136 137 136 138  K 3.7 3.5 3.3* 4.0  CL 104 102 104 106  CO2 24  --  25 24  GLUCOSE 151* 151* 138* 169*  BUN 8 6 9 11   CREATININE 0.63 0.50 0.72 0.67  CALCIUM 8.2*  --  8.4* 8.2*   Liver Function Tests: Recent Labs  Lab 05/01/17 1523 05/02/17 0214  AST 19 17  ALT 18 21  ALKPHOS 113 115  BILITOT 0.4 0.5  PROT 6.8 6.7  ALBUMIN 3.0* 3.0*   Recent Labs  Lab 05/01/17 1523  LIPASE 24   No results for input(s): AMMONIA in the last 168 hours. CBC: Recent Labs  Lab 05/01/17 1308 05/01/17 1529 05/02/17 0214 05/03/17 0518  WBC 16.5*  --  15.3* 14.6*  NEUTROABS  --   --   --  12.8*  HGB 13.1 11.6* 11.5* 10.6*  HCT 39.2 34.0* 35.1* 33.0*  MCV 89.3  --  89.5 91.2  PLT 302  --  278 251   Cardiac Enzymes: Recent Labs  Lab 05/01/17 2043 05/02/17 0214 05/02/17 0959  TROPONINI <0.03 <0.03 <0.03   BNP: Invalid input(s): POCBNP CBG: Recent Labs  Lab 05/02/17 2011 05/03/17 0026 05/03/17 0409 05/03/17 0805 05/03/17 1134  GLUCAP 234* 203* 159* 238* 149*   D-Dimer Recent Labs    05/01/17 2043  DDIMER 1.49*   Hgb A1c No results for input(s): HGBA1C in the last 72 hours. Lipid Profile No results for input(s): CHOL, HDL, LDLCALC, TRIG, CHOLHDL, LDLDIRECT in the last 72 hours. Thyroid function studies No results for input(s): TSH, T4TOTAL, T3FREE, THYROIDAB in the last 72 hours.  Invalid input(s): FREET3 Anemia work up No results for input(s): VITAMINB12, FOLATE, FERRITIN, TIBC, IRON, RETICCTPCT in the last 72 hours. Urinalysis    Component Value Date/Time   COLORURINE YELLOW 05/01/2017 [place] 05/01/2017 1232   LABSPEC 1.015 05/01/2017 1232   PHURINE 5.0 05/01/2017 1232   GLUCOSEU NEGATIVE 05/01/2017 1232   HGBUR NEGATIVE 05/01/2017 1232   BILIRUBINUR NEGATIVE 05/01/2017 1232   BILIRUBINUR negative 02/18/2016 1556   KETONESUR 20 (A) 05/01/2017 1232   PROTEINUR NEGATIVE 05/01/2017 1232   UROBILINOGEN negative 02/18/2016 1556   UROBILINOGEN 0.2 01/30/2014 1227   NITRITE NEGATIVE 05/01/2017 1232   LEUKOCYTESUR SMALL (A) 05/01/2017 1232   Sepsis Labs Invalid input(s): PROCALCITONIN,  WBC,  LACTICIDVEN Microbiology Recent Results (from the past 240 hour(s))  Surgical pcr screen     Status: None   Collection Time: 05/02/17 10:18 AM  Result Value  Ref Range Status   MRSA, PCR NEGATIVE NEGATIVE Final   Staphylococcus aureus NEGATIVE NEGATIVE Final    Comment: (NOTE) The Xpert SA Assay (FDA approved for NASAL specimens in patients 2 years of age and older), is one component of a comprehensive surveillance program. It is not intended to diagnose infection nor to guide or monitor treatment. Performed at [facility], [place] [address], [place], [place] [address]      Time coordinating discharge: Over 30 minutes  SIGNED:   Marene Lenz, MD  [facility] 05/03/2017, 2:42 PM Pager [phone]  If 7PM-7AM, please contact night-coverage [email or web address] Password [email or web address]

## 2017-05-03 NOTE — Progress Notes (Signed)
[facility]/Trauma Progress Note  1 Day Post-Op   Assessment/Plan Principal Problem:   Acute cholecystitis Active Problems:   Tachycardia   Nausea & vomiting  Cholecystitis - S/P lap chole with IOC and ventral hernia repair, Dr. Marlou Starks, 03/13  FEN: soft diet VTE: SCD's, lovenox ID: Cipro & Flagyl 03/12>> Foley:  none Follow up: [facility] 2 weeks  DISPO: soft diet. If she tolerates she will be okay for discharge from a surgical standpoint    LOS: 2 days    Subjective: CC: loose stools this am and abdominal soreness  No nausea or vomiting. Tolerated clears. Ambulating well.   Objective: Vital signs in last 24 hours: Temp:  [97.7 F (36.5 C)-99.1 F (37.3 C)] 97.7 F (36.5 C) (03/14 0414) Pulse Rate:  [71-101] 71 (03/14 0414) Resp:  [13-18] 18 (03/14 0414) BP: (91-145)/(52-117) 104/55 (03/14 0414) SpO2:  [95 %-100 %] 97 % (03/14 0414) Weight:  [184 lb 8 oz (83.7 kg)] 184 lb 8 oz (83.7 kg) (03/14 0500) Last BM Date: 05/03/17  Intake/Output from previous day: 03/13 0701 - 03/14 0700 In: 2420 [P.O.:120; I.V.:1600; IV Piggyback:700] Out: 15 [Blood:15] Intake/Output this shift: No intake/output data recorded.  PE: Gen:  Alert, NAD, pleasant, cooperative Pulm:  Rate and effort normal Abd: Soft, obese, not distended, good BS, incisions with glue intact appear well, very mild generalized TTP without guarding Skin: no rashes noted, warm and dry   Anti-infectives: Anti-infectives (From admission, onward)   Start     Dose/Rate Route Frequency Ordered Stop   05/02/17 0600  ciprofloxacin (CIPRO) IVPB 400 mg     400 mg 200 mL/hr over 60 Minutes Intravenous Every 12 hours 05/01/17 1839     05/02/17 0100  metroNIDAZOLE (FLAGYL) IVPB 500 mg     500 mg 100 mL/hr over 60 Minutes Intravenous Every 8 hours 05/01/17 2027     05/01/17 1700  ciprofloxacin (CIPRO) IVPB 400 mg     400 mg 200 mL/hr over 60 Minutes Intravenous  Once 05/01/17 1647 05/01/17 1927    05/01/17 1700  metroNIDAZOLE (FLAGYL) IVPB 500 mg     500 mg 100 mL/hr over 60 Minutes Intravenous  Once 05/01/17 1647 05/01/17 1816      Lab Results:  Recent Labs    05/02/17 0214 05/03/17 0518  WBC 15.3* 14.6*  HGB 11.5* 10.6*  HCT 35.1* 33.0*  PLT 278 251   BMET Recent Labs    05/02/17 0214 05/03/17 0518  NA 136 138  K 3.3* 4.0  CL 104 106  CO2 25 24  GLUCOSE 138* 169*  BUN 9 11  CREATININE 0.72 0.67  CALCIUM 8.4* 8.2*   PT/INR No results for input(s): LABPROT, INR in the last 72 hours. CMP     Component Value Date/Time   NA 138 05/03/2017 0518   NA 140 01/19/2017 1346   K 4.0 05/03/2017 0518   K 4.1 01/19/2017 1346   CL 106 05/03/2017 0518   CO2 24 05/03/2017 0518   CO2 26 01/19/2017 1346   GLUCOSE 169 (H) 05/03/2017 0518   GLUCOSE 90 01/19/2017 1346   BUN 11 05/03/2017 0518   BUN 13.0 01/19/2017 1346   CREATININE 0.67 05/03/2017 0518   CREATININE 0.8 01/19/2017 1346   CALCIUM 8.2 (L) 05/03/2017 0518   CALCIUM 9.3 01/19/2017 1346   PROT 6.7 05/02/2017 0214   PROT 7.4 01/19/2017 1346   ALBUMIN 3.0 (L) 05/02/2017 0214   ALBUMIN 3.6 01/19/2017 1346   AST 17 05/02/2017  0214   AST 19 01/19/2017 1346   ALT 21 05/02/2017 0214   ALT 22 01/19/2017 1346   ALKPHOS 115 05/02/2017 0214   ALKPHOS 148 01/19/2017 1346   BILITOT 0.5 05/02/2017 0214   BILITOT 0.27 01/19/2017 1346   GFRNONAA >60 05/03/2017 0518   GFRAA >60 05/03/2017 0518   Lipase     Component Value Date/Time   LIPASE 24 05/01/2017 1523    Studies/Results: Dg Cholangiogram Operative  Result Date: 05/02/2017 CLINICAL DATA:  Cholelithiasis EXAM: INTRAOPERATIVE CHOLANGIOGRAM TECHNIQUE: Cholangiographic images from the C-arm fluoroscopic device were submitted for interpretation post-operatively. Please see the procedural report for the amount of contrast and the fluoroscopy time utilized. COMPARISON:  CT 05/01/2017 FINDINGS: No persistent filling defects in the common duct. Intrahepatic ducts  are incompletely visualized, appearing decompressed centrally. Contrast passes into the duodenum. : Negative for retained common duct stone. Electronically Signed   By: Lucrezia Europe M.D.   On: 05/02/2017 12:33   Ct Abdomen Pelvis W Contrast  Result Date: 05/01/2017 CLINICAL DATA:  72 year old female with nausea vomiting. EXAM: CT ABDOMEN AND PELVIS WITH CONTRAST TECHNIQUE: Multidetector CT imaging of the abdomen and pelvis was performed using the standard protocol following bolus administration of intravenous contrast. CONTRAST:  181mL ISOVUE-300 IOPAMIDOL (ISOVUE-300) INJECTION 61%, <See Chart> ISOVUE-300 IOPAMIDOL (ISOVUE-300) INJECTION 61% COMPARISON:  Right upper quadrant ultrasound dated 05/01/2017 FINDINGS: Lower chest: There bibasilar linear atelectasis/scarring. No intra-abdominal free air or free fluid. Hepatobiliary: The liver is unremarkable. No intrahepatic biliary ductal dilatation. The gallbladder is distended. There is thickened and inflamed appearance of the gallbladder wall with pericholecystic stranding. At least two stones noted in the neck of the gallbladder and region of the proximal cystic duct. Multiple calcific densities in the head of the pancreas adjacent to the central CBD may be related to prior pancreatitis and less likely represent stones within the central CBD. Pancreas: Coarse calcific foci of the head of the pancreas likely sequela of prior pancreatitis. No active inflammatory changes. No dilatation of the main pancreatic duct or gland atrophy. Spleen: Normal in size without focal abnormality. Adrenals/Urinary Tract: Adrenal glands are unremarkable. Kidneys are normal, without renal calculi, focal lesion, or hydronephrosis. Bladder is unremarkable. Stomach/Bowel: There is loose stool throughout the colon compatible with diarrheal state. Clinical correlation is recommended. There postsurgical changes of right hemicolectomy with anastomotic suture in the right upper quadrant. There  is no bowel obstruction or active inflammation. The appendix is surgically absent. Multiple small sigmoid diverticula noted without active inflammatory changes. Vascular/Lymphatic: The abdominal aorta and IVC appear unremarkable. No portal venous gas. There is no adenopathy. There is a retroaortic left renal vein anatomy. Reproductive: The uterus is anteverted and grossly unremarkable. The ovaries are unremarkable as well. No pelvic mass. Other: There are 2 ventral/supraumbilical fat containing hernias with the largest defect measure approximately 5 cm in transverse axial diameter. No fluid collection or evidence of inflammatory changes. Musculoskeletal: There is degenerative changes of the spine. No acute osseous pathology. IMPRESSION: 1. Cholelithiasis with gallstones in the neck of the gallbladder and proximal cystic duct and findings most consistent with acute cholecystitis. Clinical correlation is recommended. 2. Diarrheal state.  No bowel obstruction or active inflammation. Electronically Signed   By: Anner Crete M.D.   On: 05/01/2017 20:11   US Abdomen Limited Ruq  Result Date: 05/01/2017 CLINICAL DATA:  72 year old female with mid abdominal pain for 2 days. EXAM: ULTRASOUND ABDOMEN LIMITED RIGHT UPPER QUADRANT COMPARISON:  CT Abdomen and Pelvis 05/31/2015. FINDINGS: Gallbladder:  There is a 6 millimeter gallstone identified in the gallbladder fundus. However, the gallbladder appears distended with wall thickening up to 7 millimeters (image 17). Despite this, no sonographic Murphy sign was elicited. No definite pericholecystic fluid. Common bile duct: Diameter: 5 millimeters, normal. Liver: Mildly increased liver echogenicity (image 59). No discrete liver lesion. No intrahepatic biliary ductal dilatation. Portal vein is patent on color Doppler imaging with normal direction of blood flow towards the liver. Other findings: Negative visible right kidney. IMPRESSION: 1. Positive for small gallstone(s),  and a distended appearing gallbladder with wall thickening. The constellation is suspicious for Acute Cholecystitis, although no sonographic Murphy sign was elicited. 2. No evidence of acute biliary obstruction that no evidence of biliary ductal obstruction. 3. Mild hepatic steatosis. Electronically Signed   By: Genevie Ann M.D.   On: 05/01/2017 16:24      Kalman Drape , [facility] 05/03/2017, 10:49 AM  Pager: [phone] Mon-Wed, Friday 7:00am-4:30pm Thurs 7am-11:30am  Consults: [phone]

## 2017-05-03 NOTE — Discharge Instructions (Signed)
[facility] ______[facility] LAPAROSCOPIC SURGERY: POST OP INSTRUCTIONS  No lifting over 10 pounds for 6 weeks - Hernia repair   Always review your discharge instruction sheet given to you by the facility where your surgery was performed. IF YOU HAVE DISABILITY OR FAMILY LEAVE FORMS, YOU MUST BRING THEM TO THE OFFICE FOR PROCESSING.   DO NOT GIVE THEM TO YOUR DOCTOR.  1. A prescription for pain medication may be given to you upon discharge.  Take your pain medication as prescribed, if needed.  If narcotic pain medicine is not needed, then you may take acetaminophen (Tylenol) or ibuprofen (Advil) as needed. 2. Take your usually prescribed medications unless otherwise directed. 3. If you need a refill on your pain medication, please contact your pharmacy.  They will contact our office to request authorization. Prescriptions will not be filled after 5pm or on week-ends. 4. You should follow a light diet the first few days after arrival home, such as soup and crackers, etc.  Be sure to include lots of fluids daily. 5. Most patients will experience some swelling and bruising in the area of the incisions.  Ice packs will help.  Swelling and bruising can take several days to resolve.  6. It is common to experience some constipation if taking pain medication after surgery.  Increasing fluid intake and taking a stool softener (such as Colace) will usually help or prevent this problem from occurring.  A mild laxative (Milk of Magnesia or Miralax) should be taken according to package instructions if there are no bowel movements after 48 hours. 7. Unless discharge instructions indicate otherwise, you may remove your bandages 24-48 hours after surgery, and you may shower at that time.  You may have steri-strips (small skin tapes) in place directly over the incision.  These strips should be left on the skin for 7-10 days.  If your surgeon used skin glue on the incision, you may shower in 24 hours.  The  glue will flake off over the next 2-3 weeks.  Any sutures or staples will be removed at the office during your follow-up visit. 8. ACTIVITIES:  You may resume regular (light) daily activities beginning the next day--such as daily self-care, walking, climbing stairs--gradually increasing activities as tolerated.  You may have sexual intercourse when it is comfortable.  Refrain from any heavy lifting or straining until approved by your doctor. a. You may drive when you are no longer taking prescription pain medication, you can comfortably wear a seatbelt, and you can safely maneuver your car and apply brakes. b. RETURN TO WORK:  __________________________________________________________ 9. You should see your doctor in the office for a follow-up appointment approximately 2-3 weeks after your surgery.  Make sure that you call for this appointment within a day or two after you arrive home to insure a convenient appointment time. 10. OTHER INSTRUCTIONS: __________________________________________________________________________________________________________________________ __________________________________________________________________________________________________________________________ WHEN TO CALL YOUR DOCTOR: 1. Fever over 101.0 2. Inability to urinate 3. Continued bleeding from incision. 4. Increased pain, redness, or drainage from the incision. 5. Increasing abdominal pain  The clinic staff is available to answer your questions during regular business hours.  Please dont hesitate to call and ask to speak to one of the nurses for clinical concerns.  If you have a medical emergency, go to the nearest emergency room or call 911.  A surgeon from [facility] is always on call at the hospital. [address], [place], [place], [place]  [address] ? [id number], [place],  [place]   [address] [phone] ? [phone] ? FAX [phone] Web site:  [email or web address]  [facility] _______[facility]  UMBILICAL OR INGUINAL HERNIA REPAIR: POST OP INSTRUCTIONS  Always review your discharge instruction sheet given to you by the facility where your surgery was performed. IF YOU HAVE DISABILITY OR FAMILY LEAVE FORMS, YOU MUST BRING THEM TO THE OFFICE FOR PROCESSING.   DO NOT GIVE THEM TO YOUR DOCTOR.  1. A  prescription for pain medication may be given to you upon discharge.  Take your pain medication as prescribed, if needed.  If narcotic pain medicine is not needed, then you may take acetaminophen (Tylenol) or ibuprofen (Advil) as needed. 2. Take your usually prescribed medications unless otherwise directed. If you need a refill on your pain medication, please contact your pharmacy.  They will contact our office to request authorization. Prescriptions will not be filled after 5 pm or on week-ends. 3. You should follow a light diet the first 24 hours after arrival home, such as soup and crackers, etc.  Be sure to include lots of fluids daily.  Resume your normal diet the day after surgery. 4.Most patients will experience some swelling and bruising around the umbilicus or in the groin and scrotum.  Ice packs and reclining will help.  Swelling and bruising can take several days to resolve.  6. It is common to experience some constipation if taking pain medication after surgery.  Increasing fluid intake and taking a stool softener (such as Colace) will usually help or prevent this problem from occurring.  A mild laxative (Milk of Magnesia or Miralax) should be taken according to package directions if there are no bowel movements after 48 hours. 7. Unless discharge instructions indicate otherwise, you may remove your bandages 24-48 hours after surgery, and you may shower at that time.  You may have steri-strips (small skin tapes) in place directly over the incision.  These strips should be left on the skin for 7-10 days.  If your  surgeon used skin glue on the incision, you may shower in 24 hours.  The glue will flake off over the next 2-3 weeks.  Any sutures or staples will be removed at the office during your follow-up visit. 8. ACTIVITIES:  You may resume regular (light) daily activities beginning the next day--such as daily self-care, walking, climbing stairs--gradually increasing activities as tolerated.  You may have sexual intercourse when it is comfortable.  Refrain from any heavy lifting or straining until approved by your doctor.  a.You may drive when you are no longer taking prescription pain medication, you can comfortably wear a seatbelt, and you can safely maneuver your car and apply brakes. b.RETURN TO WORK:   _____________________________________________  9.You should see your doctor in the office for a follow-up appointment approximately 2-3 weeks after your surgery.  Make sure that you call for this appointment within a day or two after you arrive home to insure a convenient appointment time. 10.OTHER INSTRUCTIONS: _________________________    _____________________________________  WHEN TO CALL YOUR DOCTOR: 6. Fever over 101.0 7. Inability to urinate 8. Nausea and/or vomiting 9. Extreme swelling or bruising 10. Continued bleeding from incision. 11. Increased pain, redness, or drainage from the incision  The clinic staff is available to answer your questions during regular business hours.  Please dont hesitate to call and ask to speak to one of the nurses for clinical concerns.  If you have a medical emergency, go to the nearest emergency room or call  911.  A surgeon from [facility] is always on call at the hospital   [address] [place], [place], [place]  [address] ?  [address], [place], [place]   [address] [phone] ? [phone] ? FAX [phone] Web site: [email or web address]   Laparoscopic Cholecystectomy, Care After This sheet gives you  information about how to care for yourself after your procedure. Your health care provider may also give you more specific instructions. If you have problems or questions, contact your health care provider. What can I expect after the procedure? After the procedure, it is common to have:  Pain at your incision sites. You will be given medicines to control this pain.  Mild nausea or vomiting.  Bloating and possible shoulder pain from the air-like gas that was used during the procedure.  Follow these instructions at home: Incision care   Follow instructions from your health care provider about how to take care of your incisions. Make sure you: ? Wash your hands with soap and water before you change your bandage (dressing). If soap and water are not available, use hand sanitizer. ? Change your dressing as told by your health care provider. ? Leave stitches (sutures), skin glue, or adhesive strips in place. These skin closures may need to be in place for 2 weeks or longer. If adhesive strip edges start to loosen and curl up, you may trim the loose edges. Do not remove adhesive strips completely unless your health care provider tells you to do that.  Do not take baths, swim, or use a hot tub until your health care provider approves. Ask your health care provider if you can take showers. You may only be allowed to take sponge baths for bathing.  Check your incision area every day for signs of infection. Check for: ? More redness, swelling, or pain. ? More fluid or blood. ? Warmth. ? Pus or a bad smell. Activity  Do not drive or use heavy machinery while taking prescription pain medicine.  Do not lift anything that is heavier than 10 lb (4.5 kg) until your health care provider approves.  Do not play contact sports until your health care provider approves.  Do not drive for 24 hours if you were given a medicine to help you relax (sedative).  Rest as needed. Do not return to work or school  until your health care provider approves. General instructions  Take over-the-counter and prescription medicines only as told by your health care provider.  To prevent or treat constipation while you are taking prescription pain medicine, your health care provider may recommend that you: ? Drink enough fluid to keep your urine clear or pale yellow. ? Take over-the-counter or prescription medicines. ? Eat foods that are high in fiber, such as fresh fruits and vegetables, whole grains, and beans. ? Limit foods that are high in fat and processed sugars, such as fried and sweet foods. Contact a health care provider if:  You develop a rash.  You have more redness, swelling, or pain around your incisions.  You have more fluid or blood coming from your incisions.  Your incisions feel warm to the touch.  You have pus or a bad smell coming from your incisions.  You have a fever.  One or more of your incisions breaks open. Get help right away if:  You have trouble breathing.  You have chest pain.  You have increasing pain in your shoulders.  You faint or feel dizzy when  you stand.  You have severe pain in your abdomen.  You have nausea or vomiting that lasts for more than one day.  You have leg pain. This information is not intended to replace advice given to you by your health care provider. Make sure you discuss any questions you have with your health care provider. Document Released: 02/06/2005 Document Revised: 08/28/2015 Document Reviewed: 07/26/2015 Elsevier Interactive Patient Education  2018 [employer].

## 2017-05-20 NOTE — Patient Instructions (Addendum)
   All other Health Maintenance issues reviewed.   All recommended immunizations and age-appropriate screenings are up-to-date or discussed.  No immunizations administered today.   Medications reviewed and updated.  No changes recommended at this time.    Please followup in 6 months   

## 2017-05-20 NOTE — Progress Notes (Signed)
Subjective:    Patient ID: Jessica Farrell, female    DOB: September 21, 1945, 72 y.o.   MRN: [id number]  HPI The patient is here for follow up from the hospital.  Admitted 05/01/17-05/03/17 for acute cholecystitis.  She went to the ED with nausea /vomiting and abdominal pain in the RUQ.  An [place] was done and showed cholelithiasis and possible acute cholecystitis w/o biliary obstruction.  Surgery was consulted.  She underwent a laparoscopic cholecystectomy with ventral hernia repair on 05/02/17.  She was started on clear liquid diet and it was advanced to soft diet, which she tolerated.  She was discharged home on oral antibiotics.  She has surgery follow up as an outpatient.    Acute cholecystitis:   She had a laparoscopic cholecystectomy with hernia repair.   Diet advanced and tolerated Discharged home on cipro and flagyl for 5 more days, which she completed. Abdominal incisions healing well, has already seen surgery  Tachycardia: resolved D-dimer was elevated saturating fine on RA  Hyperlipidemia: Continued on lipitor She is taking her lipitor daily She is not exercising regularly, but is active at work  Diabetes, type 2: a1c 7.3% on 04/24/17 Was on SS insulin in the hospital She is taking her metformin daily She is not exercising regularly, but is active at work - more active in spring/summer  Cellulitis of lower extremitiy: Resolved, still some redness on the skin - blotchy No edema, wears compression socks Has toe nail fungus and athletes's foot - has not treated this before  Hypothyroidism:  Continued on levothyroxine Due for tsh recheck this month  Hypokalemia: supplmented with potassium  Leukocytosis:   Secondary to cholecystitis Repeat cbc needed   Since the hospitalization/surgery she has felt fatigued and has no energy.  It has not improved at all.  She gets nausea when she is hungry since surgery which is new.  Eating helps.  She has allergy symptoms that have  started and she thinks that is partly why she has no energy.  She is taking her allergy medication.     Medications and allergies reviewed with patient and updated if appropriate.  Patient Active Problem List   Diagnosis Date Noted  . Acute cholecystitis 05/01/2017  . Tachycardia 05/01/2017  . Nausea & vomiting 05/01/2017  . Epigastric pain   . Carcinoid tumor of cecum 01/26/2017  . 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 ([place]) 02/18/2014  . Chronic venous insufficiency 08/02/2012  . HLD (hyperlipidemia) 05/15/2012  . Cellulitis of left anterior lower leg 05/05/2012  . Diabetes mellitus ([place]) 05/05/2012  . Hypothyroidism 05/05/2012  . Asthma 05/05/2012    Current Outpatient Medications on File Prior to Visit  Medication Sig Dispense Refill  . Albuterol Sulfate 108 (90 Base) MCG/ACT AEPB Inhale 2 puffs into the lungs every 4 (four) hours as needed (wheezing/SOB). Reported on 06/07/2015 1 each 0  . atorvastatin (LIPITOR) 10 MG tablet Take 1 tablet (10 mg total) by mouth daily. 90 tablet 1  . Bismuth Subsalicylate (PEPTO-BISMOL PO) Take 2 tablets by mouth 2 (two) times daily as needed [facility] PAIN).    . GuaiFENesin (MUCINEX PO) Take 400 mg by mouth daily as needed for congestion.    Marland Kitchen levothyroxine (SYNTHROID, LEVOTHROID) 137 MCG tablet Take 1 tablet (137 mcg total) by mouth daily before breakfast. 90 tablet 0  . metFORMIN (GLUCOPHAGE) 500 MG tablet Take 1 tablet (500 mg total) by mouth 2 (two) times daily with a meal. 180 tablet 1  . montelukast (SINGULAIR)  10 MG tablet Take 1 tablet (10 mg total) by mouth at bedtime. 90 tablet 1  . Multiple Vitamins-Minerals (CENTRUM SILVER PO) Take 1 tablet by mouth daily.    Marland Kitchen Phenylephrine HCl (SINEX REGULAR NA) Place 1 spray into the nose daily as needed (ALLERGIES).    Marland Kitchen umeclidinium-vilanterol (ANORO ELLIPTA) 62.5-25 MCG/INH AEPB Inhale 1 puff daily into the lungs. 60 each 5   No current facility-administered medications on  file prior to visit.     Past Medical History:  Diagnosis Date  . Asthma   . Cellulitis March  2014   Left foot and ankle  . colon ca dx'd 01/2014  . Colon polyps    2015   . Diabetes mellitus ([place]) 05/05/2012   Type II  . Hyperlipidemia   . Morbid obesity ([place])   . Thyroid disease 1990's   HypoThyroidism    Past Surgical History:  Procedure Laterality Date  . CHOLECYSTECTOMY N/A 05/02/2017   Procedure: LAPAROSCOPIC CHOLECYSTECTOMY WITH INTRAOPERATIVE CHOLANGIOGRAM, VENTRAL HERNIA REPAIR;  Surgeon: Jovita Kussmaul, MD;  Location: [facility];  Service: General;  Laterality: N/A;  . COLON RESECTION N/A 02/03/2014   Procedure: LAPAROSCOPIC ASSISTED BOWEL RESECTION;  Surgeon: Jackolyn Confer, MD;  Location: [facility];  Service: General;  Laterality: N/A;  . FRACTURE SURGERY  2000   Ankle  . TOOTH EXTRACTION     wisdom Teeth    Social History   Socioeconomic History  . Marital status: Divorced    Spouse name: Not on file  . Number of children: Not on file  . Years of education: Not on file  . Highest education level: Not on file  Occupational History  . Not on file  Social Needs  . Financial resource strain: Not on file  . Food insecurity:    Worry: Not on file    Inability: Not on file  . Transportation needs:    Medical: Not on file    Non-medical: Not on file  Tobacco Use  . Smoking status: Never Smoker  . Smokeless tobacco: Never Used  Substance and Sexual Activity  . Alcohol use: No  . Drug use: No  . Sexual activity: Not Currently    Birth control/protection: Post-menopausal  Lifestyle  . Physical activity:    Days per week: Not on file    Minutes per session: Not on file  . Stress: Not on file  Relationships  . Social connections:    Talks on phone: Not on file    Gets together: Not on file    Attends religious service: Not on file    Active member of club or organization: Not on file    Attends meetings of clubs or organizations: Not on file     Relationship status: Not on file  Other Topics Concern  . Not on file  Social History Narrative  . Not on file    Family History  Problem Relation Age of Onset  . Alzheimer's disease Father   . Heart disease Mother   . Arthritis Mother   . Hypertension Mother   . Alzheimer's disease Sister   . Cancer Sister 24       breast cancer   . Heart disease Brother        Heart Disease before age 32  . Colon cancer Neg Hx   . Esophageal cancer Neg Hx   . Rectal cancer Neg Hx   . Stomach cancer Neg Hx     Review of Systems  Constitutional:  Positive for fatigue. Negative for appetite change (good appetite).  HENT: Positive for congestion and postnasal drip.   Respiratory: Positive for cough and wheezing. Negative for shortness of breath.   Cardiovascular: Negative for chest pain, palpitations and leg swelling.  Gastrointestinal: Positive for diarrhea (controlled with fiber pills) and nausea (since surgery - getting better). Negative for abdominal pain and blood in stool.  Genitourinary: Negative for dysuria and hematuria.  Neurological: Positive for headaches (today only, occasional headaches). Negative for light-headedness.       Objective:   Vitals:   05/22/17 1017  BP: 106/66  Pulse: 90  Resp: 16  Temp: 98.3 F (36.8 C)  SpO2: 96%   BP Readings from Last 3 Encounters:  05/22/17 106/66  05/03/17 (!) 99/59  04/24/17 110/68   Wt Readings from Last 3 Encounters:  05/22/17 181 lb (82.1 kg)  05/03/17 184 lb 8 oz (83.7 kg)  04/24/17 187 lb (84.8 kg)   Body mass index is 30.12 kg/m.   Physical Exam    Constitutional: Appears well-developed and well-nourished. No distress.  HENT:  Head: Normocephalic and atraumatic.  Neck: Neck supple. No tracheal deviation present. No thyromegaly present.  No cervical lymphadenopathy Cardiovascular: Normal rate, regular rhythm and normal heart sounds.   No murmur heard. No carotid bruit .  No edema Pulmonary/Chest: Effort normal and  breath sounds normal. No respiratory distress. No has no wheezes. No rales.  Abdomen:  Soft, incisions healed w/o surrounding redness, non tender, non distended Skin: Skin is warm and dry. Not diaphoretic.  Psychiatric: Normal mood and affect. Behavior is normal.      Assessment & Plan:    See Problem List for Assessment and Plan of chronic medical problems.

## 2017-05-22 ENCOUNTER — Ambulatory Visit (INDEPENDENT_AMBULATORY_CARE_PROVIDER_SITE_OTHER): Payer: Medicare HMO | Admitting: Internal Medicine

## 2017-05-22 ENCOUNTER — Encounter: Payer: Self-pay | Admitting: Internal Medicine

## 2017-05-22 VITALS — BP 106/66 | HR 90 | Temp 98.3°F | Resp 16 | Wt 181.0 lb

## 2017-05-22 DIAGNOSIS — B353 Tinea pedis: Secondary | ICD-10-CM | POA: Diagnosis not present

## 2017-05-22 DIAGNOSIS — R000 Tachycardia, unspecified: Secondary | ICD-10-CM | POA: Diagnosis not present

## 2017-05-22 DIAGNOSIS — E785 Hyperlipidemia, unspecified: Secondary | ICD-10-CM | POA: Diagnosis not present

## 2017-05-22 DIAGNOSIS — B351 Tinea unguium: Secondary | ICD-10-CM | POA: Diagnosis not present

## 2017-05-22 DIAGNOSIS — E1165 Type 2 diabetes mellitus with hyperglycemia: Secondary | ICD-10-CM

## 2017-05-22 DIAGNOSIS — K810 Acute cholecystitis: Secondary | ICD-10-CM | POA: Diagnosis not present

## 2017-05-22 DIAGNOSIS — E038 Other specified hypothyroidism: Secondary | ICD-10-CM | POA: Diagnosis not present

## 2017-05-22 MED ORDER — ONETOUCH ULTRASOFT LANCETS MISC: 12 refills | Status: DC

## 2017-05-22 MED ORDER — GLUCOSE BLOOD VI STRP: ORAL_STRIP | 12 refills | Status: DC

## 2017-05-22 MED ORDER — CICLOPIROX 8 % EX SOLN: Freq: Every day | CUTANEOUS | 5 refills | Status: DC

## 2017-05-22 MED ORDER — TERBINAFINE HCL 1 % EX CREA: 1.0000 "application " | TOPICAL_CREAM | Freq: Two times a day (BID) | CUTANEOUS | 2 refills | Status: DC

## 2017-05-22 NOTE — Assessment & Plan Note (Signed)
While in hospital - had elevated d-dimer, but that was likely from cholecystitis Tachycardia resolved

## 2017-05-22 NOTE — Assessment & Plan Note (Signed)
Medication adjusted about one month ago Will recheck tsh next month when she has labs for oncology

## 2017-05-22 NOTE — Assessment & Plan Note (Addendum)
Continue daily statin Regular exercise and healthy diet encouraged  

## 2017-05-22 NOTE — Assessment & Plan Note (Signed)
Lab Results  Component Value Date   HGBA1C 7.3 (H) 04/24/2017   Continue metformin Increase exercise Diabetic diet Will schedule eye appt F/u in 6 months

## 2017-05-22 NOTE — Assessment & Plan Note (Addendum)
S/p lap chole  No residual symptoms Tolerating PO Bowels normal for her - has some chronic diarrhea from prior surgery - improved with fiber pills

## 2017-05-22 NOTE — Assessment & Plan Note (Signed)
Toe nails Ciclopirox daily x 3 months

## 2017-05-30 ENCOUNTER — Encounter: Payer: Self-pay | Admitting: Physician Assistant

## 2017-06-29 ENCOUNTER — Ambulatory Visit: Payer: Medicare Other | Admitting: Internal Medicine

## 2017-07-27 ENCOUNTER — Inpatient Hospital Stay: Payer: Medicare HMO | Attending: Hematology

## 2017-07-27 DIAGNOSIS — C7B8 Other secondary neuroendocrine tumors: Secondary | ICD-10-CM

## 2017-07-27 DIAGNOSIS — Z85060 Personal history of malignant carcinoid tumor of small intestine: Secondary | ICD-10-CM | POA: Insufficient documentation

## 2017-07-27 DIAGNOSIS — E039 Hypothyroidism, unspecified: Secondary | ICD-10-CM | POA: Diagnosis not present

## 2017-07-27 DIAGNOSIS — E119 Type 2 diabetes mellitus without complications: Secondary | ICD-10-CM | POA: Insufficient documentation

## 2017-07-27 DIAGNOSIS — I10 Essential (primary) hypertension: Secondary | ICD-10-CM | POA: Diagnosis not present

## 2017-07-27 LAB — CBC WITH DIFFERENTIAL/PLATELET
BASOS ABS: 0.1 10*3/uL (ref 0.0–0.1)
Basophils Relative: 1 %
EOS ABS: 0.2 10*3/uL (ref 0.0–0.5)
EOS PCT: 3 %
HCT: 38.3 % (ref 34.8–46.6)
Hemoglobin: 12.5 g/dL (ref 11.6–15.9)
LYMPHS PCT: 31 %
Lymphs Abs: 2.2 10*3/uL (ref 0.9–3.3)
MCH: 28.5 pg (ref 25.1–34.0)
MCHC: 32.7 g/dL (ref 31.5–36.0)
MCV: 87 fL (ref 79.5–101.0)
Monocytes Absolute: 0.5 10*3/uL (ref 0.1–0.9)
Monocytes Relative: 7 %
NEUTROS PCT: 58 %
Neutro Abs: 4.2 10*3/uL (ref 1.5–6.5)
PLATELETS: 218 10*3/uL (ref 145–400)
RBC: 4.4 MIL/uL (ref 3.70–5.45)
RDW: 13.6 % (ref 11.2–14.5)
WBC: 7.2 10*3/uL (ref 3.9–10.3)

## 2017-07-27 LAB — COMPREHENSIVE METABOLIC PANEL
ALT: 29 U/L (ref 0–55)
AST: 23 U/L (ref 5–34)
Albumin: 3.6 g/dL (ref 3.5–5.0)
Alkaline Phosphatase: 190 U/L — ABNORMAL HIGH (ref 40–150)
Anion gap: 8 (ref 3–11)
BILIRUBIN TOTAL: 0.4 mg/dL (ref 0.2–1.2)
BUN: 16 mg/dL (ref 7–26)
CO2: 25 mmol/L (ref 22–29)
CREATININE: 0.81 mg/dL (ref 0.60–1.10)
Calcium: 9.3 mg/dL (ref 8.4–10.4)
Chloride: 106 mmol/L (ref 98–109)
Glucose, Bld: 136 mg/dL (ref 70–140)
POTASSIUM: 4.5 mmol/L (ref 3.5–5.1)
Sodium: 139 mmol/L (ref 136–145)
TOTAL PROTEIN: 7.2 g/dL (ref 6.4–8.3)

## 2017-07-30 LAB — CHROMOGRANIN A: Chromogranin A: 1 nmol/L (ref 0–5)

## 2017-07-30 NOTE — Progress Notes (Signed)
[place]  Telephone:([phone]) [phone] Fax:(336) [phone]  Clinic Follow up Note   Patient Care Team: Binnie Rail, MD as PCP - General (Internal Medicine) Truitt Merle, MD as Consulting Physician (Hematology) Jackolyn Confer, MD as Consulting Physician (General Surgery)   Date of Service: 08/03/2017  CHIEF COMPLAINTS:  Follow up carcinoid tumor    Oncology History   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   Staging form: Colon and Rectum, AJCC 7th Edition     Clinical: T3, N2, M0 - Unsigned       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 ([place])   01/21/2014 Imaging    CT: Distal small bowel obstruction due to 3.4 cm soft tissue mass near the ileocecal valve, with adjacent right lower quadrant mesenteric lymphadenopathy      02/03/2014 Pathologic Stage    invasive well diff neuroendocrine tumor (carcinoid), 3cm, pT3pN2 (5/25 nodes positive). negative margines.      02/03/2014 Surgery    Laparoscopic-assisted right colectomy and resection of distal ileum      02/03/2014 Initial Diagnosis    Metastatic malignant neuroendocrine tumor of the ileocecal valve to regional lymph nodes.       05/31/2015 Imaging    CT A/P w contrast  IMPRESSION: 1. No evidence of metastatic disease. 2. Question mild basilar subpleural pulmonary fibrosis.       HISTORY OF PRESENTING ILLNESS: Jessica Farrell is very nice 72 year old white female who was referred by GI Dr. Zella Richer for newly diagnosed small intestine carcinoid tumor.   She presented with 5-6 week history of mid and generalized abdominal discomfort with episodes of sharp intense abdominal pain, intermittent nausea and vomiting and diarrhea with loose stools generally postprandially. She was referred to GI Dr. Lillia Dallas who ordered a CT of the abdomen which was done on 01/21/2014. It shows a 2.6 x 3.4 cm soft tissue mass at the ileocecal valve there is mild adjacent adenopathy. There is  also moderate dilation of the distal small bowel loops. Unclear whether this represents an adenocarcinoma or possibly a carcinoid. She underwent Colonoscopy 01/28/2014 and a circumferential mass was found within the ileocecal valve and partially obstructing the lumen ,multiple biopsies were performed which came back negative for malignancy.   She presented to ED with abdominal pain, nausea and vomiting on 02/03/2014 and was admitted for SBO, and underwent Laparoscopic-assisted right colectomy and resection of distal ileum on 02/03/2014 by Dr. Zella Richer .   She has recovered well after surgery. The incision related pain is near resolved, still has small discharge form the surgical wound. She eats well, she has been having diarrhea since the surgery, improved, 3 loose BM daily now. She lost about 50 lbs in the past 2 years, which she contributes mainly to her diet control for DM.   CURRENT THERAPY: Observation   INTERIM HISTORY:  Jessica Farrell returns for follow-up. She was last seen by me 6 months ago. Since her last visit, she was admitted to the hospital for cholecystitis and underwent cholecystectomy and umbilical hernia repair. She returned to her normal activities, and has no complaints regarding surgery. 2 days ago, she was nauseated. She complains of chest and nasal congestion. No fever. She is allergic to trees, pollen, and dust. Pertinent positives are listed and detailed within the above HPI.      MEDICAL HISTORY:  Past Medical History:  Diagnosis Date  . Asthma   . Cellulitis March  2014   Left foot and ankle  . colon ca  dx'd 01/2014  . Colon polyps    2015   . Diabetes mellitus ([place]) 05/05/2012   Type II  . Hyperlipidemia   . Morbid obesity ([place])   . Thyroid disease 1990's   HypoThyroidism    SURGICAL HISTORY: Past Surgical History:  Procedure Laterality Date  . CHOLECYSTECTOMY N/A 05/02/2017   Procedure: LAPAROSCOPIC CHOLECYSTECTOMY WITH INTRAOPERATIVE CHOLANGIOGRAM,  VENTRAL HERNIA REPAIR;  Surgeon: Jovita Kussmaul, MD;  Location: [facility];  Service: General;  Laterality: N/A;  . COLON RESECTION N/A 02/03/2014   Procedure: LAPAROSCOPIC ASSISTED BOWEL RESECTION;  Surgeon: Jackolyn Confer, MD;  Location: [facility];  Service: General;  Laterality: N/A;  . FRACTURE SURGERY  2000   Ankle  . TOOTH EXTRACTION     wisdom Teeth    SOCIAL HISTORY: History   Social History  . Marital Status: Divorced    Spouse Name: N/A    Number of Children: 2  . Years of Education: N/A   Occupational History  . Secretary, still work part time    Social History Main Topics  . Smoking status: Never Smoker   . Smokeless tobacco: Never Used  . Alcohol Use: No  . Drug Use: No  . Sexual Activity: Not Currently    Birth Control/ Protection: Post-menopausal   Other Topics Concern  . Not on file   Social History Narrative    FAMILY HISTORY: Family History  Problem Relation Age of Onset  . Alzheimer's disease Father   . Heart disease Mother   . Arthritis Mother   . Hypertension Mother   . Alzheimer's disease Sister   . Cancer Sister 39       breast cancer   . Heart disease Brother        Heart Disease before age 24  . Colon cancer Neg Hx   . Esophageal cancer Neg Hx   . Rectal cancer Neg Hx   . Stomach cancer Neg Hx     ALLERGIES:  is allergic to penicillins.  MEDICATIONS:  Current Outpatient Medications  Medication Sig Dispense Refill  . Albuterol Sulfate 108 (90 Base) MCG/ACT AEPB Inhale 2 puffs into the lungs every 4 (four) hours as needed (wheezing/SOB). Reported on 06/07/2015 1 each 0  . atorvastatin (LIPITOR) 10 MG tablet Take 1 tablet (10 mg total) by mouth daily. 90 tablet 1  . Bismuth Subsalicylate (PEPTO-BISMOL PO) Take 2 tablets by mouth 2 (two) times daily as needed [facility] PAIN).    . ciclopirox (PENLAC) 8 % solution Apply topically at bedtime. Apply over nail & surrounding skin. Apply daily over previous coat. After 7 days, may remove w alcohol  &continue 6.6 mL 5  . GuaiFENesin (MUCINEX PO) Take 400 mg by mouth daily as needed for congestion.    . Lancets (ONETOUCH ULTRASOFT) lancets Use as instructed once daily for diabetes 100 each 12  . levothyroxine (SYNTHROID, LEVOTHROID) 137 MCG tablet Take 1 tablet (137 mcg total) by mouth daily before breakfast. 90 tablet 0  . metFORMIN (GLUCOPHAGE) 500 MG tablet Take 1 tablet (500 mg total) by mouth 2 (two) times daily with a meal. 180 tablet 1  . montelukast (SINGULAIR) 10 MG tablet Take 1 tablet (10 mg total) by mouth at bedtime. 90 tablet 1  . Multiple Vitamins-Minerals (CENTRUM SILVER PO) Take 1 tablet by mouth daily.    Marland Kitchen Phenylephrine HCl (SINEX REGULAR NA) Place 1 spray into the nose daily as needed (ALLERGIES).    Marland Kitchen terbinafine (LAMISIL AT) 1 % cream Apply 1  application topically 2 (two) times daily. 42 g 2  . umeclidinium-vilanterol (ANORO ELLIPTA) 62.5-25 MCG/INH AEPB Inhale 1 puff daily into the lungs. 60 each 5   No current facility-administered medications for this visit.     REVIEW OF SYSTEMS:  Constitutional: Denies fevers, chills or abnormal night sweats (+) fatigue  Eyes: Denies blurriness of vision, double vision or watery eyes Ears, nose, mouth, throat, and face: Denies mucositis or sore throat Respiratory: Denies dyspnea or wheezes (+) cough Cardiovascular: Denies palpitation, chest discomfort or lower extremity swelling Gastrointestinal:  Denies, heartburn and diarrhea (+) Nausea Skin: Denies abnormal skin rashes.  Lymphatics: Denies new lymphadenopathy or easy bruising Neurological:Denies numbness, tingling or new weaknesses Behavioral/Psych: Mood is stable, no new changes  All other systems were reviewed with the patient and are negative.  PHYSICAL EXAMINATION: ECOG PERFORMANCE STATUS: 1  Vitals:   08/03/17 1401  BP: 131/87  Pulse: 95  Resp: 18  Temp: 98.3 F (36.8 C)  SpO2: 97%   Filed Weights   08/03/17 1401  Weight: 182 lb 1.6 oz (82.6 kg)     GENERAL:alert, no distress and comfortable. SKIN: skin color, texture, turgor are normal, no rashes or significant lesions. EYES: normal, conjunctiva are pink and non-injected, sclera clear OROPHARYNX:no exudate, no erythema and lips, buccal mucosa, and tongue normal  NECK: supple, thyroid normal size, non-tender, without nodularity LYMPH:  no palpable lymphadenopathy in the cervical, axillary or inguinal LUNGS: clear to auscultation and percussion with normal breathing effort (+) cough HEART: regular rate & rhythm and no murmurs and no lower extremity edema ABDOMEN:abdomen soft, non-tender and normal bowel sounds.Surgical incision healing nicely w/o bleeding, oozing, or inflammation. Musculoskeletal:no cyanosis of digits and no clubbing  PSYCH: alert & oriented x 3 with fluent speech NEURO: no focal motor/sensory deficits  LABORATORY DATA:  I have reviewed the data as listed Lab Results  Component Value Date   WBC 7.2 07/27/2017   HGB 12.5 07/27/2017   HCT 38.3 07/27/2017   MCV 87.0 07/27/2017   PLT 218 07/27/2017   Recent Labs    05/01/17 1523  05/02/17 0214 05/03/17 0518 07/27/17 1248  NA 136   < > 136 138 139  K 3.7   < > 3.3* 4.0 4.5  CL 104   < > 104 106 106  CO2 24  --  25 24 25   GLUCOSE 151*   < > 138* 169* 136  BUN 8   < > 9 11 16   CREATININE 0.63   < > 0.72 0.67 0.81  CALCIUM 8.2*  --  8.4* 8.2* 9.3  GFRNONAA >60  --  >60 >60 >60  GFRAA >60  --  >60 >60 >60  PROT 6.8  --  6.7  --  7.2  ALBUMIN 3.0*  --  3.0*  --  3.6  AST 19  --  17  --  23  ALT 18  --  21  --  29  ALKPHOS 113  --  115  --  190*  BILITOT 0.4  --  0.5  --  0.4   < > = values in this interval not displayed.    Results for ELSIA, LASOTA" (MRN [id number]) as of 01/26/2017 14:39  Ref. Range 03/02/2014 15:49 05/18/2014 15:45 11/23/2014 11:52 06/15/2015 12:45 08/03/2017 12:48  Chromogranin A Latest Ref Range: <=15 ng/mL 5 7 7 11 1   01/19/17: 2 07/19/16: 1 01/19/17: 2   PATHOLOGY  REPORT  Small intestine, resection for tumor, ascending 02/03/2014 -  INVASIVE WELL DIFFERENTIATED NEUROENDOCRINE TUMOR (CARCINOID), SPANNING 3 CM IN GREATEST DIMENSION. - TUMOR INVADES MUSCULARIS PROPRIA TO INVOLVE SUBSEROSAL SOFT TISSUES. - MARGINS ARE NEGATIVE. - FIVE OF TWENTY-FIVE LYMPH NODES ARE POSITIVE FOR METASTATIC LOW GRADE NEUROENDOCRINE TUMOR (5/25). - EXTRACAPSULAR EXTENSION IS IDENTIFIED. - SEE ONCOLOGY TEMPLATE. Specimen: Portion of terminal ileum with attached proximal portion of right colon. Procedure: Laparoscopic assisted right colectomy and resection of distal ileum. Tumor Site: Distal terminal ileum, 3 cm proximal to ileocecal valve. Tumor Size: 3 cm in greatest dimension. Tumor Focality: Unifocal. Histologic Type: Well differentiated neuroendocrine tumor (carcinoid tumor) . Histologic Grade : G1: Low grade. Mitotic Rate low mitotic rate, less than 1/10 mitoses per hpf (high power field). Microscopic Tumor Extension: Tumor invades through muscularis propria to involve subserosal soft tissues, Margins: Proximal Margin: Negative. Distal Margin: Negative. Mesenteric (Radial) Margin : Negative. All margins uninvolved by neuroendocrine tumor: Distance from closest margin: 4.5 cm Margin: Mesenteric margin. Lymph-Vascular Invasion: Tumor is seen involving a subcapsular sinus space (lymphatic space) in one of the lymph nodes. Perineural Invasion: Not identified. Lymph nodes: number examined - 25; number positive: 5. TNM Staging: pT3, pN1.   RADIOGRAPHIC STUDIES: I have personally reviewed the radiological images as listed and agreed with the findings in the report.  CT abdomen and pelvis 05/31/2015 IMPRESSION: 1. No evidence of metastatic disease. 2. Question mild basilar subpleural pulmonary fibrosis.   ASSESSMENT & PLAN:  72 y.o.  female with newly diagnosed carcinoid tumor at the terminal ileum near the ileumcecal valve.  1. Well differentiated low-grade  neuroendocrine tumor of terminal ileum (carcinoid tumor), pT3 N1 M0, stage IIIB -Her tumor has been completely resected. Surgical margins were negative. -I previously discussed her diagnosis and the staging with her in great details.  -There is no role of adjuvant chemotherapy for resected carcinoid tumor.  -We previously discussed the risks of tumor recurrence in the future, including late recurrence. -Her postop chromogranin A and urine 5 HIAA level were normal. Those were not checked before surgery. -I previously recommended a total of 10 years surveillance, with office visit, lab every 6 months for the first 2 years, then every 6-12 months afterwards. CT scans can be considered for surveillance, per [place] and guideline. -I previously discussed her restaging CT scan on 05/31/2015, which showed no evidence of recurrence. -I previously recommended continue annual surveillance CT scan. However her insurance does not pay for CT scans. She only wants to do them if she absolutely has to. - Labs from 07/27/2017 show CBC WNL, CMP with Alkaline Phosphatase 190, which is slightly elevated, could be related to her recent cholecystitis, and Chromogranin A WNL. -Patient is clinically doing very well, asymptomatic, exam was unremarkable, no clinical concern of recurrence. -It has been about 3 years from diagnosis. We will repeat CT every 2 years. After 5 years we will follow up every 2 years.  -Continue clinical surveillance. - Last hospital visit notes and CT scan from 05/01/2017 results were reviewed and discussed with pt. -follow-up in 12 month   2. HTN, DM, hypothyroidism -She will continue medication and follow-up with her primary care physician. -Her TSH levels have been elevated. Her PCP increased her Synthroid to 128mcg   3.  Cholecystitis status post colectomy in March 2019 -She has recovered well.  Plan:  - Lab and f/u in one year with lab one week before  -She will follow-up with her primary  care physician every 6 months  All questions were answered. The patient knows to  call the clinic with any problems, questions or concerns. I spent 20 minutes counseling the patient face to face. The total time spent in the appointment was 25 minutes and more than 50% was on counseling.  Dierdre Searles Dweik am acting as scribe for Dr. Truitt Merle.  I have reviewed the above documentation for accuracy and completeness, and I agree with the above.      Truitt Merle, MD 08/03/2017

## 2017-08-03 ENCOUNTER — Inpatient Hospital Stay: Payer: Medicare HMO | Admitting: Hematology

## 2017-08-03 ENCOUNTER — Telehealth: Payer: Self-pay | Admitting: Hematology

## 2017-08-03 ENCOUNTER — Encounter: Payer: Self-pay | Admitting: Hematology

## 2017-08-03 ENCOUNTER — Other Ambulatory Visit: Payer: Self-pay

## 2017-08-03 VITALS — BP 131/87 | HR 95 | Temp 98.3°F | Resp 18 | Ht 65.0 in | Wt 182.1 lb

## 2017-08-03 DIAGNOSIS — Z85060 Personal history of malignant carcinoid tumor of small intestine: Secondary | ICD-10-CM | POA: Diagnosis not present

## 2017-08-03 DIAGNOSIS — E119 Type 2 diabetes mellitus without complications: Secondary | ICD-10-CM

## 2017-08-03 DIAGNOSIS — I10 Essential (primary) hypertension: Secondary | ICD-10-CM | POA: Diagnosis not present

## 2017-08-03 DIAGNOSIS — E039 Hypothyroidism, unspecified: Secondary | ICD-10-CM | POA: Diagnosis not present

## 2017-08-03 DIAGNOSIS — C7B8 Other secondary neuroendocrine tumors: Secondary | ICD-10-CM

## 2017-08-03 NOTE — Telephone Encounter (Signed)
Scheduled apt per 6/14 los - gave patient aVS and calender per los.

## 2017-08-03 NOTE — Telephone Encounter (Signed)
Scheduled appt per 6/14 los - gave patient AVS and calender per los.   

## 2017-08-04 ENCOUNTER — Other Ambulatory Visit: Payer: Self-pay | Admitting: Internal Medicine

## 2017-08-08 LAB — 5 HIAA, QUANTITATIVE, URINE, 24 HOUR
5-HIAA, Ur: 0.7 mg/L
5-HIAA,Quant.,24 Hr Urine: 0.7 mg/24 hr (ref 0.0–14.9)
TOTAL VOLUME: 1000

## 2017-08-09 ENCOUNTER — Telehealth: Payer: Self-pay

## 2017-08-09 NOTE — Telephone Encounter (Signed)
-----   Message from Truitt Merle, MD sent at 08/09/2017  9:04 AM EDT ----- Please let her know her urine test was normal, thanks  Truitt Merle  08/09/2017

## 2017-08-09 NOTE — Telephone Encounter (Signed)
Per Dr. Burr Medico left voice message for patient that her urine test was normal.  Encouraged patient to call back if she has questions.

## 2017-08-20 ENCOUNTER — Other Ambulatory Visit: Payer: Self-pay | Admitting: Internal Medicine

## 2017-09-24 ENCOUNTER — Other Ambulatory Visit: Payer: Self-pay | Admitting: Internal Medicine

## 2017-09-24 DIAGNOSIS — E1165 Type 2 diabetes mellitus with hyperglycemia: Secondary | ICD-10-CM

## 2017-11-05 IMAGING — US US ABDOMEN LIMITED
1 series · 14 of 25 positions shown · non-contrast
Comparison: CT Abdomen and Pelvis [DATE].

CLINICAL DATA: 71-year-old female with mid abdominal pain for 2
days.

EXAM:
ULTRASOUND ABDOMEN LIMITED RIGHT UPPER QUADRANT

[Series 1: us abdomen limited · 0.30mm/px · 14 of 59 slices shown]
[im 1/59]
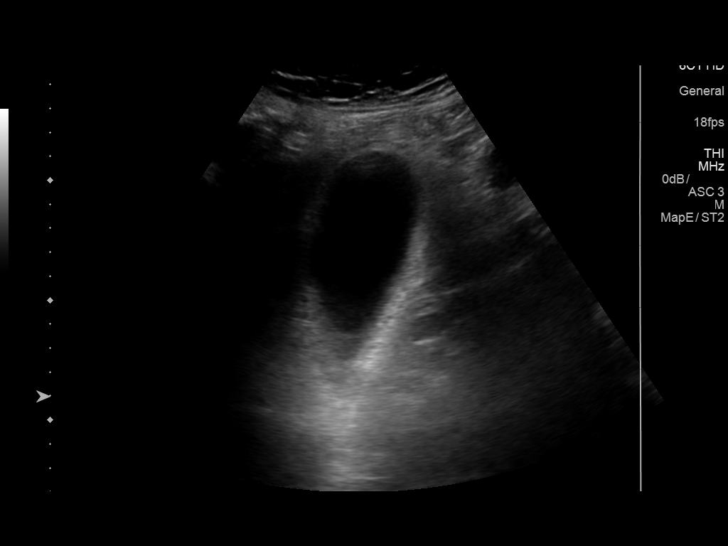
[im 5/59]
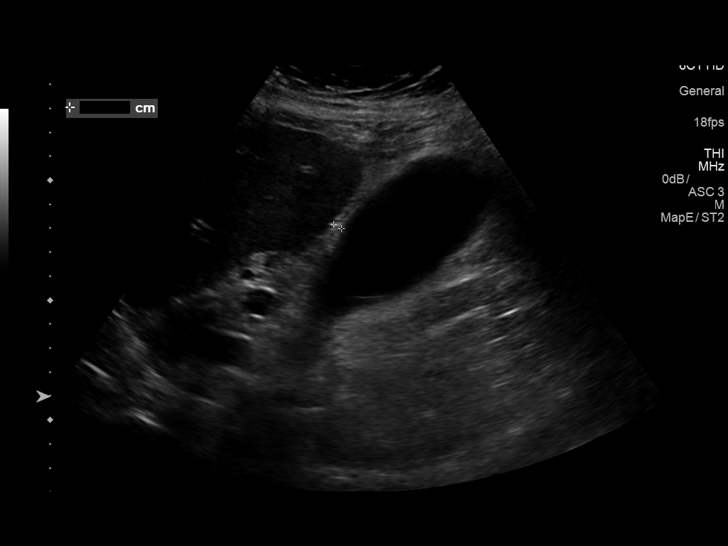
[im 10/59]
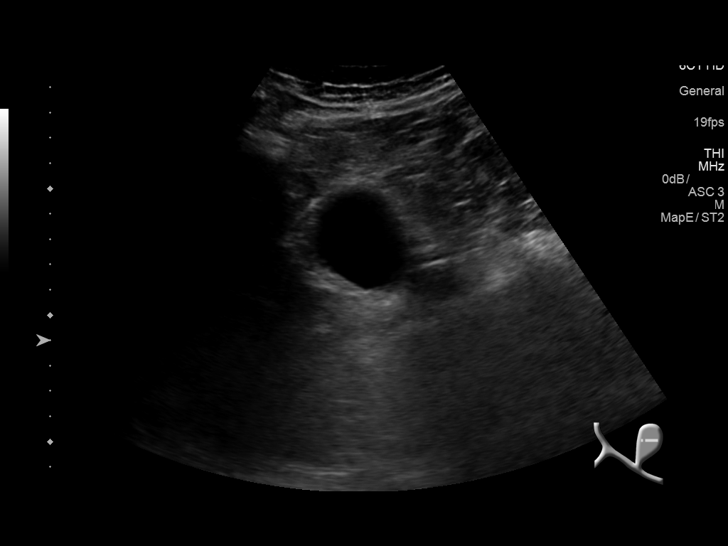
[im 15/59]
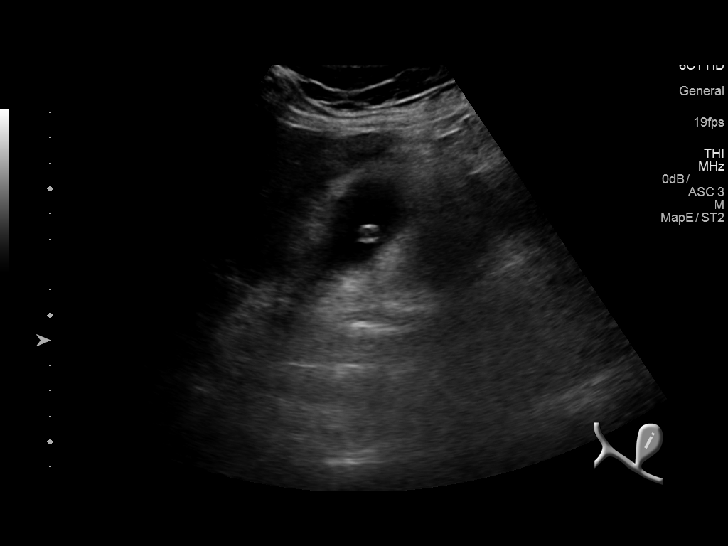
[im 20/59]
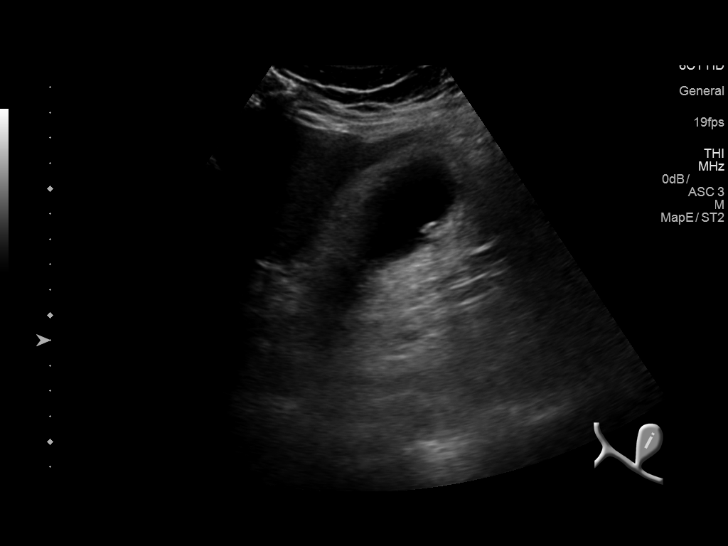
[im 22/59]
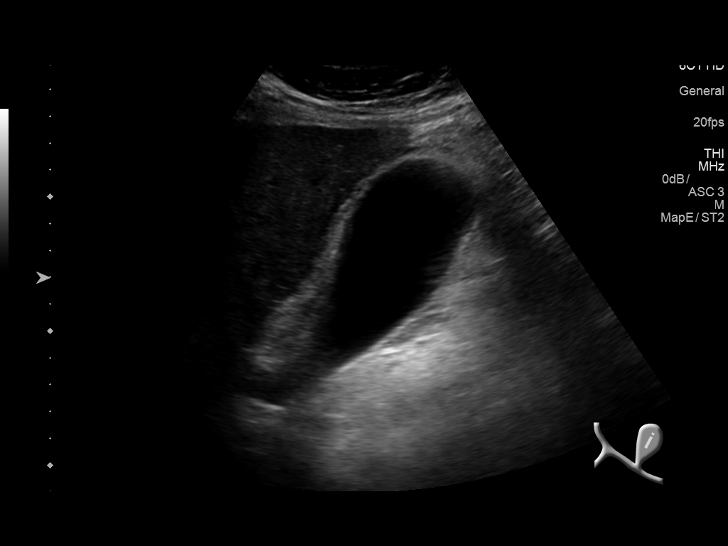
[im 27/59]
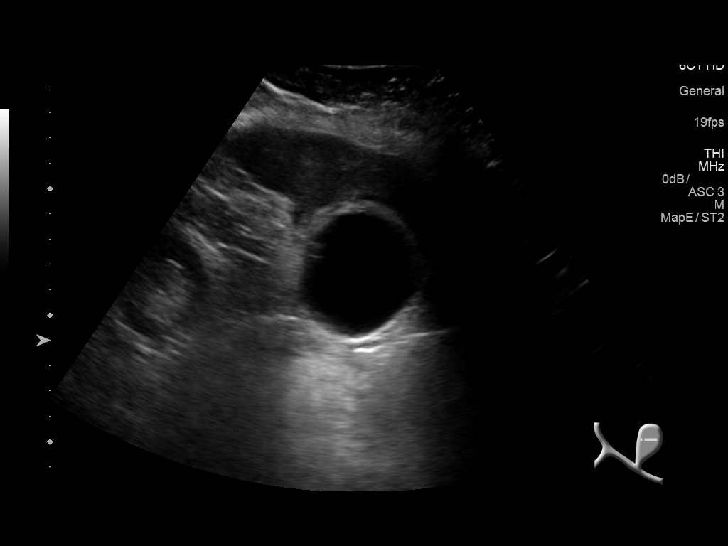
[im 32/59]
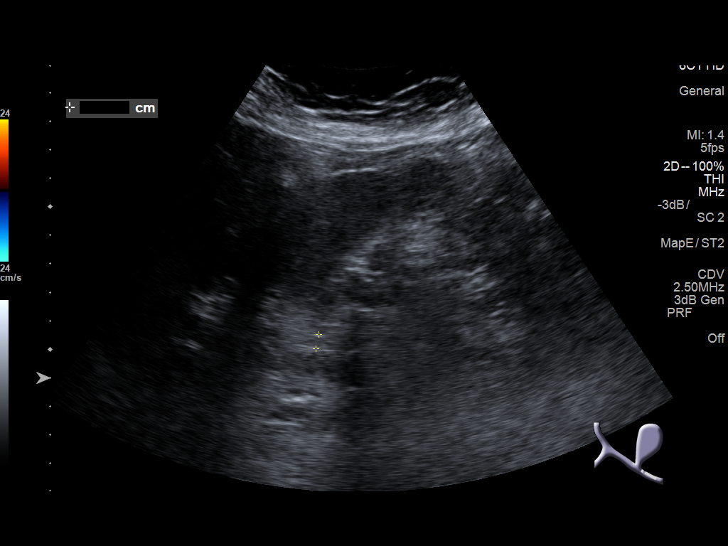
[im 37/59]
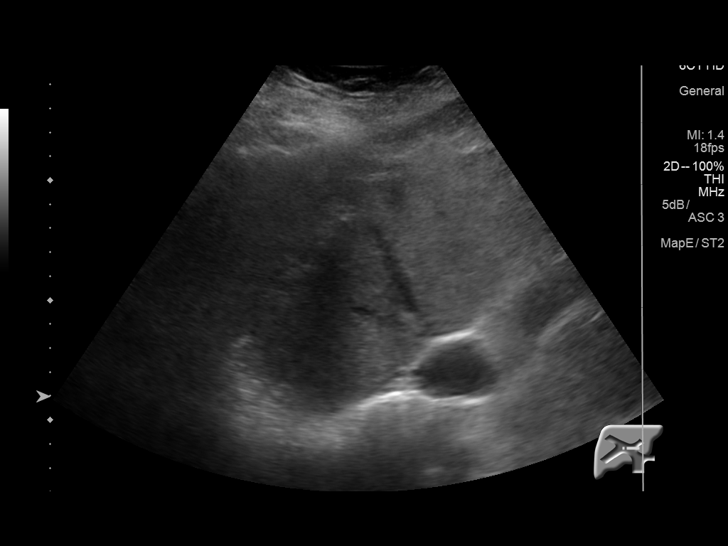
[im 39/59]
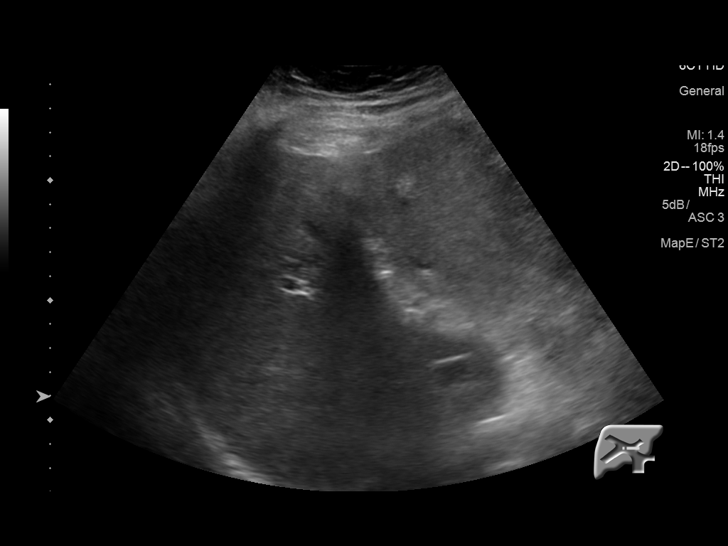
[im 44/59]
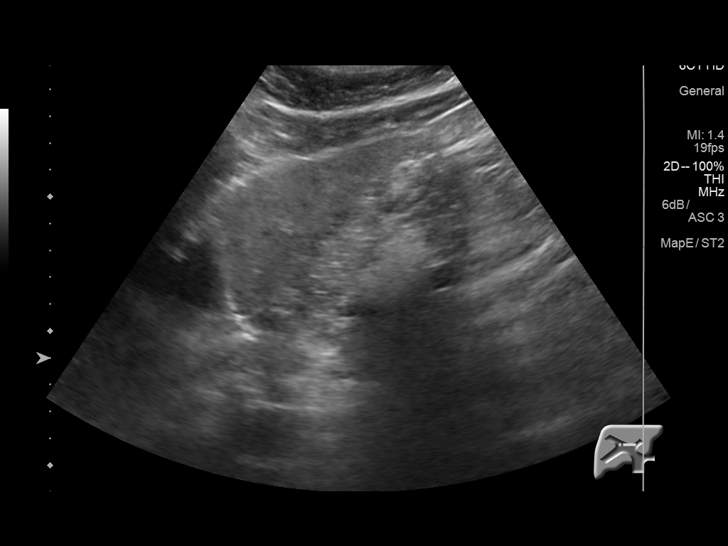
[im 49/59]
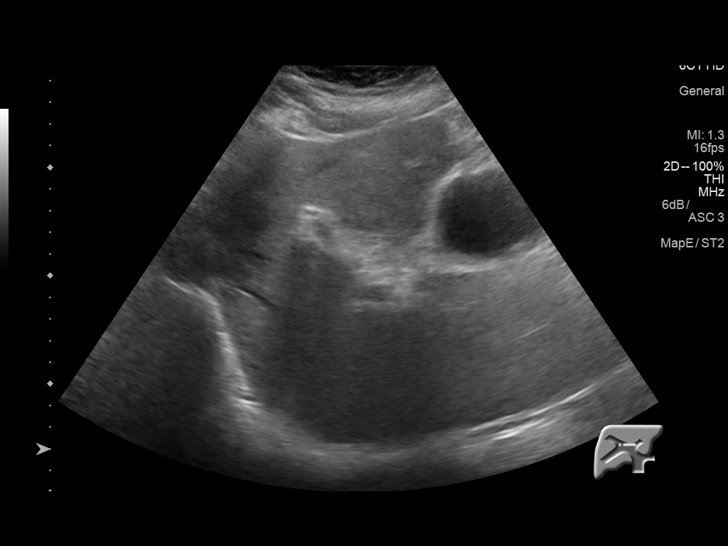
[im 54/59]
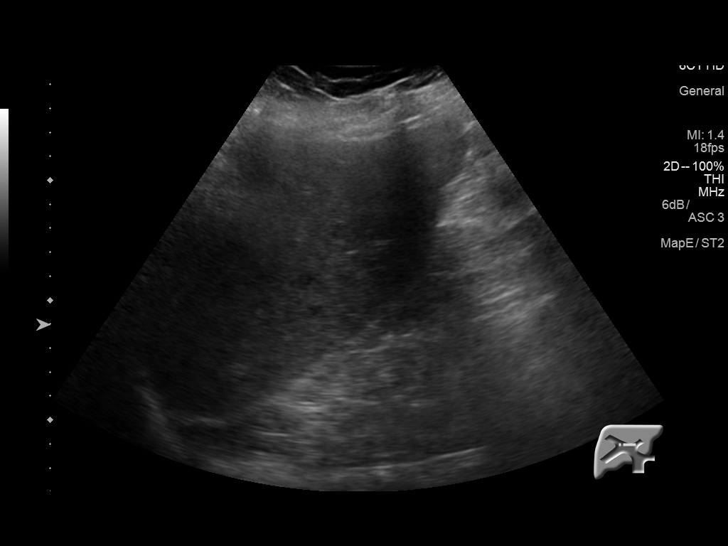
[im 59/59]
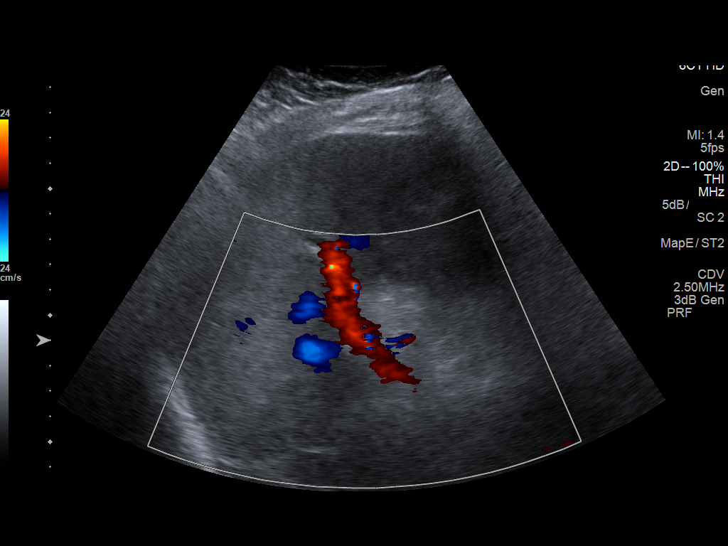

[14 of 25 positions shown; findings below may reference images not displayed]

FINDINGS: Gallbladder:

There is a 6 millimeter gallstone identified in the gallbladder
fundus. However, the gallbladder appears distended with wall
thickening up to 7 millimeters (image 17). Despite this, no
sonographic Murphy sign was elicited. No definite pericholecystic
fluid.

Common bile duct:

Diameter: 5 millimeters, normal.

Liver:

Mildly increased liver echogenicity (image 59). No discrete liver
lesion. No intrahepatic biliary ductal dilatation. Portal vein is
patent on color Doppler imaging with normal direction of blood flow
towards the liver.

Other findings: Negative visible right kidney.
IMPRESSION: 1. Positive for small gallstone(s), and a distended appearing
gallbladder with wall thickening. The constellation is suspicious
for Acute Cholecystitis, although no sonographic Murphy sign was
elicited.
2. No evidence of acute biliary obstruction that no evidence of
biliary ductal obstruction.
3. Mild hepatic steatosis.

## 2017-11-06 ENCOUNTER — Telehealth: Payer: Self-pay | Admitting: Internal Medicine

## 2017-11-06 ENCOUNTER — Encounter: Payer: Self-pay | Admitting: Internal Medicine

## 2017-11-06 ENCOUNTER — Other Ambulatory Visit (INDEPENDENT_AMBULATORY_CARE_PROVIDER_SITE_OTHER): Payer: Medicare HMO

## 2017-11-06 DIAGNOSIS — E039 Hypothyroidism, unspecified: Secondary | ICD-10-CM

## 2017-11-06 LAB — TSH: TSH: 0.8 u[IU]/mL (ref 0.35–4.50)

## 2017-11-06 NOTE — Telephone Encounter (Signed)
Copied from [place] [phone]. Topic: Quick Communication - Rx Refill/Question >> Nov 06, 2017  8:48 AM Bea Graff, NT wrote: Medication: levothyroxine (SYNTHROID, LEVOTHROID) 137 MCG tablet   Has the patient contacted their pharmacy? Yes.   (Agent: If no, request that the patient contact the pharmacy for the refill.) (Agent: If yes, when and what did the pharmacy advise?)  Preferred Pharmacy (with phone number or street name): [facility], [place] [phone] (Phone) [phone] (Fax)    Agent: Please be advised that RX refills may take up to 3 business days. We ask that you follow-up with your pharmacy.

## 2017-11-06 NOTE — Telephone Encounter (Signed)
Levothyroxine 137 mcg  refill Last Refill:08/06/17 # 90 Last OV: 05/22/17 PCP: [facility]: [facility]  Last TSH  04/24/17  Result 13.87  Need recheck of TSH according to chart.

## 2017-11-06 NOTE — Telephone Encounter (Signed)
LVM for pt letting her know she needs labs for more refills of her levothyroxine. We will only send a 30 day supply to a local pharmacy until she gets labs done.

## 2017-11-08 NOTE — Telephone Encounter (Signed)
Pt got TSH labs drawn.

## 2017-11-20 NOTE — Patient Instructions (Addendum)
Call and schedule your mammogram and bone density scan-  [facility] 7 a.m.-6:30 p.m., Monday 7 a.m.-5 p.m., Tuesday-Friday Schedule an appointment by calling [phone]   Your a1c was checked today.    Flu immunization administered today.    Medications reviewed and updated.  Changes include :   Gabapentin at night for the tingling in your legs.  Your prescription(s) have been submitted to your pharmacy. Please take as directed and contact our office if you believe you are having problem(s) with the medication(s).  Please followup in 6 months

## 2017-11-20 NOTE — Progress Notes (Signed)
Subjective:    Patient ID: Jessica Farrell, female    DOB: August 30, 1945, 72 y.o.   MRN: [id number]  HPI The patient is here for follow up.  Umbilical hernia;  She has intermittent pain especially when she coughs a lot.  The pain is not severe and not on a regular basis.  She has not noticed any skin changes.  Hyperlipidemia: She is taking her medication daily. She is compliant with a low fat/cholesterol diet. She is active, but not exercising regularly. She denies myalgias.   Diabetes: She is taking her medication daily as prescribed. She is compliant with a diabetic diet. She is not exercising regularly. She checks her feet daily and denies foot lesions.   Hypothyroidism:  She is taking her medication daily.  She denies any recent changes in energy or weight that are unexplained.     Medications and allergies reviewed with patient and updated if appropriate.  Patient Active Problem List   Diagnosis Date Noted  . Umbilical hernia without obstruction or gangrene 11/21/2017  . Onychomycosis 05/22/2017  . Athlete's foot 05/22/2017  . Tachycardia 05/01/2017  . Carcinoid tumor of cecum 01/26/2017  . 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 ([place]) 02/18/2014  . Chronic venous insufficiency 08/02/2012  . HLD (hyperlipidemia) 05/15/2012  . Cellulitis of left anterior lower leg 05/05/2012  . Diabetes mellitus ([place]) 05/05/2012  . Hypothyroidism 05/05/2012  . Asthma 05/05/2012    Current Outpatient Medications on File Prior to Visit  Medication Sig Dispense Refill  . atorvastatin (LIPITOR) 10 MG tablet TAKE 1 TABLET (10 MG TOTAL) BY MOUTH DAILY. 90 tablet 1  . Bismuth Subsalicylate (PEPTO-BISMOL PO) Take 2 tablets by mouth 2 (two) times daily as needed [facility] PAIN).    . ciclopirox (PENLAC) 8 % solution Apply topically at bedtime. Apply over nail & surrounding skin. Apply daily over previous coat. After 7 days, may remove w alcohol &continue 6.6 mL 5  . GuaiFENesin  (MUCINEX PO) Take 400 mg by mouth daily as needed for congestion.    . Lancets (ONETOUCH ULTRASOFT) lancets Use as instructed once daily for diabetes 100 each 12  . metFORMIN (GLUCOPHAGE) 500 MG tablet TAKE 1 TABLET TWICE DAILY WITH A MEAL 180 tablet 1  . montelukast (SINGULAIR) 10 MG tablet Take 1 tablet (10 mg total) by mouth at bedtime. 90 tablet 1  . Multiple Vitamins-Minerals (CENTRUM SILVER PO) Take 1 tablet by mouth daily.    Marland Kitchen Phenylephrine HCl (SINEX REGULAR NA) Place 1 spray into the nose daily as needed (ALLERGIES).    Marland Kitchen terbinafine (LAMISIL AT) 1 % cream Apply 1 application topically 2 (two) times daily. 42 g 2  . umeclidinium-vilanterol (ANORO ELLIPTA) 62.5-25 MCG/INH AEPB Inhale 1 puff daily into the lungs. 60 each 5   No current facility-administered medications on file prior to visit.     Past Medical History:  Diagnosis Date  . Asthma   . Cellulitis March  2014   Left foot and ankle  . colon ca dx'd 01/2014  . Colon polyps    2015   . Diabetes mellitus ([place]) 05/05/2012   Type II  . Hyperlipidemia   . Morbid obesity ([place])   . Thyroid disease 1990's   HypoThyroidism    Past Surgical History:  Procedure Laterality Date  . CHOLECYSTECTOMY N/A 05/02/2017   Procedure: LAPAROSCOPIC CHOLECYSTECTOMY WITH INTRAOPERATIVE CHOLANGIOGRAM, VENTRAL HERNIA REPAIR;  Surgeon: Jovita Kussmaul, MD;  Location: [facility];  Service: General;  Laterality: N/A;  .  COLON RESECTION N/A 02/03/2014   Procedure: LAPAROSCOPIC ASSISTED BOWEL RESECTION;  Surgeon: Jackolyn Confer, MD;  Location: [facility];  Service: General;  Laterality: N/A;  . FRACTURE SURGERY  2000   Ankle  . TOOTH EXTRACTION     [facility]is[facility] Teeth    Social History   Socioeconomic History  . Marital status: Divorced    Spouse name: Not on file  . Number of children: Not on file  . Years of education: Not on file  . Highest education level: Not on file  Occupational History  . Not on file  Social Needs  . Financial resource  strain: Not on file  . Food insecurity:    Worry: Not on file    Inability: Not on file  . Transportation needs:    Medical: Not on file    Non-medical: Not on file  Tobacco Use  . Smoking status: Never Smoker  . Smokeless tobacco: Never Used  Substance and Sexual Activity  . Alcohol use: No  . Drug use: No  . Sexual activity: Not Currently    Birth control/protection: Post-menopausal  Lifestyle  . Physical activity:    Days per week: Not on file    Minutes per session: Not on file  . Stress: Not on file  Relationships  . Social connections:    Talks on phone: Not on file    Gets together: Not on file    Attends religious service: Not on file    Active member of club or organization: Not on file    Attends meetings of clubs or organizations: Not on file    Relationship status: Not on file  Other Topics Concern  . Not on file  Social History Narrative  . Not on file    Family History  Problem Relation Age of Onset  . Alzheimer's disease Father   . Heart disease Mother   . Arthritis Mother   . Hypertension Mother   . Alzheimer's disease Sister   . Cancer Sister 20       breast cancer   . Heart disease Brother        Heart Disease before age 94  . Colon cancer Neg Hx   . Esophageal cancer Neg Hx   . Rectal cancer Neg Hx   . Stomach cancer Neg Hx     Review of Systems  Constitutional: Negative for chills and fever.  Respiratory: Positive for cough (allergy related) and wheezing (occ). Negative for shortness of breath.   Cardiovascular: Negative for chest pain, palpitations and leg swelling (occ).  Neurological: Negative for light-headedness and headaches.       Objective:   Vitals:   11/21/17 1107  BP: 112/76  Pulse: 77  Resp: 16  Temp: 98.8 F (37.1 C)  SpO2: 96%   BP Readings from Last 3 Encounters:  11/21/17 112/76  08/03/17 131/87  05/22/17 106/66   Wt Readings from Last 3 Encounters:  11/21/17 179 lb 3.2 oz (81.3 kg)  08/03/17 182 lb 1.6  oz (82.6 kg)  05/22/17 181 lb (82.1 kg)   Body mass index is 29.82 kg/m.   Physical Exam    Constitutional: Appears well-developed and well-nourished. No distress.  HENT:  Head: Normocephalic and atraumatic.  Neck: Neck supple. No tracheal deviation present. No thyromegaly present.  No cervical lymphadenopathy Cardiovascular: Normal rate, regular rhythm and normal heart sounds.    No carotid bruit .  Trace bilateral lower extremity edema Pulmonary/Chest: Effort normal and breath sounds normal. No  respiratory distress. No has no wheezes. No rales.  Skin: Skin is warm and dry. Not diaphoretic.  Psychiatric: Normal mood and affect. Behavior is normal.      Assessment & Plan:    See Problem List for Assessment and Plan of chronic medical problems.

## 2017-11-21 ENCOUNTER — Encounter: Payer: Self-pay | Admitting: Internal Medicine

## 2017-11-21 ENCOUNTER — Ambulatory Visit (INDEPENDENT_AMBULATORY_CARE_PROVIDER_SITE_OTHER): Payer: Medicare HMO | Admitting: Internal Medicine

## 2017-11-21 VITALS — BP 112/76 | HR 77 | Temp 98.8°F | Resp 16 | Ht 65.0 in | Wt 179.2 lb

## 2017-11-21 DIAGNOSIS — E038 Other specified hypothyroidism: Secondary | ICD-10-CM

## 2017-11-21 DIAGNOSIS — E2839 Other primary ovarian failure: Secondary | ICD-10-CM | POA: Diagnosis not present

## 2017-11-21 DIAGNOSIS — E785 Hyperlipidemia, unspecified: Secondary | ICD-10-CM

## 2017-11-21 DIAGNOSIS — Z23 Encounter for immunization: Secondary | ICD-10-CM

## 2017-11-21 DIAGNOSIS — E1165 Type 2 diabetes mellitus with hyperglycemia: Secondary | ICD-10-CM | POA: Diagnosis not present

## 2017-11-21 DIAGNOSIS — K429 Umbilical hernia without obstruction or gangrene: Secondary | ICD-10-CM | POA: Diagnosis not present

## 2017-11-21 DIAGNOSIS — Z1231 Encounter for screening mammogram for malignant neoplasm of breast: Secondary | ICD-10-CM | POA: Diagnosis not present

## 2017-11-21 DIAGNOSIS — Z13820 Encounter for screening for osteoporosis: Secondary | ICD-10-CM | POA: Diagnosis not present

## 2017-11-21 LAB — POCT GLYCOSYLATED HEMOGLOBIN (HGB A1C): Hemoglobin A1C: 6.9 % — AB (ref 4.0–5.6)

## 2017-11-21 MED ORDER — LEVOTHYROXINE SODIUM 137 MCG PO TABS: 137.0000 ug | ORAL_TABLET | Freq: Every day | ORAL | 3 refills | Status: DC

## 2017-11-21 MED ORDER — GABAPENTIN 100 MG PO CAPS: 100.0000 mg | ORAL_CAPSULE | Freq: Every day | ORAL | 5 refills | Status: DC

## 2017-11-21 MED ORDER — ALBUTEROL SULFATE 108 (90 BASE) MCG/ACT IN AEPB: 2.0000 | INHALATION_SPRAY | RESPIRATORY_TRACT | 8 refills | Status: DC | PRN

## 2017-11-21 NOTE — Assessment & Plan Note (Signed)
Lipid panel has been well controlled Continue statin Increase exercise and work on weight loss to improve triglycerides

## 2017-11-21 NOTE — Assessment & Plan Note (Signed)
Check A1c can increase metformin to 3 times daily if A1c is still elevated Stressed regular exercise Stressed weight loss Stressed low sugar/carb diet

## 2017-11-21 NOTE — Assessment & Plan Note (Signed)
Having intermittent discomfort, no severe pain or skin changes Discussed risk for incarceration and what symptoms and signs to look for This point she would like to hold off on surgery Encourage weight loss She will let me know if she has any increasing pain

## 2017-11-21 NOTE — Assessment & Plan Note (Signed)
Recent TSH in normal range Continue current dose of medication

## 2017-11-30 ENCOUNTER — Other Ambulatory Visit: Payer: Self-pay | Admitting: Internal Medicine

## 2018-01-02 ENCOUNTER — Other Ambulatory Visit: Payer: Self-pay | Admitting: Internal Medicine

## 2018-02-01 ENCOUNTER — Other Ambulatory Visit: Payer: Medicare HMO

## 2018-02-01 ENCOUNTER — Ambulatory Visit: Payer: Medicare HMO

## 2018-02-14 ENCOUNTER — Other Ambulatory Visit: Payer: Self-pay | Admitting: Internal Medicine

## 2018-02-14 DIAGNOSIS — E1165 Type 2 diabetes mellitus with hyperglycemia: Secondary | ICD-10-CM

## 2018-03-11 ENCOUNTER — Other Ambulatory Visit: Payer: Self-pay | Admitting: Internal Medicine

## 2018-03-25 ENCOUNTER — Ambulatory Visit
Admission: RE | Admit: 2018-03-25 | Discharge: 2018-03-25 | Disposition: A | Discharge status: Home / Self Care | Payer: Medicare HMO | Source: Ambulatory Visit | Attending: Internal Medicine | Admitting: Internal Medicine

## 2018-03-25 DIAGNOSIS — Z13820 Encounter for screening for osteoporosis: Secondary | ICD-10-CM

## 2018-03-25 DIAGNOSIS — Z1231 Encounter for screening mammogram for malignant neoplasm of breast: Secondary | ICD-10-CM

## 2018-03-25 DIAGNOSIS — E2839 Other primary ovarian failure: Secondary | ICD-10-CM

## 2018-03-25 DIAGNOSIS — M85852 Other specified disorders of bone density and structure, left thigh: Secondary | ICD-10-CM | POA: Diagnosis not present

## 2018-03-25 DIAGNOSIS — Z780 Asymptomatic menopausal state: Secondary | ICD-10-CM | POA: Diagnosis not present

## 2018-03-25 IMAGING — MG DIGITAL SCREENING BILATERAL MAMMOGRAM WITH TOMO AND CAD
8 series · 8 of 24 positions shown · non-contrast
Comparison: Previous exam(s).

CLINICAL DATA: Screening.

EXAM:
DIGITAL SCREENING BILATERAL MAMMOGRAM WITH TOMO AND CAD

[R MLO synth-2D]
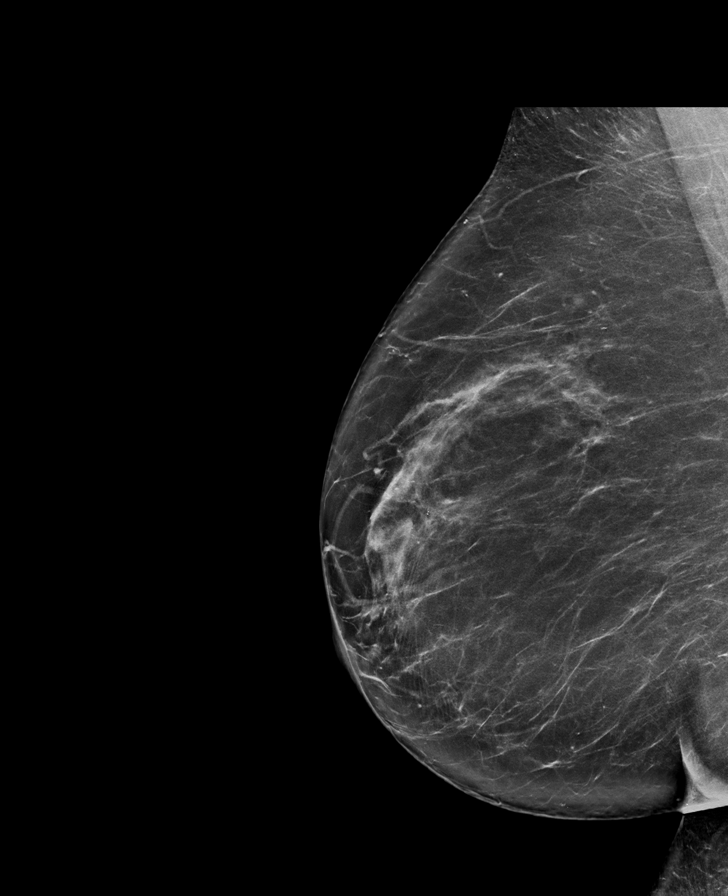

[L MLO synth-2D]
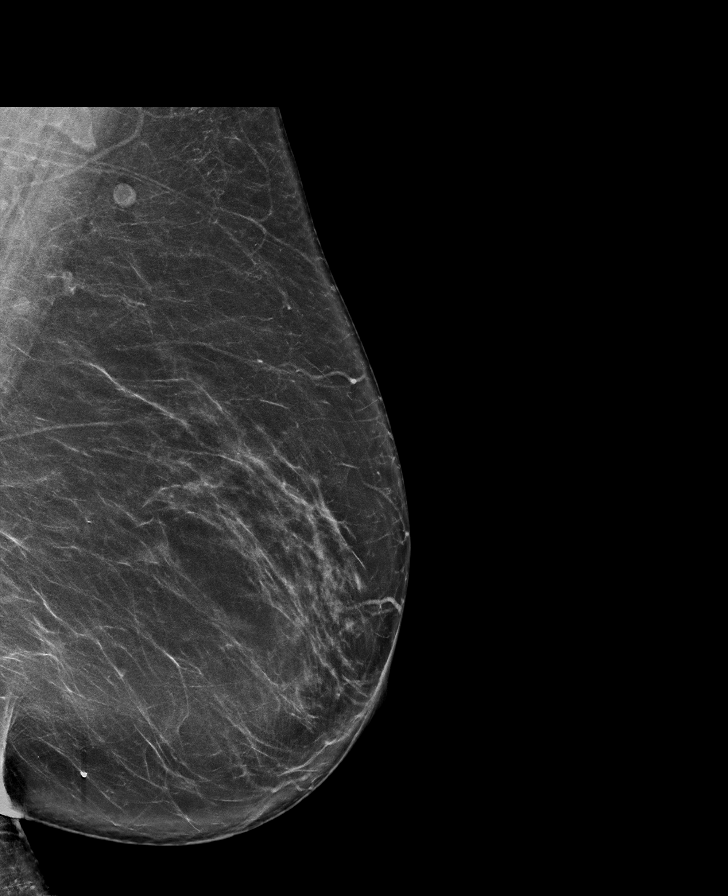

[R CC synth-2D]
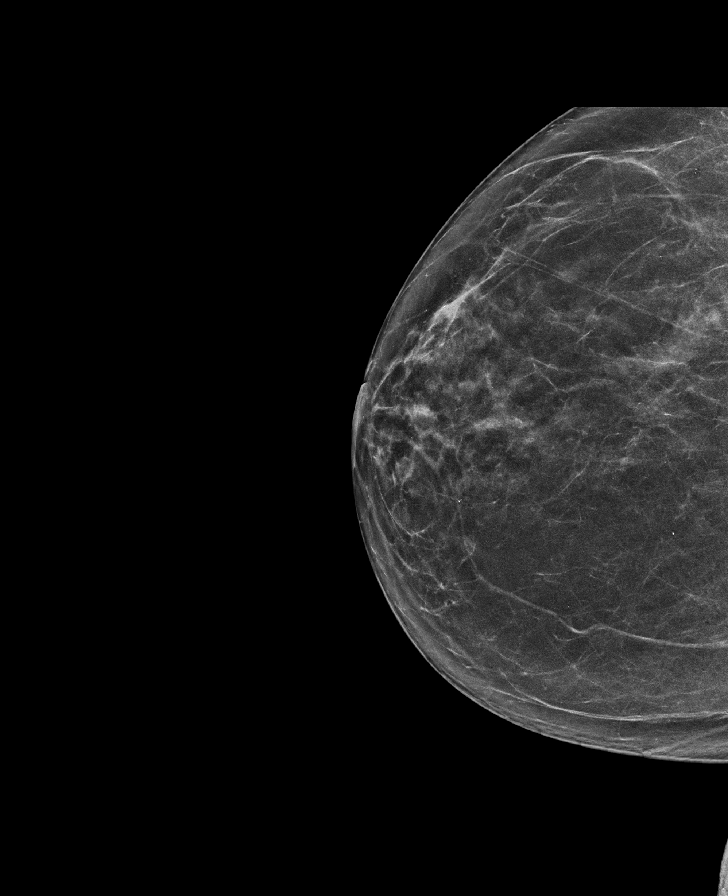

[L CC synth-2D]
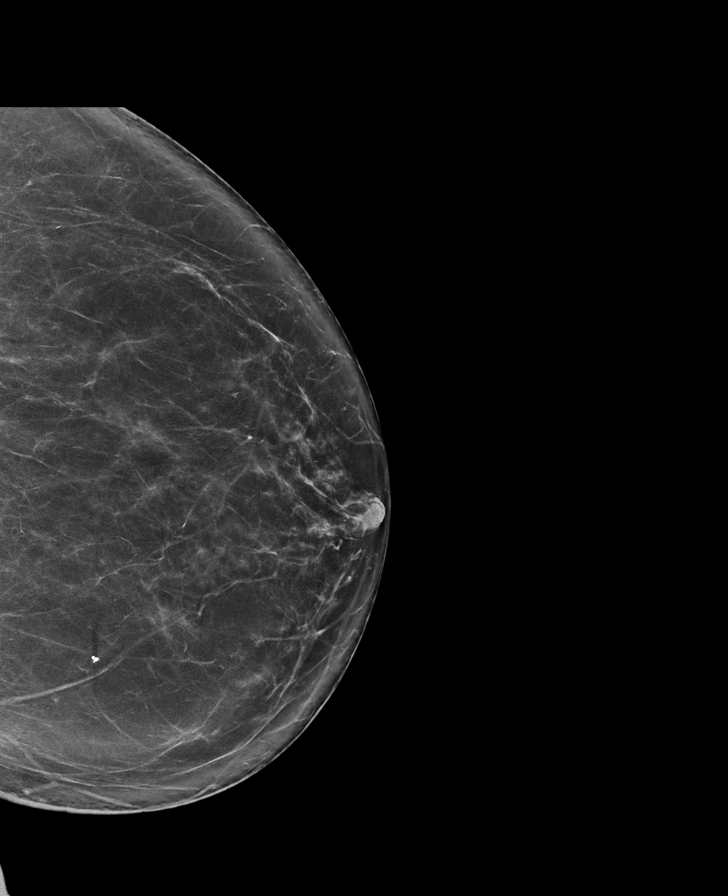

[R MLO tomo · tomo slice 41/82.0]
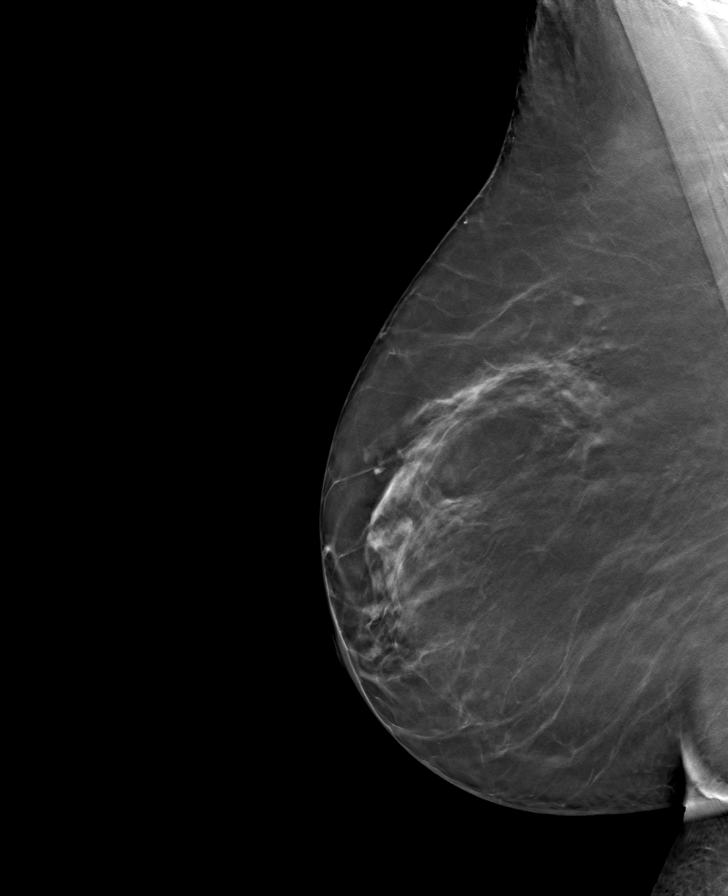

[R CC tomo · tomo slice 35/70.0]
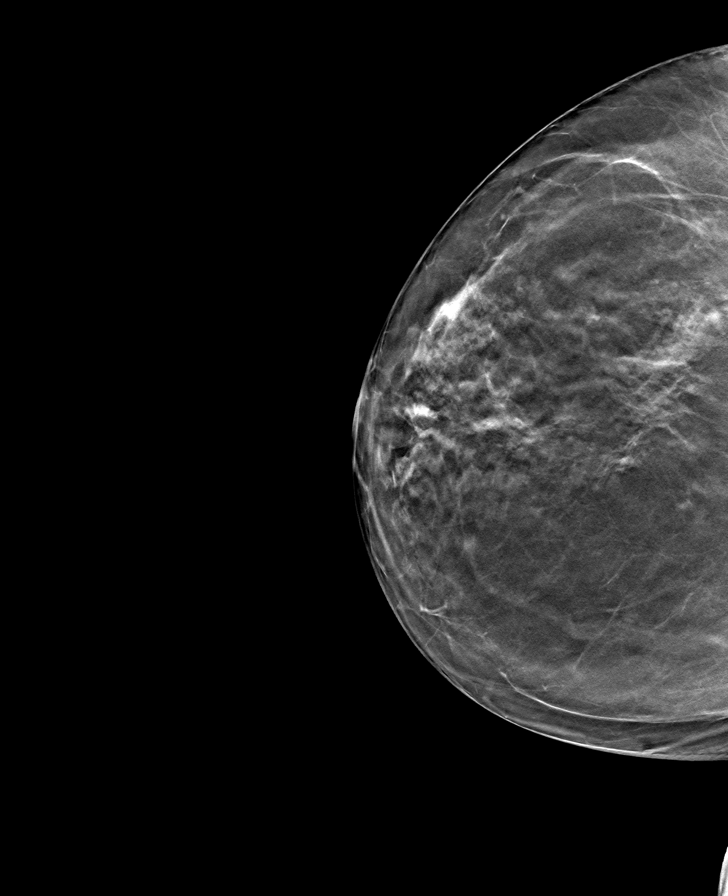

[L MLO tomo · tomo slice 43/85.0]
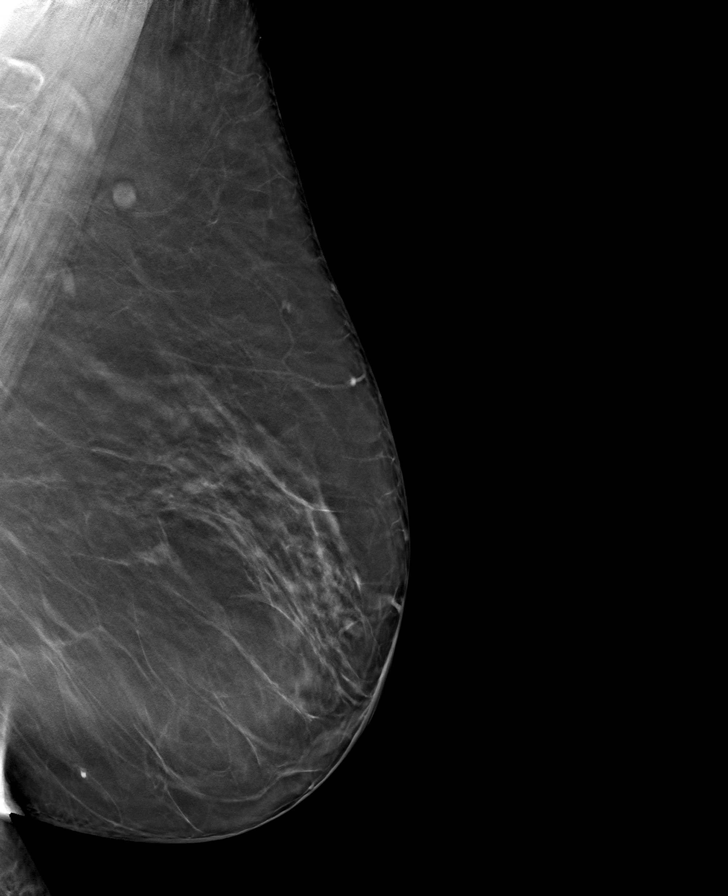

[L CC tomo · tomo slice 39/77.0]
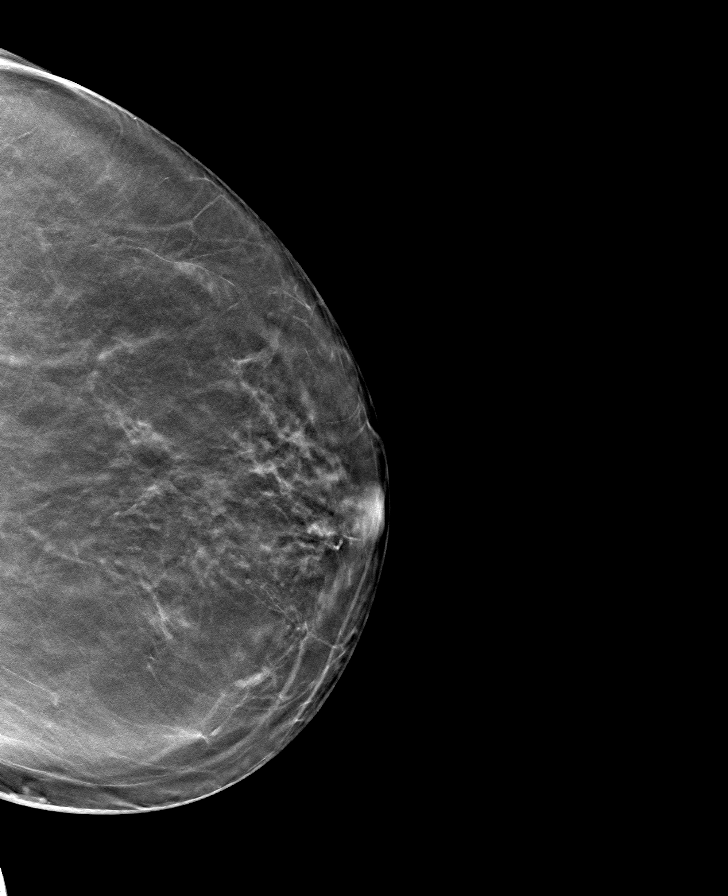

[8 of 24 positions shown; findings below may reference images not displayed]

ACR Breast Density Category b: There are scattered areas of
fibroglandular density.
FINDINGS: There are no findings suspicious for malignancy. Images were
processed with CAD.
IMPRESSION: No mammographic evidence of malignancy. A result letter of this
screening mammogram will be mailed directly to the patient.

RECOMMENDATION:
Screening mammogram in one year. (Code:[TQ])

BI-RADS CATEGORY  1: Negative.

## 2018-03-27 ENCOUNTER — Encounter: Payer: Self-pay | Admitting: Internal Medicine

## 2018-03-27 DIAGNOSIS — M8580 Other specified disorders of bone density and structure, unspecified site: Secondary | ICD-10-CM | POA: Insufficient documentation

## 2018-04-10 ENCOUNTER — Other Ambulatory Visit: Payer: Self-pay | Admitting: Hematology

## 2018-04-10 DIAGNOSIS — C7B8 Other secondary neuroendocrine tumors: Secondary | ICD-10-CM

## 2018-05-13 ENCOUNTER — Other Ambulatory Visit: Payer: Self-pay | Admitting: Internal Medicine

## 2018-05-21 ENCOUNTER — Telehealth: Payer: Self-pay

## 2018-05-21 NOTE — Telephone Encounter (Signed)
Copied from [place] [id number]. Topic: General - Other >> May 20, 2018  5:42 PM Valla Leaver wrote: Reason for CRM: Patient returning cal from [facility]

## 2018-05-21 NOTE — Telephone Encounter (Signed)
Set pt up for facetime visit on Thursday at 10:30 with Dr. Quay Burow. Pt understood.

## 2018-05-22 NOTE — Progress Notes (Signed)
Virtual Visit via Video Note  I connected with Jessica Farrell on 05/23/18 at 10:30 AM EDT by a video enabled telemedicine application and verified that I am speaking with the correct person using two identifiers.   I discussed the limitations of evaluation and management by telemedicine and the availability of in person appointments. The patient expressed understanding and agreed to proceed.  The patient is currently at work and I am in the office.    No referring provider.    History of Present Illness: She is here for follow up of her chronic medical conditions.   She is very active at work - walking, she does not do any regimenented exercise..    Diabetes: She is taking her medication daily as prescribed. She is compliant with a diabetic diet.  She has not been monitoring her sugars at home. She does need a refill of lancets and testing strips.    Hyperlipidemia: She is taking her medication daily. She is compliant with a low fat/cholesterol diet. She denies myalgias.   Hypothyroidism:  She is taking her medication daily.  She denies any recent changes in energy or weight that are unexplained.   Asthma:  She has mild, intermittent asthma.  She is currently not using any inhaler, but thinks she could use one.   She is seasonal allergies and has had a little increase in her asthma symptoms.  She does have a cough that occasionally produces some mucus, occasional wheeze, mild shortness of breath on occasion.  She has not had any fevers or chills.  The symptoms are typical of her allergy/asthma.  She does need a refill of her inhaler.  Tingling in legs: We started her on gabapentin and she has not been taking it on a regular basis.  She does take it as needed and does find that it is helpful.  She does not feel that she needs on a daily basis.  She denies any fevers, chills, chest pain, palpitations, lower extremity edema-she is wearing her compression socks daily, and lightheadedness.    Observations/Objective: Appears well, in no acute distress  Reviewed blood work from last year.  Lab Results  Component Value Date   WBC 7.2 07/27/2017   HGB 12.5 07/27/2017   HCT 38.3 07/27/2017   PLT 218 07/27/2017   GLUCOSE 136 07/27/2017   CHOL 133 07/20/2016   TRIG 204 (H) 07/20/2016   HDL 37 (L) 07/20/2016   LDLCALC 55 07/20/2016   ALT 29 07/27/2017   AST 23 07/27/2017   NA 139 07/27/2017   K 4.5 07/27/2017   CL 106 07/27/2017   CREATININE 0.81 07/27/2017   BUN 16 07/27/2017   CO2 25 07/27/2017   TSH 0.80 11/06/2017   INR 1.04 02/03/2014   HGBA1C 6.9 (A) 11/21/2017   MICROALBUR <0.7 09/15/2015    Assessment and Plan:  See Problem List for Assessment and Plan of chronic medical problems.   Follow Up Instructions:    I discussed the assessment and treatment plan with the patient. The patient was provided an opportunity to ask questions and all were answered. The patient agreed with the plan and demonstrated an understanding of the instructions.   The patient was advised to call back or seek an in-person evaluation if the symptoms worsen or if the condition fails to improve as anticipated.   Follow-up with me in 6 months in the office.  Ideally advised that we could get blood work over the summer once the situation has resolved.  Binnie Rail, MD

## 2018-05-23 ENCOUNTER — Encounter: Payer: Self-pay | Admitting: Internal Medicine

## 2018-05-23 ENCOUNTER — Ambulatory Visit (INDEPENDENT_AMBULATORY_CARE_PROVIDER_SITE_OTHER): Payer: Medicare HMO | Admitting: Internal Medicine

## 2018-05-23 DIAGNOSIS — E1165 Type 2 diabetes mellitus with hyperglycemia: Secondary | ICD-10-CM

## 2018-05-23 DIAGNOSIS — J4520 Mild intermittent asthma, uncomplicated: Secondary | ICD-10-CM | POA: Diagnosis not present

## 2018-05-23 DIAGNOSIS — R202 Paresthesia of skin: Secondary | ICD-10-CM | POA: Diagnosis not present

## 2018-05-23 DIAGNOSIS — E038 Other specified hypothyroidism: Secondary | ICD-10-CM

## 2018-05-23 DIAGNOSIS — E785 Hyperlipidemia, unspecified: Secondary | ICD-10-CM

## 2018-05-23 MED ORDER — ALBUTEROL SULFATE HFA 108 (90 BASE) MCG/ACT IN AERS: 2.0000 | INHALATION_SPRAY | Freq: Four times a day (QID) | RESPIRATORY_TRACT | 8 refills | Status: DC | PRN

## 2018-05-23 MED ORDER — ONETOUCH ULTRASOFT LANCETS MISC: 12 refills | Status: AC

## 2018-05-23 MED ORDER — GLUCOSE BLOOD VI STRP: ORAL_STRIP | 12 refills | Status: DC

## 2018-05-23 NOTE — Assessment & Plan Note (Signed)
She is taking gabapentin as needed for tingling and aching in her legs. She states she does not need on a regular basis, but does find that it is helpful when she does take it. Continue

## 2018-05-23 NOTE — Assessment & Plan Note (Signed)
Clinically euthyroid Continue current dose of levothyroxine 

## 2018-05-23 NOTE — Assessment & Plan Note (Signed)
Continue atorvastatin Will recheck lipid panel, CMP in 6 months

## 2018-05-23 NOTE — Assessment & Plan Note (Signed)
Mild, intermittent Currently slightly more active with cough, occasional wheeze and occasional shortness of breath related to seasonal allergies We will renew albuterol rescue inhaler She will call if symptoms do not improve or worsen

## 2018-05-23 NOTE — Assessment & Plan Note (Signed)
Lab Results  Component Value Date   HGBA1C 6.9 (A) 11/21/2017   Sugars have been fairly controlled We will send in test strips and lancets so she can monitor her sugars at home Ideally we should check A1c once coronavirus situation has resolved, but given stability okay to wait until 6 months Continue increased activity-walking a lot during the day and diabetic diet Continue metformin

## 2018-06-04 ENCOUNTER — Other Ambulatory Visit: Payer: Self-pay

## 2018-06-04 MED ORDER — GLUCOSE BLOOD VI STRP: ORAL_STRIP | 12 refills | Status: AC

## 2018-06-10 ENCOUNTER — Other Ambulatory Visit: Payer: Self-pay

## 2018-06-10 MED ORDER — BLOOD GLUCOSE MONITOR KIT: PACK | 0 refills | Status: DC

## 2018-06-28 ENCOUNTER — Other Ambulatory Visit: Payer: Self-pay | Admitting: Internal Medicine

## 2018-06-28 DIAGNOSIS — E1165 Type 2 diabetes mellitus with hyperglycemia: Secondary | ICD-10-CM

## 2018-07-26 ENCOUNTER — Inpatient Hospital Stay: Payer: Medicare HMO | Attending: Hematology

## 2018-07-26 ENCOUNTER — Other Ambulatory Visit: Payer: Self-pay

## 2018-07-26 DIAGNOSIS — I10 Essential (primary) hypertension: Secondary | ICD-10-CM | POA: Diagnosis not present

## 2018-07-26 DIAGNOSIS — Z85060 Personal history of malignant carcinoid tumor of small intestine: Secondary | ICD-10-CM | POA: Insufficient documentation

## 2018-07-26 DIAGNOSIS — Z9049 Acquired absence of other specified parts of digestive tract: Secondary | ICD-10-CM | POA: Diagnosis not present

## 2018-07-26 DIAGNOSIS — C7B8 Other secondary neuroendocrine tumors: Secondary | ICD-10-CM

## 2018-07-26 DIAGNOSIS — G629 Polyneuropathy, unspecified: Secondary | ICD-10-CM | POA: Insufficient documentation

## 2018-07-26 DIAGNOSIS — E119 Type 2 diabetes mellitus without complications: Secondary | ICD-10-CM | POA: Insufficient documentation

## 2018-07-26 DIAGNOSIS — E039 Hypothyroidism, unspecified: Secondary | ICD-10-CM | POA: Insufficient documentation

## 2018-07-26 LAB — COMPREHENSIVE METABOLIC PANEL
ALT: 19 U/L (ref 0–44)
AST: 16 U/L (ref 15–41)
Albumin: 3.5 g/dL (ref 3.5–5.0)
Alkaline Phosphatase: 177 U/L — ABNORMAL HIGH (ref 38–126)
Anion gap: 9 (ref 5–15)
BUN: 13 mg/dL (ref 8–23)
CO2: 25 mmol/L (ref 22–32)
Calcium: 8.8 mg/dL — ABNORMAL LOW (ref 8.9–10.3)
Chloride: 105 mmol/L (ref 98–111)
Creatinine, Ser: 0.79 mg/dL (ref 0.44–1.00)
GFR calc Af Amer: >60 mL/min (ref >60)
GFR calc non Af Amer: >60 mL/min (ref >60)
Glucose, Bld: 135 mg/dL — ABNORMAL HIGH (ref 70–99)
Potassium: 4.2 mmol/L (ref 3.5–5.1)
Sodium: 139 mmol/L (ref 135–145)
Total Bilirubin: 0.4 mg/dL (ref 0.3–1.2)
Total Protein: 7.2 g/dL (ref 6.5–8.1)

## 2018-07-26 LAB — CBC WITH DIFFERENTIAL/PLATELET
Abs Immature Granulocytes: 0.02 10*3/uL (ref 0.00–0.07)
Basophils Absolute: 0 10*3/uL (ref 0.0–0.1)
Basophils Relative: 1 %
Eosinophils Absolute: 0.2 10*3/uL (ref 0.0–0.5)
Eosinophils Relative: 3 %
HCT: 39 % (ref 36.0–46.0)
Hemoglobin: 12.2 g/dL (ref 12.0–15.0)
Immature Granulocytes: 0 %
Lymphocytes Relative: 34 %
Lymphs Abs: 2.1 10*3/uL (ref 0.7–4.0)
MCH: 27.9 pg (ref 26.0–34.0)
MCHC: 31.3 g/dL (ref 30.0–36.0)
MCV: 89 fL (ref 80.0–100.0)
Monocytes Absolute: 0.5 10*3/uL (ref 0.1–1.0)
Monocytes Relative: 8 %
Neutro Abs: 3.5 10*3/uL (ref 1.7–7.7)
Neutrophils Relative %: 54 %
Platelets: 208 10*3/uL (ref 150–400)
RBC: 4.38 MIL/uL (ref 3.87–5.11)
RDW: 12.2 % (ref 11.5–15.5)
WBC: 6.4 10*3/uL (ref 4.0–10.5)
nRBC: 0 % (ref 0.0–0.2)

## 2018-07-30 ENCOUNTER — Telehealth: Payer: Self-pay | Admitting: Hematology

## 2018-07-30 LAB — CHROMOGRANIN A REBASELINE
Chromogranin A (ng/mL): 61.2 ng/mL (ref 0.0–101.8)
Chromogranin A: 2 nmol/L (ref 0–5)

## 2018-07-30 NOTE — Telephone Encounter (Signed)
Called patient to see if the patient was okay with have a virtual visit per sch message. Was not able to reach the patient, left the appt as it is.

## 2018-07-31 DIAGNOSIS — I10 Essential (primary) hypertension: Secondary | ICD-10-CM | POA: Diagnosis not present

## 2018-07-31 DIAGNOSIS — Z9049 Acquired absence of other specified parts of digestive tract: Secondary | ICD-10-CM | POA: Diagnosis not present

## 2018-07-31 DIAGNOSIS — G629 Polyneuropathy, unspecified: Secondary | ICD-10-CM | POA: Diagnosis not present

## 2018-07-31 DIAGNOSIS — Z85060 Personal history of malignant carcinoid tumor of small intestine: Secondary | ICD-10-CM | POA: Diagnosis not present

## 2018-07-31 DIAGNOSIS — E039 Hypothyroidism, unspecified: Secondary | ICD-10-CM | POA: Diagnosis not present

## 2018-07-31 DIAGNOSIS — E119 Type 2 diabetes mellitus without complications: Secondary | ICD-10-CM | POA: Diagnosis not present

## 2018-07-31 NOTE — Progress Notes (Signed)
[place]   [phone] [phone]   Clinic Follow up Note   Patient Care Team: Binnie Rail, MD as PCP - General (Internal Medicine) Truitt Merle, MD as Consulting Physician (Hematology) Jackolyn Confer, MD as Consulting Physician (General Surgery)  Date of Service:  08/02/2018  CHIEF COMPLAINT: Follow up carcinoid tumor   SUMMARY OF ONCOLOGIC HISTORY: Oncology History Overview Note  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   Staging form: Colon and Rectum, AJCC 7th Edition     Clinical: T3, N2, M0 - Unsigned     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 ([place])  01/21/2014 Imaging   CT: Distal small bowel obstruction due to 3.4 cm soft tissue mass near the ileocecal valve, with adjacent right lower quadrant mesenteric lymphadenopathy   02/03/2014 Pathologic Stage   invasive well diff neuroendocrine tumor (carcinoid), 3cm, pT3pN2 (5/25 nodes positive). negative margines.   02/03/2014 Surgery   Laparoscopic-assisted right colectomy and resection of distal ileum   02/03/2014 Initial Diagnosis   Metastatic malignant neuroendocrine tumor of the ileocecal valve to regional lymph nodes.    05/31/2015 Imaging   CT A/P w contrast  IMPRESSION: 1. No evidence of metastatic disease. 2. Question mild basilar subpleural pulmonary fibrosis.      CURRENT THERAPY:  Surveillance   INTERVAL HISTORY:  Jessica Farrell is here for a follow up of carcinoid tumor. She presents to the clinic alone. She notes she is doing well. She still works as a Surveyor, minerals part time. She notes in the past year she had was given a new inhaler for her wheezing and gabapentin for tingling in her legs. She hardly uses gabapentin. She feels her DM would be better if she ate better. She sees her PCP Dr Quay Burow about this.  She notes her BM improved and now goes 2-3 times a day mostly after meals. She notes hemorrhoids that will bleed but no true  GI bleeding. She has not had a repeat colonoscopy. She notes occasional flushing which will dissipate. This mostly happens when outside in the sun.   She notes she has been helping out with her 41 year old sister who is near death.    REVIEW OF SYSTEMS:   Constitutional: Denies fevers, chills or abnormal weight loss (+) occasional skin flushing  Eyes: Denies blurriness of vision Ears, nose, mouth, throat, and face: Denies mucositis or sore throat Respiratory: Denies cough, dyspnea or wheezes Cardiovascular: Denies palpitation, chest discomfort or lower extremity swelling Gastrointestinal:  Denies nausea, heartburn (+) Controlled diarrhea  Skin: Denies abnormal skin rashes Lymphatics: Denies new lymphadenopathy or easy bruising Neurological: (+) Tingling in her legs, manageable.  Behavioral/Psych: Mood is stable, no new changes  All other systems were reviewed with the patient and are negative.  MEDICAL HISTORY:  Past Medical History:  Diagnosis Date  . Asthma   . Cellulitis March  2014   Left foot and ankle  . colon ca dx'd 01/2014  . Colon polyps    2015   . Diabetes mellitus ([place]) 05/05/2012   Type II  . Hyperlipidemia   . Morbid obesity ([place])   . Thyroid disease 1990's   HypoThyroidism    SURGICAL HISTORY: Past Surgical History:  Procedure Laterality Date  . CHOLECYSTECTOMY N/A 05/02/2017   Procedure: LAPAROSCOPIC CHOLECYSTECTOMY WITH INTRAOPERATIVE CHOLANGIOGRAM, VENTRAL HERNIA REPAIR;  Surgeon: Jovita Kussmaul, MD;  Location: [facility];  Service: General;  Laterality: N/A;  . COLON RESECTION N/A 02/03/2014   Procedure: LAPAROSCOPIC  ASSISTED BOWEL RESECTION;  Surgeon: Jackolyn Confer, MD;  Location: [facility];  Service: General;  Laterality: N/A;  . FRACTURE SURGERY  2000   Ankle  . TOOTH EXTRACTION     wisdom Teeth    I have reviewed the social history and family history with the patient and they are unchanged from previous note.  ALLERGIES:  is allergic to penicillins.   MEDICATIONS:  Current Outpatient Medications  Medication Sig Dispense Refill  . albuterol (PROVENTIL HFA;VENTOLIN HFA) 108 (90 Base) MCG/ACT inhaler Inhale 2 puffs into the lungs every 6 (six) hours as needed for wheezing or shortness of breath. 1 Inhaler 8  . atorvastatin (LIPITOR) 10 MG tablet TAKE 1 TABLET EVERY DAY 90 tablet 0  . Bismuth Subsalicylate (PEPTO-BISMOL PO) Take 2 tablets by mouth 2 (two) times daily as needed [facility] PAIN).    Marland Kitchen blood glucose meter kit and supplies KIT Dispense based on patient and insurance preference. Use up to 2 times daily as directed. DX code: E11.9 1 each 0  . ciclopirox (PENLAC) 8 % solution Apply topically at bedtime. Apply over nail & surrounding skin. Apply daily over previous coat. After 7 days, may remove w alcohol &continue 6.6 mL 5  . gabapentin (NEURONTIN) 100 MG capsule Take 1 capsule (100 mg total) by mouth at bedtime. 30 capsule 5  . glucose blood test strip Based on insurance preference. Use as instructed once daily to check sugars. Dx E11.9 100 each 12  . GuaiFENesin (MUCINEX PO) Take 400 mg by mouth daily as needed for congestion.    . Lancets (ONETOUCH ULTRASOFT) lancets Use as instructed once daily to check sugars. Dx E11.9 100 each 12  . levothyroxine (SYNTHROID, LEVOTHROID) 137 MCG tablet Take 1 tablet (137 mcg total) by mouth daily before breakfast. 90 tablet 3  . metFORMIN (GLUCOPHAGE) 500 MG tablet TAKE 1 TABLET TWICE DAILY WITH A MEAL 180 tablet 1  . montelukast (SINGULAIR) 10 MG tablet Take 1 tablet (10 mg total) by mouth at bedtime. 90 tablet 1  . Multiple Vitamins-Minerals (CENTRUM SILVER PO) Take 1 tablet by mouth daily.    Marland Kitchen Phenylephrine HCl (SINEX REGULAR NA) Place 1 spray into the nose daily as needed (ALLERGIES).    Marland Kitchen terbinafine (LAMISIL AT) 1 % cream Apply 1 application topically 2 (two) times daily. 42 g 2  . umeclidinium-vilanterol (ANORO ELLIPTA) 62.5-25 MCG/INH AEPB Inhale 1 puff daily into the lungs. 60 each 5   No  current facility-administered medications for this visit.     PHYSICAL EXAMINATION: ECOG PERFORMANCE STATUS: 0 - Asymptomatic  Vitals:   08/02/18 1035  BP: 140/86  Pulse: 83  Resp: 18  Temp: 98.5 F (36.9 C)  SpO2: 96%   Filed Weights   08/02/18 1035  Weight: 182 lb 14.4 oz (83 kg)    GENERAL:alert, no distress and comfortable SKIN: skin color, texture, turgor are normal, no rashes or significant lesions EYES: normal, Conjunctiva are pink and non-injected, sclera clear  NECK: supple, thyroid normal size, non-tender, without nodularity LYMPH:  no palpable lymphadenopathy in the cervical, axillary  LUNGS: clear to auscultation and percussion with normal breathing effort HEART: regular rate & rhythm and no murmurs and no lower extremity edema ABDOMEN:abdomen soft, non-tender and normal bowel sounds (+) Mid abdominal hernia  Musculoskeletal:no cyanosis of digits and no clubbing  NEURO: alert & oriented x 3 with fluent speech, no focal motor/sensory deficits  LABORATORY DATA:  I have reviewed the data as listed CBC Latest Ref Rng &  Units 07/26/2018 07/27/2017 05/03/2017  WBC 4.0 - 10.5 K/uL 6.4 7.2 14.6(H)  Hemoglobin 12.0 - 15.0 g/dL 12.2 12.5 10.6(L)  Hematocrit 36.0 - 46.0 % 39.0 38.3 33.0(L)  Platelets 150 - 400 K/uL 208 218 251     CMP Latest Ref Rng & Units 07/26/2018 07/27/2017 05/03/2017  Glucose 70 - 99 mg/dL 135(H) 136 169(H)  BUN 8 - 23 mg/dL _0 Creatinine 0.44 - 1.00 mg/dL 0.79 0.81 0.67  Sodium 135 - 145 mmol/L 139 139 138  Potassium 3.5 - 5.1 mmol/L 4.2 4.5 4.0  Chloride 98 - 111 mmol/L 105 106 106  CO2 22 - 32 mmol/L _1 Calcium 8.9 - 10.3 mg/dL 8.8(L) 9.3 8.2(L)  Total Protein 6.5 - 8.1 g/dL 7.2 7.2 -  Total Bilirubin 0.3 - 1.2 mg/dL 0.4 0.4 -  Alkaline Phos 38 - 126 U/L 177(H) 190(H) -  AST 15 - 41 U/L 16 23 -  ALT 0 - 44 U/L 19 29 -      RADIOGRAPHIC STUDIES: I have personally reviewed the radiological images as listed and agreed with the  findings in the report. No results found.   ASSESSMENT & PLAN:  Jessica Farrell is a 74 y.o. female with   1. Well differentiated low-grade neuroendocrine tumor of terminal ileum (carcinoid tumor), pT3 N1 M0, stage IIIB -She was diagnosed in 01/2014. Her tumor has been completely resected. Surgical margins were negative. -There is no role of adjuvant chemotherapy for resected carcinoid tumor.  -We previously discussed the risks of tumor recurrence in the future, including late recurrence. -Her postop chromogranin A and urine 5 HIAA level were normal. Those were not checked before surgery. -She is clinically doing well. Her diarrhea has improved and well controlled with BMs 2-3 times a day mostly after meals. Her physical exam was unremarkable except mid abdominal hernia. There is clinical concern of recurrence. -Last weeks labs reviewed, CBC, CMP and chromogranin A WNL except BG 135, Ca 8.8, Alk phos 177. Her 24 hour urine 5-HIAA is still pending.  -I encouraged her to take calcium supplement daily.  -It has been about 4.5 years from diagnosis. We will repeat CT every 2 years for a few more times. After 5 years we will follow up yearly for a total of 10 years.  -She has not had surveillance colonoscopy since diagnosis. I will refer her to a GI.  -Continue clinical surveillance. F/u in 1 year with CT AP scan. She will hold metformin with scan.  -I encouraged her to contact clinic for any concerns symptoms of recurrence such ad worsening diarrhea, flushing and persistent significant abdominal pain.    2. HTN, DM, hypothyroidism, mild neuropathy  -She will continue medication and follow-up with her primary care physician. -Her TSH levels have been elevated. Her PCP increased her Synthroid to 154mg -BP well controlled, normal. Hg A1c in 11/2017 shows 6.9.  -She has mild tingling of LE from DM. She rarely uses Gabapentin as needed.  -I strongly encouraged her to f/u with PCP to continue  management   3. Cholecystitis status post colectomy in March 2019   4. Cancer screenings -03/2018 Mammogram unremarkable. 03/2018 DEXA shows osteopenia (lowest T-score -1.7 at left femur neck).    Plan:  -She is clinically doing well  -F/u in 1 year with CT AP W Contrast and labs 1 week before OV  [place]     No problem-specific Assessment & Plan  notes found for this encounter.   Orders Placed This Encounter  Procedures  . CT Abdomen Pelvis W Contrast    Standing Status:   Future    Standing Expiration Date:   08/02/2019    Order Specific Question:   If indicated for the ordered procedure, I authorize the administration of contrast media per Radiology protocol    Answer:   Yes    Order Specific Question:   Preferred imaging location?    Answer:   [facility]    Order Specific Question:   Is Oral Contrast requested for this exam?    Answer:   Yes, Per Radiology protocol    Order Specific Question:   Radiology Contrast Protocol - do NOT remove file path    Answer:   _0 [email or web address]   All questions were answered. The patient knows to call the clinic with any problems, questions or concerns. No barriers to learning was detected. I spent 15 minutes counseling the patient face to face. The total time spent in the appointment was 20 minutes and more than 50% was on counseling and review of test results     Truitt Merle, MD 08/02/2018   I, Joslyn Devon, am acting as scribe for Truitt Merle, MD.   I have reviewed the above documentation for accuracy and completeness, and I agree with the above.

## 2018-08-02 ENCOUNTER — Encounter: Payer: Self-pay | Admitting: Hematology

## 2018-08-02 ENCOUNTER — Other Ambulatory Visit: Payer: Self-pay

## 2018-08-02 ENCOUNTER — Inpatient Hospital Stay (HOSPITAL_BASED_OUTPATIENT_CLINIC_OR_DEPARTMENT_OTHER): Payer: Medicare HMO | Admitting: Hematology

## 2018-08-02 VITALS — BP 140/86 | HR 83 | Temp 98.5°F | Resp 18 | Ht 65.0 in | Wt 182.9 lb

## 2018-08-02 DIAGNOSIS — Z9049 Acquired absence of other specified parts of digestive tract: Secondary | ICD-10-CM

## 2018-08-02 DIAGNOSIS — G629 Polyneuropathy, unspecified: Secondary | ICD-10-CM

## 2018-08-02 DIAGNOSIS — E039 Hypothyroidism, unspecified: Secondary | ICD-10-CM | POA: Diagnosis not present

## 2018-08-02 DIAGNOSIS — Z85060 Personal history of malignant carcinoid tumor of small intestine: Secondary | ICD-10-CM

## 2018-08-02 DIAGNOSIS — E119 Type 2 diabetes mellitus without complications: Secondary | ICD-10-CM | POA: Diagnosis not present

## 2018-08-02 DIAGNOSIS — I10 Essential (primary) hypertension: Secondary | ICD-10-CM | POA: Diagnosis not present

## 2018-08-02 DIAGNOSIS — C7B8 Other secondary neuroendocrine tumors: Secondary | ICD-10-CM

## 2018-08-02 DIAGNOSIS — C7880 Secondary malignant neoplasm of unspecified digestive organ: Secondary | ICD-10-CM | POA: Diagnosis not present

## 2018-08-04 LAB — 5 HIAA, QUANTITATIVE, URINE, 24 HOUR
5-HIAA, Ur: 6 mg/L
5-HIAA,Quant.,24 Hr Urine: 3.6 mg/24 hr (ref 0.0–14.9)
Total Volume: 600

## 2018-08-05 ENCOUNTER — Telehealth: Payer: Self-pay | Admitting: Hematology

## 2018-08-05 NOTE — Telephone Encounter (Signed)
Scheduled appt per 6/12 los. A calendar will be mailed out.

## 2018-08-06 ENCOUNTER — Telehealth: Payer: Self-pay | Admitting: *Deleted

## 2018-08-06 NOTE — Telephone Encounter (Signed)
Spoke with pt and informed her of 24 hr urine test result.  Pt voiced understanding.

## 2018-08-06 NOTE — Telephone Encounter (Signed)
-----   Message from Truitt Merle, MD sent at 08/06/2018  9:55 AM EDT ----- Please let pt know her 24h urine test was normal, no concerns, thanks   Truitt Merle  08/06/2018

## 2018-08-06 NOTE — Telephone Encounter (Signed)
Called pt and left message requesting a call back to nurse for results of 24 hr urine test.

## 2018-08-14 ENCOUNTER — Other Ambulatory Visit: Payer: Self-pay | Admitting: Internal Medicine

## 2018-08-31 ENCOUNTER — Other Ambulatory Visit: Payer: Self-pay | Admitting: Internal Medicine

## 2018-10-07 ENCOUNTER — Other Ambulatory Visit: Payer: Self-pay | Admitting: Internal Medicine

## 2018-11-11 ENCOUNTER — Other Ambulatory Visit: Payer: Self-pay | Admitting: Internal Medicine

## 2018-11-20 ENCOUNTER — Other Ambulatory Visit: Payer: Self-pay | Admitting: Internal Medicine

## 2018-11-20 DIAGNOSIS — E1165 Type 2 diabetes mellitus with hyperglycemia: Secondary | ICD-10-CM

## 2019-01-03 ENCOUNTER — Other Ambulatory Visit: Payer: Self-pay | Admitting: Internal Medicine

## 2019-01-17 ENCOUNTER — Other Ambulatory Visit: Payer: Self-pay | Admitting: Internal Medicine

## 2019-02-25 ENCOUNTER — Other Ambulatory Visit: Payer: Self-pay

## 2019-02-25 ENCOUNTER — Ambulatory Visit (INDEPENDENT_AMBULATORY_CARE_PROVIDER_SITE_OTHER): Payer: Medicare HMO | Admitting: Internal Medicine

## 2019-02-25 ENCOUNTER — Ambulatory Visit (INDEPENDENT_AMBULATORY_CARE_PROVIDER_SITE_OTHER): Payer: Medicare HMO

## 2019-02-25 ENCOUNTER — Encounter: Payer: Self-pay | Admitting: Internal Medicine

## 2019-02-25 VITALS — BP 132/80 | HR 83 | Temp 97.8°F | Resp 16 | Ht 65.0 in | Wt 190.2 lb

## 2019-02-25 DIAGNOSIS — K047 Periapical abscess without sinus: Secondary | ICD-10-CM | POA: Diagnosis not present

## 2019-02-25 DIAGNOSIS — L03211 Cellulitis of face: Secondary | ICD-10-CM

## 2019-02-25 DIAGNOSIS — K056 Periodontal disease, unspecified: Secondary | ICD-10-CM | POA: Diagnosis not present

## 2019-02-25 DIAGNOSIS — K122 Cellulitis and abscess of mouth: Secondary | ICD-10-CM

## 2019-02-25 DIAGNOSIS — Z23 Encounter for immunization: Secondary | ICD-10-CM | POA: Diagnosis not present

## 2019-02-25 DIAGNOSIS — K0889 Other specified disorders of teeth and supporting structures: Secondary | ICD-10-CM | POA: Diagnosis not present

## 2019-02-25 IMAGING — DX DG MANDIBLE 4+V
5 series · 5 of 5 positions shown · non-contrast
Comparison: None.

CLINICAL DATA: Left lower toothache and left jaw swelling, 3 days
duration.

EXAM:
MANDIBLE - 4+ VIEW

[mandible pa]
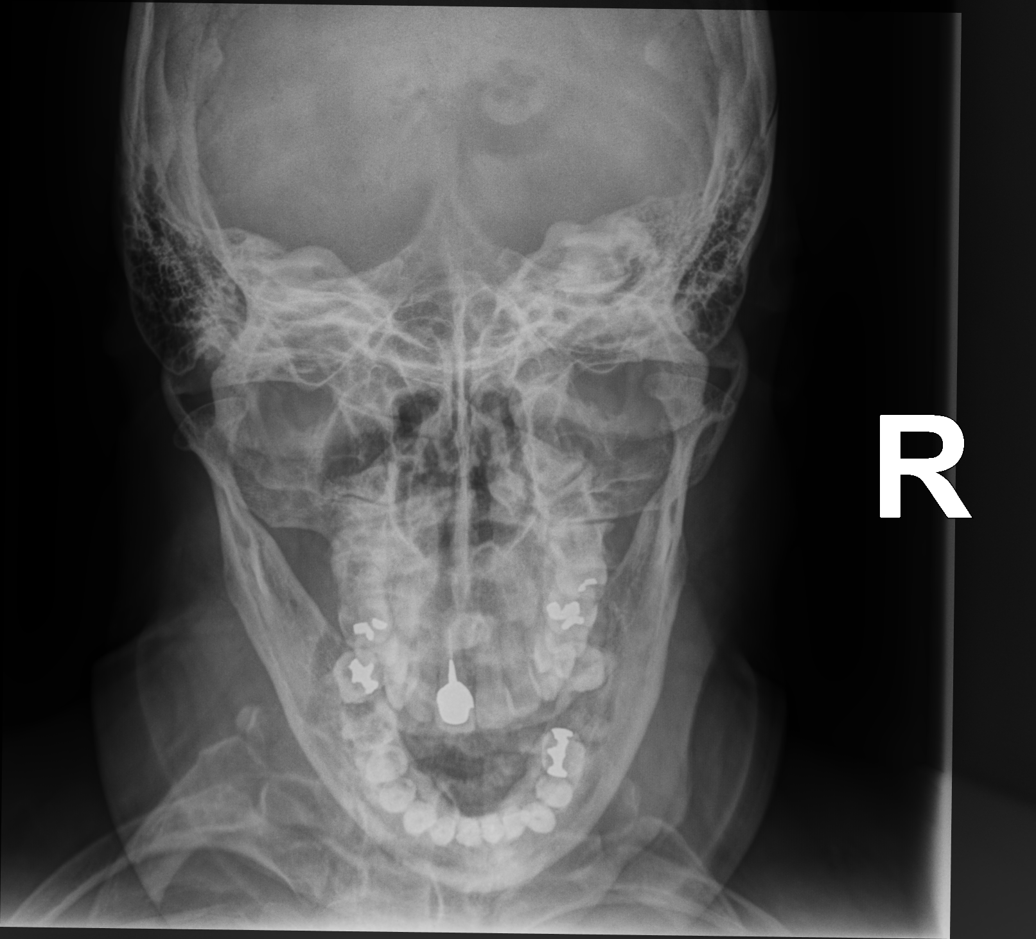

[mandible oblique mlo (1 of 2)]
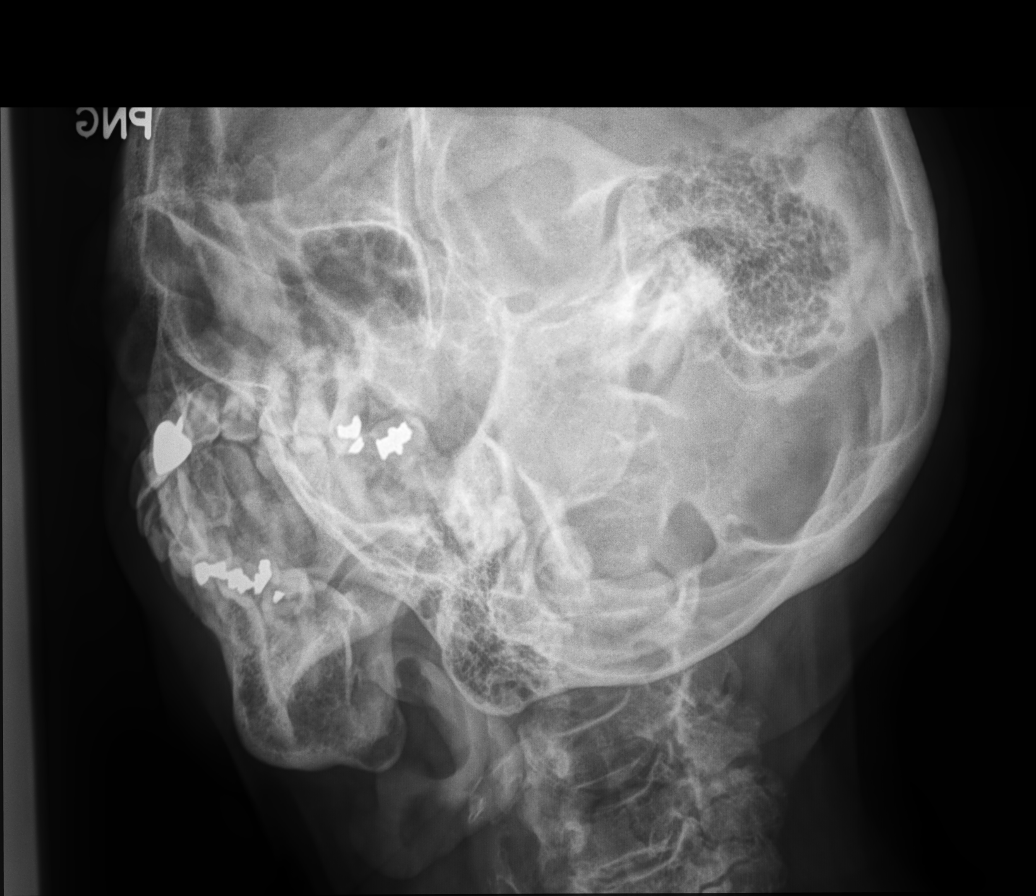

[mandible oblique mlo (2 of 2)]
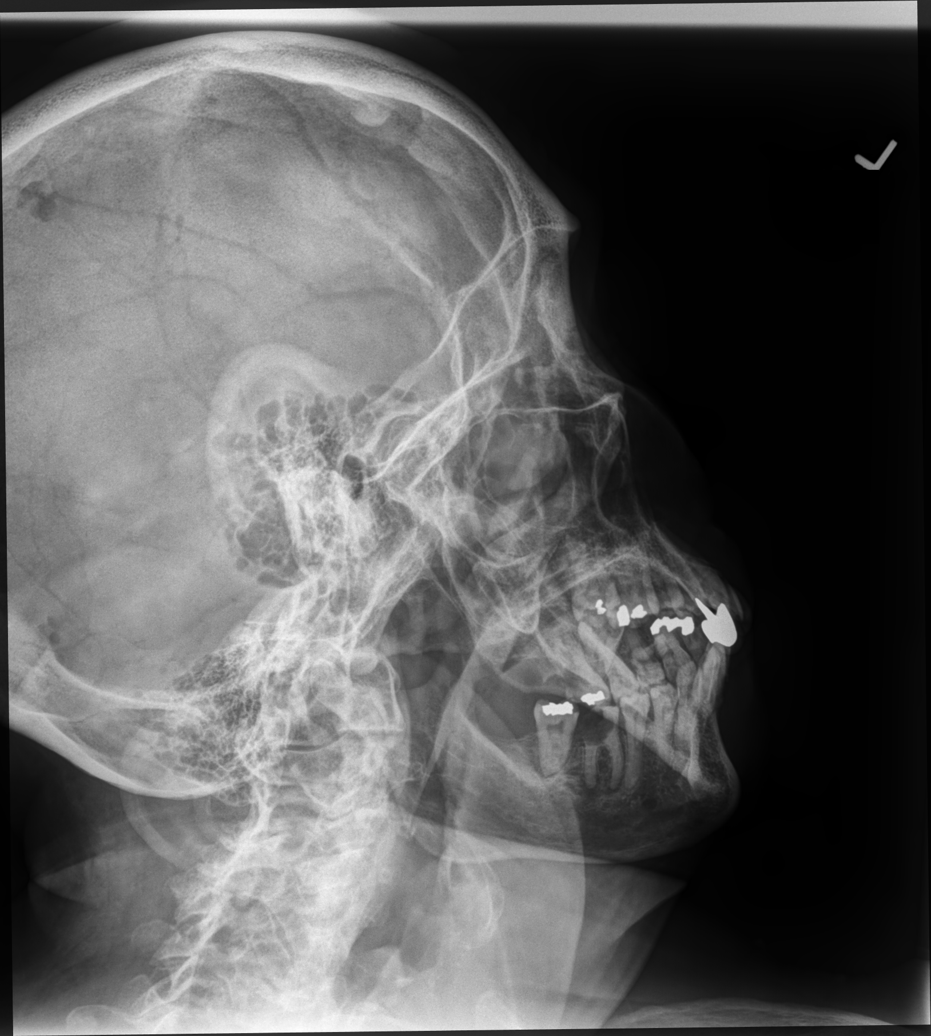

[skull axial [person_name] 30° ap (1 of 2)]
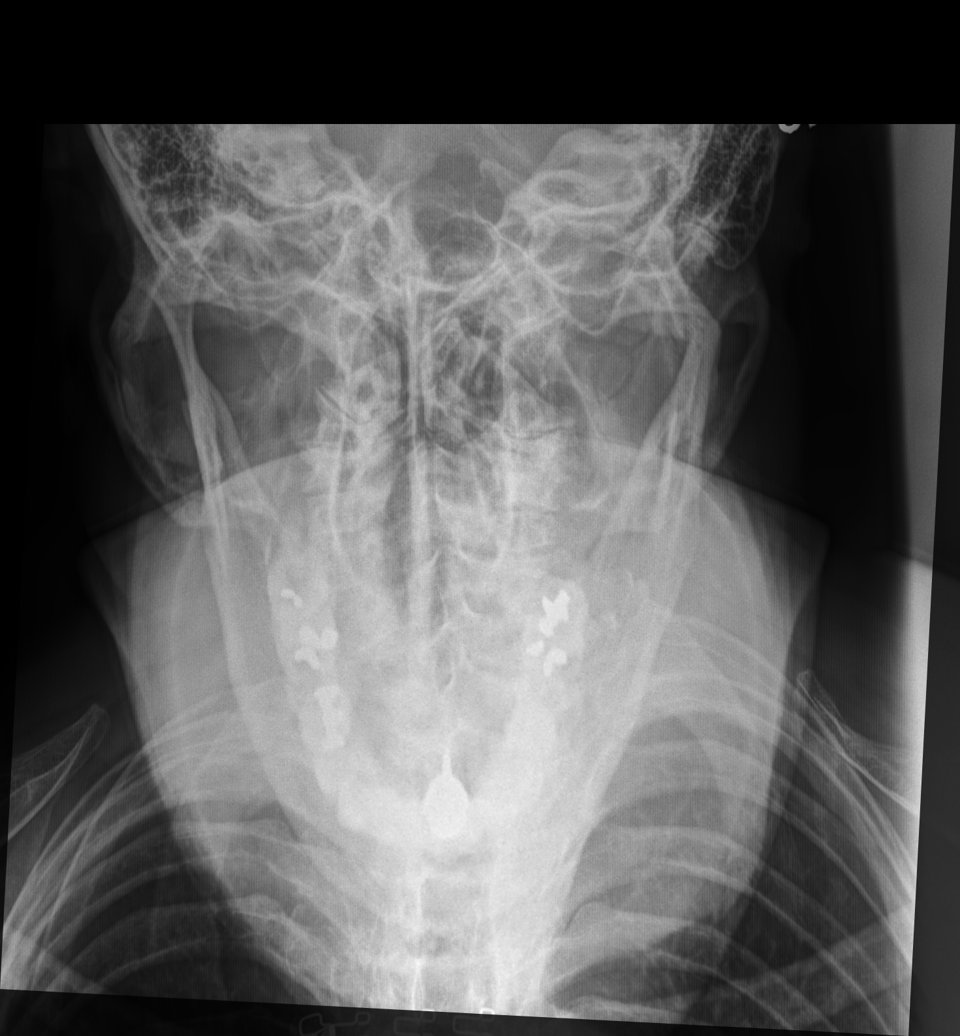

[skull axial [person_name] 30° ap (2 of 2)]
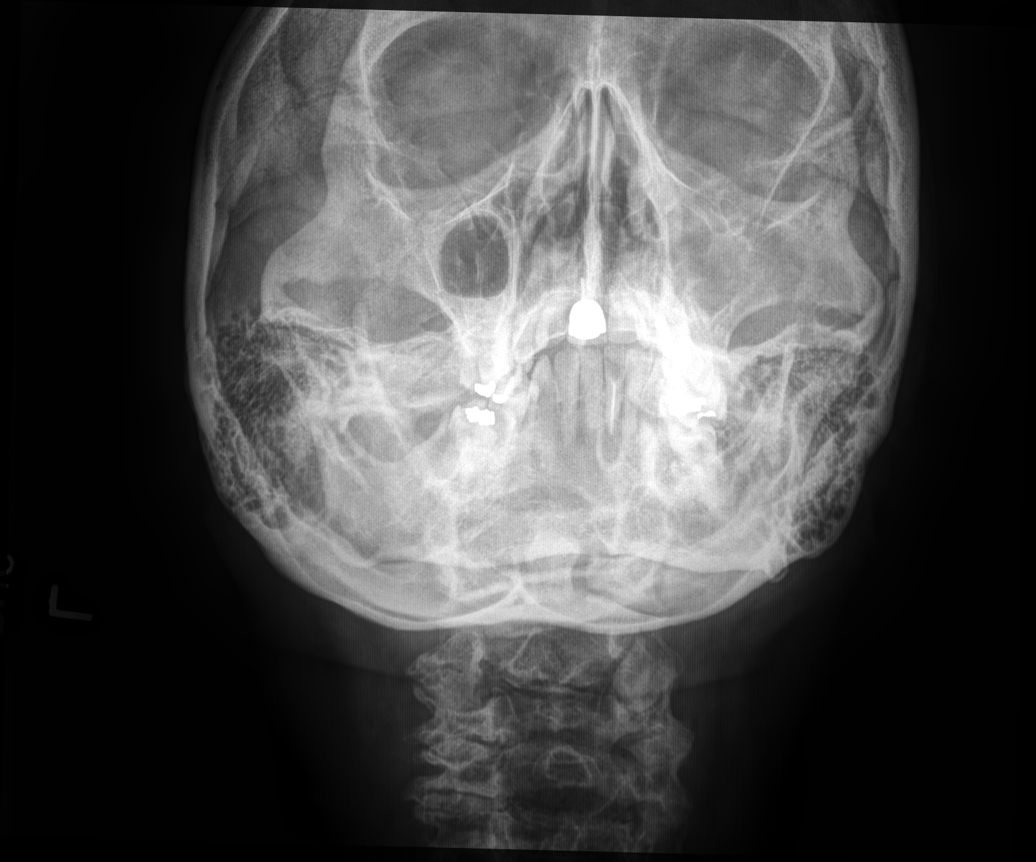

[5 of 5 positions shown; findings below may reference images not displayed]

FINDINGS: Advanced dental decay left mandibular molar tooth 19. Periodontal
disease of left mandibular molar teeth 18 and 19. No other acute
dental pathology seen. Opacification of the left maxillary sinus.
Small fluid level in the right maxillary sinus.
IMPRESSION: Left mandibular dental and periodontal disease affecting the molar
teeth as described above.

Maxillary sinus inflammatory changes.

## 2019-02-25 MED ORDER — OXYCODONE-ACETAMINOPHEN 7.5-325 MG PO TABS: 1.0000 | ORAL_TABLET | Freq: Four times a day (QID) | ORAL | 0 refills | Status: AC | PRN

## 2019-02-25 MED ORDER — CLINDAMYCIN HCL 300 MG PO CAPS: 300.0000 mg | ORAL_CAPSULE | Freq: Three times a day (TID) | ORAL | 0 refills | Status: AC

## 2019-02-25 MED ORDER — PROMETHAZINE HCL 12.5 MG PO TABS: 12.5000 mg | ORAL_TABLET | Freq: Four times a day (QID) | ORAL | 0 refills | Status: DC | PRN

## 2019-02-25 NOTE — Patient Instructions (Signed)
Dental Abscess  A dental abscess is a collection of pus in or around a tooth that results from an infection. An abscess can cause pain in the affected area as well as other symptoms. Treatment is important to help with symptoms and to prevent the infection from spreading. What are the causes? This condition is caused by a bacterial infection around the root of the tooth that involves the inner part of the tooth (pulp). It may result from:  Severe tooth decay.  Trauma to the tooth, such as a broken or chipped tooth, that allows bacteria to enter into the pulp.  Severe gum disease around a tooth. What increases the risk? This condition is more likely to develop in males. It is also more likely to develop in people who:  Have dental decay (cavities).  Eat sugary snacks between meals.  Use tobacco products.  Have diabetes.  Have a weakened disease-fighting system (immune system).  Do not brush and care for their teeth regularly. What are the signs or symptoms? Symptoms of this condition include:  Severe pain in and around the infected tooth.  Swelling and redness around the infected tooth, in the mouth, or in the face.  Tenderness.  Pus drainage.  Bad breath.  Bitter taste in the mouth.  Difficulty swallowing.  Difficulty opening the mouth.  Nausea.  Vomiting.  Chills.  Swollen neck glands.  Fever. How is this diagnosed? This condition is diagnosed based on:  Your symptoms and your medical and dental history.  An examination of the infected tooth. During the exam, your dentist may tap on the infected tooth. You may also have X-rays of the affected area. How is this treated? This condition is treated by getting rid of the infection. This may be done with:  Incision and drainage. This procedure is done by making an incision in the abscess to drain out the pus. Removing pus is the first priority in treating an abscess.  Antibiotic medicines. These may be used  in certain situations.  Antibacterial mouth rinse.  A root canal. This may be performed to save the tooth. Your dentist accesses the visible part of your tooth (crown) with a drill and removes any damaged pulp. Then the space is filled and sealed off.  Tooth extraction. The tooth is pulled out if it cannot be saved by other treatment. You may also receive treatment for pain, such as:  Acetaminophen or NSAIDs.  Gels that contain a numbing medicine.  An injection to block the pain near your nerve. Follow these instructions at home: Medicines  Take over-the-counter and prescription medicines only as told by your dentist.  If you were prescribed an antibiotic, take it as told by your dentist. Do not stop taking the antibiotic even if you start to feel better.  If you were prescribed a gel that contains a numbing medicine, use it exactly as told in the directions. Do not use these gels for children who are younger than 1 years of age.  Do not drive or use heavy machinery while taking prescription pain medicine. General instructions  Rinse out your mouth often with salt water to relieve pain or swelling. To make a salt-water mixture, completely dissolve -1 tsp of salt in 1 cup of warm water.  Eat a soft diet while your abscess is healing.  Drink enough fluid to keep your urine pale yellow.  Do not apply heat to the outside of your mouth.  Do not use any products that contain nicotine or  tobacco, such as cigarettes and e-cigarettes. If you need help quitting, ask your health care provider.  Keep all follow-up visits as told by your dentist. This is important. How is this prevented?  Brush your teeth every morning and night with fluoride toothpaste. Floss one time each day.  Get regularly scheduled dental cleanings.  Consider having a dental sealant applied on teeth that have deep holes (caries).  Drink fluoridated water regularly. This includes most tap water. Check the label  on bottled water to see if it contains fluoride.  Drink water instead of sugary drinks.  Eat healthy meals and snacks.  Wear a mouth guard or face shield to protect your teeth while playing sports. Contact a health care provider if:  Your pain is worse and is not helped by medicine. Get help right away if:  You have a fever or chills.  Your symptoms suddenly get worse.  You have a very bad headache.  You have problems breathing or swallowing.  You have trouble opening your mouth.  You have swelling in your neck or around your eye. Summary  A dental abscess is a collection of pus in or around a tooth that results from an infection.  A dental abscess may result from severe tooth decay, trauma to the tooth, or severe gum disease around a tooth.  Symptoms include severe pain, swelling, redness, and drainage of pus in and around the infected tooth.  The first priority in treating a dental abscess is to drain out the pus. Treatment may also involve removing damage inside the tooth (root canal) or pulling out (extracting) the tooth. This information is not intended to replace advice given to you by your health care provider. Make sure you discuss any questions you have with your health care provider. Document Revised: 01/19/2017 Document Reviewed: 10/09/2016 Elsevier Patient Education  2020 [employer]  

## 2019-02-25 NOTE — Progress Notes (Signed)
Subjective:  Patient ID: Jessica Farrell, female    DOB: 1945/11/24  Age: 74 y.o. MRN: [id number]  CC: Facial Swelling   HPI [name] presents for concerns about painful swelling over her left lower face/jaw.  She developed a left lower posterior toothache about 5 days ago and over the last day or 2 the swelling has developed.  She denies headache, nausea, vomiting, fever, chills, trouble breathing or swelling.  She tells me that Aleve is not controlling the pain.  Outpatient Medications Prior to Visit  Medication Sig Dispense Refill  . albuterol (PROVENTIL HFA;VENTOLIN HFA) 108 (90 Base) MCG/ACT inhaler Inhale 2 puffs into the lungs every 6 (six) hours as needed for wheezing or shortness of breath. 1 Inhaler 8  . atorvastatin (LIPITOR) 10 MG tablet TAKE 1 TABLET EVERY DAY 90 tablet 0  . Bismuth Subsalicylate (PEPTO-BISMOL PO) Take 2 tablets by mouth 2 (two) times daily as needed [facility] PAIN).    . ciclopirox (PENLAC) 8 % solution Apply topically at bedtime. Apply over nail & surrounding skin. Apply daily over previous coat. After 7 days, may remove w alcohol &continue 6.6 mL 5  . gabapentin (NEURONTIN) 100 MG capsule Take 1 capsule (100 mg total) by mouth at bedtime. 30 capsule 5  . GuaiFENesin (MUCINEX PO) Take 400 mg by mouth daily as needed for congestion.    Marland Kitchen levothyroxine (SYNTHROID) 137 MCG tablet Take 1 tablet (137 mcg total) by mouth daily before breakfast. Needs office visit with labs. 90 tablet 0  . metFORMIN (GLUCOPHAGE) 500 MG tablet TAKE 1 TABLET TWICE DAILY WITH A MEAL 180 tablet 1  . montelukast (SINGULAIR) 10 MG tablet Take 1 tablet (10 mg total) by mouth at bedtime. 90 tablet 1  . Multiple Vitamins-Minerals (CENTRUM SILVER PO) Take 1 tablet by mouth daily.    Marland Kitchen Phenylephrine HCl (SINEX REGULAR NA) Place 1 spray into the nose daily as needed (ALLERGIES).    Marland Kitchen terbinafine (LAMISIL AT) 1 % cream Apply 1 application topically 2 (two) times daily. 42 g 2  .  umeclidinium-vilanterol (ANORO ELLIPTA) 62.5-25 MCG/INH AEPB Inhale 1 puff daily into the lungs. 60 each 5  . blood glucose meter kit and supplies KIT Dispense based on patient and insurance preference. Use up to 2 times daily as directed. DX code: E11.9 (Patient not taking: Reported on 02/25/2019) 1 each 0  . glucose blood test strip Based on insurance preference. Use as instructed once daily to check sugars. Dx E11.9 (Patient not taking: Reported on 02/25/2019) 100 each 12  . Lancets (ONETOUCH ULTRASOFT) lancets Use as instructed once daily to check sugars. Dx E11.9 (Patient not taking: Reported on 02/25/2019) 100 each 12   No facility-administered medications prior to visit.    ROS Review of Systems  Constitutional: Negative for chills, diaphoresis, fatigue and fever.  HENT: Positive for dental problem and facial swelling. Negative for drooling, ear pain, sinus pressure, sore throat, trouble swallowing and voice change.   Eyes: Negative.   Respiratory: Negative for cough, chest tightness, shortness of breath and wheezing.   Cardiovascular: Negative for chest pain, palpitations and leg swelling.  Gastrointestinal: Negative for abdominal pain, diarrhea, nausea and vomiting.  Endocrine: Negative.   Genitourinary: Negative.  Negative for decreased urine volume and urgency.  Musculoskeletal: Negative.   Skin: Negative.   Neurological: Negative.  Negative for dizziness, weakness and light-headedness.  Hematological: Negative.   Psychiatric/Behavioral: Negative.     Objective:  BP 132/80 (BP Location: Left Arm, Patient  Position: Sitting, Cuff Size: Large)   Pulse 83   Temp 97.8 F (36.6 C) (Oral)   Resp 16   Ht _0  (1.651 m)   Wt 190 lb 4 oz (86.3 kg)   SpO2 97%   BMI 31.66 kg/m   BP Readings from Last 3 Encounters:  02/25/19 132/80  08/02/18 140/86  11/21/17 112/76    Wt Readings from Last 3 Encounters:  02/25/19 190 lb 4 oz (86.3 kg)  08/02/18 182 lb 14.4 oz (83 kg)  11/21/17  179 lb 3.2 oz (81.3 kg)    Physical Exam Vitals reviewed.  Constitutional:      General: She is not in acute distress.    Appearance: She is not ill-appearing, toxic-appearing or diaphoretic.  HENT:     Head:     Jaw: Tenderness and swelling present. No trismus or pain on movement.      Nose: Nose normal.     Mouth/Throat:     Mouth: Mucous membranes are moist.     Dentition: Abnormal dentition. Dental tenderness, dental caries and dental abscesses present. No gingival swelling or gum lesions.     Comments: Left femoral molar, tooth #18, is crooked and has surrounding erythema and swelling.  The floor the mouth is not elevated.  Overall she has extremely poor dentition. Eyes:     General: No scleral icterus.    Conjunctiva/sclera: Conjunctivae normal.  Cardiovascular:     Rate and Rhythm: Normal rate and regular rhythm.     Heart sounds: No murmur.  Pulmonary:     Effort: Pulmonary effort is normal.     Breath sounds: No stridor. No wheezing, rhonchi or rales.  Abdominal:     General: Abdomen is flat.     Palpations: There is no mass.     Tenderness: There is no abdominal tenderness.  Musculoskeletal:     Cervical back: Normal range of motion.  Lymphadenopathy:     Cervical: No cervical adenopathy.  Skin:    General: Skin is warm and dry.     Findings: No rash.  Neurological:     General: No focal deficit present.     Mental Status: She is alert.  Psychiatric:        Mood and Affect: Mood normal.        Behavior: Behavior normal.     Lab Results  Component Value Date   WBC 6.4 07/26/2018   HGB 12.2 07/26/2018   HCT 39.0 07/26/2018   PLT 208 07/26/2018   GLUCOSE 135 (H) 07/26/2018   CHOL 133 07/20/2016   TRIG 204 (H) 07/20/2016   HDL 37 (L) 07/20/2016   LDLCALC 55 07/20/2016   ALT 19 07/26/2018   AST 16 07/26/2018   NA 139 07/26/2018   K 4.2 07/26/2018   CL 105 07/26/2018   CREATININE 0.79 07/26/2018   BUN 13 07/26/2018   CO2 25 07/26/2018   TSH 0.80  11/06/2017   INR 1.04 02/03/2014   HGBA1C 6.9 (A) 11/21/2017   MICROALBUR <0.7 09/15/2015    DG Bone Density  Result Date: 03/25/2018 EXAM: DUAL X-RAY ABSORPTIOMETRY (DXA) FOR BONE MINERAL DENSITY IMPRESSION: Referring Physician:  [place] Your patient completed a BMD test using Lunar IDXA DXA system ( analysis version: 16 ) manufactured by [employer]. Technologist: KT PATIENT: Name: Leeasia, Secrist Patient ID: [id number] Birth Date: Jul 12, 1945 Height: 63.2 in. Sex: Female Measured: 03/25/2018 Weight: 171.0 lbs. Indications: Advanced Age, Caucasian, Estrogen Deficient, Gabapentin, Height Loss (781.91), History  of Fracture (Adult) (V15.51), Hypothyroid, Postmenopausal, Synthroid, Secondary Osteoporosis Fractures: Ankle Treatments: Multivitamin ASSESSMENT: The BMD measured at Femur Neck Left is 0.802 g/cm2 with a T-score of -1.7. This patient is considered OSTEOPENIC according to [facility]) criteria. The scan quality is good. Site Region Measured Date Measured Age YA T-score BMD Significant CHANGE DualFemur Neck Left  03/25/2018    72.8         -1.7    0.802 g/cm2 AP Spine  L1-L4      03/25/2018    72.8         -0.4    1.128 g/cm2 DualFemur Total Mean 3/25/2018    72.8         -0.8    0.902 g/cm2 World Health Organization [facility]) criteria for post-menopausal, Caucasian Women: Normal       T-score at or above -1 SD Osteopenia   T-score between -1 and -2.5 SD Osteoporosis T-score at or below -2.5 SD RECOMMENDATION: 1. All patients should optimize calcium and vitamin D intake. 2. Consider FDA approved medical therapies in postmenopausal women and men aged 41 years and older, based on the following: a. A hip or vertebral (clinical or morphometric) fracture b. T- score < or = -2.5 at the femoral neck or spine after appropriate evaluation to exclude secondary causes c. Low bone mass (T-score between -1.0 and -2.5 at the femoral neck or spine) and a 10 year probability of a hip fracture >  or = 3% or a 10 year probability of a major osteoporosis-related fracture > or = 20% based on the [place]-adapted WHO algorithm d. Clinician judgment and/or patient preferences may indicate treatment for people with 10-year fracture probabilities above or below these levels FOLLOW-UP: People with diagnosed cases of osteoporosis or at high risk for fracture should have regular bone mineral density tests. For patients eligible for Medicare, routine testing is allowed once every 2 years. The testing frequency can be increased to one year for patients who have rapidly progressing disease, those who are receiving or discontinuing medical therapy to restore bone mass, or have additional risk factors. I have reviewed this report and agree with the above findings. Mark A. Thornton Papas, M.D.  Radiology FRAX* 10-year Probability of Fracture Based on femoral neck BMD: DualFemur (Left) Major Osteoporotic Fracture: 17.1% Hip Fracture:                3.1% Population:                  [place] (Caucasian) Risk Factors: History of Fracture (Adult) (V15.51), Secondary Osteoporosis *FRAX is a Materials engineer of the [employer] for Metabolic Bone Disease, a World Pharmacologist (WHO) [facility]. The probability of a major osteoporotic fracture is 17.1 % within the next ten years. The probability of hip fracture is  3.1  % within the next 10 years. I have reviewed this report and agree with the above findings. Mark A. Thornton Papas, M.D. [facility] Radiology Electronically Signed   By: Lavonia Dana M.D.   On: 03/25/2018 12:37   MM 3D SCREEN BREAST BILATERAL  Result Date: 03/25/2018 CLINICAL DATA:  Screening. EXAM: DIGITAL SCREENING BILATERAL MAMMOGRAM WITH TOMO AND CAD COMPARISON:  Previous exam(s). ACR Breast Density Category b: There are scattered areas of fibroglandular density. FINDINGS: There are no findings suspicious for malignancy. Images were processed with CAD. IMPRESSION: No mammographic  evidence of malignancy. A result letter of this screening mammogram will be mailed directly to the  patient. RECOMMENDATION: Screening mammogram in one year. (Code:[id number]-B-01Y) BI-RADS CATEGORY  1: Negative. Electronically Signed   By: Abelardo Diesel M.D.   On: 03/25/2018 16:03   DG Mandible 4 Views  Result Date: 02/25/2019 CLINICAL DATA:  Left lower toothache and left jaw swelling, 3 days duration. EXAM: MANDIBLE - 4+ VIEW COMPARISON:  None. FINDINGS: Advanced dental decay left mandibular molar tooth 19. Periodontal disease of left mandibular molar teeth 18 and 19. No other acute dental pathology seen. Opacification of the left maxillary sinus. Small fluid level in the right maxillary sinus. IMPRESSION: Left mandibular dental and periodontal disease affecting the molar teeth as described above. Maxillary sinus inflammatory changes. Electronically Signed   By: Nelson Chimes M.D.   On: 02/25/2019 11:53    Assessment & Plan:   Chiquetta was seen today for facial swelling.  Diagnoses and all orders for this visit:  Mouth abscess -     DG Mandible 4 Views; Future -     clindamycin (CLEOCIN) 300 MG capsule; Take 1 capsule (300 mg total) by mouth 3 (three) times daily for 10 days. -     Ambulatory referral to Oral Maxillofacial Surgery -     oxyCODONE-acetaminophen (PERCOCET) 7.5-325 MG tablet; Take 1 tablet by mouth every 6 (six) hours as needed for up to 10 days. -     promethazine (PHENERGAN) 12.5 MG tablet; Take 1 tablet (12.5 mg total) by mouth every 6 (six) hours as needed for nausea or vomiting.  Need for influenza vaccination -     Flu Vaccine QUAD High Dose(Fluad)  Cellulitis, face -     DG Mandible 4 Views; Future -     clindamycin (CLEOCIN) 300 MG capsule; Take 1 capsule (300 mg total) by mouth 3 (three) times daily for 10 days.  Dental abscess- Based on her symptoms, exam, and x-ray it looks like she has a molar abscess.  She has a penicillin allergy so will treat the infection with  clindamycin.  Will control the pain with oxycodone and acetaminophen.  If these meds cause nausea and vomiting she will take Phenergan as needed.  I have also asked her to see an oral surgeon to see if the tooth needs to be extracted. -     oxyCODONE-acetaminophen (PERCOCET) 7.5-325 MG tablet; Take 1 tablet by mouth every 6 (six) hours as needed for up to 10 days. -     promethazine (PHENERGAN) 12.5 MG tablet; Take 1 tablet (12.5 mg total) by mouth every 6 (six) hours as needed for nausea or vomiting.  Need for Tdap vaccination -     Tdap vaccine greater than or equal to 7yo IM   I am having Dolores Lory "[name]" start on clindamycin, oxyCODONE-acetaminophen, and promethazine. I am also having her maintain her umeclidinium-vilanterol, montelukast, Bismuth Subsalicylate (PEPTO-BISMOL PO), Multiple Vitamins-Minerals (CENTRUM SILVER PO), Phenylephrine HCl (SINEX REGULAR NA), GuaiFENesin (MUCINEX PO), ciclopirox, terbinafine, gabapentin, onetouch ultrasoft, albuterol, glucose blood, blood glucose meter kit and supplies, metFORMIN, atorvastatin, and levothyroxine.  Meds ordered this encounter  Medications  . clindamycin (CLEOCIN) 300 MG capsule    Sig: Take 1 capsule (300 mg total) by mouth 3 (three) times daily for 10 days.    Dispense:  30 capsule    Refill:  0  . oxyCODONE-acetaminophen (PERCOCET) 7.5-325 MG tablet    Sig: Take 1 tablet by mouth every 6 (six) hours as needed for up to 10 days.    Dispense:  45 tablet    Refill:  0  .  promethazine (PHENERGAN) 12.5 MG tablet    Sig: Take 1 tablet (12.5 mg total) by mouth every 6 (six) hours as needed for nausea or vomiting.    Dispense:  30 tablet    Refill:  0     Follow-up: Return if symptoms worsen or fail to improve.  Scarlette Calico, MD

## 2019-03-03 ENCOUNTER — Other Ambulatory Visit: Payer: Self-pay | Admitting: Internal Medicine

## 2019-03-12 DIAGNOSIS — K029 Dental caries, unspecified: Secondary | ICD-10-CM | POA: Diagnosis not present

## 2019-03-28 ENCOUNTER — Other Ambulatory Visit: Payer: Self-pay | Admitting: Internal Medicine

## 2019-04-07 ENCOUNTER — Other Ambulatory Visit: Payer: Self-pay | Admitting: Internal Medicine

## 2019-04-07 DIAGNOSIS — E1165 Type 2 diabetes mellitus with hyperglycemia: Secondary | ICD-10-CM

## 2019-05-16 ENCOUNTER — Other Ambulatory Visit: Payer: Self-pay | Admitting: Internal Medicine

## 2019-06-11 ENCOUNTER — Other Ambulatory Visit: Payer: Self-pay | Admitting: Internal Medicine

## 2019-07-23 ENCOUNTER — Telehealth: Payer: Self-pay | Admitting: Hematology

## 2019-07-23 NOTE — Telephone Encounter (Signed)
Called pt per 6/2 sch message - no answer - left message for patient to call back for reschedule.

## 2019-07-25 ENCOUNTER — Inpatient Hospital Stay: Payer: Medicare HMO | Attending: Hematology

## 2019-08-01 ENCOUNTER — Other Ambulatory Visit: Payer: Self-pay

## 2019-08-01 ENCOUNTER — Telehealth: Payer: Self-pay

## 2019-08-01 ENCOUNTER — Inpatient Hospital Stay: Payer: Medicare HMO | Admitting: Hematology

## 2019-08-01 DIAGNOSIS — C7B8 Other secondary neuroendocrine tumors: Secondary | ICD-10-CM

## 2019-08-01 NOTE — Telephone Encounter (Signed)
I spoke with Jessica Farrell regarding her appt for today.  She was to have CT scan and then have f/u with Dr. Burr Medico.  The Ct scan was never done.  Today's appt has been canceled.  Ct scan will be reordered.  When PA is complete I will give her the number for imaging scheduling as she is now working.  After Ct scan is scheduled lab appt will be made for the same day prior to Ct scan.  F/u appt will be made after that.  Jessica Kuk verbalized understanding.

## 2019-08-27 ENCOUNTER — Other Ambulatory Visit: Payer: Self-pay | Admitting: Internal Medicine

## 2019-08-27 DIAGNOSIS — E1165 Type 2 diabetes mellitus with hyperglycemia: Secondary | ICD-10-CM

## 2019-09-05 ENCOUNTER — Telehealth: Payer: Self-pay

## 2019-09-05 NOTE — Telephone Encounter (Signed)
I spoke with Jessica Farrell and let her know that her ct scan has been authorized by her insurance company.  I provided her the number for imaging schedulers.  She will let me know when the scan is and we will schedule lab appt for that day.

## 2019-09-09 ENCOUNTER — Other Ambulatory Visit: Payer: Self-pay

## 2019-09-09 ENCOUNTER — Inpatient Hospital Stay: Payer: Medicare HMO | Attending: Hematology

## 2019-09-09 DIAGNOSIS — Z79899 Other long term (current) drug therapy: Secondary | ICD-10-CM | POA: Insufficient documentation

## 2019-09-09 DIAGNOSIS — E039 Hypothyroidism, unspecified: Secondary | ICD-10-CM | POA: Diagnosis not present

## 2019-09-09 DIAGNOSIS — Z9049 Acquired absence of other specified parts of digestive tract: Secondary | ICD-10-CM | POA: Diagnosis not present

## 2019-09-09 DIAGNOSIS — E1140 Type 2 diabetes mellitus with diabetic neuropathy, unspecified: Secondary | ICD-10-CM | POA: Insufficient documentation

## 2019-09-09 DIAGNOSIS — C7B8 Other secondary neuroendocrine tumors: Secondary | ICD-10-CM

## 2019-09-09 DIAGNOSIS — I10 Essential (primary) hypertension: Secondary | ICD-10-CM | POA: Diagnosis not present

## 2019-09-09 DIAGNOSIS — D3A8 Other benign neuroendocrine tumors: Secondary | ICD-10-CM | POA: Insufficient documentation

## 2019-09-09 DIAGNOSIS — J45909 Unspecified asthma, uncomplicated: Secondary | ICD-10-CM | POA: Diagnosis not present

## 2019-09-09 DIAGNOSIS — Z7984 Long term (current) use of oral hypoglycemic drugs: Secondary | ICD-10-CM | POA: Insufficient documentation

## 2019-09-09 DIAGNOSIS — E785 Hyperlipidemia, unspecified: Secondary | ICD-10-CM | POA: Diagnosis not present

## 2019-09-09 LAB — CBC WITH DIFFERENTIAL/PLATELET
Abs Immature Granulocytes: 0.02 10*3/uL (ref 0.00–0.07)
Basophils Absolute: 0.1 10*3/uL (ref 0.0–0.1)
Basophils Relative: 1 %
Eosinophils Absolute: 0.3 10*3/uL (ref 0.0–0.5)
Eosinophils Relative: 5 %
HCT: 38.6 % (ref 36.0–46.0)
Hemoglobin: 12.2 g/dL (ref 12.0–15.0)
Immature Granulocytes: 0 %
Lymphocytes Relative: 30 %
Lymphs Abs: 1.9 10*3/uL (ref 0.7–4.0)
MCH: 29.3 pg (ref 26.0–34.0)
MCHC: 31.6 g/dL (ref 30.0–36.0)
MCV: 92.8 fL (ref 80.0–100.0)
Monocytes Absolute: 0.4 10*3/uL (ref 0.1–1.0)
Monocytes Relative: 6 %
Neutro Abs: 3.7 10*3/uL (ref 1.7–7.7)
Neutrophils Relative %: 58 %
Platelets: 216 10*3/uL (ref 150–400)
RBC: 4.16 MIL/uL (ref 3.87–5.11)
RDW: 12.8 % (ref 11.5–15.5)
WBC: 6.3 10*3/uL (ref 4.0–10.5)
nRBC: 0 % (ref 0.0–0.2)

## 2019-09-09 LAB — COMPREHENSIVE METABOLIC PANEL
ALT: 19 U/L (ref 0–44)
AST: 17 U/L (ref 15–41)
Albumin: 3.5 g/dL (ref 3.5–5.0)
Alkaline Phosphatase: 148 U/L — ABNORMAL HIGH (ref 38–126)
Anion gap: 8 (ref 5–15)
BUN: 17 mg/dL (ref 8–23)
CO2: 25 mmol/L (ref 22–32)
Calcium: 8.9 mg/dL (ref 8.9–10.3)
Chloride: 107 mmol/L (ref 98–111)
Creatinine, Ser: 0.94 mg/dL (ref 0.44–1.00)
GFR calc Af Amer: >60 mL/min (ref >60)
GFR calc non Af Amer: 60 mL/min — ABNORMAL LOW (ref >60)
Glucose, Bld: 156 mg/dL — ABNORMAL HIGH (ref 70–99)
Potassium: 4.4 mmol/L (ref 3.5–5.1)
Sodium: 140 mmol/L (ref 135–145)
Total Bilirubin: 0.3 mg/dL (ref 0.3–1.2)
Total Protein: 7.1 g/dL (ref 6.5–8.1)

## 2019-09-10 LAB — CHROMOGRANIN A: Chromogranin A (ng/mL): 70.7 ng/mL (ref 0.0–101.8)

## 2019-09-11 ENCOUNTER — Other Ambulatory Visit: Payer: Self-pay

## 2019-09-11 ENCOUNTER — Encounter (HOSPITAL_COMMUNITY): Payer: Self-pay

## 2019-09-11 ENCOUNTER — Ambulatory Visit (HOSPITAL_COMMUNITY)
Admission: RE | Admit: 2019-09-11 | Discharge: 2019-09-11 | Disposition: A | Discharge status: Home / Self Care | Payer: Medicare HMO | Source: Ambulatory Visit | Attending: Hematology | Admitting: Hematology

## 2019-09-11 DIAGNOSIS — C7A019 Malignant carcinoid tumor of the small intestine, unspecified portion: Secondary | ICD-10-CM | POA: Diagnosis not present

## 2019-09-11 DIAGNOSIS — C7B8 Other secondary neuroendocrine tumors: Secondary | ICD-10-CM | POA: Insufficient documentation

## 2019-09-11 DIAGNOSIS — Z85030 Personal history of malignant carcinoid tumor of large intestine: Secondary | ICD-10-CM | POA: Insufficient documentation

## 2019-09-11 IMAGING — CT CT ABD-PELV W/ CM
2 of 5 series · 16 of 46 positions shown, 18 images · IV contrast (OMNIPAQUE)
Comparison: [DATE]

CLINICAL DATA: Follow-up small bowel carcinoid tumor, metastatic to
lymph nodes, status post right ileocolectomy and reanastomosis

EXAM:
CT ABDOMEN AND PELVIS WITH CONTRAST
TECHNIQUE: Multidetector CT imaging of the abdomen and pelvis was performed
using the standard protocol following bolus administration of
intravenous contrast.
CONTRAST:  100mL OMNIPAQUE IOHEXOL 300 MG/ML SOLN, additional oral
enteric contrast

[Series 2: axial st · axial · 0.90mm/px · z∈[-486,-106]mm · 13 of 90 slices shown, 15 images]
[im 7/90  soft-tissue]
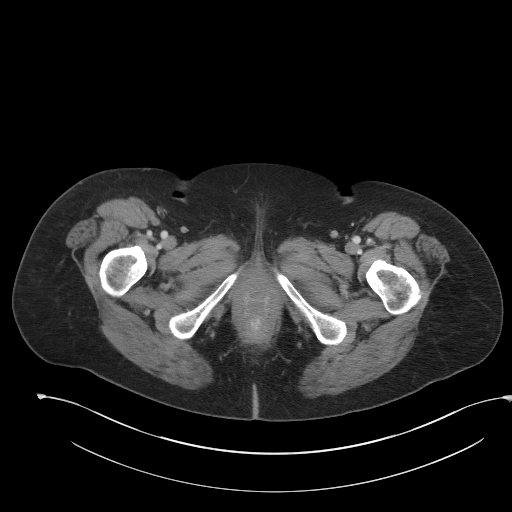
[im 7/90  bone]
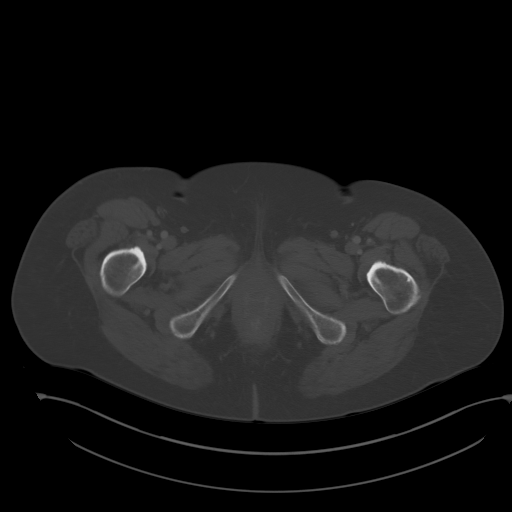
[im 13/90  soft-tissue]
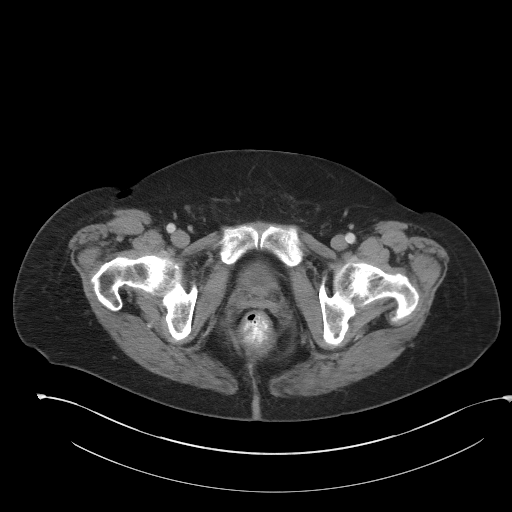
[im 20/90  soft-tissue]
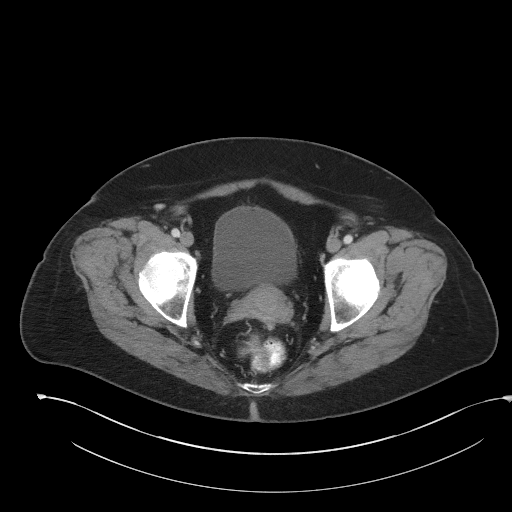
[im 26/90  soft-tissue]
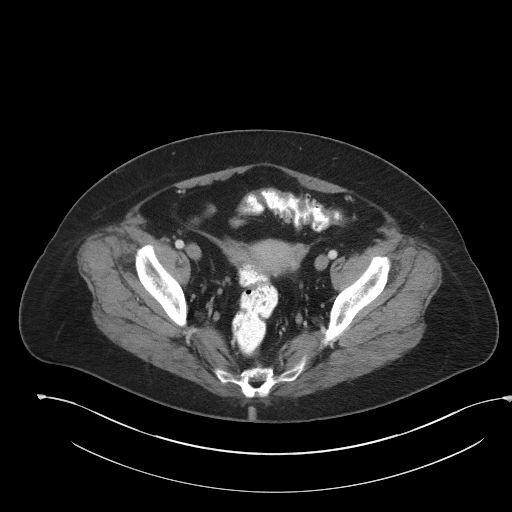
[im 32/90  soft-tissue]
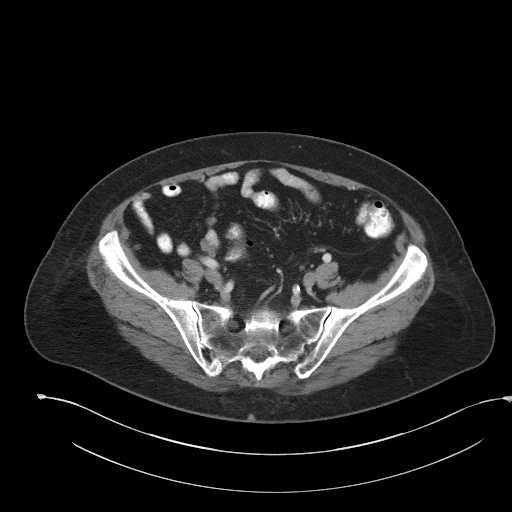
[im 39/90  soft-tissue]
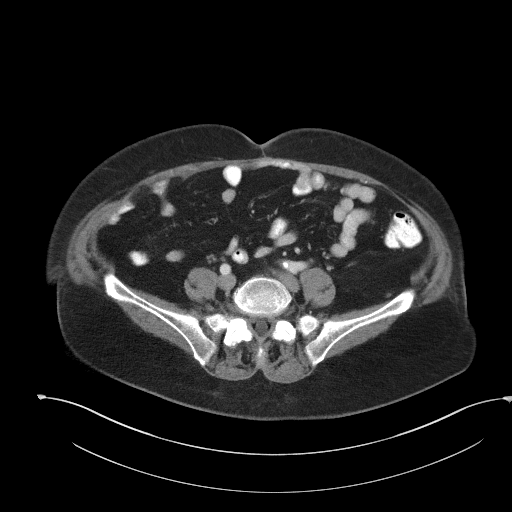
[im 45/90  soft-tissue]
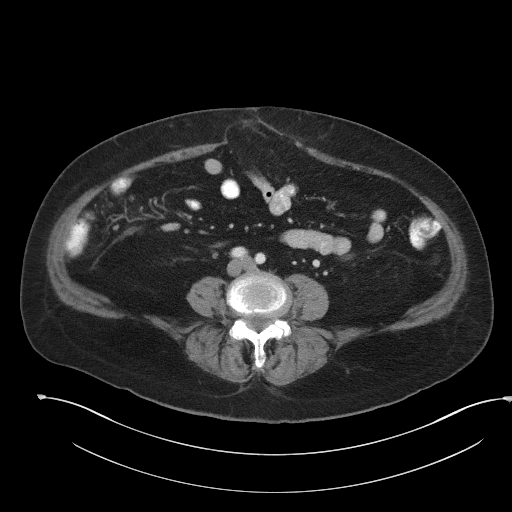
[im 51/90  soft-tissue]
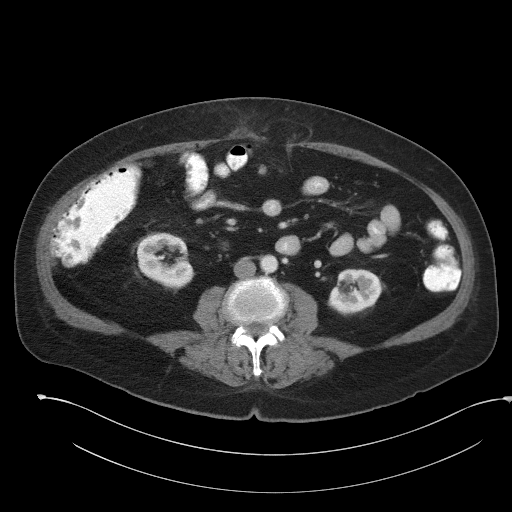
[im 58/90  soft-tissue]
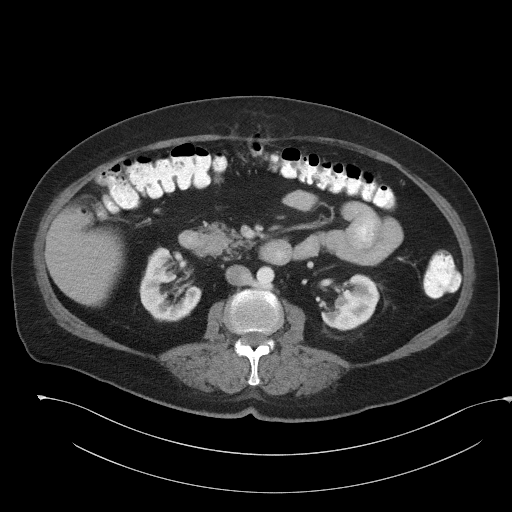
[im 58/90  bone]
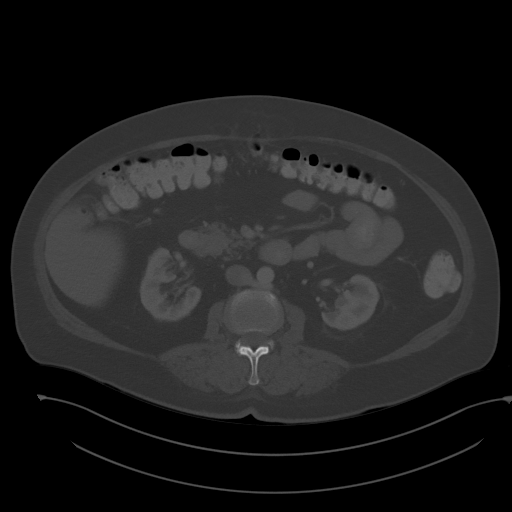
[im 64/90  soft-tissue]
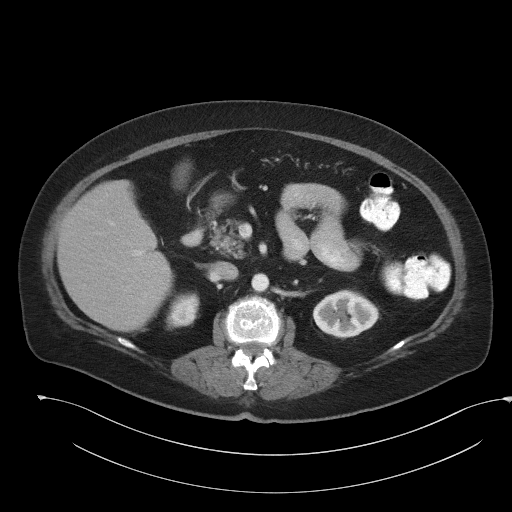
[im 70/90  soft-tissue]
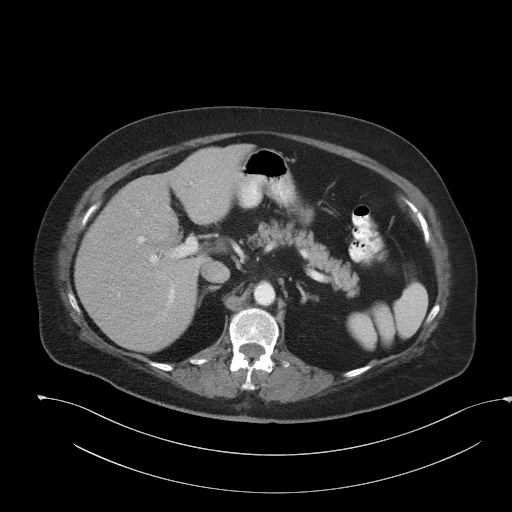
[im 77/90  soft-tissue]
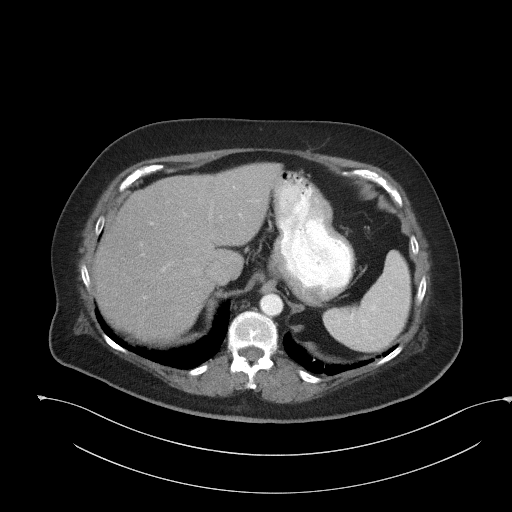
[im 83/90  soft-tissue]
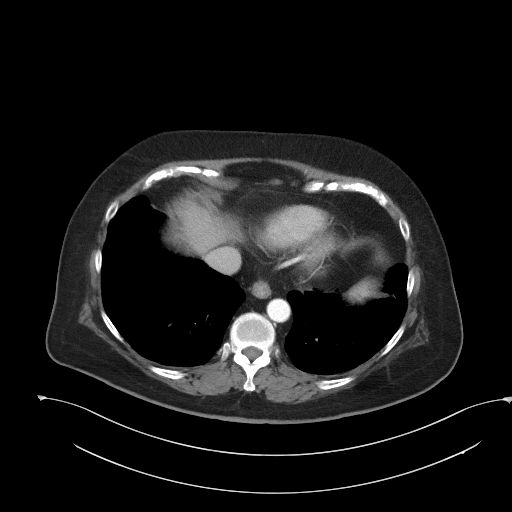

[Series 4: coronal st · coronal · 0.87mm/px · 3 of 106 slices shown]
[im 36/106  soft-tissue]
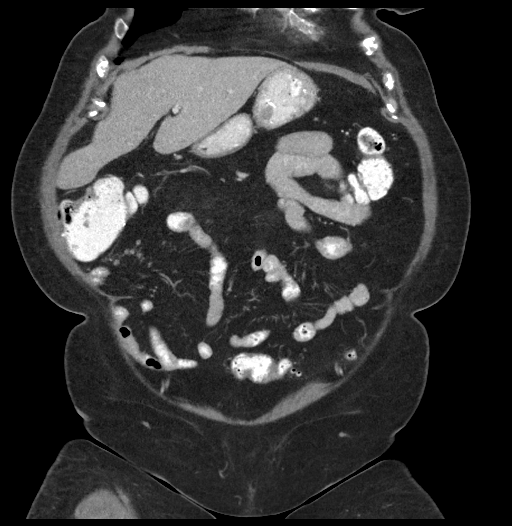
[im 47/106  soft-tissue]
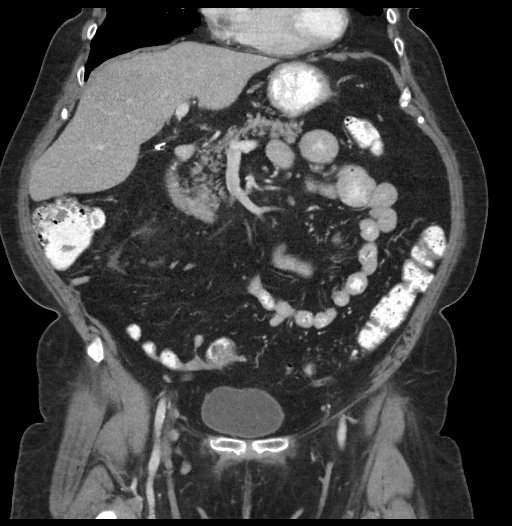
[im 59/106  soft-tissue]
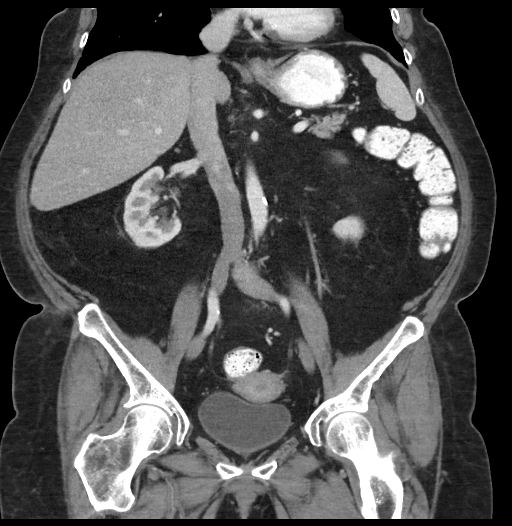

[16 of 46 positions shown; findings below may reference images not displayed]

FINDINGS: Lower chest: No acute abnormality.

Hepatobiliary: No focal liver abnormality is seen. Status post
cholecystectomy. No biliary dilatation.

Pancreas: Unremarkable. No pancreatic ductal dilatation or
surrounding inflammatory changes.

Spleen: Normal in size without significant abnormality.

Adrenals/Urinary Tract: Adrenal glands are unremarkable. Kidneys are
normal, without renal calculi, solid lesion, or hydronephrosis.
Bladder is unremarkable.

Stomach/Bowel: Stomach is within normal limits. Redemonstrated
postoperative findings of ileocolectomy and reanastomosis. Sigmoid
diverticulosis.

Vascular/Lymphatic: Scattered aortic atherosclerosis. There are
prominent lymph nodes in the right mesocolon which are slightly
increased in size compared to prior examination, the largest node
measuring 1.8 x 0.7 cm, previously 1.5 x 0.4 cm (series 2, image
46). There are small nodules of the transverse mesocolon or omentum,
which are new compared to prior examination, largest measuring 9 mm
(series 2, image 40).

Reproductive: Fluid in the endometrial cavity, unchanged compared to
prior examination.

Other: Redemonstrated, broad-based midline, fat containing ventral
hernia components (series 2, image 46). There appears to have been
an interval partial repair of the largest component inferiorly. No
abdominopelvic ascites.

Musculoskeletal: No acute or significant osseous findings.
IMPRESSION: 1. Redemonstrated postoperative findings of ileocolectomy and
reanastomosis.
2. There are prominent lymph nodes in the right mesocolon which are
slightly increased in size compared to prior examination, the
largest node measuring 1.8 x 0.7 cm, previously 1.5 x 0.4 cm.
Findings are nonspecific although concerning for nodal metastatic
disease.
3. There are additional new small nodules of the transverse
mesocolon or omentum measuring up to 9 mm, likewise concerning for
nodal or omental metastatic disease.
4. Redemonstrated broad-based midline, fat containing ventral hernia
components.
5. Fluid in the endometrial cavity, abnormal in the late
postmenopausal setting although unchanged compared to prior
examination.
6. Status post interval cholecystectomy.
7. Aortic Atherosclerosis ([XL]-[XL]).

## 2019-09-11 MED ORDER — SODIUM CHLORIDE (PF) 0.9 % IJ SOLN
INTRAMUSCULAR | Status: AC
  Filled 2019-09-11: qty 50

## 2019-09-11 MED ORDER — IOHEXOL 300 MG/ML  SOLN
100.0000 mL | Freq: Once | INTRAMUSCULAR | Status: AC | PRN
  Administered 2019-09-11: 100 mL via INTRAVENOUS

## 2019-09-12 NOTE — Progress Notes (Signed)
[place]   [phone] [phone]   Clinic Follow up Note   Patient Care Team: Binnie Rail, MD as PCP - General (Internal Medicine) Truitt Merle, MD as Consulting Physician (Hematology) Jackolyn Confer, MD as Consulting Physician (General Surgery)  Date of Service:  09/18/2019  CHIEF COMPLAINT: Follow up carcinoid tumor  SUMMARY OF ONCOLOGIC HISTORY: Oncology History Overview Note  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   Staging form: Colon and Rectum, AJCC 7th Edition     Clinical: T3, N2, M0 - Unsigned     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 ([place])  01/21/2014 Imaging   CT: Distal small bowel obstruction due to 3.4 cm soft tissue mass near the ileocecal valve, with adjacent right lower quadrant mesenteric lymphadenopathy   02/03/2014 Pathologic Stage   invasive well diff neuroendocrine tumor (carcinoid), 3cm, pT3pN2 (5/25 nodes positive). negative margines.   02/03/2014 Surgery   Laparoscopic-assisted right colectomy and resection of distal ileum   02/03/2014 Initial Diagnosis   Metastatic malignant neuroendocrine tumor of the ileocecal valve to regional lymph nodes.    05/31/2015 Imaging   CT A/P w contrast  IMPRESSION: 1. No evidence of metastatic disease. 2. Question mild basilar subpleural pulmonary fibrosis.   09/11/2019 Imaging   CT AP W contrast    IMPRESSION: 1. Redemonstrated postoperative findings of ileocolectomy and reanastomosis. 2. There are prominent lymph nodes in the right mesocolon which are slightly increased in size compared to prior examination, the largest node measuring 1.8 x 0.7 cm, previously 1.5 x 0.4 cm. Findings are nonspecific although concerning for nodal metastatic disease. 3. There are additional new small nodules of the transverse mesocolon or omentum measuring up to 9 mm, likewise concerning for nodal or omental metastatic disease. 4. Redemonstrated  broad-based midline, fat containing ventral hernia components. 5. Fluid in the endometrial cavity, abnormal in the late postmenopausal setting although unchanged compared to prior examination. 6. Status post interval cholecystectomy. 7. Aortic Atherosclerosis (ICD10-I70.0).      CURRENT THERAPY:  Surveillance   INTERVAL HISTORY:  Jessica Farrell is here for a follow up of carcinoid tumor. She was last seen by me 1 year ago. She presents to the clinic alone. She is doing well. She notes she has middle lower abdominal discomfort when she gets gas. She has had gas intermittently since her surgery. She notes her gas can be related to what she eats, but overall stable. She notes she has diarrhea often after eating certain meals. She will have diarrhea 3-4 times a day with watery stool. This has been stable since surgery.    REVIEW OF SYSTEMS:   Constitutional: Denies fevers, chills or abnormal weight loss Eyes: Denies blurriness of vision Ears, nose, mouth, throat, and face: Denies mucositis or sore throat Respiratory: Denies cough, dyspnea or wheezes Cardiovascular: Denies palpitation, chest discomfort or lower extremity swelling Gastrointestinal:  Denies nausea, heartburn (+) Diarrhea (+) Intermittent Abdominal gas and lower abdominal pain Skin: Denies abnormal skin rashes Lymphatics: Denies new lymphadenopathy or easy bruising Neurological:Denies numbness, tingling or new weaknesses Behavioral/Psych: Mood is stable, no new changes  All other systems were reviewed with the patient and are negative.  MEDICAL HISTORY:  Past Medical History:  Diagnosis Date  . Asthma   . Cellulitis March  2014   Left foot and ankle  . colon ca dx'd 01/2014  . Colon polyps    2015   . Diabetes mellitus ([place]) 05/05/2012   Type II  . Hyperlipidemia   .  Morbid obesity ([place])   . Thyroid disease 1990's   HypoThyroidism    SURGICAL HISTORY: Past Surgical History:  Procedure Laterality Date  .  CHOLECYSTECTOMY N/A 05/02/2017   Procedure: LAPAROSCOPIC CHOLECYSTECTOMY WITH INTRAOPERATIVE CHOLANGIOGRAM, VENTRAL HERNIA REPAIR;  Surgeon: Jovita Kussmaul, MD;  Location: [facility];  Service: General;  Laterality: N/A;  . COLON RESECTION N/A 02/03/2014   Procedure: LAPAROSCOPIC ASSISTED BOWEL RESECTION;  Surgeon: Jackolyn Confer, MD;  Location: [facility];  Service: General;  Laterality: N/A;  . FRACTURE SURGERY  2000   Ankle  . TOOTH EXTRACTION     wisdom Teeth    I have reviewed the social history and family history with the patient and they are unchanged from previous note.  ALLERGIES:  is allergic to penicillins.  MEDICATIONS:  Current Outpatient Medications  Medication Sig Dispense Refill  . albuterol (PROVENTIL HFA;VENTOLIN HFA) 108 (90 Base) MCG/ACT inhaler Inhale 2 puffs into the lungs every 6 (six) hours as needed for wheezing or shortness of breath. 1 Inhaler 8  . atorvastatin (LIPITOR) 10 MG tablet Take 1 tablet (10 mg total) by mouth daily. Need office visit for more refills. 30 tablet 0  . Bismuth Subsalicylate (PEPTO-BISMOL PO) Take 2 tablets by mouth 2 (two) times daily as needed [facility] PAIN).    Marland Kitchen blood glucose meter kit and supplies KIT Dispense based on patient and insurance preference. Use up to 2 times daily as directed. DX code: E11.9 1 each 0  . ciclopirox (PENLAC) 8 % solution Apply topically at bedtime. Apply over nail & surrounding skin. Apply daily over previous coat. After 7 days, may remove w alcohol &continue 6.6 mL 5  . gabapentin (NEURONTIN) 100 MG capsule Take 1 capsule (100 mg total) by mouth at bedtime. 30 capsule 5  . glucose blood test strip Based on insurance preference. Use as instructed once daily to check sugars. Dx E11.9 100 each 12  . GuaiFENesin (MUCINEX PO) Take 400 mg by mouth daily as needed for congestion.    . Lancets (ONETOUCH ULTRASOFT) lancets Use as instructed once daily to check sugars. Dx E11.9 100 each 12  . levothyroxine (SYNTHROID) 137 MCG  tablet TAKE 1 TABLET DAILY BEFORE BREAKFAST. NEED OFFICE VISIT WITH LABS  30 tablet 0  . metFORMIN (GLUCOPHAGE) 500 MG tablet TAKE 1 TABLET TWICE DAILY WITH MEALS 60 tablet 0  . montelukast (SINGULAIR) 10 MG tablet Take 1 tablet (10 mg total) by mouth at bedtime. 90 tablet 1  . Multiple Vitamins-Minerals (CENTRUM SILVER PO) Take 1 tablet by mouth daily.    Marland Kitchen Phenylephrine HCl (SINEX REGULAR NA) Place 1 spray into the nose daily as needed (ALLERGIES).    . promethazine (PHENERGAN) 12.5 MG tablet Take 1 tablet (12.5 mg total) by mouth every 6 (six) hours as needed for nausea or vomiting. 30 tablet 0  . terbinafine (LAMISIL AT) 1 % cream Apply 1 application topically 2 (two) times daily. 42 g 2  . umeclidinium-vilanterol (ANORO ELLIPTA) 62.5-25 MCG/INH AEPB Inhale 1 puff daily into the lungs. 60 each 5   No current facility-administered medications for this visit.    PHYSICAL EXAMINATION: ECOG PERFORMANCE STATUS: 1 - Symptomatic but completely ambulatory  Vitals:   09/18/19 1044  BP: 125/74  Pulse: 70  Resp: 18  Temp: (!) 97.5 F (36.4 C)  SpO2: 97%   Filed Weights   09/18/19 1044  Weight: 186 lb 6.4 oz (84.6 kg)    GENERAL:alert, no distress and comfortable SKIN: skin color, texture, turgor  are normal, no rashes or significant lesions EYES: normal, Conjunctiva are pink and non-injected, sclera clear  NECK: supple, thyroid normal size, non-tender, without nodularity LYMPH:  no palpable lymphadenopathy in the cervical, axillary  LUNGS: clear to auscultation and percussion with normal breathing effort HEART: regular rate & rhythm and no murmurs and no lower extremity edema ABDOMEN:abdomen soft, non-tender and normal bowel sounds (+) Umbilical hernia  Musculoskeletal:no cyanosis of digits and no clubbing  NEURO: alert & oriented x 3 with fluent speech, no focal motor/sensory deficits  LABORATORY DATA:  I have reviewed the data as listed CBC Latest Ref Rng & Units 09/09/2019  07/26/2018 07/27/2017  WBC 4.0 - 10.5 K/uL 6.3 6.4 7.2  Hemoglobin 12.0 - 15.0 g/dL 12.2 12.2 12.5  Hematocrit 36 - 46 % 38.6 39.0 38.3  Platelets 150 - 400 K/uL 216 208 218     CMP Latest Ref Rng & Units 09/09/2019 07/26/2018 07/27/2017  Glucose 70 - 99 mg/dL 156(H) 135(H) 136  BUN 8 - 23 mg/dL '17 13 16  '$ Creatinine 0.44 - 1.00 mg/dL 0.94 0.79 0.81  Sodium 135 - 145 mmol/L 140 139 139  Potassium 3.5 - 5.1 mmol/L 4.4 4.2 4.5  Chloride 98 - 111 mmol/L 107 105 106  CO2 22 - 32 mmol/L '25 25 25  '$ Calcium 8.9 - 10.3 mg/dL 8.9 8.8(L) 9.3  Total Protein 6.5 - 8.1 g/dL 7.1 7.2 7.2  Total Bilirubin 0.3 - 1.2 mg/dL 0.3 0.4 0.4  Alkaline Phos 38 - 126 U/L 148(H) 177(H) 190(H)  AST 15 - 41 U/L '17 16 23  '$ ALT 0 - 44 U/L '19 19 29     '$ RADIOGRAPHIC STUDIES: I have personally reviewed the radiological images as listed and agreed with the findings in the report. No results found.   ASSESSMENT & PLAN:  Vallerie Hentz is a 74 y.o. female with   1. Well differentiated low-grade neuroendocrine tumor of terminal ileum (carcinoid tumor), pT3 N1 M0, stage IIIB -She was diagnosed in 01/2014. Her tumor has been completely resected. Surgical margins were negative. -There is no role of adjuvant chemotherapy for resected carcinoid tumor.  -We previously discussed the risks of tumor recurrence in the future, including late recurrence. -Her postop chromogranin A and urine 5 HIAA level were normal. Those were not checked before surgery. -I reviewed and discussed her CT AP from 09/11/19 in person with pt which showed multiple right mesocolon LNs have increased in size, largest one from 1.5cm to 1.8cm which is nonspecific although concerning for nodal metastatic disease. -I discussed her diagnosis was over 5 years ago, but can have late recurrence. I recommend PET scan for further evaluation. If LNs are positive I would be more concerned this is cancer recurrence with metastasis. She agreed to PET.  -I briefly discussed if  she is found to have cancer recurrence, I would start her on Sandostatin injections to control her disease.  -She otherwise has been clinically stable. Her intermittent gas with abdominal discomfort and her diarrhea has been stable since surgery. -Labs reviewed from last week, CBC and CMP WNL except BG 156, alk phos 148. Chromogranin A normal. Physical exam stable with umbilical hernia.   -Phone visit in 2 weeks.    2. HTN, DM, hypothyroidism, mild neuropathy  -She will continue medication and follow-up with her primary care physician. -She has mild tingling of LE from DM. She rarely uses Gabapentin as needed.   3.Cholecystitis status post colectomy in March 2019  4. Cancer screenings -03/2018 Mammogram  unremarkable. 03/2018 DEXA shows osteopenia (lowest T-score -1.7 at left femur neck). Continue mammograms yearly.    Plan: -Phone visit in 2 weeks with PET scan a few days before   No problem-specific Assessment & Plan notes found for this encounter.   Orders Placed This Encounter  Procedures  . NM PET (NETSPOT GA 46 DOTATATE) SKULL BASE TO MID THIGH    Abdominal adenopathy on recent CT, rule out recurrence    Standing Status:   Future    Standing Expiration Date:   09/17/2020    Order Specific Question:   If indicated for the ordered procedure, I authorize the administration of a radiopharmaceutical per Radiology protocol    Answer:   Yes    Order Specific Question:   Preferred imaging location?    Answer:   [facility]    Order Specific Question:   Radiology Contrast Protocol - do NOT remove file path    Answer:   [email or web address]   All questions were answered. The patient knows to call the clinic with any problems, questions or concerns. No barriers to learning was detected. The total time spent in the appointment was 30 minutes.     Truitt Merle, MD 09/18/2019   I, Joslyn Devon, am acting as scribe for Truitt Merle, MD.   I have reviewed the  above documentation for accuracy and completeness, and I agree with the above.

## 2019-09-15 DIAGNOSIS — E039 Hypothyroidism, unspecified: Secondary | ICD-10-CM | POA: Diagnosis not present

## 2019-09-15 DIAGNOSIS — Z7984 Long term (current) use of oral hypoglycemic drugs: Secondary | ICD-10-CM | POA: Diagnosis not present

## 2019-09-15 DIAGNOSIS — E785 Hyperlipidemia, unspecified: Secondary | ICD-10-CM | POA: Diagnosis not present

## 2019-09-15 DIAGNOSIS — E1140 Type 2 diabetes mellitus with diabetic neuropathy, unspecified: Secondary | ICD-10-CM | POA: Diagnosis not present

## 2019-09-15 DIAGNOSIS — J45909 Unspecified asthma, uncomplicated: Secondary | ICD-10-CM | POA: Diagnosis not present

## 2019-09-15 DIAGNOSIS — D3A8 Other benign neuroendocrine tumors: Secondary | ICD-10-CM | POA: Diagnosis not present

## 2019-09-15 DIAGNOSIS — I10 Essential (primary) hypertension: Secondary | ICD-10-CM | POA: Diagnosis not present

## 2019-09-15 DIAGNOSIS — Z79899 Other long term (current) drug therapy: Secondary | ICD-10-CM | POA: Diagnosis not present

## 2019-09-17 LAB — 5 HIAA, QUANTITATIVE, URINE, 24 HOUR
5-HIAA, Ur: 5.1 mg/L
5-HIAA,Quant.,24 Hr Urine: 4.8 mg/24 hr (ref 0.0–14.9)
Total Volume: 950

## 2019-09-18 ENCOUNTER — Inpatient Hospital Stay: Payer: Medicare HMO | Admitting: Hematology

## 2019-09-18 ENCOUNTER — Encounter: Payer: Self-pay | Admitting: Hematology

## 2019-09-18 ENCOUNTER — Other Ambulatory Visit: Payer: Self-pay

## 2019-09-18 VITALS — BP 125/74 | HR 70 | Temp 97.5°F | Resp 18 | Ht 65.0 in | Wt 186.4 lb

## 2019-09-18 DIAGNOSIS — Z7984 Long term (current) use of oral hypoglycemic drugs: Secondary | ICD-10-CM | POA: Diagnosis not present

## 2019-09-18 DIAGNOSIS — I10 Essential (primary) hypertension: Secondary | ICD-10-CM | POA: Diagnosis not present

## 2019-09-18 DIAGNOSIS — E785 Hyperlipidemia, unspecified: Secondary | ICD-10-CM | POA: Diagnosis not present

## 2019-09-18 DIAGNOSIS — Z79899 Other long term (current) drug therapy: Secondary | ICD-10-CM | POA: Diagnosis not present

## 2019-09-18 DIAGNOSIS — C7B8 Other secondary neuroendocrine tumors: Secondary | ICD-10-CM

## 2019-09-18 DIAGNOSIS — J45909 Unspecified asthma, uncomplicated: Secondary | ICD-10-CM | POA: Diagnosis not present

## 2019-09-18 DIAGNOSIS — E039 Hypothyroidism, unspecified: Secondary | ICD-10-CM | POA: Diagnosis not present

## 2019-09-18 DIAGNOSIS — D3A8 Other benign neuroendocrine tumors: Secondary | ICD-10-CM | POA: Diagnosis not present

## 2019-09-18 DIAGNOSIS — E1140 Type 2 diabetes mellitus with diabetic neuropathy, unspecified: Secondary | ICD-10-CM | POA: Diagnosis not present

## 2019-09-19 ENCOUNTER — Telehealth: Payer: Self-pay | Admitting: Hematology

## 2019-09-19 NOTE — Telephone Encounter (Signed)
Scheduled per 7/29 los. Unable to reach pt. Left voicemail with appt time and date.

## 2019-09-29 ENCOUNTER — Other Ambulatory Visit: Payer: Self-pay | Admitting: Internal Medicine

## 2019-09-29 DIAGNOSIS — E1165 Type 2 diabetes mellitus with hyperglycemia: Secondary | ICD-10-CM

## 2019-10-03 ENCOUNTER — Inpatient Hospital Stay: Payer: Medicare HMO | Admitting: Hematology

## 2019-10-06 ENCOUNTER — Other Ambulatory Visit: Payer: Self-pay

## 2019-10-06 ENCOUNTER — Ambulatory Visit (HOSPITAL_COMMUNITY)
Admission: RE | Admit: 2019-10-06 | Discharge: 2019-10-06 | Disposition: A | Discharge status: Home / Self Care | Payer: Medicare HMO | Source: Ambulatory Visit | Attending: Hematology | Admitting: Hematology

## 2019-10-06 DIAGNOSIS — M533 Sacrococcygeal disorders, not elsewhere classified: Secondary | ICD-10-CM | POA: Diagnosis not present

## 2019-10-06 DIAGNOSIS — R918 Other nonspecific abnormal finding of lung field: Secondary | ICD-10-CM | POA: Diagnosis not present

## 2019-10-06 DIAGNOSIS — K918 Other intraoperative and postprocedural complications and disorders of digestive system: Secondary | ICD-10-CM | POA: Diagnosis not present

## 2019-10-06 DIAGNOSIS — K7689 Other specified diseases of liver: Secondary | ICD-10-CM | POA: Diagnosis not present

## 2019-10-06 DIAGNOSIS — C7B8 Other secondary neuroendocrine tumors: Secondary | ICD-10-CM | POA: Insufficient documentation

## 2019-10-06 DIAGNOSIS — C799 Secondary malignant neoplasm of unspecified site: Secondary | ICD-10-CM | POA: Diagnosis not present

## 2019-10-06 DIAGNOSIS — C49A0 Gastrointestinal stromal tumor, unspecified site: Secondary | ICD-10-CM | POA: Diagnosis not present

## 2019-10-06 IMAGING — PT NM PET SKULL BASE TO THIGH
7 series · 25 of 25 positions shown · non-contrast
Comparison: None.

CLINICAL DATA: Gastrointestinal stromal tumor. Patient status post
small bowel carcinoid with resection of the terminal ileum and
reanastomosis. Nodular peritoneal implants noted on recent CT scan.

EXAM:
NUCLEAR MEDICINE PET SKULL BASE TO THIGH
TECHNIQUE: 4.73 mCi Ga 68 DOTATATE was injected intravenously. Full-ring PET
imaging was performed from the skull base to thigh after the
radiotracer. CT data was obtained and used for attenuation
correction and anatomic localization.

[Series 3: pet sk_thigh ac · axial · 5.0mm · 4.07mm/px · z∈[-1492,-584]mm · 6 of 228 slices shown]
[im 1/228]
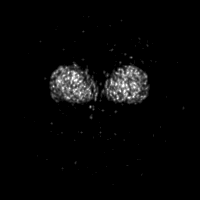
[im 46/228]
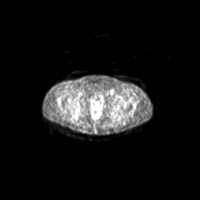
[im 91/228]
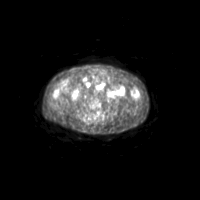
[im 137/228]
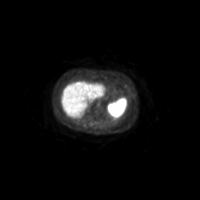
[im 182/228]
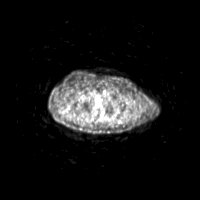
[im 228/228]
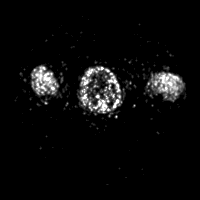

[Series 4: ct sk_thigh 5.0 b31f · axial · 5.0mm · 0.98mm/px · z∈[-1492,-584]mm · 5 of 228 slices shown]
[im 1/228]
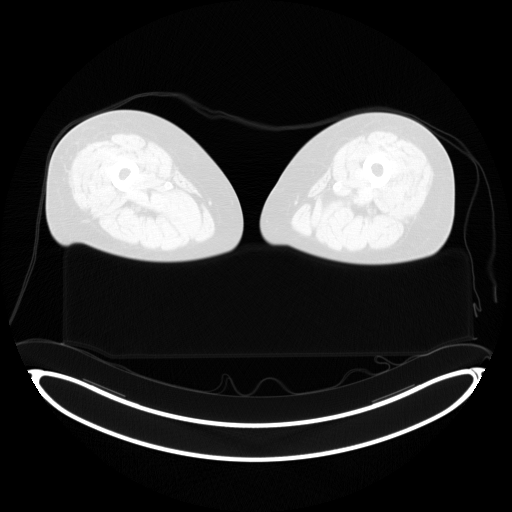
[im 57/228]
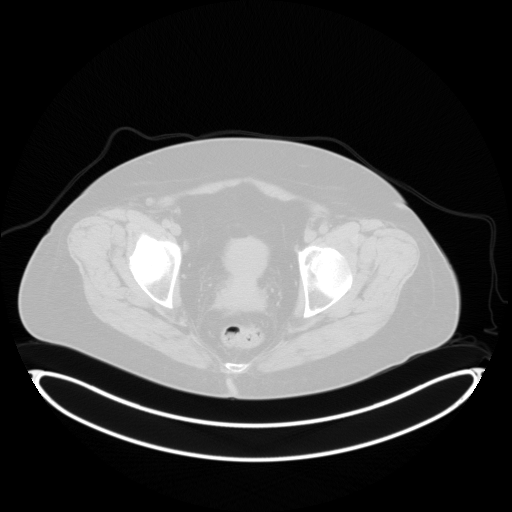
[im 114/228]
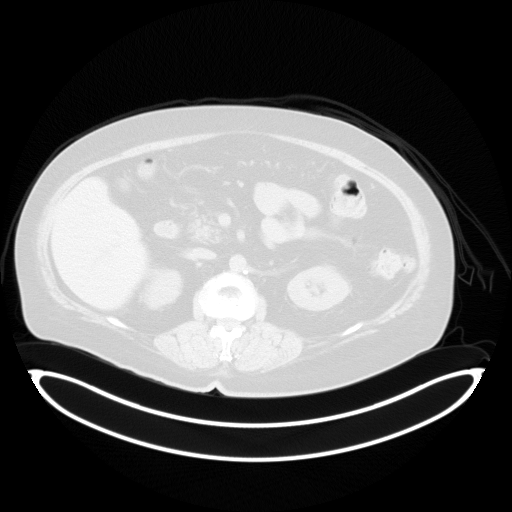
[im 171/228]
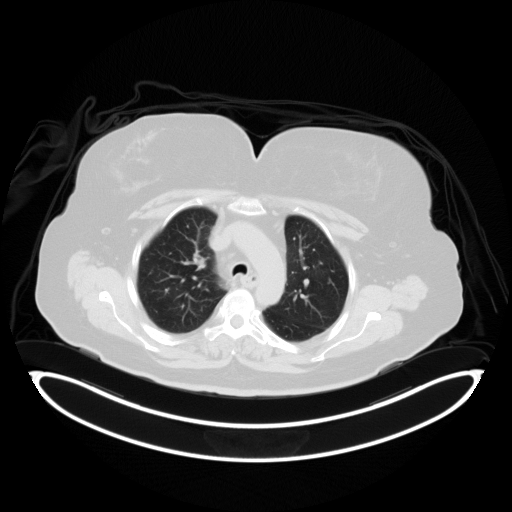
[im 228/228  brain]
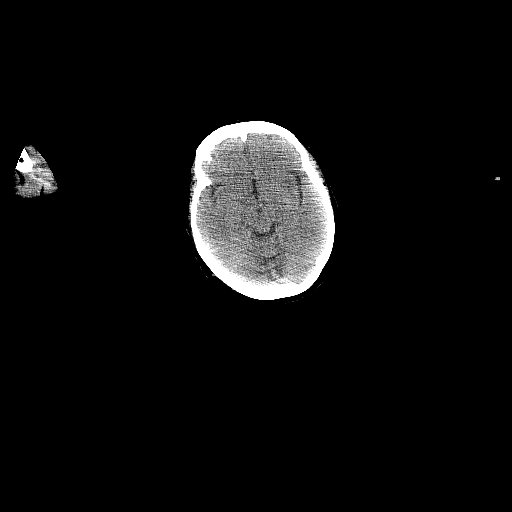

[Series 5: pet sk_thigh nac · axial · 5.0mm · 4.07mm/px · z∈[-1492,-584]mm · 5 of 228 slices shown]
[im 1/228]
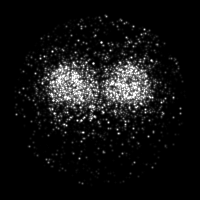
[im 57/228]
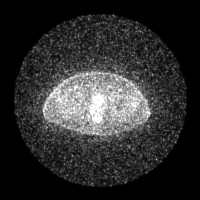
[im 114/228]
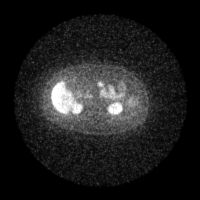
[im 171/228]
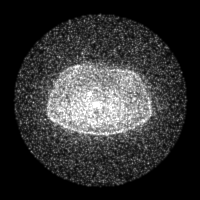
[im 228/228]
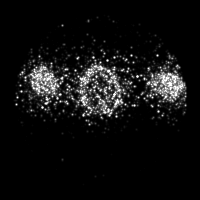

[Series 8: ct sk_thigh 5.0 b70f (id)_bone · axial · 5.0mm · 0.71mm/px · z∈[-1002,-710]mm · 2 of 74 slices shown]
[im 1/74  bone]
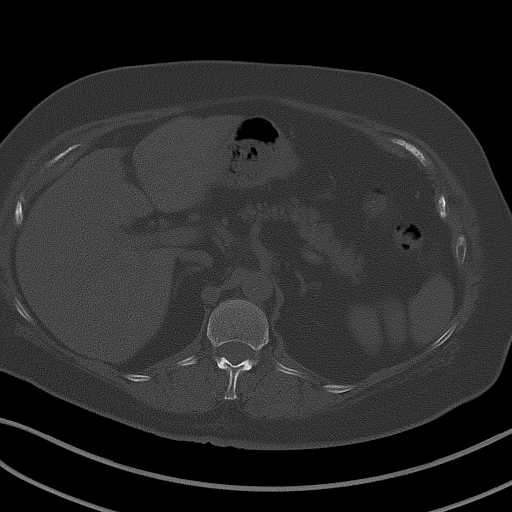
[im 74/74  bone]
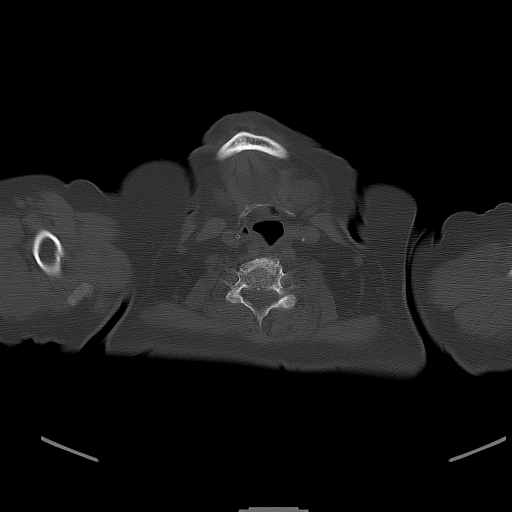

[Series 603: range-ct sk_thigh 5.0 (id)<alpha range> · 2 of 70 slices shown (1 of 2)]
[im 1/70]
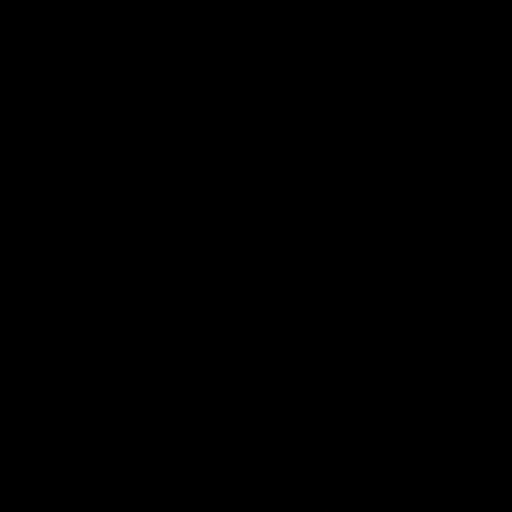
[im 70/70]
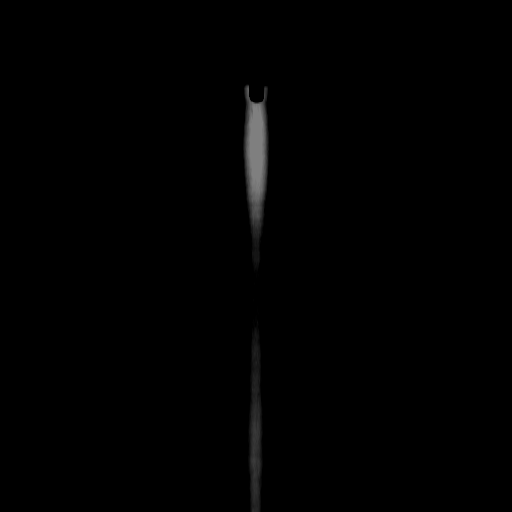

[Series 604: mip range 5 · coronal · 1.88mm/px · 1 of 32 slices shown]
[im 1/32]
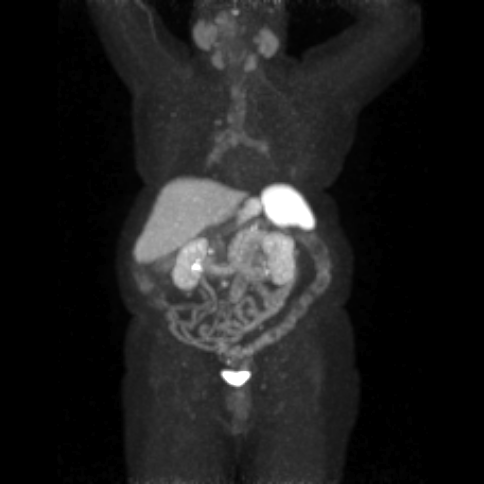

[Series 605: range-ct sk_thigh 5.0 (id)<alpha range> · 4 of 181 slices shown (2 of 2)]
[im 1/181]
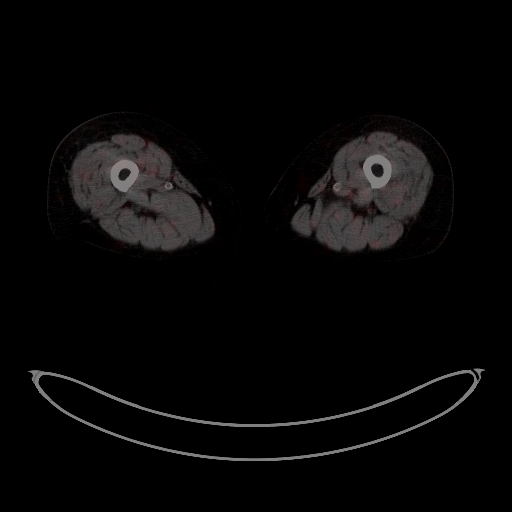
[im 61/181]
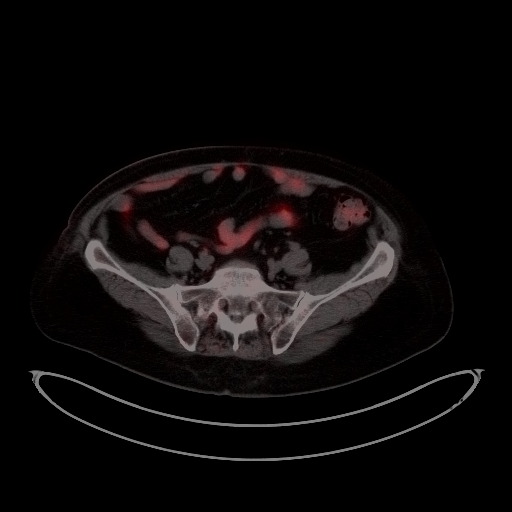
[im 121/181]
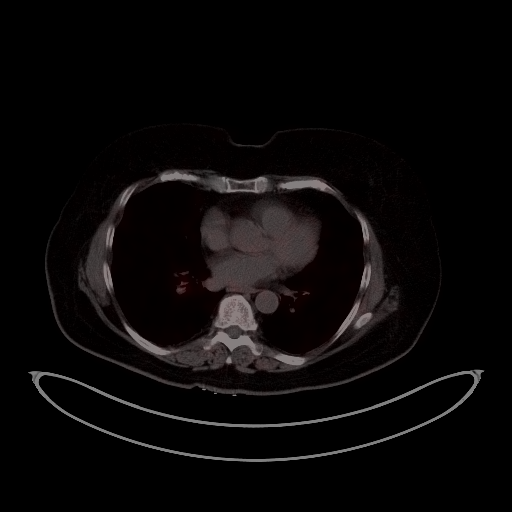
[im 181/181]
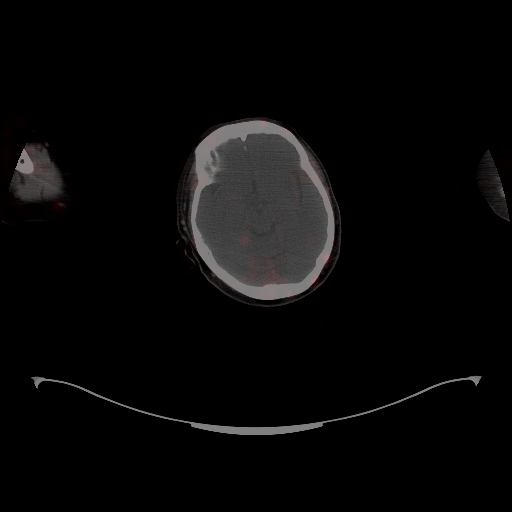

[25 of 25 positions shown; findings below may reference images not displayed]

FINDINGS: NECK

No radiotracer activity in neck lymph nodes.

Incidental CT findings: None

CHEST

Mild radiotracer activity associated with a subcarinal lymph node
measuring 7 mm with SUV max equal 4.4. Similar activity of a RIGHT
hilar lymph node.

Very small subpleural nodule in the medial RIGHT upper lobe along
the mediastinal surface measures 4 mm (image 59/4) with mild
metabolic activity (SUV max equal 3.1).

Along the horizontal fissure of the RIGHT middle lobe 6 mm nodule
(image 71/4) has very faint radiotracer activity with SUV max equal
1.0. Additional nodule in the LEFT lower lobe measuring 6 mm image
82 also has faint radiotracer activity with SUV max equal 2.1.

Incidental CT finding:None

ABDOMEN/PELVIS

Post ileocecal resection with reanastomosis. NO radiotracer activity
along the anastomosis.

Several small peritoneal implants do have radiotracer activity. For
example ventral peritoneal nodule inferior to the transverse colon
measuring 10 mm (image 134/4) with SUV max equal 10.3.

Small nodule adjacent to the head of the pancreas measures 5 mm on
image 119/4 with SUV max equal 14.8.

There are several foci of radiotracer within liver that are elevated
above background physiologic liver activity. These are small and
difficult to characterize. For example approximately 1 cm lesion
within the central RIGHT hepatic lobe SUV max equal 11.4 on image
116 fused data set. Small lesion in the LEFT hepatic lobe just above
background on image 106.

Physiologic activity noted in the liver, spleen, adrenal glands and
kidneys.

Incidental CT findings:

SKELETON

Single focus of radiotracer activity along the posterior aspect of
the LEFT SI joint with SUV max equal 4.3 is indeterminate.

Incidental CT findings:None
IMPRESSION: 1. Several small nodules in the peritoneal space and retroperitoneal
space with intense radiotracer activity consistent with metastatic
well differentiated neuroendocrine tumor.
2. Several small liver lesions above background activity are
indeterminate. Consider MRI liver with and without contrast.
3. Radiotracer activity within mediastinal lymph nodes and bilateral
pulmonary nodules consistent with metastatic well-differentiated
neuroendocrine tumor to the lungs. Activity is very low within these
lesions but measurable.
4. No clear evidence skeletal metastasis. Single lesion in the
posterior LEFT SI joint warrants attention on follow-up.
5. Although there are multiple sites of metastatic disease, the
overall tumor burden of metastatic disease very small.

## 2019-10-06 MED ORDER — GALLIUM GA 68 DOTATATE IV KIT
4.7300 | PACK | Freq: Once | INTRAVENOUS | Status: AC | PRN
  Administered 2019-10-06: 4.73 via INTRAVENOUS

## 2019-10-07 ENCOUNTER — Telehealth: Payer: Self-pay | Admitting: Hematology

## 2019-10-08 ENCOUNTER — Inpatient Hospital Stay: Payer: Medicare HMO | Attending: Hematology | Admitting: Hematology

## 2019-10-08 ENCOUNTER — Encounter: Payer: Self-pay | Admitting: Hematology

## 2019-10-08 DIAGNOSIS — C7B8 Other secondary neuroendocrine tumors: Secondary | ICD-10-CM

## 2019-10-08 NOTE — Progress Notes (Signed)
[place]   Telephone:([phone]) [phone] Fax:(336) [phone]   Clinic Follow up Note   Patient Care Team: Binnie Rail, MD as PCP - General (Internal Medicine) Truitt Merle, MD as Consulting Physician (Hematology) Jackolyn Confer, MD as Consulting Physician (General Surgery)   I connected with Dolores Lory on 10/08/2019 at 11:40 AM EDT by telephone visit and verified that I am speaking with the correct person using two identifiers.  I discussed the limitations, risks, security and privacy concerns of performing an evaluation and management service by telephone and the availability of in person appointments. I also discussed with the patient that there may be a patient responsible charge related to this service. The patient expressed understanding and agreed to proceed.   Other persons participating in the visit and their role in the encounter:  None  Patient's location:  Her home  Provider's location:  My Office   CHIEF COMPLAINT: Follow up carcinoid tumor  SUMMARY OF ONCOLOGIC HISTORY: Oncology History Overview Note  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   Staging form: Colon and Rectum, AJCC 7th Edition     Clinical: T3, N2, M0 - Unsigned     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 ([place])  01/21/2014 Imaging   CT: Distal small bowel obstruction due to 3.4 cm soft tissue mass near the ileocecal valve, with adjacent right lower quadrant mesenteric lymphadenopathy   02/03/2014 Pathologic Stage   invasive well diff neuroendocrine tumor (carcinoid), 3cm, pT3pN2 (5/25 nodes positive). negative margines.   02/03/2014 Surgery   Laparoscopic-assisted right colectomy and resection of distal ileum   02/03/2014 Initial Diagnosis   Metastatic malignant neuroendocrine tumor of the ileocecal valve to regional lymph nodes.    05/31/2015 Imaging   CT A/P w contrast  IMPRESSION: 1. No evidence of metastatic disease. 2. Question mild basilar  subpleural pulmonary fibrosis.   09/11/2019 Imaging   CT AP W contrast    IMPRESSION: 1. Redemonstrated postoperative findings of ileocolectomy and reanastomosis. 2. There are prominent lymph nodes in the right mesocolon which are slightly increased in size compared to prior examination, the largest node measuring 1.8 x 0.7 cm, previously 1.5 x 0.4 cm. Findings are nonspecific although concerning for nodal metastatic disease. 3. There are additional new small nodules of the transverse mesocolon or omentum measuring up to 9 mm, likewise concerning for nodal or omental metastatic disease. 4. Redemonstrated broad-based midline, fat containing ventral hernia components. 5. Fluid in the endometrial cavity, abnormal in the late postmenopausal setting although unchanged compared to prior examination. 6. Status post interval cholecystectomy. 7. Aortic Atherosclerosis (ICD10-I70.0).   10/06/2019 PET scan   IMPRESSION: 1. Several small nodules in the peritoneal space and retroperitoneal space with intense radiotracer activity consistent with metastatic well differentiated neuroendocrine tumor. 2. Several small liver lesions above background activity are indeterminate. Consider MRI liver with and without contrast. 3. Radiotracer activity within mediastinal lymph nodes and bilateral pulmonary nodules consistent with metastatic well-differentiated neuroendocrine tumor to the lungs. Activity is very low within these lesions but measurable. 4. No clear evidence skeletal metastasis. Single lesion in the posterior LEFT SI joint warrants attention on follow-up. 5. Although there are multiple sites of metastatic disease, the overall tumor burden of metastatic disease very small.      CURRENT THERAPY:  Surveillance   INTERVAL HISTORY:  Jessica Farrell is here for a follow up of carcinoid tumor.  She notes she recently has been coughing with mild phlegm. Today she feels better. She denies  fever, chest pain or SOB. She feels she caught it from her granddaughter. She notes she has received her COVID19 vaccine, but has not been tested. She does not feel this is COVID.  She still has diarrhea from her surgery. She notes Imodium has helped some.    REVIEW OF SYSTEMS:   Constitutional: Denies fevers, chills or abnormal weight loss Eyes: Denies blurriness of vision Ears, nose, mouth, throat, and face: Denies mucositis or sore throat Respiratory: Denies dyspnea or wheezes (+) Cough with mild phlegm Cardiovascular: Denies palpitation, chest discomfort or lower extremity swelling Gastrointestinal:  Denies nausea, heartburn (+) Improved Diarrhea  Skin: Denies abnormal skin rashes Lymphatics: Denies new lymphadenopathy or easy bruising Neurological:Denies numbness, tingling or new weaknesses Behavioral/Psych: Mood is stable, no new changes  All other systems were reviewed with the patient and are negative.  MEDICAL HISTORY:  Past Medical History:  Diagnosis Date  . Asthma   . Cellulitis March  2014   Left foot and ankle  . colon ca dx'd 01/2014  . Colon polyps    2015   . Diabetes mellitus ([place]) 05/05/2012   Type II  . Hyperlipidemia   . Morbid obesity ([place])   . Thyroid disease 1990's   HypoThyroidism    SURGICAL HISTORY: Past Surgical History:  Procedure Laterality Date  . CHOLECYSTECTOMY N/A 05/02/2017   Procedure: LAPAROSCOPIC CHOLECYSTECTOMY WITH INTRAOPERATIVE CHOLANGIOGRAM, VENTRAL HERNIA REPAIR;  Surgeon: Jovita Kussmaul, MD;  Location: [facility];  Service: General;  Laterality: N/A;  . COLON RESECTION N/A 02/03/2014   Procedure: LAPAROSCOPIC ASSISTED BOWEL RESECTION;  Surgeon: Jackolyn Confer, MD;  Location: [facility];  Service: General;  Laterality: N/A;  . FRACTURE SURGERY  2000   Ankle  . TOOTH EXTRACTION     wisdom Teeth    I have reviewed the social history and family history with the patient and they are unchanged from previous note.  ALLERGIES:  is allergic  to penicillins.  MEDICATIONS:  Current Outpatient Medications  Medication Sig Dispense Refill  . albuterol (PROVENTIL HFA;VENTOLIN HFA) 108 (90 Base) MCG/ACT inhaler Inhale 2 puffs into the lungs every 6 (six) hours as needed for wheezing or shortness of breath. 1 Inhaler 8  . atorvastatin (LIPITOR) 10 MG tablet Take 1 tablet (10 mg total) by mouth daily. Need office visit for more refills. 30 tablet 0  . Bismuth Subsalicylate (PEPTO-BISMOL PO) Take 2 tablets by mouth 2 (two) times daily as needed [facility] PAIN).    Marland Kitchen blood glucose meter kit and supplies KIT Dispense based on patient and insurance preference. Use up to 2 times daily as directed. DX code: E11.9 1 each 0  . ciclopirox (PENLAC) 8 % solution Apply topically at bedtime. Apply over nail & surrounding skin. Apply daily over previous coat. After 7 days, may remove w alcohol &continue 6.6 mL 5  . gabapentin (NEURONTIN) 100 MG capsule Take 1 capsule (100 mg total) by mouth at bedtime. 30 capsule 5  . glucose blood test strip Based on insurance preference. Use as instructed once daily to check sugars. Dx E11.9 100 each 12  . GuaiFENesin (MUCINEX PO) Take 400 mg by mouth daily as needed for congestion.    . Lancets (ONETOUCH ULTRASOFT) lancets Use as instructed once daily to check sugars. Dx E11.9 100 each 12  . levothyroxine (SYNTHROID) 137 MCG tablet TAKE 1 TABLET DAILY BEFORE BREAKFAST. NEED OFFICE VISIT WITH LABS  30 tablet 0  . metFORMIN (GLUCOPHAGE) 500 MG tablet TAKE 1 TABLET TWICE DAILY WITH  MEALS 60 tablet 0  . montelukast (SINGULAIR) 10 MG tablet Take 1 tablet (10 mg total) by mouth at bedtime. 90 tablet 1  . Multiple Vitamins-Minerals (CENTRUM SILVER PO) Take 1 tablet by mouth daily.    Marland Kitchen Phenylephrine HCl (SINEX REGULAR NA) Place 1 spray into the nose daily as needed (ALLERGIES).    . promethazine (PHENERGAN) 12.5 MG tablet Take 1 tablet (12.5 mg total) by mouth every 6 (six) hours as needed for nausea or vomiting. 30 tablet 0  .  terbinafine (LAMISIL AT) 1 % cream Apply 1 application topically 2 (two) times daily. 42 g 2  . umeclidinium-vilanterol (ANORO ELLIPTA) 62.5-25 MCG/INH AEPB Inhale 1 puff daily into the lungs. 60 each 5   No current facility-administered medications for this visit.    PHYSICAL EXAMINATION: ECOG PERFORMANCE STATUS: 1 - Symptomatic but completely ambulatory  No vitals taken today, Exam not performed today   LABORATORY DATA:  I have reviewed the data as listed CBC Latest Ref Rng & Units 09/09/2019 07/26/2018 07/27/2017  WBC 4.0 - 10.5 K/uL 6.3 6.4 7.2  Hemoglobin 12.0 - 15.0 g/dL 12.2 12.2 12.5  Hematocrit 36 - 46 % 38.6 39.0 38.3  Platelets 150 - 400 K/uL 216 208 218     CMP Latest Ref Rng & Units 09/09/2019 07/26/2018 07/27/2017  Glucose 70 - 99 mg/dL 156(H) 135(H) 136  BUN 8 - 23 mg/dL $Remove'17 13 16  'rWSForg$ Creatinine 0.44 - 1.00 mg/dL 0.94 0.79 0.81  Sodium 135 - 145 mmol/L 140 139 139  Potassium 3.5 - 5.1 mmol/L 4.4 4.2 4.5  Chloride 98 - 111 mmol/L 107 105 106  CO2 22 - 32 mmol/L $RemoveB'25 25 25  'oeyOpgRT$ Calcium 8.9 - 10.3 mg/dL 8.9 8.8(L) 9.3  Total Protein 6.5 - 8.1 g/dL 7.1 7.2 7.2  Total Bilirubin 0.3 - 1.2 mg/dL 0.3 0.4 0.4  Alkaline Phos 38 - 126 U/L 148(H) 177(H) 190(H)  AST 15 - 41 U/L $Remo'17 16 23  'pJqKM$ ALT 0 - 44 U/L $Remo'19 19 29      'qwNcS$ RADIOGRAPHIC STUDIES: I have personally reviewed the radiological images as listed and agreed with the findings in the report. No results found.   ASSESSMENT & PLAN:  Jessica Farrell is a 74 y.o. female with     1. Well differentiated low-grade neuroendocrine tumor of terminal ileum (carcinoid tumor), pT3 N1 M0, stage IIIB -She was diagnosed in 01/2014. Her tumor has been completely resected. Surgical margins were negative. -There is no role of adjuvant chemotherapy for resected carcinoid tumor.  -We previously discussed the risks of tumor recurrence in the future, including late recurrence. -Her postop chromogranin A and urine 5 HIAA level were normal. Those were not  checked before surgery. -Her CT AP from 09/11/19 showed multiple right mesocolon LNs have increased in size, largest one from 1.5cm to 1.8cm which is nonspecific although concerning for nodal metastatic disease. -I personally reviewed and discussed her PET from 10/06/19 shows several small nodules in the peritoneal space and retroperitoneal space consistent with metastatic well differentiated neuroendocrine tumor. Radiotracer activity within mediastinal lymph nodes and bilateral pulmonary nodules consistent with metastatic well-differentiated neuroendocrine tumor to the lungs. Lastly scan also shows Several small liver lesions above background activity are indeterminate. Consider MRI liver with and without contrast. -I discussed biopsy to for definitive diagnosis,although this could be technically difficult, I will discuss with IR. She is agreeable.  -I discussed if found to be metastatic disease, this is not resectable given her diffuse disease. If her  disease is not resectable, it is no curable. I would recommend systemic treatment with monthly Sandostatin injections to control her disease.  -Will discuss this further with biopsy results.   2. HTN, DM, hypothyroidism, mild neuropathy  -She will continue medication and follow-up with her primary care physician. -She has mild tingling of LE from DM. She rarely uses Gabapentin as needed.   3.Cholecystitis status post colectomy in March 2019  4. Cancer screenings -03/2018 Mammogram unremarkable. 03/2018 DEXA shows osteopenia (lowest T-score -1.7 at left femur neck). Continue mammograms yearly.    Plan: -PET reviewed, concerning for metastatic disease.  -IR biopsy in 1-2 weeks if feasible, will discuss in GI conference next week - f/u after biopsy.    No problem-specific Assessment & Plan notes found for this encounter.   No orders of the defined types were placed in this encounter.  I discussed the assessment and treatment plan with the  patient. The patient was provided an opportunity to ask questions and all were answered. The patient agreed with the plan and demonstrated an understanding of the instructions.  The patient was advised to call back or seek an in-person evaluation if the symptoms worsen or if the condition fails to improve as anticipated.  The total time spent in the appointment was 25 minutes.    Truitt Merle, MD 10/08/2019   I, Joslyn Devon, am acting as scribe for Truitt Merle, MD.   I have reviewed the above documentation for accuracy and completeness, and I agree with the above.

## 2019-10-10 ENCOUNTER — Telehealth: Payer: Self-pay | Admitting: Hematology

## 2019-10-10 NOTE — Telephone Encounter (Signed)
No 8/18 los

## 2019-10-15 ENCOUNTER — Other Ambulatory Visit: Payer: Self-pay

## 2019-10-16 ENCOUNTER — Other Ambulatory Visit: Payer: Self-pay | Admitting: Hematology

## 2019-10-16 ENCOUNTER — Telehealth: Payer: Self-pay | Admitting: Hematology

## 2019-10-16 NOTE — Telephone Encounter (Signed)
Pt's case was discussed in GI conference yesterday, IR can try CT-guided peritoneal nodule biopsy for tissue diagnosis.  I tried to call patient, she did not answer and her mailbox was full.  I called her daughter Malachy Mood and left her message for her to call us back.  If patient is agreeable, will place order for biopsy, and I will see her back after biopsy.  Truitt Merle  10/16/2019

## 2019-10-17 ENCOUNTER — Other Ambulatory Visit: Payer: Self-pay | Admitting: Hematology

## 2019-10-17 ENCOUNTER — Encounter (HOSPITAL_COMMUNITY): Payer: Self-pay | Admitting: Radiology

## 2019-10-17 DIAGNOSIS — C7B8 Other secondary neuroendocrine tumors: Secondary | ICD-10-CM

## 2019-10-17 NOTE — Progress Notes (Signed)
Jessica Beighley "[name]" Female, 74 y.o., 1945-11-09 MRN:  [id number] Phone:  [phone] [name]) PCP:  Binnie Rail, MD Coverage:  [facility] Medicare/[employer] Hmo  RE: CT Biopsy Received: Today Arne Cleveland, MD  Jillyn Hidden [place]   CT core peritoneal nodule  PET 1cm 4:134 anterior approach   DDH       Previous Messages   ----- Message -----  From: Garth Bigness D  Sent: 10/17/2019 10:48 AM EDT  To: Ir Procedure Requests  Subject: CT Biopsy                     Procedure:  CT Biopsy   Reason: 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, peritoneal metastasis. need tissue confirmation   History: CT, NM PET in computer   Provider: Truitt Merle   Provider Contact: [phone]

## 2019-10-22 ENCOUNTER — Telehealth: Payer: Self-pay

## 2019-10-22 NOTE — Telephone Encounter (Signed)
Left vm for Ms Rhines to call me re: appt with Dr. Burr Medico to review bx results.  aptt for 9/9 at 0800.

## 2019-10-24 ENCOUNTER — Other Ambulatory Visit: Payer: Self-pay | Admitting: Radiology

## 2019-10-28 ENCOUNTER — Other Ambulatory Visit: Payer: Self-pay

## 2019-10-28 ENCOUNTER — Ambulatory Visit (HOSPITAL_COMMUNITY)
Admission: RE | Admit: 2019-10-28 | Discharge: 2019-10-28 | Disposition: A | Discharge status: Home / Self Care | Payer: Medicare HMO | Source: Ambulatory Visit | Attending: Hematology | Admitting: Hematology

## 2019-10-28 ENCOUNTER — Encounter (HOSPITAL_COMMUNITY): Payer: Self-pay

## 2019-10-28 DIAGNOSIS — E039 Hypothyroidism, unspecified: Secondary | ICD-10-CM | POA: Insufficient documentation

## 2019-10-28 DIAGNOSIS — Z8589 Personal history of malignant neoplasm of other organs and systems: Secondary | ICD-10-CM | POA: Diagnosis not present

## 2019-10-28 DIAGNOSIS — Z8509 Personal history of malignant neoplasm of other digestive organs: Secondary | ICD-10-CM | POA: Diagnosis not present

## 2019-10-28 DIAGNOSIS — C786 Secondary malignant neoplasm of retroperitoneum and peritoneum: Secondary | ICD-10-CM | POA: Diagnosis not present

## 2019-10-28 DIAGNOSIS — Z79890 Hormone replacement therapy (postmenopausal): Secondary | ICD-10-CM | POA: Insufficient documentation

## 2019-10-28 DIAGNOSIS — K668 Other specified disorders of peritoneum: Secondary | ICD-10-CM | POA: Diagnosis not present

## 2019-10-28 DIAGNOSIS — E785 Hyperlipidemia, unspecified: Secondary | ICD-10-CM | POA: Diagnosis not present

## 2019-10-28 DIAGNOSIS — Z803 Family history of malignant neoplasm of breast: Secondary | ICD-10-CM | POA: Diagnosis not present

## 2019-10-28 DIAGNOSIS — Z9049 Acquired absence of other specified parts of digestive tract: Secondary | ICD-10-CM | POA: Insufficient documentation

## 2019-10-28 DIAGNOSIS — K769 Liver disease, unspecified: Secondary | ICD-10-CM | POA: Insufficient documentation

## 2019-10-28 DIAGNOSIS — Z85040 Personal history of malignant carcinoid tumor of rectum: Secondary | ICD-10-CM | POA: Diagnosis not present

## 2019-10-28 DIAGNOSIS — Z8249 Family history of ischemic heart disease and other diseases of the circulatory system: Secondary | ICD-10-CM | POA: Insufficient documentation

## 2019-10-28 DIAGNOSIS — E119 Type 2 diabetes mellitus without complications: Secondary | ICD-10-CM | POA: Diagnosis not present

## 2019-10-28 DIAGNOSIS — C7B8 Other secondary neuroendocrine tumors: Secondary | ICD-10-CM

## 2019-10-28 DIAGNOSIS — Z79899 Other long term (current) drug therapy: Secondary | ICD-10-CM | POA: Insufficient documentation

## 2019-10-28 DIAGNOSIS — Z7984 Long term (current) use of oral hypoglycemic drugs: Secondary | ICD-10-CM | POA: Diagnosis not present

## 2019-10-28 DIAGNOSIS — C7A8 Other malignant neuroendocrine tumors: Secondary | ICD-10-CM | POA: Diagnosis not present

## 2019-10-28 LAB — CBC WITH DIFFERENTIAL/PLATELET
Abs Immature Granulocytes: 0.02 10*3/uL (ref 0.00–0.07)
Basophils Absolute: 0.1 10*3/uL (ref 0.0–0.1)
Basophils Relative: 1 %
Eosinophils Absolute: 0.3 10*3/uL (ref 0.0–0.5)
Eosinophils Relative: 5 %
HCT: 38.1 % (ref 36.0–46.0)
Hemoglobin: 12.2 g/dL (ref 12.0–15.0)
Immature Granulocytes: 0 %
Lymphocytes Relative: 28 %
Lymphs Abs: 1.7 10*3/uL (ref 0.7–4.0)
MCH: 30.2 pg (ref 26.0–34.0)
MCHC: 32 g/dL (ref 30.0–36.0)
MCV: 94.3 fL (ref 80.0–100.0)
Monocytes Absolute: 0.5 10*3/uL (ref 0.1–1.0)
Monocytes Relative: 8 %
Neutro Abs: 3.7 10*3/uL (ref 1.7–7.7)
Neutrophils Relative %: 58 %
Platelets: 189 10*3/uL (ref 150–400)
RBC: 4.04 MIL/uL (ref 3.87–5.11)
RDW: 13.3 % (ref 11.5–15.5)
WBC: 6.2 10*3/uL (ref 4.0–10.5)
nRBC: 0 % (ref 0.0–0.2)

## 2019-10-28 LAB — PROTIME-INR
INR: 0.9 (ref 0.8–1.2)
Prothrombin Time: 12.2 seconds (ref 11.4–15.2)

## 2019-10-28 LAB — GLUCOSE, CAPILLARY: Glucose-Capillary: 164 mg/dL — ABNORMAL HIGH (ref 70–99)

## 2019-10-28 IMAGING — CT CT BIOPSY
1 of 4 series · 13 of 32 positions shown, 18 images · non-contrast
Comparison: PET-CT-[DATE];

INDICATION: History of GI stromal tumor as well as small bowel carcinoid, now
with hypermetabolic peritoneal nodules worrisome for metastatic
disease. Please perform CT-guided biopsy for tissue diagnostic
purposes.

EXAM:
CT-GUIDED PERITONEAL NODULE BIOPSY

[Series 2: i-spiral 5.0 bf37 · axial · 0.98mm/px · z∈[+1116,+1347]mm · 13 of 76 slices shown, 18 images]
[im 5/76  soft-tissue]
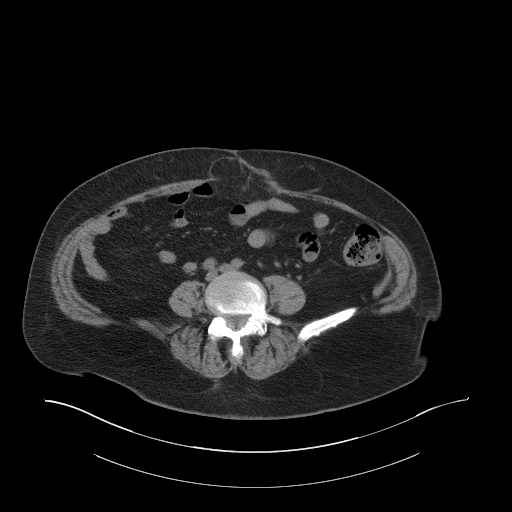
[im 5/76  bone]
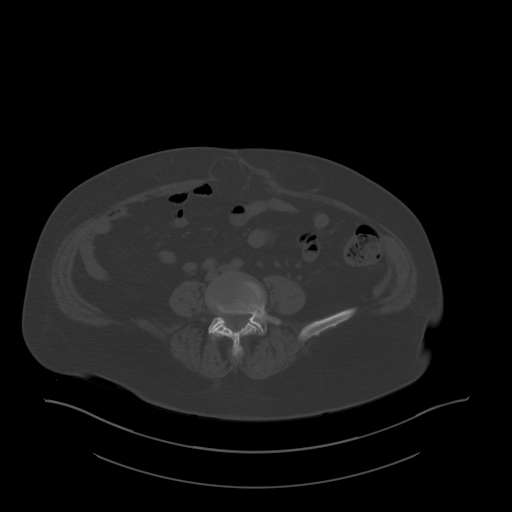
[im 10/76  soft-tissue]
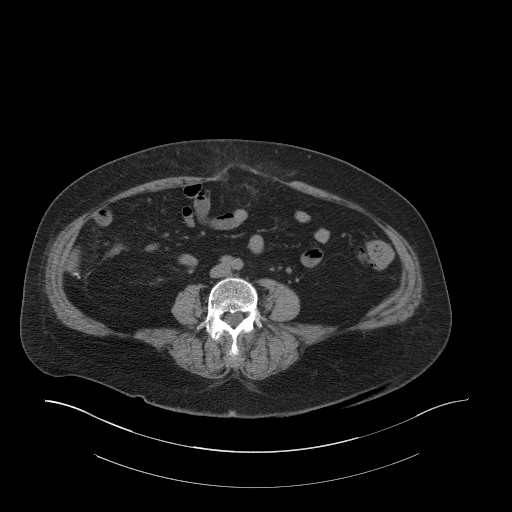
[im 19/76  soft-tissue]
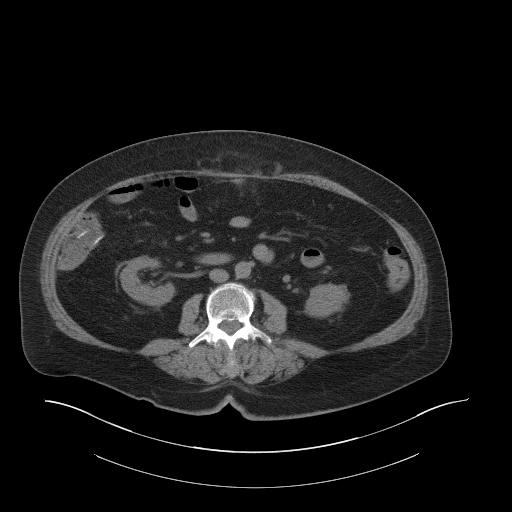
[im 24/76  soft-tissue]
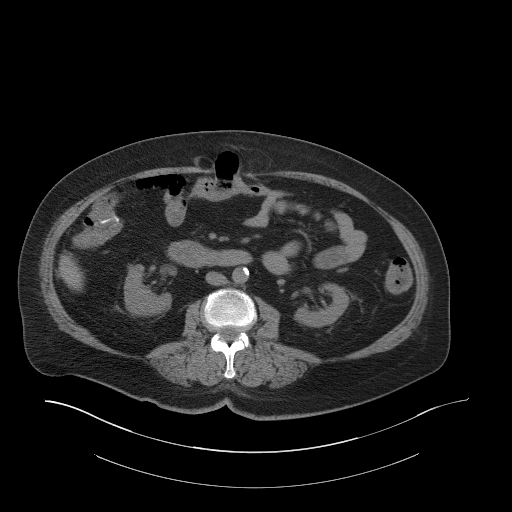
[im 29/76  soft-tissue]
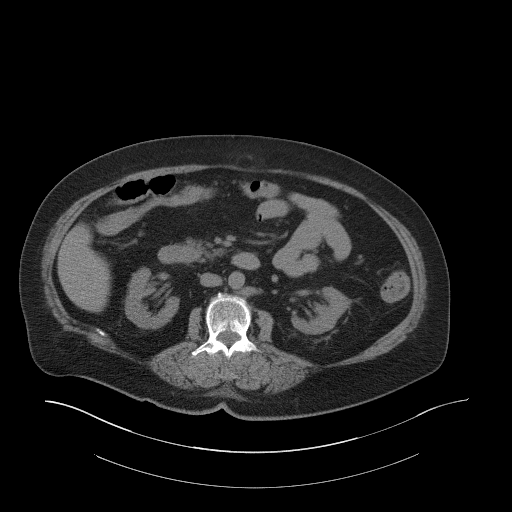
[im 33/76  soft-tissue]
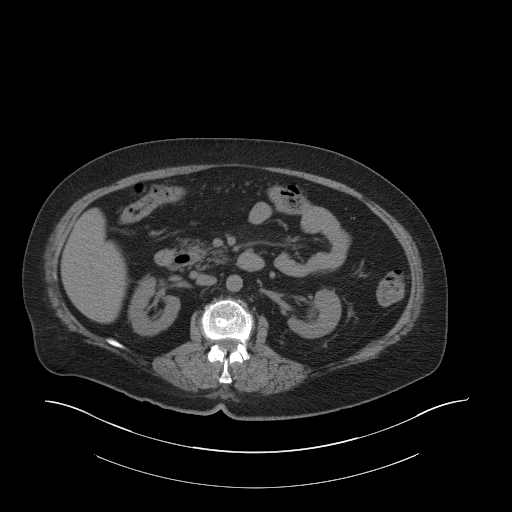
[im 43/76  soft-tissue]
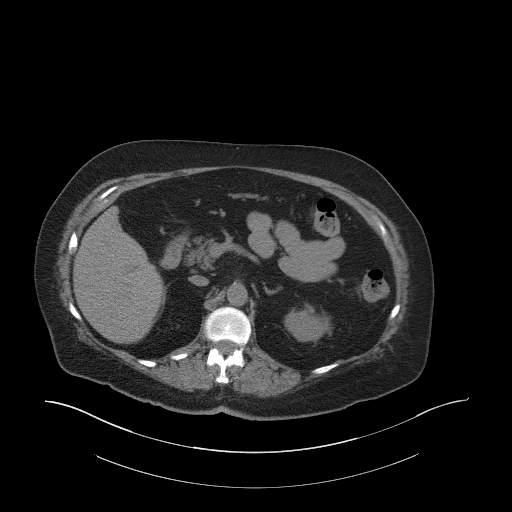
[im 47/76  soft-tissue]
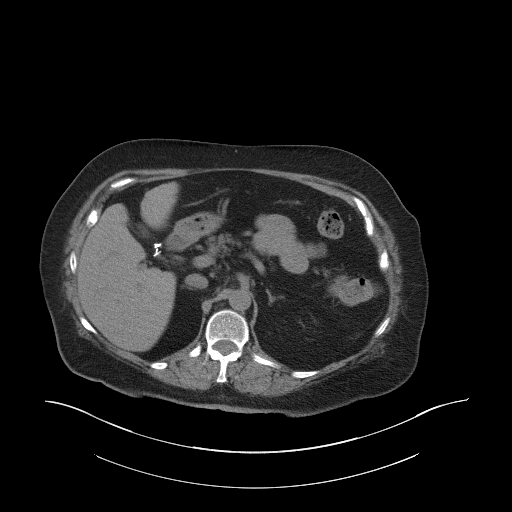
[im 52/76  soft-tissue]
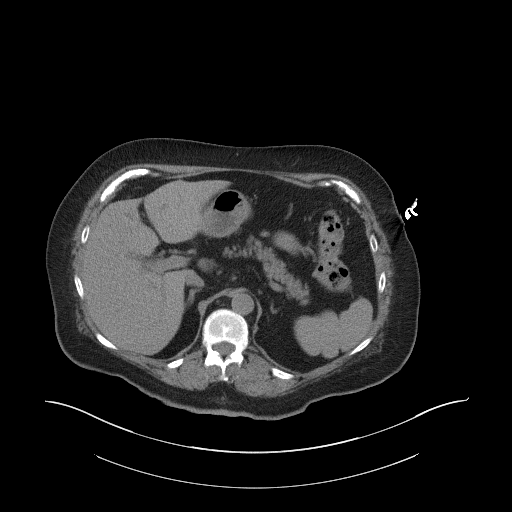
[im 52/76  bone]
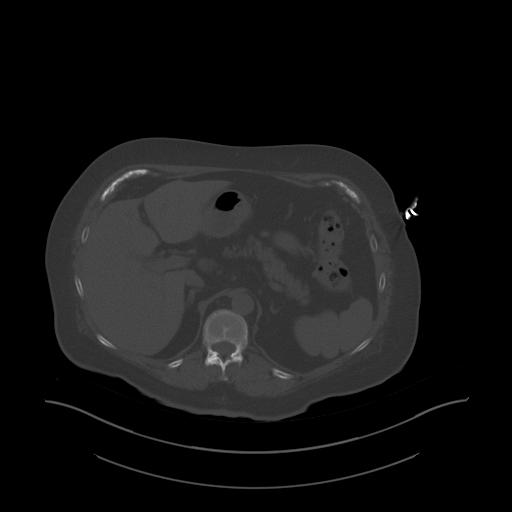
[im 57/76  soft-tissue]
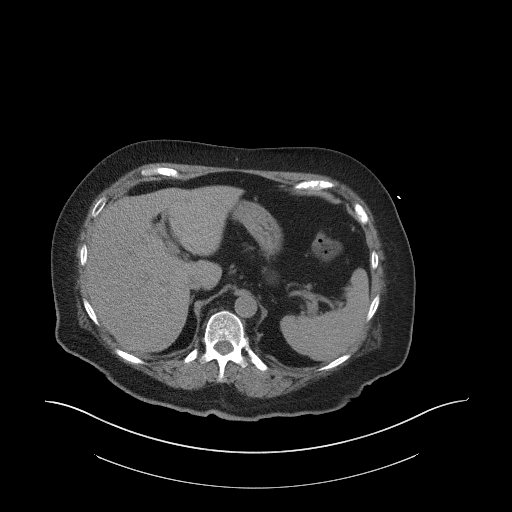
[im 57/76  lung]
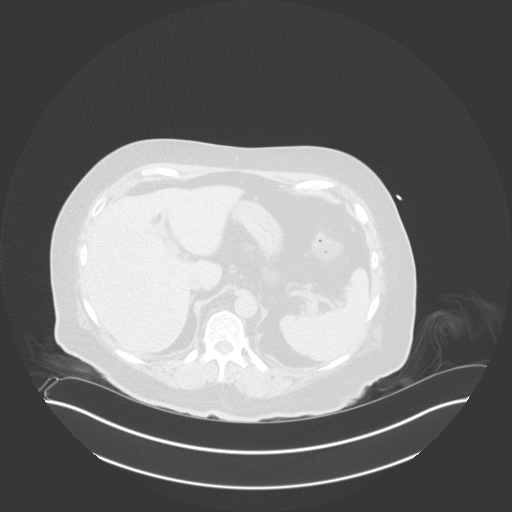
[im 61/76  lung]
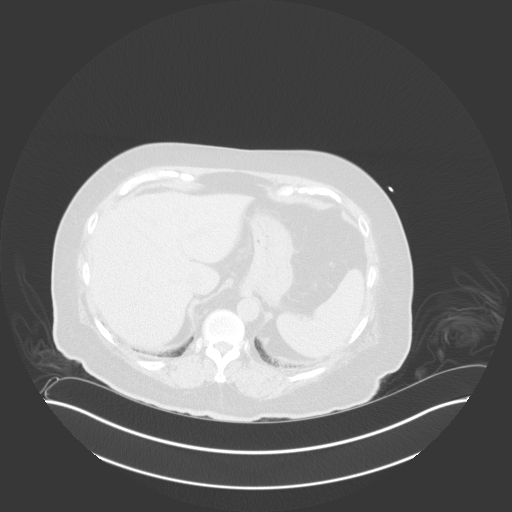
[im 66/76  soft-tissue]
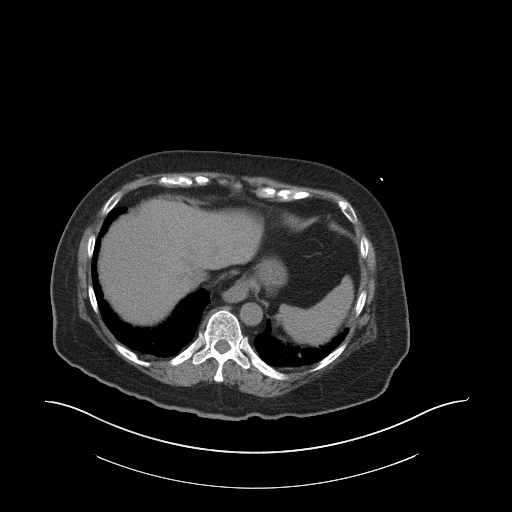
[im 66/76  lung]
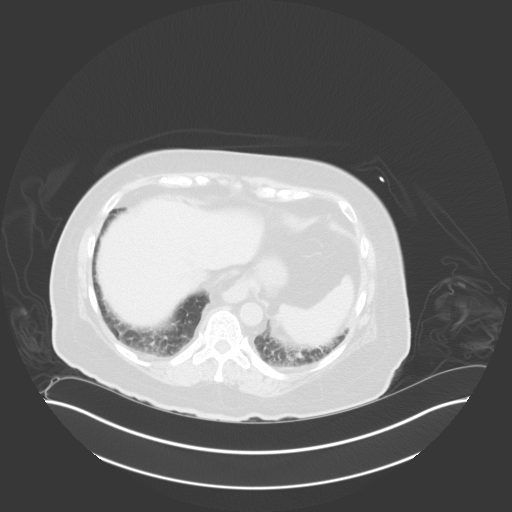
[im 71/76  soft-tissue]
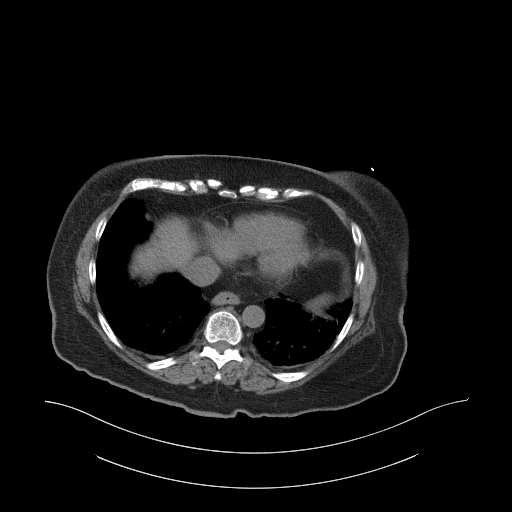
[im 71/76  lung]
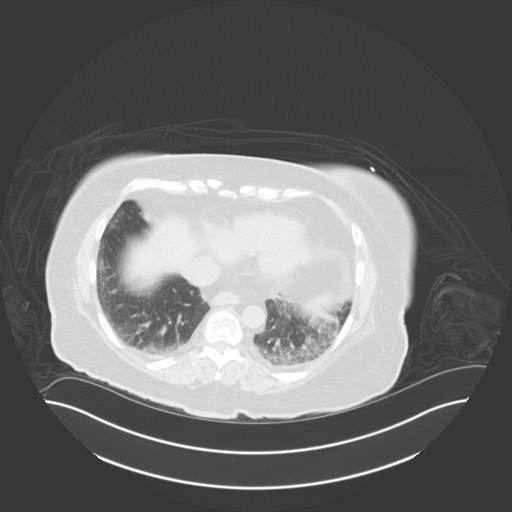

[13 of 32 positions shown; findings below may reference images not displayed]

CT abdomen pelvis-[DATE]

MEDICATIONS:
None.

ANESTHESIA/SEDATION:
Fentanyl 100 mcg IV; Versed 2 mg IV

Sedation time: 14 minutes; The patient was continuously monitored
during the procedure by the interventional radiology nurse under my
direct supervision.

CONTRAST:  None.

COMPLICATIONS:
None immediate.

PROCEDURE:
Informed consent was obtained from the patient following an
explanation of the procedure, risks, benefits and alternatives. A
time out was performed prior to the initiation of the procedure.

The patient was positioned supine on the CT table and a limited CT
was performed for procedural planning demonstrating unchanged size
and appearance of the dominant approximately 0.9 x 0.7 cm nodule
peritoneal nodule within the ventral aspect of the mid abdominal
peritoneum (image 60, series 2). The procedure was planned. The
operative site was prepped and draped in the usual sterile fashion.
Appropriate trajectory was confirmed with a 22 gauge spinal needle
after the adjacent tissues were anesthetized with 1% Lidocaine with
epinephrine.

Under intermittent CT guidance, a 17 gauge coaxial needle was
advanced into the peripheral aspect of the nodule.

Appropriate positioning was confirmed and 6 core needle biopsy
samples were obtained with an 18 gauge core needle biopsy device.
The co-axial needle was removed following administration of a
Gel-Foam slurry and superficial hemostasis was achieved with manual
compression.

A limited postprocedural CT was negative for hemorrhage or
additional complication. A dressing was placed. The patient
tolerated the procedure well without immediate postprocedural
complication.
IMPRESSION: Technically successful CT guided core needle biopsy of indeterminate
peritoneal nodule.

If this biopsy proves nondiagnostic secondary to the small size of
the nodule, would recommend proceeding with abdominal MRI to further
evaluate indeterminate liver lesions questioned preceding PET-CT.

## 2019-10-28 MED ORDER — SODIUM CHLORIDE 0.9 % IV SOLN: INTRAVENOUS | Status: DC

## 2019-10-28 MED ORDER — LIDOCAINE-EPINEPHRINE 1 %-1:100000 IJ SOLN
INTRAMUSCULAR | Status: AC | PRN
  Administered 2019-10-28: 10 mL

## 2019-10-28 MED ORDER — MIDAZOLAM HCL 2 MG/2ML IJ SOLN
INTRAMUSCULAR | Status: AC | PRN
  Administered 2019-10-28 (×2): 1 mg via INTRAVENOUS

## 2019-10-28 MED ORDER — MIDAZOLAM HCL 2 MG/2ML IJ SOLN
INTRAMUSCULAR | Status: AC
  Filled 2019-10-28: qty 4

## 2019-10-28 MED ORDER — FENTANYL CITRATE (PF) 100 MCG/2ML IJ SOLN
INTRAMUSCULAR | Status: AC
  Filled 2019-10-28: qty 2

## 2019-10-28 MED ORDER — FENTANYL CITRATE (PF) 100 MCG/2ML IJ SOLN
INTRAMUSCULAR | Status: AC | PRN
  Administered 2019-10-28 (×2): 50 ug via INTRAVENOUS

## 2019-10-28 NOTE — Consult Note (Signed)
Chief Complaint: Patient was seen in consultation today for image guided biopsy of peritoneal nodule  Referring Physician(s): Feng,Yan  Supervising Physician: Sandi Mariscal  Patient Status: [facility] - Out-pt  History of Present Illness: Jessica Farrell is a 74 y.o. female with history of metastatic malignant neuroendocrine tumor/carcinoid diagnosed in 2015 with prior lap assisted right colectomy and resection of distal ileum.  PET scan performed on 10/06/2019 revealed:  1. Several small nodules in the peritoneal space and retroperitoneal space with intense radiotracer activity consistent with metastatic well differentiated neuroendocrine tumor. 2. Several small liver lesions above background activity are indeterminate. Consider MRI liver with and without contrast. 3. Radiotracer activity within mediastinal lymph nodes and bilateral pulmonary nodules consistent with metastatic well-differentiated neuroendocrine tumor to the lungs. Activity is very low within these lesions but measurable. 4. No clear evidence skeletal metastasis. Single lesion in the posterior LEFT SI joint warrants attention on follow-up. 5. Although there are multiple sites of metastatic disease, the overall tumor burden of metastatic disease very small.  Patient presents today for image guided peritoneal nodule biopsy for further evaluation   Past Medical History:  Diagnosis Date  . Asthma   . Cellulitis March  2014   Left foot and ankle  . colon ca dx'd 01/2014  . Colon polyps    2015   . Diabetes mellitus ([place]) 05/05/2012   Type II  . Hyperlipidemia   . Morbid obesity ([place])   . Thyroid disease 1990's   HypoThyroidism    Past Surgical History:  Procedure Laterality Date  . CHOLECYSTECTOMY N/A 05/02/2017   Procedure: LAPAROSCOPIC CHOLECYSTECTOMY WITH INTRAOPERATIVE CHOLANGIOGRAM, VENTRAL HERNIA REPAIR;  Surgeon: Jovita Kussmaul, MD;  Location: [facility] ORS;  Service: General;  Laterality: N/A;  . COLON  RESECTION N/A 02/03/2014   Procedure: LAPAROSCOPIC ASSISTED BOWEL RESECTION;  Surgeon: Jackolyn Confer, MD;  Location: [facility] ORS;  Service: General;  Laterality: N/A;  . FRACTURE SURGERY  2000   Ankle  . TOOTH EXTRACTION     wisdom Teeth    Allergies: Penicillins  Medications: Prior to Admission medications   Medication Sig Start Date End Date Taking? Authorizing Provider  albuterol (PROVENTIL HFA;VENTOLIN HFA) 108 (90 Base) MCG/ACT inhaler Inhale 2 puffs into the lungs every 6 (six) hours as needed for wheezing or shortness of breath. 05/23/18   Binnie Rail, MD  atorvastatin (LIPITOR) 10 MG tablet Take 1 tablet (10 mg total) by mouth daily. Need office visit for more refills. 05/16/19   Binnie Rail, MD  Bismuth Subsalicylate (PEPTO-BISMOL PO) Take 2 tablets by mouth 2 (two) times daily as needed ([place]).    [provider]  blood glucose meter kit and supplies KIT Dispense based on patient and insurance preference. Use up to 2 times daily as directed. DX code: E11.9 06/10/18   Binnie Rail, MD  ciclopirox (PENLAC) 8 % solution Apply topically at bedtime. Apply over nail & surrounding skin. Apply daily over previous coat. After 7 days, may remove w alcohol &continue 05/22/17   Binnie Rail, MD  gabapentin (NEURONTIN) 100 MG capsule Take 1 capsule (100 mg total) by mouth at bedtime. 11/21/17   Binnie Rail, MD  glucose blood test strip Based on insurance preference. Use as instructed once daily to check sugars. Dx E11.9 06/04/18   Binnie Rail, MD  GuaiFENesin (MUCINEX PO) Take 400 mg by mouth daily as needed for congestion. 04/12/17   [provider]  Lancets (ONETOUCH ULTRASOFT) lancets Use as  instructed once daily to check sugars. Dx E11.9 05/23/18   Binnie Rail, MD  levothyroxine (SYNTHROID) 137 MCG tablet TAKE 1 TABLET DAILY BEFORE BREAKFAST. NEED OFFICE VISIT WITH LABS  03/28/19   Binnie Rail, MD  metFORMIN (GLUCOPHAGE) 500 MG tablet TAKE 1 TABLET TWICE DAILY  WITH MEALS 08/28/19   Burns, Claudina Lick, MD  montelukast (SINGULAIR) 10 MG tablet Take 1 tablet (10 mg total) by mouth at bedtime. 04/12/17   Binnie Rail, MD  Multiple Vitamins-Minerals (CENTRUM SILVER PO) Take 1 tablet by mouth daily.    [provider]  Phenylephrine HCl (SINEX REGULAR NA) Place 1 spray into the nose daily as needed (ALLERGIES).    [provider]  promethazine (PHENERGAN) 12.5 MG tablet Take 1 tablet (12.5 mg total) by mouth every 6 (six) hours as needed for nausea or vomiting. 02/25/19   Janith Lima, MD  terbinafine (LAMISIL AT) 1 % cream Apply 1 application topically 2 (two) times daily. 05/22/17   Burns, Claudina Lick, MD  umeclidinium-vilanterol (ANORO ELLIPTA) 62.5-25 MCG/INH AEPB Inhale 1 puff daily into the lungs. 01/05/17   Binnie Rail, MD     Family History  Problem Relation Age of Onset  . Alzheimer's disease Father   . Heart disease Mother   . Arthritis Mother   . Hypertension Mother   . Alzheimer's disease Sister   . Cancer Sister 10       breast cancer   . Heart disease Brother        Heart Disease before age 44  . Breast cancer Sister   . Colon cancer Neg Hx   . Esophageal cancer Neg Hx   . Rectal cancer Neg Hx   . Stomach cancer Neg Hx     Social History   Socioeconomic History  . Marital status: Divorced    Spouse name: Not on file  . Number of children: Not on file  . Years of education: Not on file  . Highest education level: Not on file  Occupational History  . Not on file  Tobacco Use  . Smoking status: Never Smoker  . Smokeless tobacco: Never Used  Vaping Use  . Vaping Use: Never used  Substance and Sexual Activity  . Alcohol use: No  . Drug use: No  . Sexual activity: Not Currently    Birth control/protection: Post-menopausal  Other Topics Concern  . Not on file  Social History Narrative  . Not on file   Social Determinants of Health   Financial Resource Strain:   . Difficulty of Paying Living Expenses: Not  on file  Food Insecurity:   . Worried About Charity fundraiser in the Last Year: Not on file  . Ran Out of Food in the Last Year: Not on file  Transportation Needs:   . Lack of Transportation (Medical): Not on file  . Lack of Transportation (Non-Medical): Not on file  Physical Activity:   . Days of Exercise per Week: Not on file  . Minutes of Exercise per Session: Not on file  Stress:   . Feeling of Stress : Not on file  Social Connections:   . Frequency of Communication with Friends and Family: Not on file  . Frequency of Social Gatherings with Friends and Family: Not on file  . Attends Religious Services: Not on file  . Active Member of Clubs or Organizations: Not on file  . Attends Archivist Meetings: Not on file  . Marital  Status: Not on file      Review of Systems currently denies fever, headache, chest pain, dyspnea, nausea, vomiting or bleeding.  Does have occasional cough as well as intermittent abdominal and back discomfort  Vital Signs: BP 140/75   Pulse (!) 52   Temp 98.1 F (36.7 C) (Oral)   Resp 18   SpO2 99%   Physical Exam awake, alert.  Chest clear to auscultation bilaterally anteriorly.  Heart with slightly bradycardic but regular rhythm.  Abdomen soft, positive bowel sounds, some mild diffuse tenderness to palpation.  Some edema left lower extremity noted.  Imaging: NM PET (NETSPOT GA 28 DOTATATE) SKULL BASE TO MID THIGH  Result Date: 10/06/2019 CLINICAL DATA:  Gastrointestinal stromal tumor. Patient status post small bowel carcinoid with resection of the terminal ileum and reanastomosis. Nodular peritoneal implants noted on recent CT scan. EXAM: NUCLEAR MEDICINE PET SKULL BASE TO THIGH TECHNIQUE: 4.73 mCi Ga 79 DOTATATE was injected intravenously. Full-ring PET imaging was performed from the skull base to thigh after the radiotracer. CT data was obtained and used for attenuation correction and anatomic localization. COMPARISON:  None. FINDINGS:  NECK No radiotracer activity in neck lymph nodes. Incidental CT findings: None CHEST Mild radiotracer activity associated with a subcarinal lymph node measuring 7 mm with SUV max equal 4.4. Similar activity of a RIGHT hilar lymph node. Very small subpleural nodule in the medial RIGHT upper lobe along the mediastinal surface measures 4 mm (image 59/4) with mild metabolic activity (SUV max equal 3.1). Along the horizontal fissure of the RIGHT middle lobe 6 mm nodule (image 71/4) has very faint radiotracer activity with SUV max equal 1.0. Additional nodule in the LEFT lower lobe measuring 6 mm image 82 also has faint radiotracer activity with SUV max equal 2.1. Incidental CT finding:None ABDOMEN/PELVIS Post ileocecal resection with reanastomosis. NO radiotracer activity along the anastomosis. Several small peritoneal implants do have radiotracer activity. For example ventral peritoneal nodule inferior to the transverse colon measuring 10 mm (image 134/4) with SUV max equal 10.3. Small nodule adjacent to the head of the pancreas measures 5 mm on image 119/4 with SUV max equal 14.8. There are several foci of radiotracer within liver that are elevated above background physiologic liver activity. These are small and difficult to characterize. For example approximately 1 cm lesion within the central RIGHT hepatic lobe SUV max equal 11.4 on image 116 fused data set. Small lesion in the LEFT hepatic lobe just above background on image 106. Physiologic activity noted in the liver, spleen, adrenal glands and kidneys. Incidental CT findings: SKELETON Single focus of radiotracer activity along the posterior aspect of the LEFT SI joint with SUV max equal 4.3 is indeterminate. Incidental CT findings:None IMPRESSION: 1. Several small nodules in the peritoneal space and retroperitoneal space with intense radiotracer activity consistent with metastatic well differentiated neuroendocrine tumor. 2. Several small liver lesions above  background activity are indeterminate. Consider MRI liver with and without contrast. 3. Radiotracer activity within mediastinal lymph nodes and bilateral pulmonary nodules consistent with metastatic well-differentiated neuroendocrine tumor to the lungs. Activity is very low within these lesions but measurable. 4. No clear evidence skeletal metastasis. Single lesion in the posterior LEFT SI joint warrants attention on follow-up. 5. Although there are multiple sites of metastatic disease, the overall tumor burden of metastatic disease very small. Electronically Signed   By: Suzy Bouchard M.D.   On: 10/06/2019 11:01    Labs:  CBC: Recent Labs    09/09/19 1016  WBC 6.3  HGB 12.2  HCT 38.6  PLT 216    COAGS: No results for input(s): INR, APTT in the last 8760 hours.  BMP: Recent Labs    09/09/19 1016  NA 140  K 4.4  CL 107  CO2 25  GLUCOSE 156*  BUN 17  CALCIUM 8.9  CREATININE 0.94  GFRNONAA 60*  GFRAA >60    LIVER FUNCTION TESTS: Recent Labs    09/09/19 1016  BILITOT 0.3  AST 17  ALT 19  ALKPHOS 148*  PROT 7.1  ALBUMIN 3.5    TUMOR MARKERS: No results for input(s): AFPTM, CEA, CA199, CHROMGRNA in the last 8760 hours.  Assessment and Plan: 74 y.o. female with history of metastatic malignant neuroendocrine tumor/carcinoid diagnosed in 2015 with prior lap assisted right colectomy and resection of distal ileum.  PET scan performed on 10/06/2019 revealed:  1. Several small nodules in the peritoneal space and retroperitoneal space with intense radiotracer activity consistent with metastatic well differentiated neuroendocrine tumor. 2. Several small liver lesions above background activity are indeterminate. Consider MRI liver with and without contrast. 3. Radiotracer activity within mediastinal lymph nodes and bilateral pulmonary nodules consistent with metastatic well-differentiated neuroendocrine tumor to the lungs. Activity is very low within these lesions but  measurable. 4. No clear evidence skeletal metastasis. Single lesion in the posterior LEFT SI joint warrants attention on follow-up. 5. Although there are multiple sites of metastatic disease, the overall tumor burden of metastatic disease very small.  Patient presents today for image guided peritoneal nodule biopsy for further evaluation.Risks and benefits of procedure was discussed with the patient  including, but not limited to bleeding, infection, damage to adjacent structures or low yield requiring additional tests.  All of the questions were answered and there is agreement to proceed.  Consent signed and in chart.     Thank you for this interesting consult.  I greatly enjoyed meeting Jessica Farrell and look forward to participating in their care.  A copy of this report was sent to the requesting provider on this date.  Electronically Signed: D. Rowe Robert, PA-C 10/28/2019, 10:00 AM   I spent a total of  25 minutes   in face to face in clinical consultation, greater than 50% of which was counseling/coordinating care for image guided peritoneal nodule biopsy

## 2019-10-28 NOTE — Discharge Instructions (Signed)
Please call Interventional Radiology clinic [phone] with any questions or concerns.  You may remove your dressing and shower tomorrow.   Needle Biopsy, Care After These instructions tell you how to care for yourself after your procedure. Your doctor may also give you more specific instructions. Call your doctor if you have any problems or questions. What can I expect after the procedure? After the procedure, it is common to have:  Soreness.  Bruising.  Mild pain. Follow these instructions at home:   Return to your normal activities as told by your doctor. Ask your doctor what activities are safe for you.  Take over-the-counter and prescription medicines only as told by your doctor.  Wash your hands with soap and water before you change your bandage (dressing). If you cannot use soap and water, use hand sanitizer.  Follow instructions from your doctor about: ? How to take care of your puncture site. ? When and how to change your bandage. ? When to remove your bandage.  Check your puncture site every day for signs of infection. Watch for: ? Redness, swelling, or pain. ? Fluid or blood. ? Pus or a bad smell. ? Warmth.  Do not take baths, swim, or use a hot tub until your doctor approves. Ask your doctor if you may take showers. You may only be allowed to take sponge baths.  Keep all follow-up visits as told by your doctor. This is important. Contact a doctor if you have:  A fever.  Redness, swelling, or pain at the puncture site, and it lasts longer than a few days.  Fluid, blood, or pus coming from the puncture site.  Warmth coming from the puncture site. Get help right away if:  You have a lot of bleeding from the puncture site. Summary  After the procedure, it is common to have soreness, bruising, or mild pain at the puncture site.  Check your puncture site every day for signs of infection, such as redness, swelling, or pain.  Get help right away if you  have severe bleeding from your puncture site. This information is not intended to replace advice given to you by your health care provider. Make sure you discuss any questions you have with your health care provider. Document Revised: 02/19/2017 Document Reviewed: 02/19/2017 Elsevier Patient Education  2020 [employer]   Moderate Conscious Sedation, Adult, Care After These instructions provide you with information about caring for yourself after your procedure. Your health care provider may also give you more specific instructions. Your treatment has been planned according to current medical practices, but problems sometimes occur. Call your health care provider if you have any problems or questions after your procedure. What can I expect after the procedure? After your procedure, it is common:  To feel sleepy for several hours.  To feel clumsy and have poor balance for several hours.  To have poor judgment for several hours.  To vomit if you eat too soon. Follow these instructions at home: For at least 24 hours after the procedure:   Do not: ? Participate in activities where you could fall or become injured. ? Drive. ? Use heavy machinery. ? Drink alcohol. ? Take sleeping pills or medicines that cause drowsiness. ? Make important decisions or sign legal documents. ? Take care of children on your own.  Rest. Eating and drinking  Follow the diet recommended by your health care provider.  If you vomit: ? Drink water, juice, or soup when you can drink without vomiting. ?   Make sure you have little or no nausea before eating solid foods. General instructions  Have a responsible adult stay with you until you are awake and alert.  Take over-the-counter and prescription medicines only as told by your health care provider.  If you smoke, do not smoke without supervision.  Keep all follow-up visits as told by your health care provider. This is important. Contact a health care  provider if:  You keep feeling nauseous or you keep vomiting.  You feel light-headed.  You develop a rash.  You have a fever. Get help right away if:  You have trouble breathing. This information is not intended to replace advice given to you by your health care provider. Make sure you discuss any questions you have with your health care provider. Document Revised: 01/19/2017 Document Reviewed: 05/29/2015 Elsevier Patient Education  2020 [employer]   

## 2019-10-28 NOTE — Procedures (Signed)
Pre procedural Dx: Peritoneal nodule  Post procedural Dx: Same  Technically successful CT guided biopsy of indeterminate peritoneal nodule within the ventral aspect of the abdomen.   EBL: None.  Complications: None immediate.   Ronny Bacon, MD Pager #: [phone]

## 2019-10-29 NOTE — Progress Notes (Signed)
[facility]   Telephone:([phone]) [phone] Fax:(336) [phone]   Clinic Follow up Note   Patient Care Team: Pincus Sanes, MD as PCP - General (Internal Medicine) Malachy Mood, MD as Consulting Physician (Hematology) Avel Peace, MD as Consulting Physician (General Surgery)  Date of Service:  10/30/2019  CHIEF COMPLAINT: Follow up carcinoid tumor  SUMMARY OF ONCOLOGIC HISTORY: Oncology History Overview Note  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   Staging form: Colon and Rectum, AJCC 7th Edition     Clinical: T3, N2, M0 - Unsigned     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 (HCC)  01/21/2014 Imaging   CT: Distal small bowel obstruction due to 3.4 cm soft tissue mass near the ileocecal valve, with adjacent right lower quadrant mesenteric lymphadenopathy   02/03/2014 Pathologic Stage   invasive well diff neuroendocrine tumor (carcinoid), 3cm, pT3pN2 (5/25 nodes positive). negative margines.   02/03/2014 Surgery   Laparoscopic-assisted right colectomy and resection of distal ileum   02/03/2014 Initial Diagnosis   Metastatic malignant neuroendocrine tumor of the ileocecal valve to regional lymph nodes.    05/31/2015 Imaging   CT A/P w contrast  IMPRESSION: 1. No evidence of metastatic disease. 2. Question mild basilar subpleural pulmonary fibrosis.   09/11/2019 Imaging   CT AP W contrast    IMPRESSION: 1. Redemonstrated postoperative findings of ileocolectomy and reanastomosis. 2. There are prominent lymph nodes in the right mesocolon which are slightly increased in size compared to prior examination, the largest node measuring 1.8 x 0.7 cm, previously 1.5 x 0.4 cm. Findings are nonspecific although concerning for nodal metastatic disease. 3. There are additional new small nodules of the transverse mesocolon or omentum measuring up to 9 mm, likewise concerning for nodal or omental metastatic disease. 4. Redemonstrated  broad-based midline, fat containing ventral hernia components. 5. Fluid in the endometrial cavity, abnormal in the late postmenopausal setting although unchanged compared to prior examination. 6. Status post interval cholecystectomy. 7. Aortic Atherosclerosis (ICD10-I70.0).   10/06/2019 PET scan   IMPRESSION: 1. Several small nodules in the peritoneal space and retroperitoneal space with intense radiotracer activity consistent with metastatic well differentiated neuroendocrine tumor. 2. Several small liver lesions above background activity are indeterminate. Consider MRI liver with and without contrast. 3. Radiotracer activity within mediastinal lymph nodes and bilateral pulmonary nodules consistent with metastatic well-differentiated neuroendocrine tumor to the lungs. Activity is very low within these lesions but measurable. 4. No clear evidence skeletal metastasis. Single lesion in the posterior LEFT SI joint warrants attention on follow-up. 5. Although there are multiple sites of metastatic disease, the overall tumor burden of metastatic disease very small.      CURRENT THERAPY:  Surveillance   INTERVAL HISTORY:  Jessica Farrell is here for a follow up of carcinoid tumor. She presents to the clinic alone. She is clinically stable, no new complains  Still has diarrhea, taking imodium once daily, BM 3-4 times daily  No pain, bloating, or other (+) abdominal gasy feeling intermittently  No weight loss, appetite good  ROS otherwise negative    MEDICAL HISTORY:  Past Medical History:  Diagnosis Date  . Asthma   . Cellulitis March  2014   Left foot and ankle  . colon ca dx'd 01/2014  . Colon polyps    2015   . Diabetes mellitus (HCC) 05/05/2012   Type II  . Hyperlipidemia   . Morbid obesity (HCC)   . Thyroid disease 1990's   HypoThyroidism    SURGICAL  HISTORY: Past Surgical History:  Procedure Laterality Date  . CHOLECYSTECTOMY N/A 05/02/2017   Procedure:  LAPAROSCOPIC CHOLECYSTECTOMY WITH INTRAOPERATIVE CHOLANGIOGRAM, VENTRAL HERNIA REPAIR;  Surgeon: Griselda Miner, MD;  Location: [facility];  Service: General;  Laterality: N/A;  . COLON RESECTION N/A 02/03/2014   Procedure: LAPAROSCOPIC ASSISTED BOWEL RESECTION;  Surgeon: Avel Peace, MD;  Location: [facility];  Service: General;  Laterality: N/A;  . FRACTURE SURGERY  2000   Ankle  . TOOTH EXTRACTION     [employer]isdom Teeth    I have reviewed the social history and family history with the patient and they are unchanged from previous note.  ALLERGIES:  is allergic to penicillins.  MEDICATIONS:  Current Outpatient Medications  Medication Sig Dispense Refill  . albuterol (PROVENTIL HFA;VENTOLIN HFA) 108 (90 Base) MCG/ACT inhaler Inhale 2 puffs into the lungs every 6 (six) hours as needed for wheezing or shortness of breath. 1 Inhaler 8  . atorvastatin (LIPITOR) 10 MG tablet Take 1 tablet (10 mg total) by mouth daily. Need office visit for more refills. 30 tablet 0  . blood glucose meter kit and supplies KIT Dispense based on patient and insurance preference. Use up to 2 times daily as directed. DX code: E11.9 1 each 0  . ciclopirox (PENLAC) 8 % solution Apply topically at bedtime. Apply over nail & surrounding skin. Apply daily over previous coat. After 7 days, may remove w alcohol &continue 6.6 mL 5  . gabapentin (NEURONTIN) 100 MG capsule Take 1 capsule (100 mg total) by mouth at bedtime. 30 capsule 5  . glucose blood test strip Based on insurance preference. Use as instructed once daily to check sugars. Dx E11.9 100 each 12  . Lancets (ONETOUCH ULTRASOFT) lancets Use as instructed once daily to check sugars. Dx E11.9 100 each 12  . levothyroxine (SYNTHROID) 137 MCG tablet TAKE 1 TABLET DAILY BEFORE BREAKFAST. NEED OFFICE VISIT WITH LABS  30 tablet 0  . loperamide (IMODIUM) 1 MG/5ML solution Take by mouth as needed for diarrhea or loose stools.    . metFORMIN (GLUCOPHAGE) 500 MG tablet TAKE 1 TABLET  TWICE DAILY WITH MEALS 60 tablet 0  . montelukast (SINGULAIR) 10 MG tablet Take 1 tablet (10 mg total) by mouth at bedtime. 90 tablet 1  . Multiple Vitamins-Minerals (CENTRUM SILVER PO) Take 1 tablet by mouth daily.    Marland Kitchen Phenylephrine HCl (SINEX REGULAR NA) Place 1 spray into the nose daily as needed (ALLERGIES).    . promethazine (PHENERGAN) 12.5 MG tablet Take 1 tablet (12.5 mg total) by mouth every 6 (six) hours as needed for nausea or vomiting. 30 tablet 0  . terbinafine (LAMISIL AT) 1 % cream Apply 1 application topically 2 (two) times daily. 42 g 2  . umeclidinium-vilanterol (ANORO ELLIPTA) 62.5-25 MCG/INH AEPB Inhale 1 puff daily into the lungs. 60 each 5   No current facility-administered medications for this visit.    PHYSICAL EXAMINATION: ECOG PERFORMANCE STATUS: 0 - Asymptomatic  Vitals:   10/30/19 1152 10/30/19 1211  BP: (!) 82/47 129/76  Pulse:  85  Resp:  16  Temp:  98.2 F (36.8 C)  SpO2:  98%   Filed Weights   10/30/19 1211  Weight: 184 lb 12.8 oz (83.8 kg)    GENERAL:alert, no distress and comfortable SKIN: skin color, texture, turgor are normal, no rashes or significant lesions EYES: normal, Conjunctiva are pink and non-injected, sclera clear NECK: supple, thyroid normal size, non-tender, without nodularity LYMPH:  no palpable lymphadenopathy in the  cervical, axillary  LUNGS: clear to auscultation and percussion with normal breathing effort HEART: regular rate & rhythm and no murmurs and no lower extremity edema ABDOMEN:abdomen soft, non-tender and normal bowel sounds Musculoskeletal:no cyanosis of digits and no clubbing  NEURO: alert & oriented x 3 with fluent speech, no focal motor/sensory deficits  LABORATORY DATA:  I have reviewed the data as listed CBC Latest Ref Rng & Units 10/28/2019 09/09/2019 07/26/2018  WBC 4.0 - 10.5 K/uL 6.2 6.3 6.4  Hemoglobin 12.0 - 15.0 g/dL 12.2 12.2 12.2  Hematocrit 36 - 46 % 38.1 38.6 39.0  Platelets 150 - 400 K/uL 189 216  208     CMP Latest Ref Rng & Units 09/09/2019 07/26/2018 07/27/2017  Glucose 70 - 99 mg/dL 156(H) 135(H) 136  BUN 8 - 23 mg/dL $Remove'17 13 16  'sQFVzSD$ Creatinine 0.44 - 1.00 mg/dL 0.94 0.79 0.81  Sodium 135 - 145 mmol/L 140 139 139  Potassium 3.5 - 5.1 mmol/L 4.4 4.2 4.5  Chloride 98 - 111 mmol/L 107 105 106  CO2 22 - 32 mmol/L $RemoveB'25 25 25  'DfxfnVLy$ Calcium 8.9 - 10.3 mg/dL 8.9 8.8(L) 9.3  Total Protein 6.5 - 8.1 g/dL 7.1 7.2 7.2  Total Bilirubin 0.3 - 1.2 mg/dL 0.3 0.4 0.4  Alkaline Phos 38 - 126 U/L 148(H) 177(H) 190(H)  AST 15 - 41 U/L $Remo'17 16 23  'uSwud$ ALT 0 - 44 U/L $Remo'19 19 29      'dKRJP$ RADIOGRAPHIC STUDIES: I have personally reviewed the radiological images as listed and agreed with the findings in the report. No results found.   ASSESSMENT & PLAN:  Jessica Farrell is a 74 y.o. female with   1. Well differentiated low-grade neuroendocrine tumor of terminal ileum (carcinoid tumor), pT3 N1 M0, stage IIIB, peritoneal metastasis 09/2019 -She was initially diagnosed in 01/2014. Her tumor has been completely resected. Surgical margins were negative. -She is on surveillance, clinically doing well. -Her CT AP from 09/11/19 showedmultipleright mesocolon LNs have increased in size,largest onefrom 1.5cm to 1.8cm which is nonspecific although concerning for nodal metastatic disease. -Her PET from 10/06/19 shows  metastatic disease to peritoneum, abdominal and mediastinal lymph nodes, and the lungs.  Indeterminate liver lesions. -I discussed her biopsy from 10/29/19 which shows well-differentiated neuroendocrine tumor with pt, and gave her a copy of the report. -We discussed her metastatic disease is not curable at this stage, due to diffuse metastasis, although the tumor burden is low. -I discussed the nature history of metastatic neuroendocrine tumor, and treatment options.  I recommend first-line therapy with Sandostatin injection.  Benefit and the potential side effects discussed with patient, she is interested -Plan to start  next week, and continue monthly -We will monitor lab  2. HTN, DM, hypothyroidism, mild neuropathy  -She will continue medication and follow-up with her primary care physician. -She has mild tingling of LE from DM. She rarely uses Gabapentin as needed.   3.Cholecystitis status post colectomy in March 2019    Plan: -Biopsy results discussed with patient, which confirms metastatic carcinoid tumor. -Plan to start Sandostatin injection next week, first dose is 20 mg.  If she tolerates well, will increase to 30 mg monthly from second dose. -f/u in 3 months -lab in 2 months    No problem-specific Assessment & Plan notes found for this encounter.   No orders of the defined types were placed in this encounter.  All questions were answered. The patient knows to call the clinic with any problems, questions or concerns. No barriers  to learning was detected. The total time spent in the appointment was 30 minutes.     Truitt Merle, MD 10/30/2019   I, Joslyn Devon, am acting as scribe for Truitt Merle, MD.   I have reviewed the above documentation for accuracy and completeness, and I agree with the above.

## 2019-10-30 ENCOUNTER — Encounter: Payer: Self-pay | Admitting: Hematology

## 2019-10-30 ENCOUNTER — Inpatient Hospital Stay: Payer: Medicare HMO | Attending: Hematology | Admitting: Hematology

## 2019-10-30 ENCOUNTER — Other Ambulatory Visit: Payer: Self-pay

## 2019-10-30 VITALS — BP 129/76 | HR 85 | Temp 98.2°F | Resp 16 | Ht 65.0 in | Wt 184.8 lb

## 2019-10-30 DIAGNOSIS — C7B8 Other secondary neuroendocrine tumors: Secondary | ICD-10-CM

## 2019-10-30 DIAGNOSIS — C7A8 Other malignant neuroendocrine tumors: Secondary | ICD-10-CM | POA: Diagnosis not present

## 2019-10-30 LAB — SURGICAL PATHOLOGY

## 2019-10-31 ENCOUNTER — Telehealth: Payer: Self-pay | Admitting: Hematology

## 2019-10-31 NOTE — Telephone Encounter (Signed)
Scheduled per 9/9 los. Unable to reach pt. Left voicemail with appt times and dates.

## 2019-11-06 ENCOUNTER — Inpatient Hospital Stay: Payer: Medicare HMO

## 2019-11-06 ENCOUNTER — Other Ambulatory Visit: Payer: Self-pay

## 2019-11-06 VITALS — BP 125/70 | HR 62 | Temp 98.3°F | Resp 18

## 2019-11-06 DIAGNOSIS — C7A8 Other malignant neuroendocrine tumors: Secondary | ICD-10-CM | POA: Diagnosis not present

## 2019-11-06 DIAGNOSIS — C7B8 Other secondary neuroendocrine tumors: Secondary | ICD-10-CM | POA: Diagnosis not present

## 2019-11-06 MED ORDER — OCTREOTIDE ACETATE 20 MG IM KIT
PACK | INTRAMUSCULAR | Status: AC
  Filled 2019-11-06: qty 1

## 2019-11-06 MED ORDER — OCTREOTIDE ACETATE 20 MG IM KIT
20.0000 mg | PACK | Freq: Once | INTRAMUSCULAR | Status: AC
  Administered 2019-11-06: 20 mg via INTRAMUSCULAR

## 2019-12-05 ENCOUNTER — Inpatient Hospital Stay: Payer: Medicare HMO | Attending: Hematology

## 2019-12-05 ENCOUNTER — Other Ambulatory Visit: Payer: Self-pay

## 2019-12-05 VITALS — BP 138/77 | HR 60 | Resp 18

## 2019-12-05 DIAGNOSIS — C7A8 Other malignant neuroendocrine tumors: Secondary | ICD-10-CM | POA: Insufficient documentation

## 2019-12-05 DIAGNOSIS — C7B8 Other secondary neuroendocrine tumors: Secondary | ICD-10-CM | POA: Insufficient documentation

## 2019-12-05 MED ORDER — OCTREOTIDE ACETATE 30 MG IM KIT
30.0000 mg | PACK | Freq: Once | INTRAMUSCULAR | Status: AC
  Administered 2019-12-05: 30 mg via INTRAMUSCULAR

## 2019-12-05 MED ORDER — OCTREOTIDE ACETATE 30 MG IM KIT
PACK | INTRAMUSCULAR | Status: AC
  Filled 2019-12-05: qty 1

## 2020-01-05 ENCOUNTER — Other Ambulatory Visit: Payer: Self-pay

## 2020-01-05 ENCOUNTER — Inpatient Hospital Stay: Payer: Medicare HMO | Attending: Hematology

## 2020-01-05 ENCOUNTER — Inpatient Hospital Stay: Payer: Medicare HMO

## 2020-01-05 VITALS — BP 114/67 | HR 62 | Resp 18

## 2020-01-05 DIAGNOSIS — C7B8 Other secondary neuroendocrine tumors: Secondary | ICD-10-CM

## 2020-01-05 DIAGNOSIS — C7A8 Other malignant neuroendocrine tumors: Secondary | ICD-10-CM | POA: Insufficient documentation

## 2020-01-05 LAB — COMPREHENSIVE METABOLIC PANEL
ALT: 40 U/L (ref 0–44)
AST: 54 U/L — ABNORMAL HIGH (ref 15–41)
Albumin: 3.3 g/dL — ABNORMAL LOW (ref 3.5–5.0)
Alkaline Phosphatase: 139 U/L — ABNORMAL HIGH (ref 38–126)
Anion gap: 10 (ref 5–15)
BUN: 17 mg/dL (ref 8–23)
CO2: 26 mmol/L (ref 22–32)
Calcium: 8.6 mg/dL — ABNORMAL LOW (ref 8.9–10.3)
Chloride: 101 mmol/L (ref 98–111)
Creatinine, Ser: 1.06 mg/dL — ABNORMAL HIGH (ref 0.44–1.00)
GFR, Estimated: 55 mL/min — ABNORMAL LOW (ref >60)
Glucose, Bld: 285 mg/dL — ABNORMAL HIGH (ref 70–99)
Potassium: 4.4 mmol/L (ref 3.5–5.1)
Sodium: 137 mmol/L (ref 135–145)
Total Bilirubin: 0.5 mg/dL (ref 0.3–1.2)
Total Protein: 6.9 g/dL (ref 6.5–8.1)

## 2020-01-05 LAB — CBC WITH DIFFERENTIAL/PLATELET
Abs Immature Granulocytes: 0.02 10*3/uL (ref 0.00–0.07)
Basophils Absolute: 0.1 10*3/uL (ref 0.0–0.1)
Basophils Relative: 1 %
Eosinophils Absolute: 0.3 10*3/uL (ref 0.0–0.5)
Eosinophils Relative: 5 %
HCT: 36.2 % (ref 36.0–46.0)
Hemoglobin: 12 g/dL (ref 12.0–15.0)
Immature Granulocytes: 0 %
Lymphocytes Relative: 33 %
Lymphs Abs: 2.2 10*3/uL (ref 0.7–4.0)
MCH: 30.5 pg (ref 26.0–34.0)
MCHC: 33.1 g/dL (ref 30.0–36.0)
MCV: 92.1 fL (ref 80.0–100.0)
Monocytes Absolute: 0.4 10*3/uL (ref 0.1–1.0)
Monocytes Relative: 6 %
Neutro Abs: 3.7 10*3/uL (ref 1.7–7.7)
Neutrophils Relative %: 55 %
Platelets: 184 10*3/uL (ref 150–400)
RBC: 3.93 MIL/uL (ref 3.87–5.11)
RDW: 13.1 % (ref 11.5–15.5)
WBC: 6.7 10*3/uL (ref 4.0–10.5)
nRBC: 0 % (ref 0.0–0.2)

## 2020-01-05 MED ORDER — OCTREOTIDE ACETATE 30 MG IM KIT
PACK | INTRAMUSCULAR | Status: AC
  Filled 2020-01-05: qty 1

## 2020-01-05 MED ORDER — OCTREOTIDE ACETATE 30 MG IM KIT
30.0000 mg | PACK | Freq: Once | INTRAMUSCULAR | Status: AC
  Administered 2020-01-05: 30 mg via INTRAMUSCULAR

## 2020-01-05 NOTE — Progress Notes (Signed)
Injection malfunctioned so I made pharmacy aware and sent the injection to them and retrieved another injection.Marland Kitchen

## 2020-01-05 NOTE — Patient Instructions (Signed)
Octreotide injection solution What is this medicine? OCTREOTIDE (ok TREE oh tide) is used to reduce blood levels of growth hormone in patients with a condition called acromegaly. This medicine also reduces flushing and watery diarrhea caused by certain types of cancer. This medicine may be used for other purposes; ask your health care provider or pharmacist if you have questions. COMMON BRAND NAME(S): Bynfezia, Sandostatin What should I tell my health care provider before I take this medicine? They need to know if you have any of these conditions:  diabetes  gallbladder disease  kidney disease  liver disease  thyroid disease  an unusual or allergic reaction to octreotide, other medicines, foods, dyes, or preservatives  pregnant or trying to get pregnant  breast-feeding How should I use this medicine? This medicine is for injection under the skin or into a vein (only in emergency situations). It is usually given by a health care professional in a hospital or clinic setting. If you get this medicine at home, you will be taught how to prepare and give this medicine. Allow the injection solution to come to room temperature before use. Do not warm it artificially. Use exactly as directed. Take your medicine at regular intervals. Do not take your medicine more often than directed. It is important that you put your used needles and syringes in a special sharps container. Do not put them in a trash can. If you do not have a sharps container, call your pharmacist or healthcare provider to get one. Talk to your pediatrician regarding the use of this medicine in children. Special care may be needed. Overdosage: If you think you have taken too much of this medicine contact a poison control center or emergency room at once. NOTE: This medicine is only for you. Do not share this medicine with others. What if I miss a dose? If you miss a dose, take it as soon as you can. If it is almost time for your  next dose, take only that dose. Do not take double or extra doses. What may interact with this medicine?  bromocriptine  certain medicines for blood pressure, heart disease, irregular heartbeat  cyclosporine  diuretics  medicines for diabetes, including insulin  quinidine This list may not describe all possible interactions. Give your health care provider a list of all the medicines, herbs, non-prescription drugs, or dietary supplements you use. Also tell them if you smoke, drink alcohol, or use illegal drugs. Some items may interact with your medicine. What should I watch for while using this medicine? Visit your doctor or health care professional for regular checks on your progress. To help reduce irritation at the injection site, use a different site for each injection and make sure the solution is at room temperature before use. This medicine may cause decreases in blood sugar. Signs of low blood sugar include chills, cool, pale skin or cold sweats, drowsiness, extreme hunger, fast heartbeat, headache, nausea, nervousness or anxiety, shakiness, trembling, unsteadiness, tiredness, or weakness. Contact your doctor or health care professional right away if you experience any of these symptoms. This medicine may increase blood sugar. Ask your healthcare provider if changes in diet or medicines are needed if you have diabetes. This medicine may cause a decrease in vitamin B12. You should make sure that you get enough vitamin B12 while you are taking this medicine. Discuss the foods you eat and the vitamins you take with your health care professional. What side effects may I notice from receiving this medicine? Side   effects that you should report to your doctor or health care professional as soon as possible:  allergic reactions like skin rash, itching or hives, swelling of the face, lips, or tongue  fast, slow, or irregular heartbeat  right upper belly pain  severe stomach pain  signs  and symptoms of high blood sugar such as being more thirsty or hungry or having to urinate more than normal. You may also feel very tired or have blurry vision.  signs and symptoms of low blood sugar such as feeling anxious; confusion; dizziness; increased hunger; unusually weak or tired; increased sweating; shakiness; cold, clammy skin; irritable; headache; blurred vision; fast heartbeat; loss of consciousness  unusually weak or tired Side effects that usually do not require medical attention (report to your doctor or health care professional if they continue or are bothersome):  diarrhea  dizziness  gas  headache  nausea, vomiting  pain, redness, or irritation at site where injected  upset stomach This list may not describe all possible side effects. Call your doctor for medical advice about side effects. You may report side effects to FDA at 1-800-FDA-1088. Where should I keep my medicine? Keep out of the reach of children. Store in a refrigerator between 2 and 8 degrees C (36 and 46 degrees F). Protect from light. Allow to come to room temperature naturally. Do not use artificial heat. If protected from light, the injection may be stored at room temperature between 20 and 30 degrees C (70 and 86 degrees F) for 14 days. After the initial use, throw away any unused portion of a multiple dose vial after 14 days. Throw away unused portions of the ampules after use. NOTE: This sheet is a summary. It may not cover all possible information. If you have questions about this medicine, talk to your doctor, pharmacist, or health care provider.  2020 Elsevier/Gold Standard (2018-09-05 13:33:09)  

## 2020-01-06 ENCOUNTER — Telehealth: Payer: Self-pay | Admitting: *Deleted

## 2020-01-06 NOTE — Telephone Encounter (Signed)
-----   Message from Truitt Merle, MD sent at 01/06/2020 10:54 AM EST ----- Please let pt know her lab results, BG high, could be related to her sandostatin injection, please encourage her to monitor at home, watch her diet, and discuss with her PCP if her BG persistently high and she may need to increase her metformin dose. Thanks   Truitt Merle  01/06/2020

## 2020-01-06 NOTE — Telephone Encounter (Signed)
Pt returned call & information given per Dr Burr Medico.  She doesn't usually check BS at home very often but states that she can.  She expressed understanding to watch diet & call PCP if glucose consistently elevated.

## 2020-01-07 LAB — CHROMOGRANIN A: Chromogranin A (ng/mL): 45.2 ng/mL (ref 0.0–101.8)

## 2020-01-08 ENCOUNTER — Other Ambulatory Visit: Payer: Self-pay

## 2020-01-08 ENCOUNTER — Emergency Department (HOSPITAL_BASED_OUTPATIENT_CLINIC_OR_DEPARTMENT_OTHER): Payer: Medicare HMO

## 2020-01-08 ENCOUNTER — Emergency Department (HOSPITAL_BASED_OUTPATIENT_CLINIC_OR_DEPARTMENT_OTHER)
Admission: EM | Admit: 2020-01-08 | Discharge: 2020-01-08 | Disposition: A | Discharge status: Home / Self Care | Payer: Medicare HMO | Attending: Emergency Medicine | Admitting: Emergency Medicine

## 2020-01-08 ENCOUNTER — Encounter (HOSPITAL_BASED_OUTPATIENT_CLINIC_OR_DEPARTMENT_OTHER): Payer: Self-pay

## 2020-01-08 ENCOUNTER — Telehealth: Payer: Self-pay | Admitting: Internal Medicine

## 2020-01-08 DIAGNOSIS — Z7984 Long term (current) use of oral hypoglycemic drugs: Secondary | ICD-10-CM | POA: Diagnosis not present

## 2020-01-08 DIAGNOSIS — S00531A Contusion of lip, initial encounter: Secondary | ICD-10-CM | POA: Diagnosis not present

## 2020-01-08 DIAGNOSIS — Y92481 Parking lot as the place of occurrence of the external cause: Secondary | ICD-10-CM | POA: Diagnosis not present

## 2020-01-08 DIAGNOSIS — E119 Type 2 diabetes mellitus without complications: Secondary | ICD-10-CM | POA: Diagnosis not present

## 2020-01-08 DIAGNOSIS — Z85038 Personal history of other malignant neoplasm of large intestine: Secondary | ICD-10-CM | POA: Diagnosis not present

## 2020-01-08 DIAGNOSIS — J45909 Unspecified asthma, uncomplicated: Secondary | ICD-10-CM | POA: Insufficient documentation

## 2020-01-08 DIAGNOSIS — S0512XA Contusion of eyeball and orbital tissues, left eye, initial encounter: Secondary | ICD-10-CM | POA: Diagnosis not present

## 2020-01-08 DIAGNOSIS — Z7951 Long term (current) use of inhaled steroids: Secondary | ICD-10-CM | POA: Insufficient documentation

## 2020-01-08 DIAGNOSIS — W228XXA Striking against or struck by other objects, initial encounter: Secondary | ICD-10-CM | POA: Insufficient documentation

## 2020-01-08 DIAGNOSIS — E039 Hypothyroidism, unspecified: Secondary | ICD-10-CM | POA: Diagnosis not present

## 2020-01-08 DIAGNOSIS — Y9301 Activity, walking, marching and hiking: Secondary | ICD-10-CM | POA: Insufficient documentation

## 2020-01-08 DIAGNOSIS — Z79899 Other long term (current) drug therapy: Secondary | ICD-10-CM | POA: Diagnosis not present

## 2020-01-08 DIAGNOSIS — W19XXXA Unspecified fall, initial encounter: Secondary | ICD-10-CM

## 2020-01-08 DIAGNOSIS — S0083XA Contusion of other part of head, initial encounter: Secondary | ICD-10-CM | POA: Diagnosis not present

## 2020-01-08 DIAGNOSIS — S0990XA Unspecified injury of head, initial encounter: Secondary | ICD-10-CM | POA: Diagnosis not present

## 2020-01-08 DIAGNOSIS — R9431 Abnormal electrocardiogram [ECG] [EKG]: Secondary | ICD-10-CM | POA: Diagnosis not present

## 2020-01-08 LAB — CBC WITH DIFFERENTIAL/PLATELET
Abs Immature Granulocytes: 0.02 10*3/uL (ref 0.00–0.07)
Basophils Absolute: 0.1 10*3/uL (ref 0.0–0.1)
Basophils Relative: 1 %
Eosinophils Absolute: 0.3 10*3/uL (ref 0.0–0.5)
Eosinophils Relative: 4 %
HCT: 37.9 % (ref 36.0–46.0)
Hemoglobin: 12.4 g/dL (ref 12.0–15.0)
Immature Granulocytes: 0 %
Lymphocytes Relative: 26 %
Lymphs Abs: 1.7 10*3/uL (ref 0.7–4.0)
MCH: 30.5 pg (ref 26.0–34.0)
MCHC: 32.7 g/dL (ref 30.0–36.0)
MCV: 93.1 fL (ref 80.0–100.0)
Monocytes Absolute: 0.4 10*3/uL (ref 0.1–1.0)
Monocytes Relative: 6 %
Neutro Abs: 4.2 10*3/uL (ref 1.7–7.7)
Neutrophils Relative %: 63 %
Platelets: 200 10*3/uL (ref 150–400)
RBC: 4.07 MIL/uL (ref 3.87–5.11)
RDW: 13.2 % (ref 11.5–15.5)
WBC: 6.6 10*3/uL (ref 4.0–10.5)
nRBC: 0 % (ref 0.0–0.2)

## 2020-01-08 LAB — BASIC METABOLIC PANEL
Anion gap: 7 (ref 5–15)
BUN: 15 mg/dL (ref 8–23)
CO2: 30 mmol/L (ref 22–32)
Calcium: 9.3 mg/dL (ref 8.9–10.3)
Chloride: 99 mmol/L (ref 98–111)
Creatinine, Ser: 1.01 mg/dL — ABNORMAL HIGH (ref 0.44–1.00)
GFR, Estimated: 58 mL/min — ABNORMAL LOW (ref >60)
Glucose, Bld: 282 mg/dL — ABNORMAL HIGH (ref 70–99)
Potassium: 4.3 mmol/L (ref 3.5–5.1)
Sodium: 136 mmol/L (ref 135–145)

## 2020-01-08 IMAGING — CT CT HEAD W/O CM
4 series · 16 of 47 positions shown, 18 images · non-contrast
Comparison: None.

CLINICAL DATA: Facial and head trauma.

EXAM:
CT HEAD WITHOUT CONTRAST
CT MAXILLOFACIAL WITHOUT CONTRAST
TECHNIQUE: Multidetector CT imaging of the head and maxillofacial structures
were performed using the standard protocol without intravenous
contrast. Multiplanar CT image reconstructions of the maxillofacial
structures were also generated.

[Series 2: head wo · axial · 0.40mm/px · z∈[-150,-35]mm · 7 of 31 slices shown, 9 images]
[im 4/31  brain]
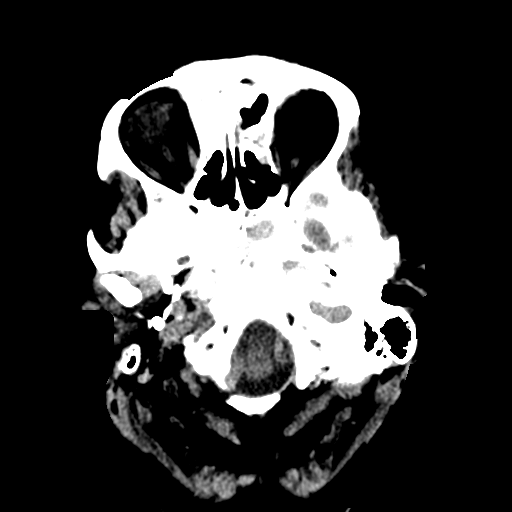
[im 4/31  bone]
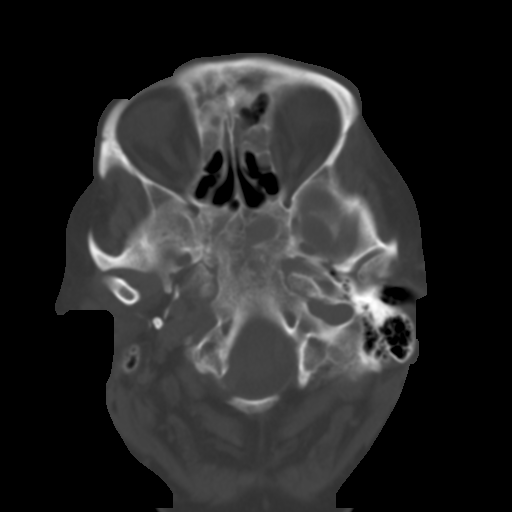
[im 8/31  brain]
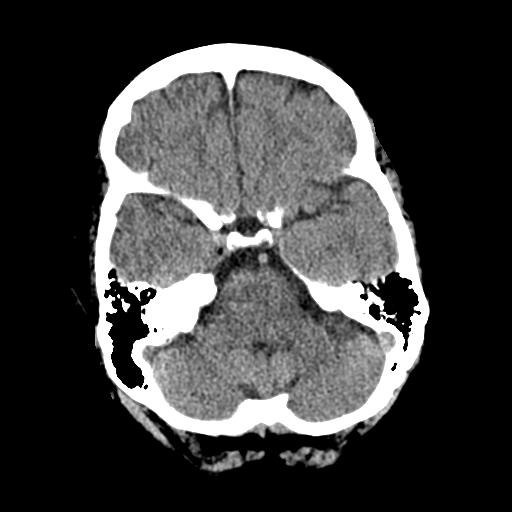
[im 12/31  brain]
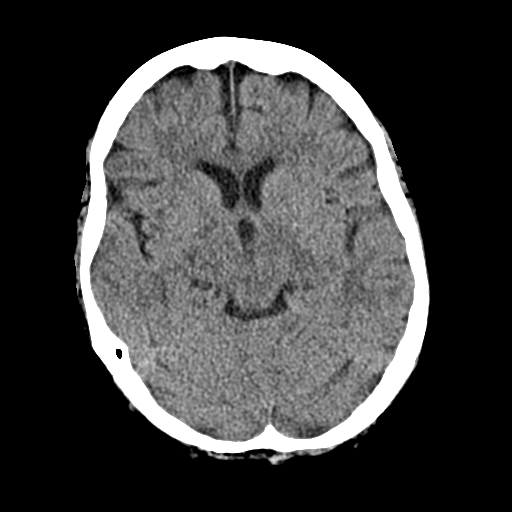
[im 16/31  brain]
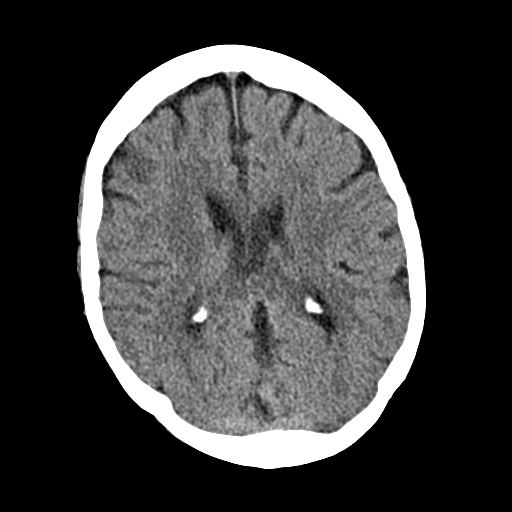
[im 19/31  brain]
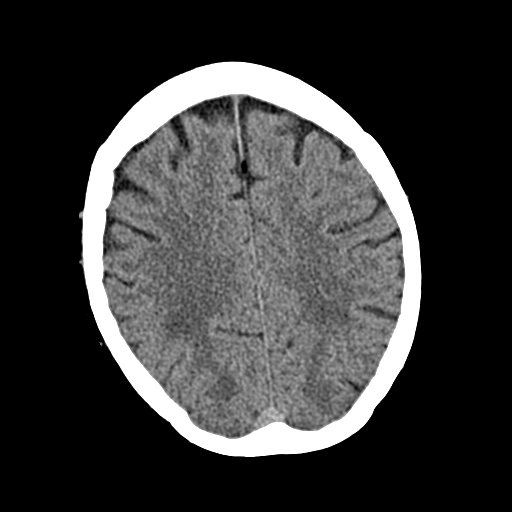
[im 19/31  bone]
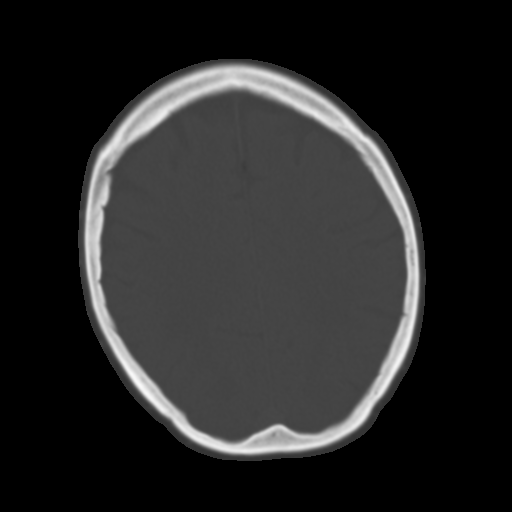
[im 23/31  brain]
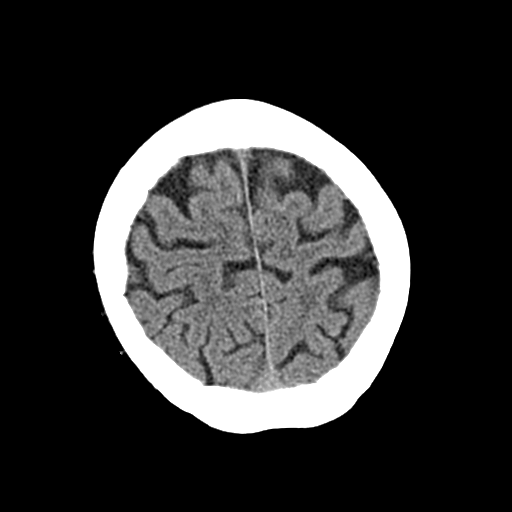
[im 27/31  brain]
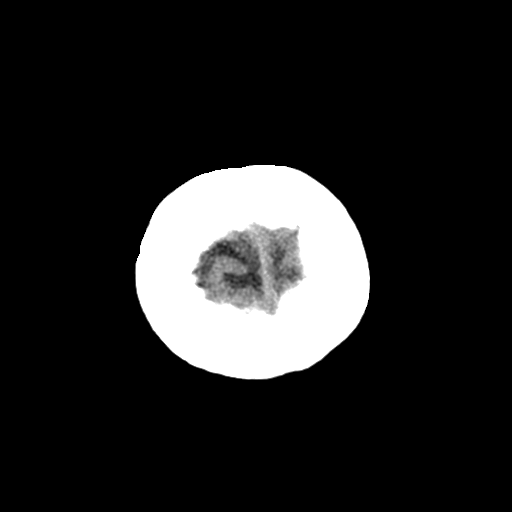

[Series 3: head bone · axial · 0.40mm/px · z∈[-151,-121]mm · 3 of 77 slices shown]
[im 8/77  bone]
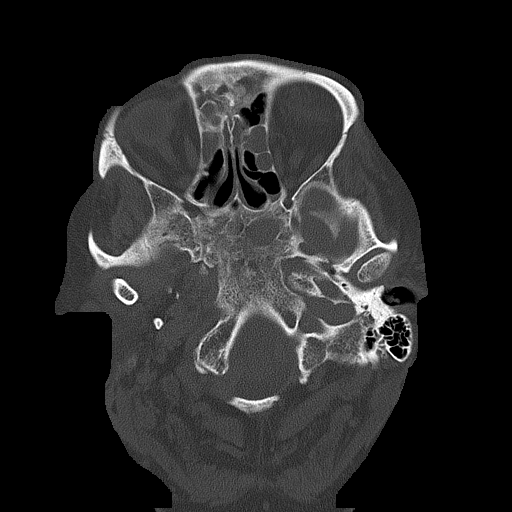
[im 16/77  bone]
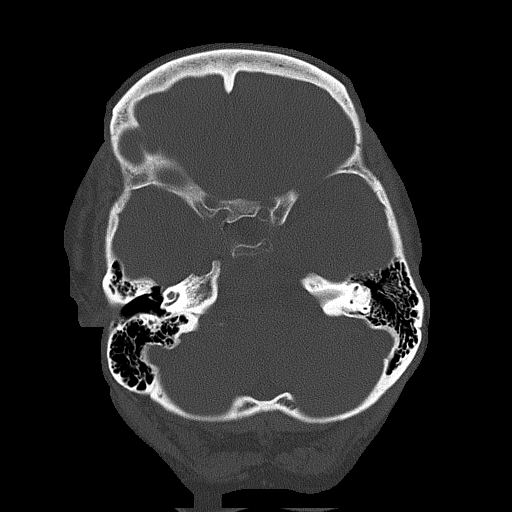
[im 23/77  bone]
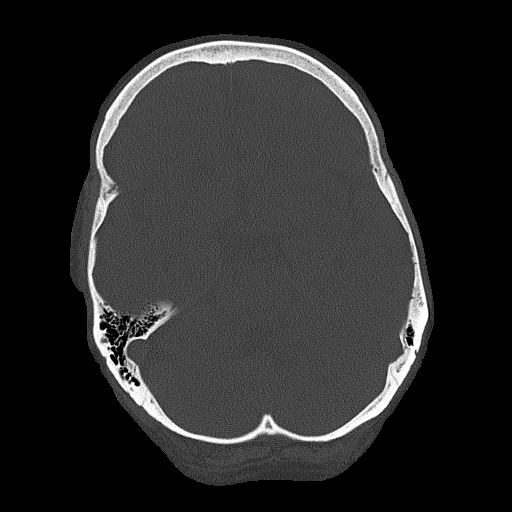

[Series 4: cor head wo · coronal · 0.32mm/px · 3 of 66 slices shown]
[im 22/66  brain]
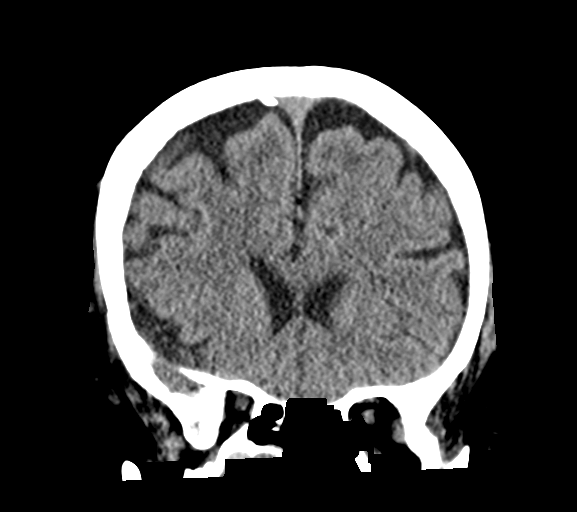
[im 29/66  brain]
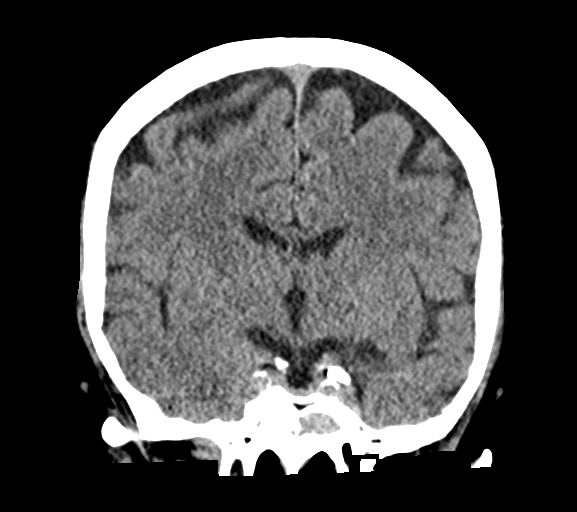
[im 37/66  brain]
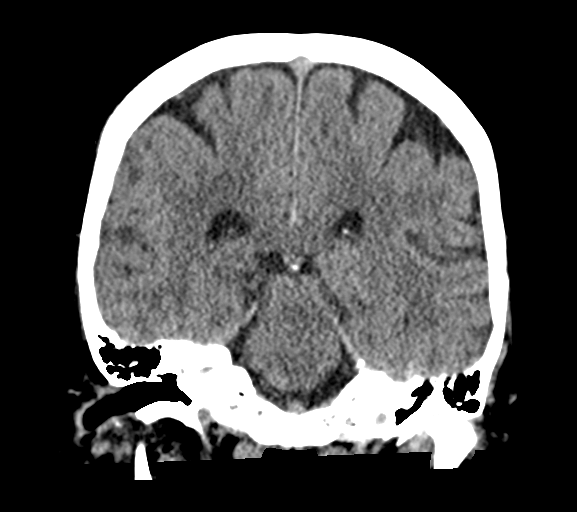

[Series 5: sag head wo · sagittal · 0.32mm/px · 3 of 55 slices shown]
[im 19/55  brain]
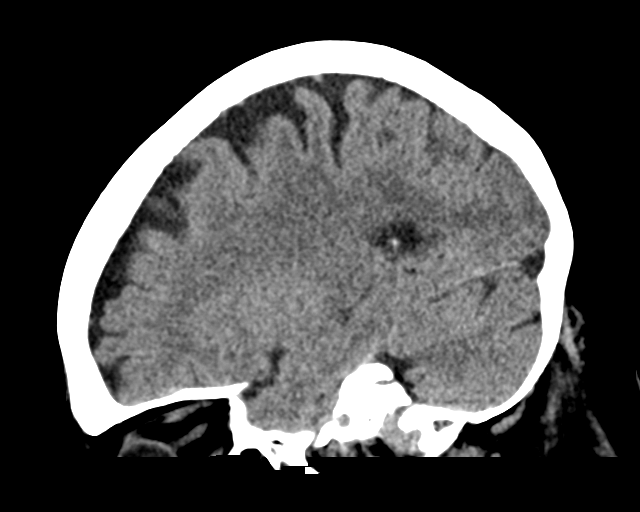
[im 28/55  brain]
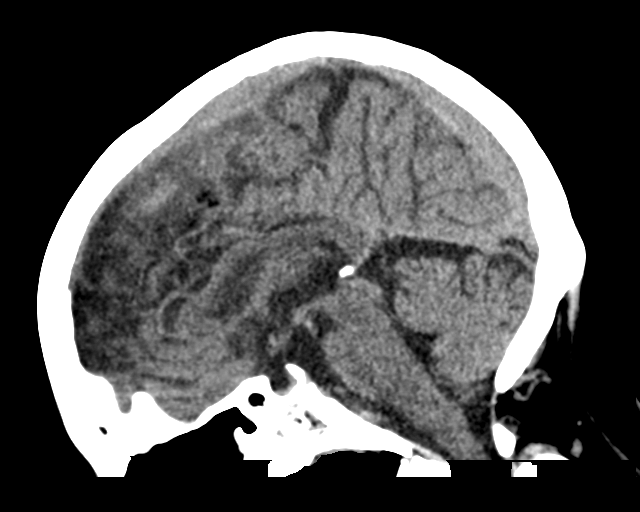
[im 37/55  brain]
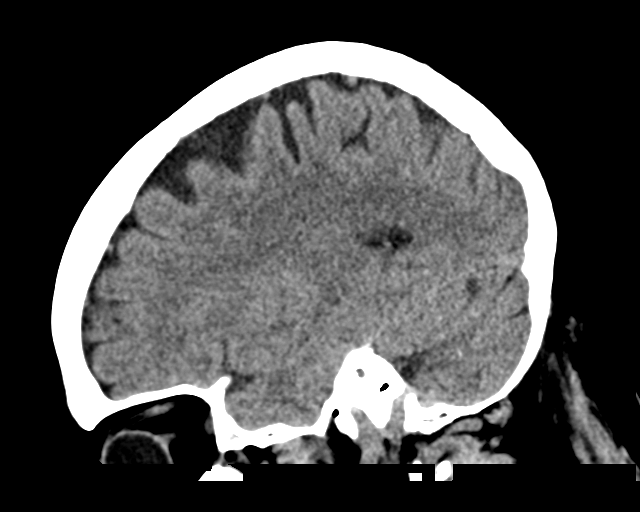

[16 of 47 positions shown; findings below may reference images not displayed]

FINDINGS: CT HEAD FINDINGS

Brain: No evidence of acute infarction, hemorrhage, hydrocephalus,
extra-axial collection or mass lesion/mass effect. Patchy white
matter hypoattenuation, most likely the sequela of chronic
microvascular ischemic disease. Mild generalized cerebral volume
loss.

Vascular: Calcific atherosclerosis.

Skull: No acute fracture.

Other: No mastoid effusions.

CT MAXILLOFACIAL FINDINGS

Osseous: No fracture or mandibular dislocation. No destructive
process.

Orbits: Negative. No traumatic or inflammatory finding.

Sinuses: Complete opacification of the right maxillary sinus. Left
maxillary mucosal thickening with air-fluid level. Opacification of
scattered ethmoid air cells. Underpneumatized frontal sinuses.

Soft tissues: Left periorbital soft tissue contusion.

Other: Dental caries.
IMPRESSION: CT head:

1. No evidence of acute intracranial abnormality.
2. Chronic microvascular ischemic disease.

CT maxillofacial:

1. Left periorbital contusion without evidence of acute fracture.
2. Paranasal sinus disease. Correlate with signs/symptoms of
sinusitis.

## 2020-01-08 IMAGING — CT CT MAXILLOFACIAL W/O CM
3 series · 16 of 47 positions shown, 19 images · non-contrast
Comparison: None.

CLINICAL DATA: Facial and head trauma.

EXAM:
CT HEAD WITHOUT CONTRAST
CT MAXILLOFACIAL WITHOUT CONTRAST
TECHNIQUE: Multidetector CT imaging of the head and maxillofacial structures
were performed using the standard protocol without intravenous
contrast. Multiplanar CT image reconstructions of the maxillofacial
structures were also generated.

[Series 2: max soft · axial · 0.33mm/px · z∈[-256,-132]mm · 10 of 74 slices shown, 13 images]
[im 6/74  brain]
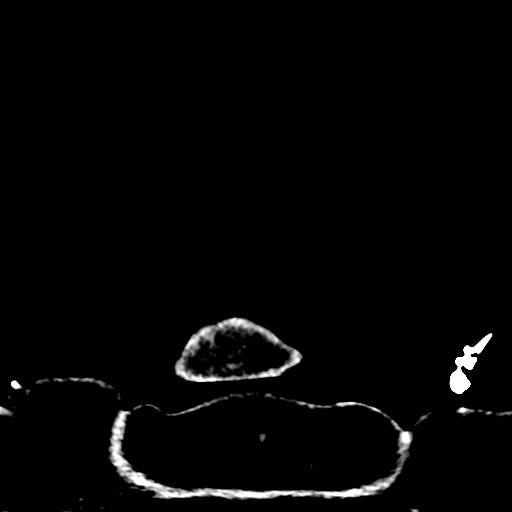
[im 6/74  bone]
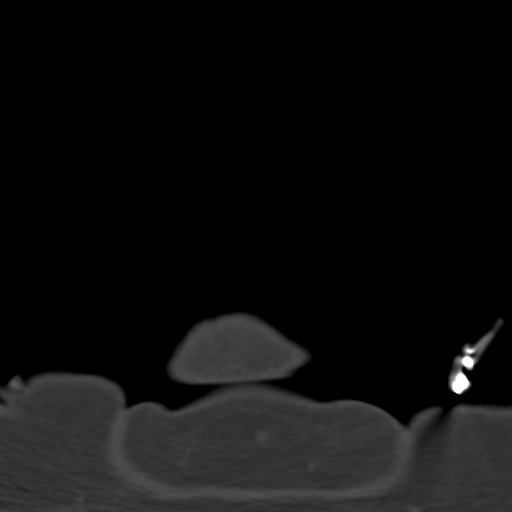
[im 13/74  bone]
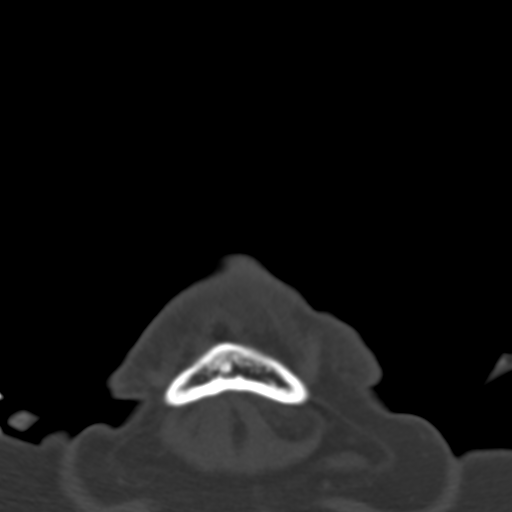
[im 21/74  bone]
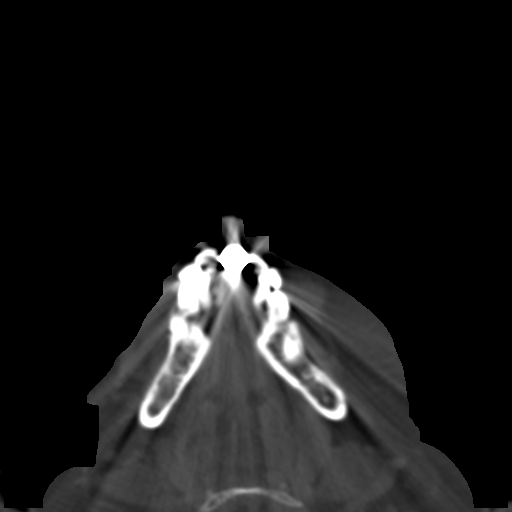
[im 26/74  bone]
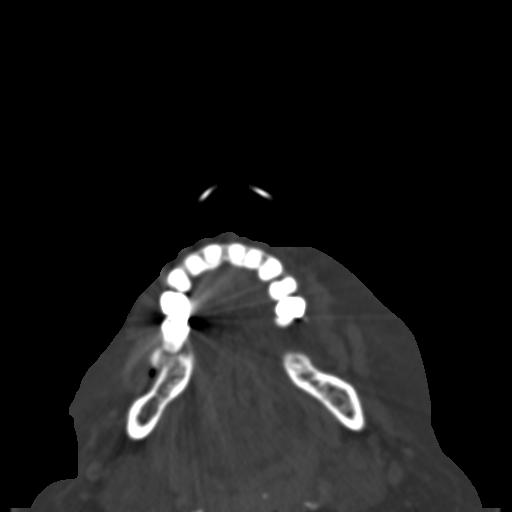
[im 33/74  brain]
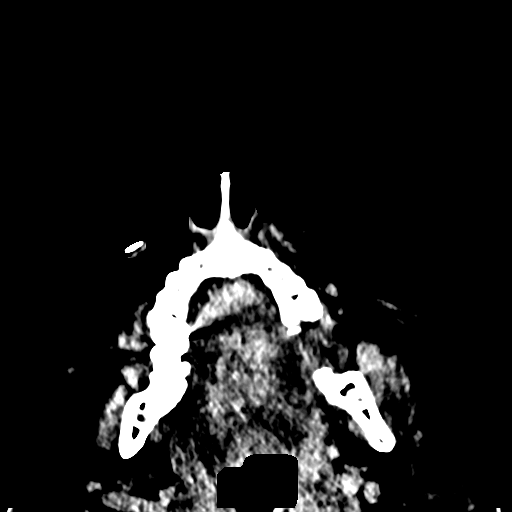
[im 33/74  bone]
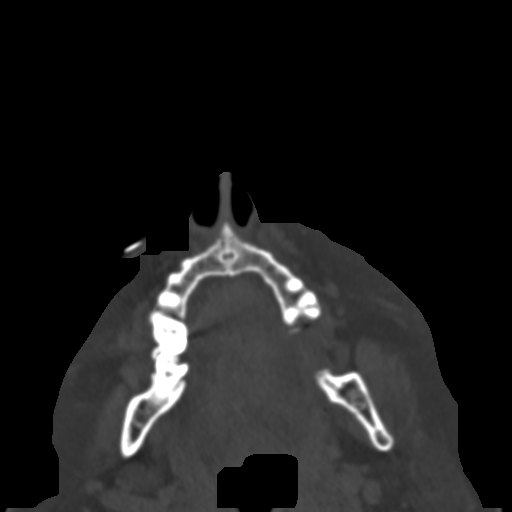
[im 41/74  bone]
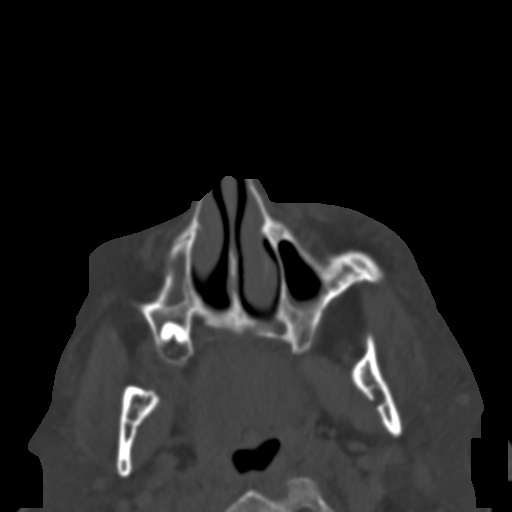
[im 48/74  bone]
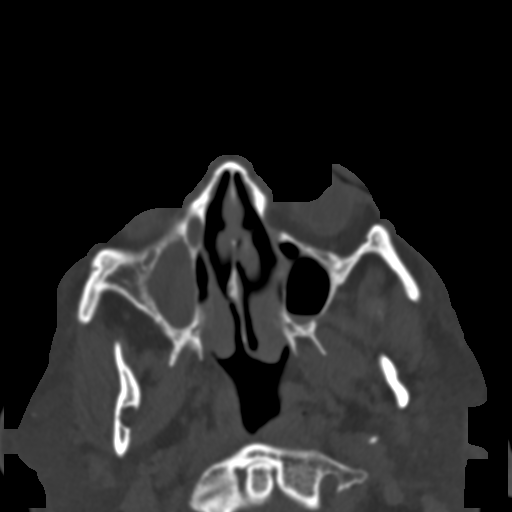
[im 56/74  bone]
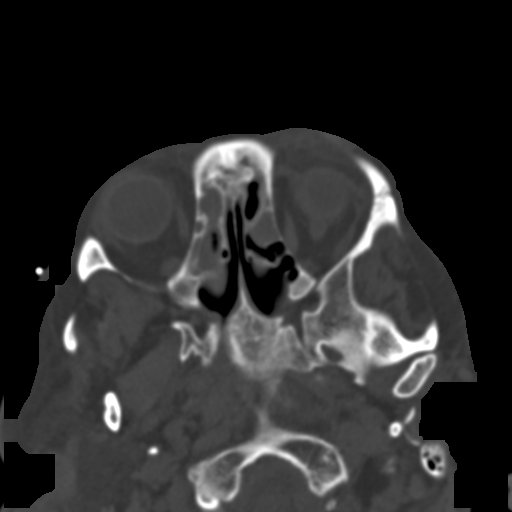
[im 61/74  brain]
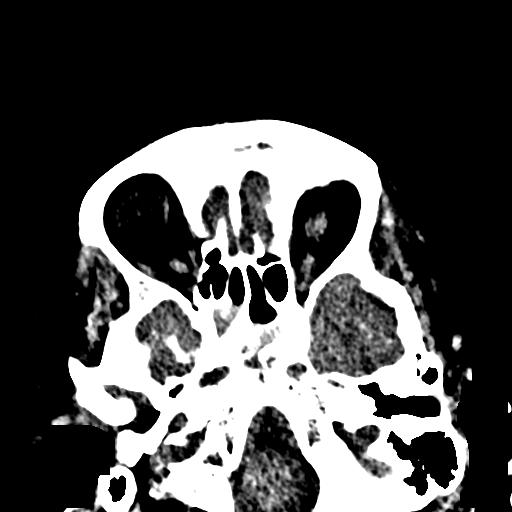
[im 61/74  bone]
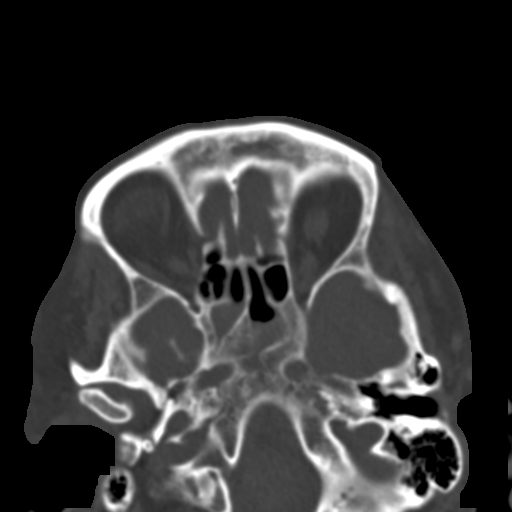
[im 68/74  bone]
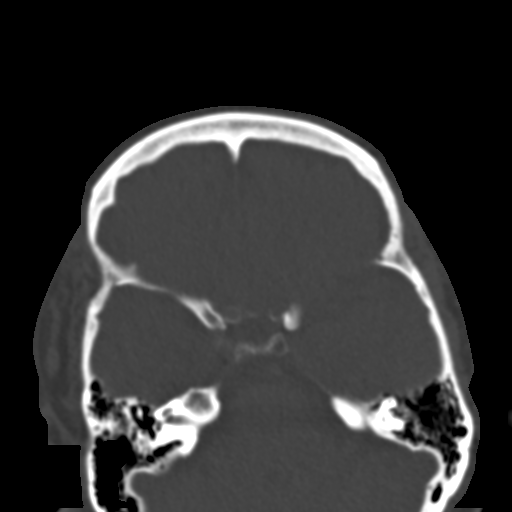

[Series 6: coronal soft · coronal · 0.32mm/px · 3 of 77 slices shown]
[im 26/77  bone]
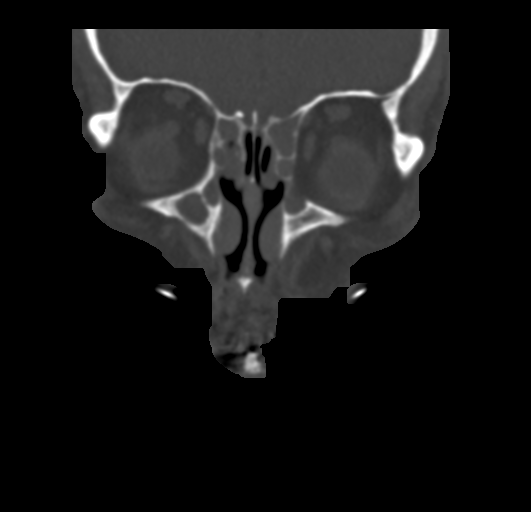
[im 34/77  bone]
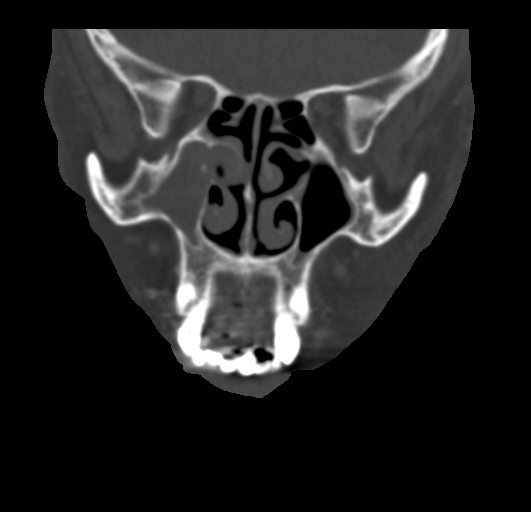
[im 43/77  bone]
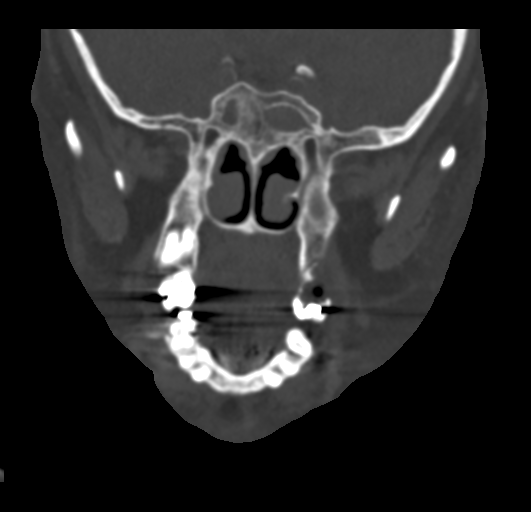

[Series 7: sagittal soft · sagittal · 0.29mm/px · 3 of 83 slices shown]
[im 28/83  bone]
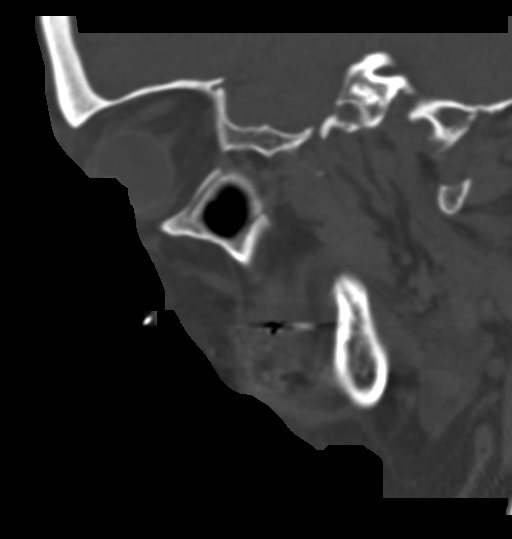
[im 42/83  bone]
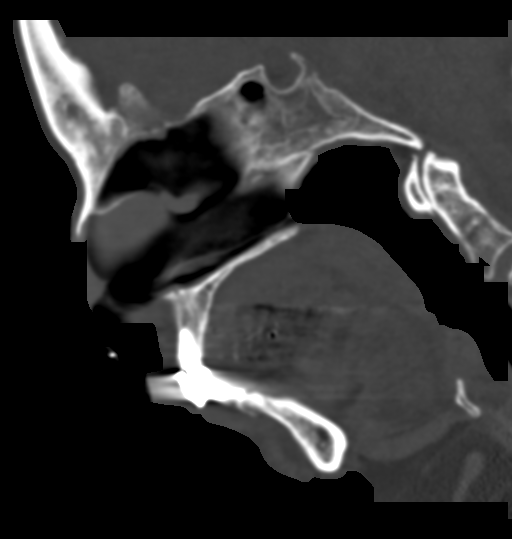
[im 55/83  bone]
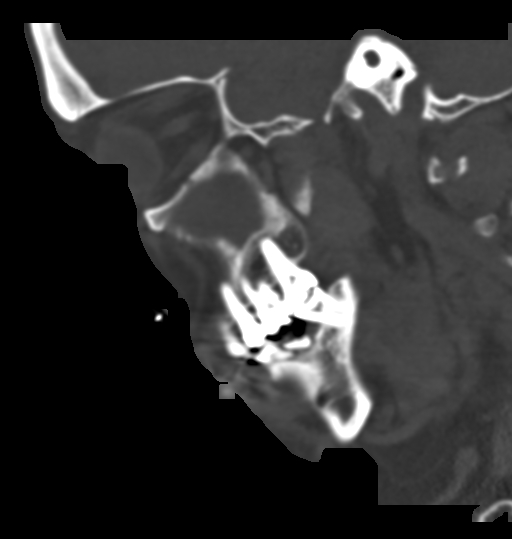

[16 of 47 positions shown; findings below may reference images not displayed]

FINDINGS: CT HEAD FINDINGS

Brain: No evidence of acute infarction, hemorrhage, hydrocephalus,
extra-axial collection or mass lesion/mass effect. Patchy white
matter hypoattenuation, most likely the sequela of chronic
microvascular ischemic disease. Mild generalized cerebral volume
loss.

Vascular: Calcific atherosclerosis.

Skull: No acute fracture.

Other: No mastoid effusions.

CT MAXILLOFACIAL FINDINGS

Osseous: No fracture or mandibular dislocation. No destructive
process.

Orbits: Negative. No traumatic or inflammatory finding.

Sinuses: Complete opacification of the right maxillary sinus. Left
maxillary mucosal thickening with air-fluid level. Opacification of
scattered ethmoid air cells. Underpneumatized frontal sinuses.

Soft tissues: Left periorbital soft tissue contusion.

Other: Dental caries.
IMPRESSION: CT head:

1. No evidence of acute intracranial abnormality.
2. Chronic microvascular ischemic disease.

CT maxillofacial:

1. Left periorbital contusion without evidence of acute fracture.
2. Paranasal sinus disease. Correlate with signs/symptoms of
sinusitis.

## 2020-01-08 NOTE — Telephone Encounter (Signed)
Patient had a fall on Monday and has some very bad bruising to her face and today she went to [employer] to be evaluated to make sure nothing was broken and she states there wasn't, patient wondering if Dr. Quay Burow would be willing to write her a note to put her on a Leave of absence at work. Patient # [phone]

## 2020-01-08 NOTE — Telephone Encounter (Signed)
[place] for letter - what days does she want off?

## 2020-01-08 NOTE — ED Provider Notes (Signed)
[facility] DEPARTMENT Provider Note   CSN: [id number] Arrival date & time: 01/08/20  9485     History Chief Complaint  Patient presents with  . Fall    Jessica Farrell is a 74 y.o. female.  Patient is a 74 year old female with past medical history of colon cancer with surgical resection, diabetes, hyperlipidemia.  She presents today for evaluation of fall.  Patient was walking through a restaurant parking lot 3 days ago when for unknown reasons ended up striking her head on the concrete.  She had swelling to her forehead immediately after and is now having bruising extending into the periorbital soft tissues and cheeks.  She denies any neck pain.  She does report some headache, however this is improving.  She denies any visual disturbances.  She is not sure how she ended up falling, whether she fainted or tripped and fell.  She was able to get up off the ground on her own and drove herself home.  The history is provided by the patient.  Fall This is a new problem. Episode onset: 3 days ago. The problem has not changed since onset.Associated symptoms include headaches. Nothing aggravates the symptoms. Nothing relieves the symptoms. She has tried nothing for the symptoms.       Past Medical History:  Diagnosis Date  . Asthma   . Cellulitis March  2014   Left foot and ankle  . colon ca dx'd 01/2014  . Colon polyps    2015   . Diabetes mellitus ([place]) 05/05/2012   Type II  . Hyperlipidemia   . Morbid obesity ([place])   . Thyroid disease 1990's   HypoThyroidism    Patient Active Problem List   Diagnosis Date Noted  . Mouth abscess 02/25/2019  . Cellulitis, face 02/25/2019  . Tingling in extremities 05/23/2018  . Osteopenia 03/27/2018  . Umbilical hernia without obstruction or gangrene 11/21/2017  . Onychomycosis 05/22/2017  . Athlete's foot 05/22/2017  . Carcinoid tumor of cecum 01/26/2017  . 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  ([place]) 02/18/2014  . Chronic venous insufficiency 08/02/2012  . HLD (hyperlipidemia) 05/15/2012  . Cellulitis of left anterior lower leg 05/05/2012  . Diabetes mellitus ([place]) 05/05/2012  . Hypothyroidism 05/05/2012  . Asthma 05/05/2012    Past Surgical History:  Procedure Laterality Date  . CHOLECYSTECTOMY N/A 05/02/2017   Procedure: LAPAROSCOPIC CHOLECYSTECTOMY WITH INTRAOPERATIVE CHOLANGIOGRAM, VENTRAL HERNIA REPAIR;  Surgeon: Jovita Kussmaul, MD;  Location: [facility];  Service: General;  Laterality: N/A;  . COLON RESECTION N/A 02/03/2014   Procedure: LAPAROSCOPIC ASSISTED BOWEL RESECTION;  Surgeon: Jackolyn Confer, MD;  Location: [facility];  Service: General;  Laterality: N/A;  . FRACTURE SURGERY  2000   Ankle  . TOOTH EXTRACTION     wisdom Teeth     OB History   No obstetric history on file.     Family History  Problem Relation Age of Onset  . Alzheimer's disease Father   . Heart disease Mother   . Arthritis Mother   . Hypertension Mother   . Alzheimer's disease Sister   . Cancer Sister 9       breast cancer   . Heart disease Brother        Heart Disease before age 57  . Breast cancer Sister   . Colon cancer Neg Hx   . Esophageal cancer Neg Hx   . Rectal cancer Neg Hx   . Stomach cancer Neg Hx     Social  History   Tobacco Use  . Smoking status: Never Smoker  . Smokeless tobacco: Never Used  Vaping Use  . Vaping Use: Never used  Substance Use Topics  . Alcohol use: No  . Drug use: No    Home Medications Prior to Admission medications   Medication Sig Start Date End Date Taking? Authorizing Provider  albuterol (PROVENTIL HFA;VENTOLIN HFA) 108 (90 Base) MCG/ACT inhaler Inhale 2 puffs into the lungs every 6 (six) hours as needed for wheezing or shortness of breath. 05/23/18   Binnie Rail, MD  atorvastatin (LIPITOR) 10 MG tablet Take 1 tablet (10 mg total) by mouth daily. Need office visit for more refills. 05/16/19   Binnie Rail, MD  blood glucose meter kit and  supplies KIT Dispense based on patient and insurance preference. Use up to 2 times daily as directed. DX code: E11.9 06/10/18   Binnie Rail, MD  ciclopirox (PENLAC) 8 % solution Apply topically at bedtime. Apply over nail & surrounding skin. Apply daily over previous coat. After 7 days, may remove w alcohol &continue 05/22/17   Binnie Rail, MD  gabapentin (NEURONTIN) 100 MG capsule Take 1 capsule (100 mg total) by mouth at bedtime. 11/21/17   Binnie Rail, MD  glucose blood test strip Based on insurance preference. Use as instructed once daily to check sugars. Dx E11.9 06/04/18   Binnie Rail, MD  Lancets [facility] ULTRASOFT) lancets Use as instructed once daily to check sugars. Dx E11.9 05/23/18   Binnie Rail, MD  levothyroxine (SYNTHROID) 137 MCG tablet TAKE 1 TABLET DAILY BEFORE BREAKFAST. NEED OFFICE VISIT WITH LABS  03/28/19   Binnie Rail, MD  loperamide (IMODIUM) 1 MG/5ML solution Take by mouth as needed for diarrhea or loose stools.    [provider]  metFORMIN (GLUCOPHAGE) 500 MG tablet TAKE 1 TABLET TWICE DAILY WITH MEALS 08/28/19   Burns, Claudina Lick, MD  montelukast (SINGULAIR) 10 MG tablet Take 1 tablet (10 mg total) by mouth at bedtime. 04/12/17   Binnie Rail, MD  Multiple Vitamins-Minerals (CENTRUM SILVER PO) Take 1 tablet by mouth daily.    [provider]  Phenylephrine HCl (SINEX REGULAR NA) Place 1 spray into the nose daily as needed (ALLERGIES).    [provider]  promethazine (PHENERGAN) 12.5 MG tablet Take 1 tablet (12.5 mg total) by mouth every 6 (six) hours as needed for nausea or vomiting. 02/25/19   Janith Lima, MD  terbinafine (LAMISIL AT) 1 % cream Apply 1 application topically 2 (two) times daily. 05/22/17   Burns, Claudina Lick, MD  umeclidinium-vilanterol (ANORO ELLIPTA) 62.5-25 MCG/INH AEPB Inhale 1 puff daily into the lungs. 01/05/17   Binnie Rail, MD    Allergies    Penicillins  Review of Systems   Review of Systems  Neurological:  Positive for headaches.  All other systems reviewed and are negative.   Physical Exam Updated Vital Signs BP (!) 144/89 (BP Location: Right Arm)   Pulse 82   Temp 98.2 F (36.8 C) (Oral)   Resp 16   Ht $R'5\' 5"'Nt$  (1.651 m)   Wt 79.4 kg   SpO2 96%   BMI 29.12 kg/m   Physical Exam Vitals and nursing note reviewed.  Constitutional:      General: She is not in acute distress.    Appearance: She is well-developed. She is not diaphoretic.  HENT:     Head: Normocephalic.     Comments: There is swelling  to the left forehead just above the left eyebrow.  There is ecchymosis extending into both periorbital soft tissues.  There is also swelling and ecchymosis to the left upper lip.    Nose: Nose normal.     Comments: Nose appears normal without swelling.  There is no blood in the nostrils.    Mouth/Throat:     Comments: Dentition is intact with no chipped or missing teeth. Eyes:     Extraocular Movements: Extraocular movements intact.     Pupils: Pupils are equal, round, and reactive to light.     Comments: There is no diplopia on upward gaze.  Cardiovascular:     Rate and Rhythm: Normal rate and regular rhythm.     Heart sounds: No murmur heard.  No friction rub. No gallop.   Pulmonary:     Effort: Pulmonary effort is normal. No respiratory distress.     Breath sounds: Normal breath sounds. No wheezing.  Abdominal:     General: Bowel sounds are normal. There is no distension.     Palpations: Abdomen is soft.     Tenderness: There is no abdominal tenderness.  Musculoskeletal:        General: Normal range of motion.     Cervical back: Normal range of motion and neck supple.  Skin:    General: Skin is warm and dry.  Neurological:     General: No focal deficit present.     Mental Status: She is alert and oriented to person, place, and time.     Cranial Nerves: No cranial nerve deficit.     Motor: No weakness.     Coordination: Coordination normal.     ED Results / Procedures /  Treatments   Labs (all labs ordered are listed, but only abnormal results are displayed) Labs Reviewed - No data to display  EKG EKG Interpretation  Date/Time:  Thursday January 08 2020 10:09:01 EST Ventricular Rate:  71 PR Interval:    QRS Duration: 80 QT Interval:  438 QTC Calculation: 476 R Axis:   -23 Text Interpretation: Sinus rhythm Borderline left axis deviation Low voltage, extremity and precordial leads Consider anterior infarct Confirmed by Veryl Speak [phone]) on 01/08/2020 11:03:10 AM   Radiology No results found.  Procedures Procedures (including critical care time)  Medications Ordered in ED Medications - No data to display  ED Course  I have reviewed the triage vital signs and the nursing notes.  Pertinent labs & imaging results that were available during my care of the patient were reviewed by me and considered in my medical decision making (see chart for details).    MDM Rules/Calculators/A&P  Patient is a 74 year old female with above past medical history presenting after a fall.  Whether this was syncope or a mechanical fall with loss of consciousness, I am uncertain.  The injury occurred 3 days ago and patient is neurologically intact with negative work-up.  The only significant abnormality is a blood glucose of 280.  At this point, I feel as though discharge is appropriate.  Patient advised to continue her medications as before.  She is to keep a record of her blood sugars and follow-up with her primary doctor.  Final Clinical Impression(s) / ED Diagnoses Final diagnoses:  None    Rx / DC Orders ED Discharge Orders    None       Veryl Speak, MD 01/08/20 1115

## 2020-01-08 NOTE — ED Triage Notes (Signed)
Pt arrives ambulatory to ED reports that she had a fall on Monday in the parking lot of a restaurant, unsure of LOC, does not take blood thinner states that she was alone and drove herself home. Pt has bruising to both eyes with more swelling to left eyebrow, bruising to lips, and bruising to left wrist.

## 2020-01-08 NOTE — Discharge Instructions (Addendum)
Continue medications as previously prescribed.  Keep a record of your blood sugars and take this with you to your next doctor's appointment.  Return to the ER if you develop worsening headache, visual disturbances, or other new and concerning symptoms.

## 2020-01-09 NOTE — Progress Notes (Signed)
..  Sandostatin LAR 30mg  DOS: 01/05/2020. Per Nurse advisement device mal-function occurred due to medication leaking out side of syringe when dispensing. [employer] pending replacing device. Original device returned to 01/05/2020 per nurse to pharmacy. [employer]- Advised replacement paperwork being sent.

## 2020-01-09 NOTE — Telephone Encounter (Signed)
    Patient calling to request note starting 11/15--until appointment  on 12/3

## 2020-01-09 NOTE — Telephone Encounter (Signed)
Patient would like it emailed. [email or web address]

## 2020-01-09 NOTE — Telephone Encounter (Signed)
Called and left message for patient to return call to clinic with dates she is requesting to have off.  Please get dates if she calls back and let me know.  Thanks

## 2020-01-09 NOTE — Telephone Encounter (Signed)
Called and left message for patient. Asked her to call me back and let me know if she wants to come and pick up note, mailed or emailed to her. Please let me know her response.

## 2020-01-09 NOTE — Telephone Encounter (Signed)
This is a long time - if this will require any disability or fmla paper work she needs to come in next week.  Letter written

## 2020-01-12 NOTE — Telephone Encounter (Signed)
Emailed to patient this morning.

## 2020-01-18 ENCOUNTER — Other Ambulatory Visit: Payer: Self-pay

## 2020-01-18 ENCOUNTER — Encounter (HOSPITAL_BASED_OUTPATIENT_CLINIC_OR_DEPARTMENT_OTHER): Payer: Self-pay | Admitting: Emergency Medicine

## 2020-01-18 ENCOUNTER — Emergency Department (HOSPITAL_BASED_OUTPATIENT_CLINIC_OR_DEPARTMENT_OTHER)
Admission: EM | Admit: 2020-01-18 | Discharge: 2020-01-18 | Disposition: A | Discharge status: Home / Self Care | Payer: Medicare HMO | Attending: Emergency Medicine | Admitting: Emergency Medicine

## 2020-01-18 ENCOUNTER — Emergency Department (HOSPITAL_BASED_OUTPATIENT_CLINIC_OR_DEPARTMENT_OTHER): Payer: Medicare HMO

## 2020-01-18 DIAGNOSIS — E119 Type 2 diabetes mellitus without complications: Secondary | ICD-10-CM | POA: Diagnosis not present

## 2020-01-18 DIAGNOSIS — E039 Hypothyroidism, unspecified: Secondary | ICD-10-CM | POA: Insufficient documentation

## 2020-01-18 DIAGNOSIS — Z8589 Personal history of malignant neoplasm of other organs and systems: Secondary | ICD-10-CM | POA: Diagnosis not present

## 2020-01-18 DIAGNOSIS — J45909 Unspecified asthma, uncomplicated: Secondary | ICD-10-CM | POA: Diagnosis not present

## 2020-01-18 DIAGNOSIS — R0781 Pleurodynia: Secondary | ICD-10-CM | POA: Insufficient documentation

## 2020-01-18 DIAGNOSIS — R0602 Shortness of breath: Secondary | ICD-10-CM | POA: Insufficient documentation

## 2020-01-18 DIAGNOSIS — R059 Cough, unspecified: Secondary | ICD-10-CM | POA: Diagnosis not present

## 2020-01-18 LAB — BASIC METABOLIC PANEL
Anion gap: 11 (ref 5–15)
BUN: 17 mg/dL (ref 8–23)
CO2: 29 mmol/L (ref 22–32)
Calcium: 8.9 mg/dL (ref 8.9–10.3)
Chloride: 97 mmol/L — ABNORMAL LOW (ref 98–111)
Creatinine, Ser: 1.03 mg/dL — ABNORMAL HIGH (ref 0.44–1.00)
GFR, Estimated: 57 mL/min — ABNORMAL LOW (ref >60)
Glucose, Bld: 220 mg/dL — ABNORMAL HIGH (ref 70–99)
Potassium: 3.9 mmol/L (ref 3.5–5.1)
Sodium: 137 mmol/L (ref 135–145)

## 2020-01-18 LAB — CBC
HCT: 38.6 % (ref 36.0–46.0)
Hemoglobin: 12.9 g/dL (ref 12.0–15.0)
MCH: 31.2 pg (ref 26.0–34.0)
MCHC: 33.4 g/dL (ref 30.0–36.0)
MCV: 93.2 fL (ref 80.0–100.0)
Platelets: 225 10*3/uL (ref 150–400)
RBC: 4.14 MIL/uL (ref 3.87–5.11)
RDW: 13.3 % (ref 11.5–15.5)
WBC: 8.5 10*3/uL (ref 4.0–10.5)
nRBC: 0 % (ref 0.0–0.2)

## 2020-01-18 LAB — TROPONIN I (HIGH SENSITIVITY)
Troponin I (High Sensitivity): 2 ng/L (ref <18)
Troponin I (High Sensitivity): 2 ng/L (ref <18)

## 2020-01-18 IMAGING — DX DG CHEST 1V PORT
1 series · 1 of 1 positions shown · non-contrast
Comparison: [DATE].

CLINICAL DATA: Shortness of breath.

EXAM:
PORTABLE CHEST 1 VIEW

[chest ap]
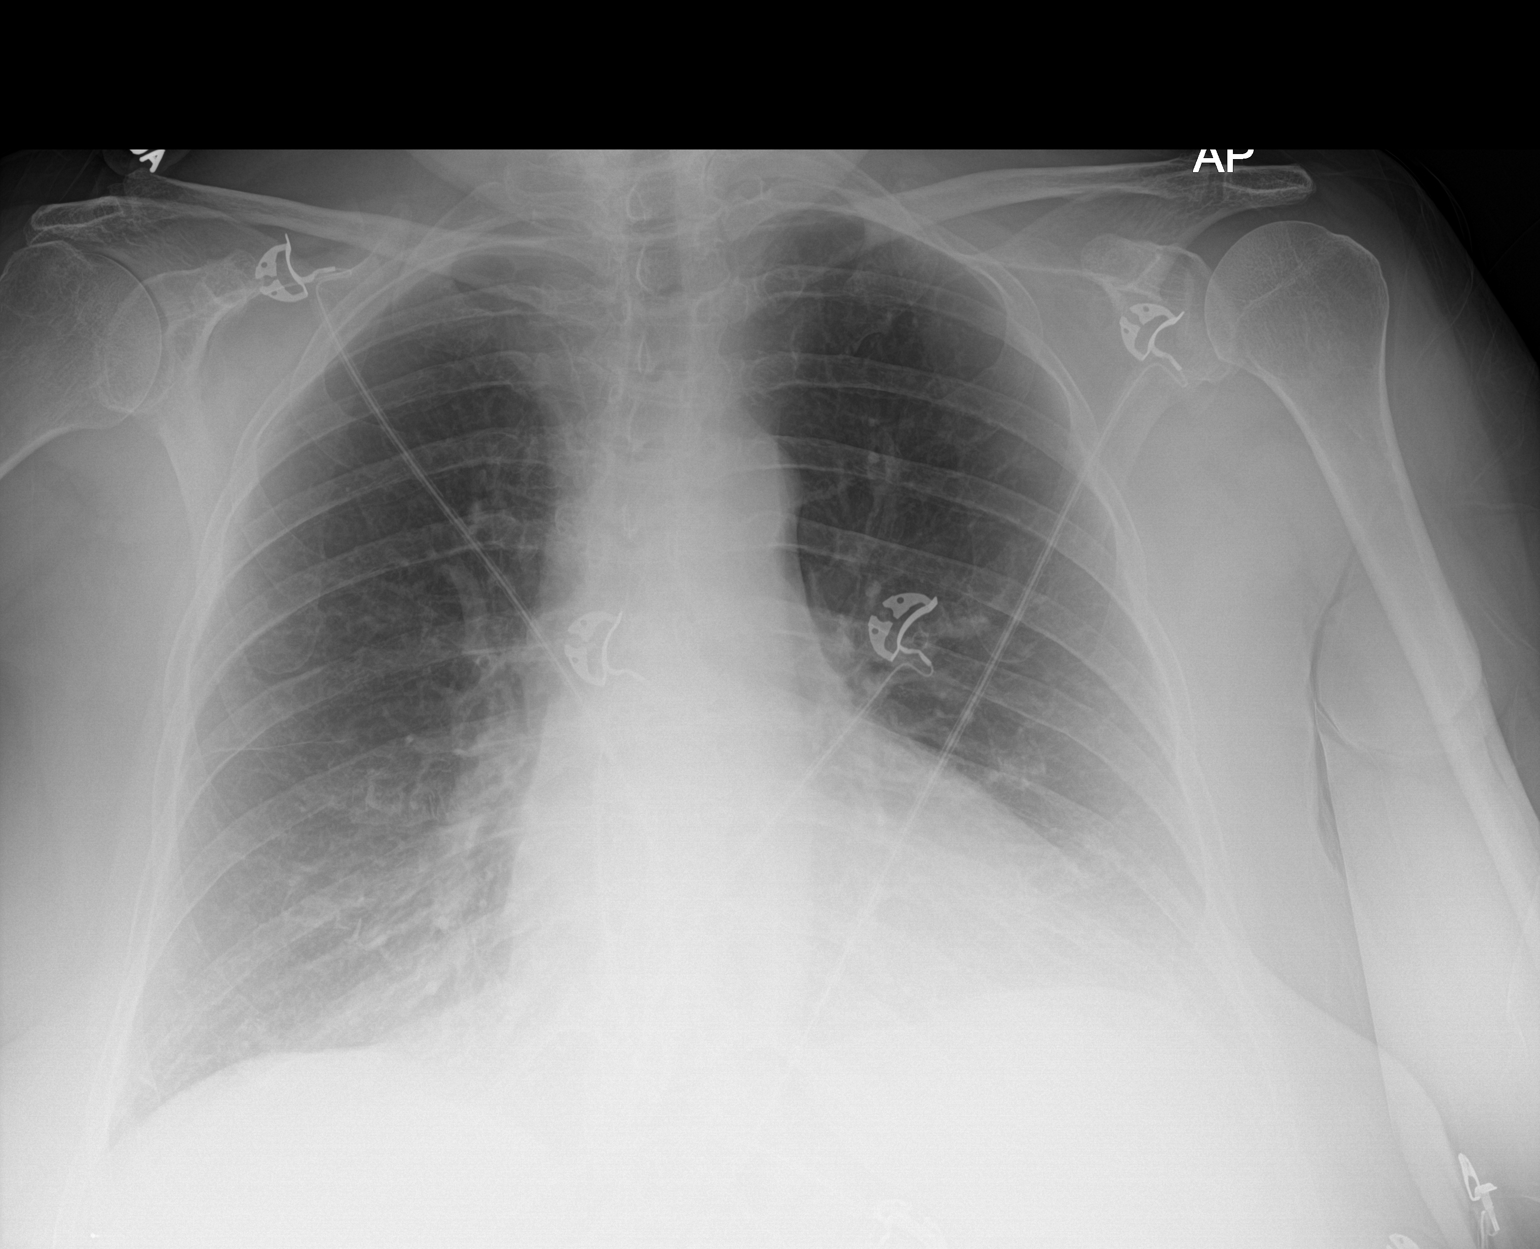

[1 of 1 positions shown; findings below may reference images not displayed]

FINDINGS: Similar borderline enlarged cardiac silhouette. No consolidation.
No visible pleural effusions or pneumothorax. The visualized
skeletal structures are unremarkable.
IMPRESSION: No evidence of acute cardiopulmonary disease.

## 2020-01-18 IMAGING — CT CT ANGIO CHEST
3 of 9 series · 17 of 36 positions shown · IV contrast (Omnipaque)
Comparison: Chest radiograph [DATE]; PET-CT [DATE]

CLINICAL DATA: Shortness of breath. Metastatic colon carcinoma.
Gastrointestinal stromal tumor

EXAM:
CT ANGIOGRAPHY CHEST WITH CONTRAST
TECHNIQUE: Multidetector CT imaging of the chest was performed using the
standard protocol during bolus administration of intravenous
contrast. Multiplanar CT image reconstructions and MIPs were
obtained to evaluate the vascular anatomy.
CONTRAST:  63mL OMNIPAQUE IOHEXOL 350 MG/ML SOLN

[Series 5: pe thins · axial · 0.85mm/px · z∈[-258,-40]mm · 14 of 252 slices shown]
[im 17/252  lung]
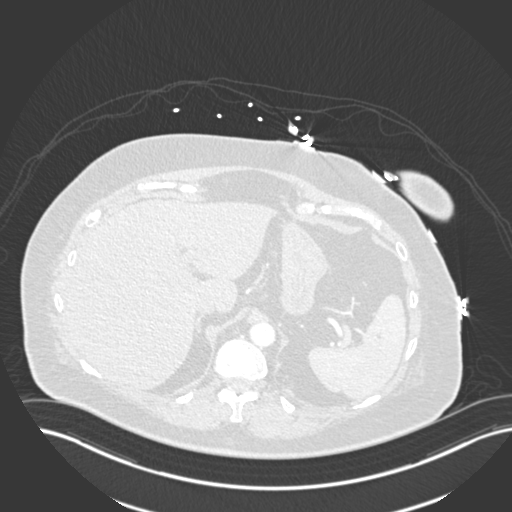
[im 34/252  mediastinal]
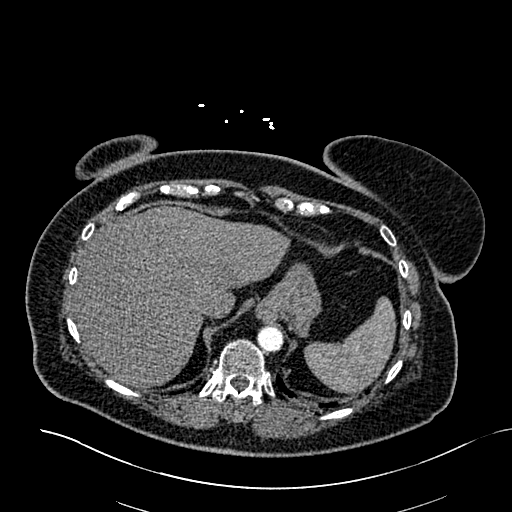
[im 51/252  lung]
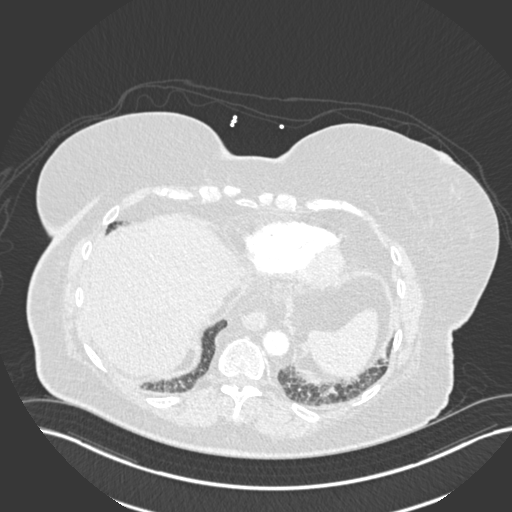
[im 67/252  mediastinal]
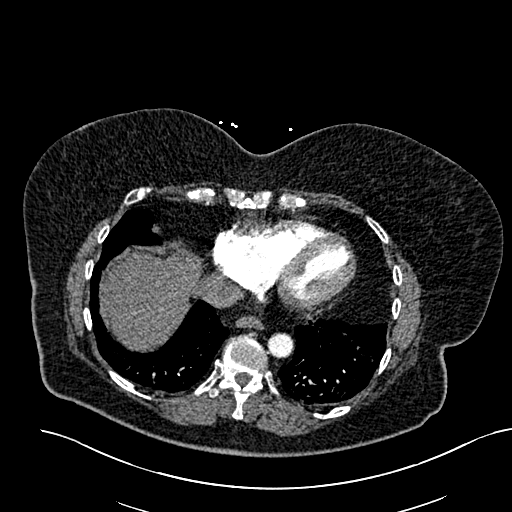
[im 84/252  lung]
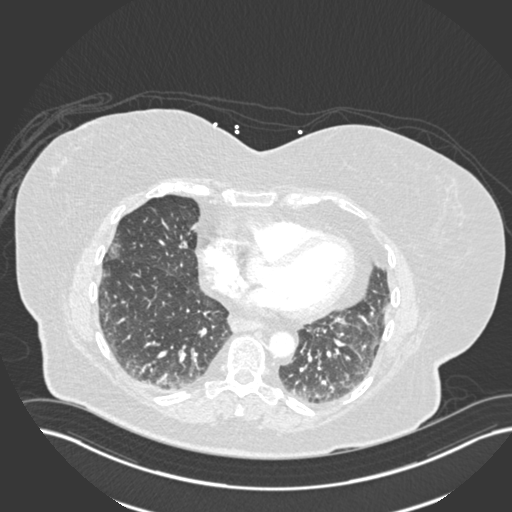
[im 101/252  mediastinal]
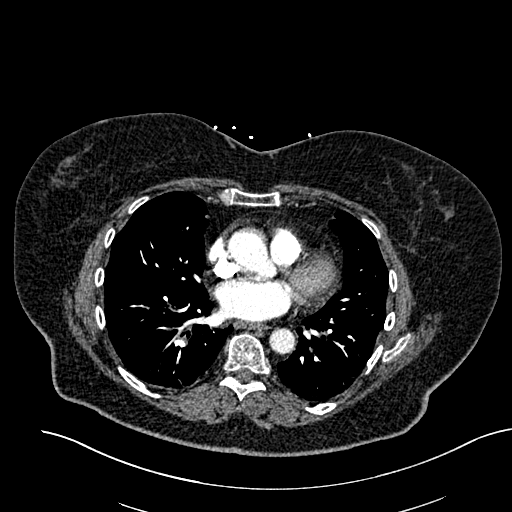
[im 118/252  lung]
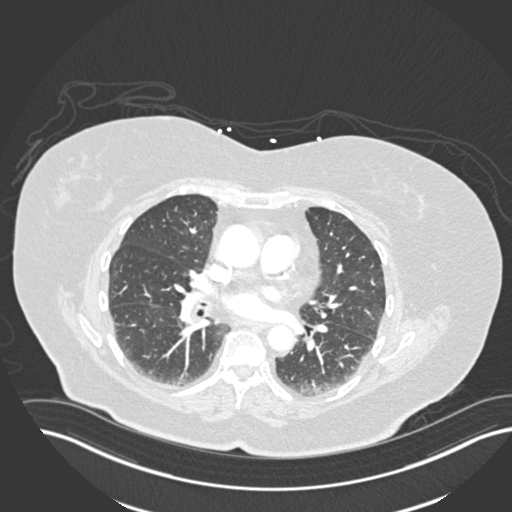
[im 134/252  mediastinal]
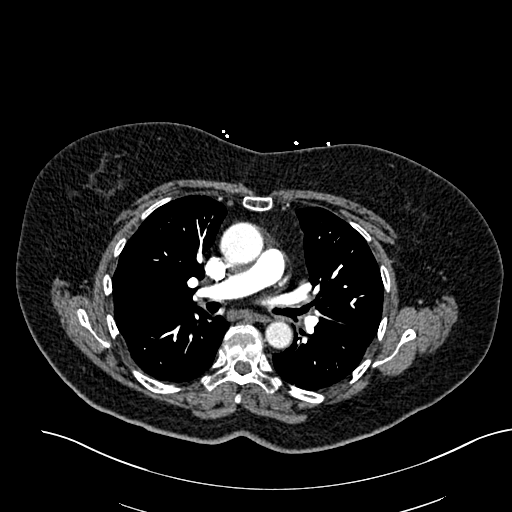
[im 151/252  lung]
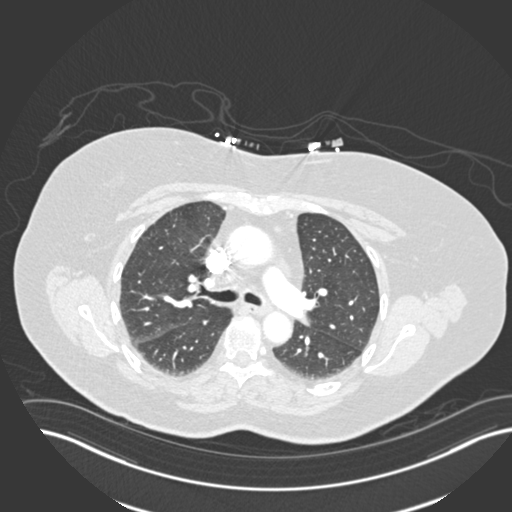
[im 168/252  mediastinal]
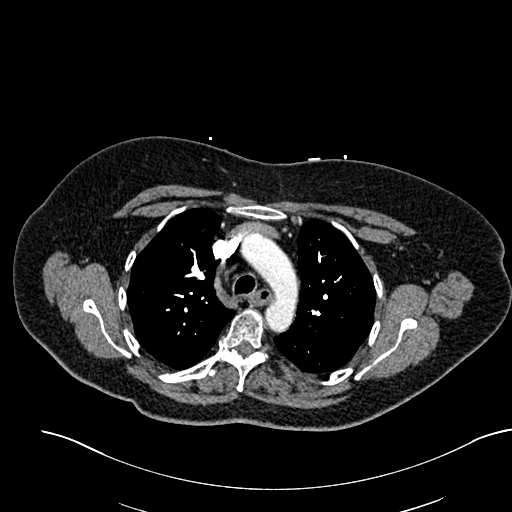
[im 185/252  lung]
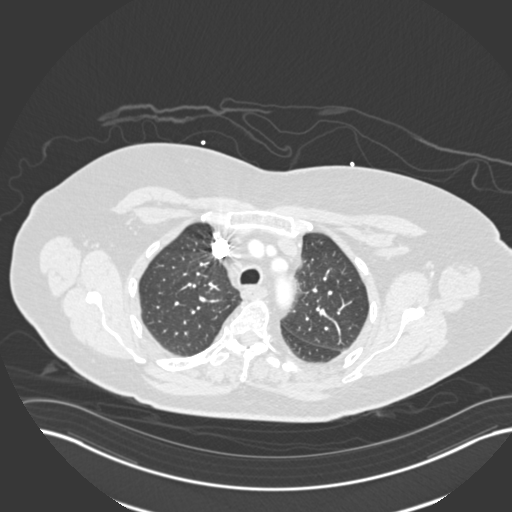
[im 201/252  mediastinal]
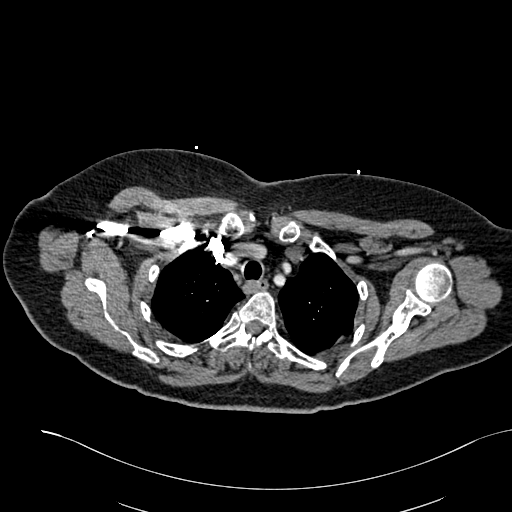
[im 218/252  lung]
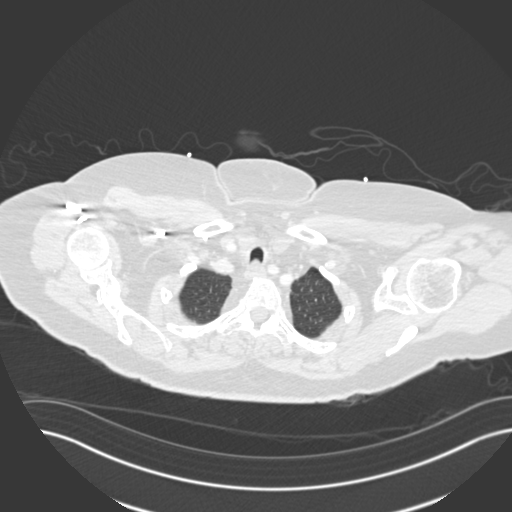
[im 235/252  mediastinal]
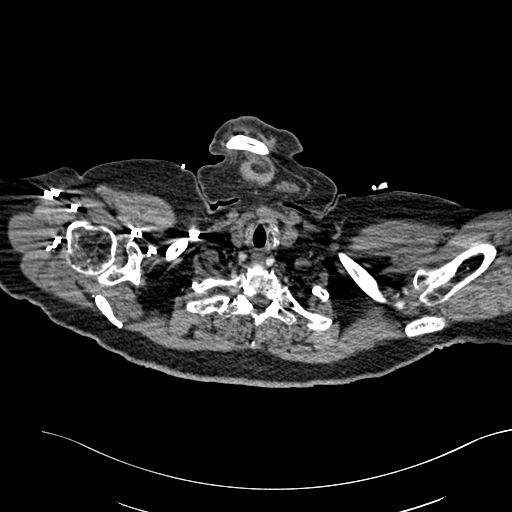

[Series 6: pe lung · axial · 0.98mm/px · z∈[-186,-132]mm · 2 of 73 slices shown]
[im 19/73  mediastinal]
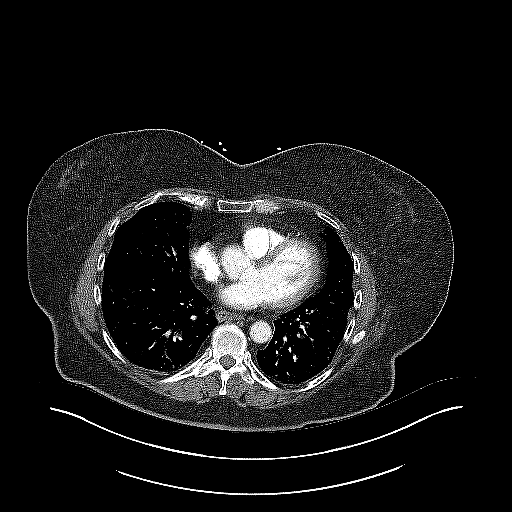
[im 37/73  mediastinal]
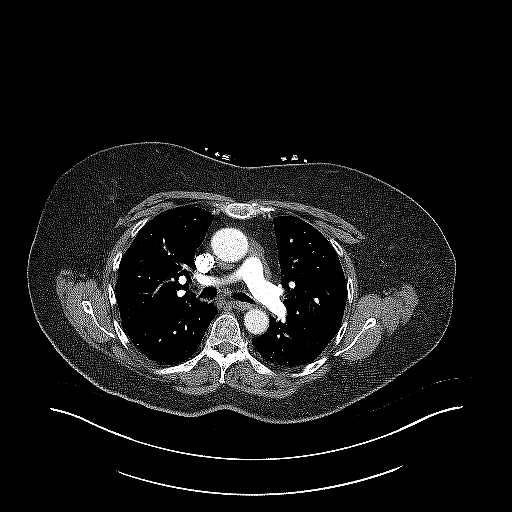

[Series 7: pe coronal mpr · coronal · 0.53mm/px · 1 of 149 slices shown]
[im 75/149  mediastinal]
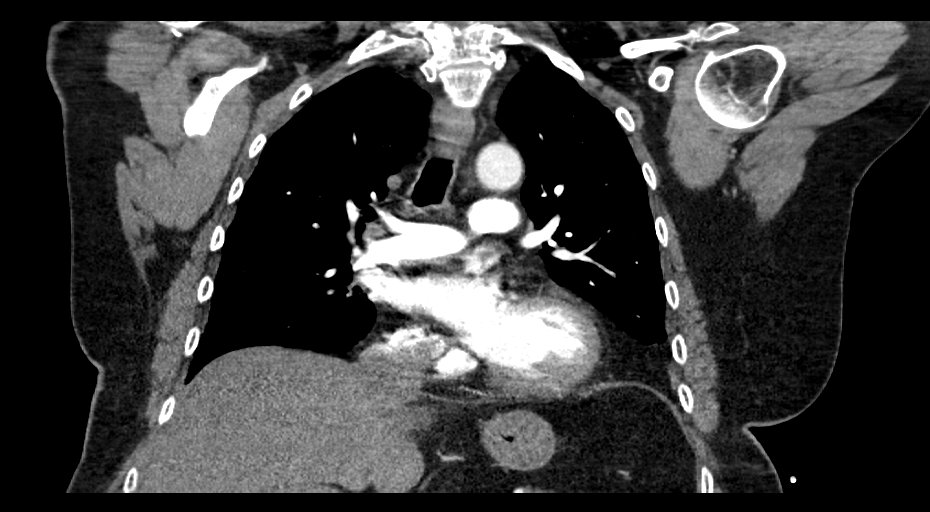

[17 of 36 positions shown; findings below may reference images not displayed]

FINDINGS: Cardiovascular: There is no demonstrable pulmonary embolus. There is
no thoracic aortic aneurysm or dissection. Visualized great vessels
appear normal except for a small focus of calcification at the
origin of the left common carotid artery. There is mild aortic
atherosclerosis. There are foci of coronary artery calcification.
There is no pericardial effusion or pericardial thickening.

Mediastinum/Nodes: No thyroid lesions are evident. There is a
subcarinal lymph node measuring 1 point 4 x 1.4 cm, also present on
recent PET study. Several subcentimeter lymph nodes are noted
anterior and to the right of the distal trachea. The largest of
these lymph nodes has a short axis diameter of 7 mm. No esophageal
lesions are appreciable.

Lungs/Pleura: There is a nodular opacity in the posterior segment of
the right upper lobe measuring 7 mm, a stable presumed metastatic
focus. There is a 7 mm nodular opacity in the lateral segment left
lower lobe seen on axial slice 54 series 6 and sagittal slice 148
series 9, stable, a presumed small metastasis. There are areas of
atelectatic change in the lower lung regions. No consolidation. No
pleural effusions.

Upper Abdomen: There is evident hepatic steatosis. Visualized upper
abdominal structures otherwise appear unremarkable.

Musculoskeletal: No blastic or lytic bone lesions. No chest wall
lesions.

Review of the MIP images confirms the above findings.
IMPRESSION: 1. No demonstrable pulmonary embolus. No thoracic aortic aneurysm or
dissection. There is aortic atherosclerosis as well as foci of
coronary artery calcification. A small calcification is also noted
in the proximal left common carotid artery.

2. Nodular opacities noted in the right upper lobe and left lower
lobe, likely small metastases. Areas of atelectatic change
bilaterally. No edema or consolidation. No pleural effusions.

3. Enlarged subcarinal lymph node. Subcentimeter paratracheal lymph
nodes. Concern for neoplastic etiology.

4.  Hepatic steatosis.

Aortic Atherosclerosis ([82]-[82]).

## 2020-01-18 MED ORDER — KETOROLAC TROMETHAMINE 30 MG/ML IJ SOLN
15.0000 mg | Freq: Once | INTRAMUSCULAR | Status: AC
  Administered 2020-01-18: 15 mg via INTRAVENOUS
  Filled 2020-01-18: qty 1

## 2020-01-18 MED ORDER — NAPROXEN 375 MG PO TABS: 375.0000 mg | ORAL_TABLET | Freq: Two times a day (BID) | ORAL | 0 refills | Status: AC

## 2020-01-18 MED ORDER — OCTREOTIDE ACETATE 20 MG IM KIT
20.0000 mg | PACK | Freq: Once | INTRAMUSCULAR | Status: DC
  Filled 2020-01-18: qty 1

## 2020-01-18 MED ORDER — IOHEXOL 350 MG/ML SOLN
100.0000 mL | Freq: Once | INTRAVENOUS | Status: AC | PRN
  Administered 2020-01-18: 63 mL via INTRAVENOUS

## 2020-01-18 MED ORDER — CETIRIZINE HCL 10 MG PO TABS: 10.0000 mg | ORAL_TABLET | Freq: Every day | ORAL | 0 refills | Status: AC

## 2020-01-18 NOTE — Discharge Instructions (Addendum)
The x-ray is showing that you might have couple of nodules in your lung that could be sign of cancer.  Please discuss the CT findings with your oncologist when you see him next Take the medications prescribed to see if it gives you relief.

## 2020-01-18 NOTE — ED Notes (Signed)
She returns from CT at this time. She is in no distress.

## 2020-01-18 NOTE — ED Provider Notes (Signed)
[facility] Emergency Department Provider Note MRN:  [id number]  Arrival date & time: 01/18/20     Chief Complaint   Shortness of Breath   History of Present Illness   Jessica Farrell is a 74 y.o. year-old female with a history of diabetes, neuroendocrine tumor presenting to the ED with chief complaint of shortness of breath.  1 day of central chest pain described as a tightness, worse with deep breathing.  Associated with cough, shortness of breath.  Denies headache or vision change, no abdominal pain, no numbness or weakness to the arms or legs, no leg pain or swelling.  Feels similar to pneumonia in the past.  Symptoms are constant, moderate, no other exacerbating or alleviating factors.  Review of Systems  A complete 10 system review of systems was obtained and all systems are negative except as noted in the HPI and PMH.   Patient's Health History    Past Medical History:  Diagnosis Date  . Asthma   . Cellulitis March  2014   Left foot and ankle  . colon ca dx'd 01/2014  . Colon polyps    2015   . Diabetes mellitus ([place]) 05/05/2012   Type II  . Hyperlipidemia   . Morbid obesity ([place])   . Thyroid disease 1990's   HypoThyroidism    Past Surgical History:  Procedure Laterality Date  . CHOLECYSTECTOMY N/A 05/02/2017   Procedure: LAPAROSCOPIC CHOLECYSTECTOMY WITH INTRAOPERATIVE CHOLANGIOGRAM, VENTRAL HERNIA REPAIR;  Surgeon: Jovita Kussmaul, MD;  Location: [facility];  Service: General;  Laterality: N/A;  . COLON RESECTION N/A 02/03/2014   Procedure: LAPAROSCOPIC ASSISTED BOWEL RESECTION;  Surgeon: Jackolyn Confer, MD;  Location: [facility];  Service: General;  Laterality: N/A;  . FRACTURE SURGERY  2000   Ankle  . TOOTH EXTRACTION     wisdom Teeth    Family History  Problem Relation Age of Onset  . Alzheimer's disease Father   . Heart disease Mother   . Arthritis Mother   . Hypertension Mother   . Alzheimer's disease Sister   . Cancer Sister 62         breast cancer   . Heart disease Brother        Heart Disease before age 18  . Breast cancer Sister   . Colon cancer Neg Hx   . Esophageal cancer Neg Hx   . Rectal cancer Neg Hx   . Stomach cancer Neg Hx     Social History   Socioeconomic History  . Marital status: Divorced    Spouse name: Not on file  . Number of children: Not on file  . Years of education: Not on file  . Highest education level: Not on file  Occupational History  . Not on file  Tobacco Use  . Smoking status: Never Smoker  . Smokeless tobacco: Never Used  Vaping Use  . Vaping Use: Never used  Substance and Sexual Activity  . Alcohol use: No  . Drug use: No  . Sexual activity: Not Currently    Birth control/protection: Post-menopausal  Other Topics Concern  . Not on file  Social History Narrative  . Not on file   Social Determinants of Health   Financial Resource Strain:   . Difficulty of Paying Living Expenses: Not on file  Food Insecurity:   . Worried About Charity fundraiser in the Last Year: Not on file  . Ran Out of Food in the Last Year: Not on file  Transportation Needs:   . Film/video editor (Medical): Not on file  . Lack of Transportation (Non-Medical): Not on file  Physical Activity:   . Days of Exercise per Week: Not on file  . Minutes of Exercise per Session: Not on file  Stress:   . Feeling of Stress : Not on file  Social Connections:   . Frequency of Communication with Friends and Family: Not on file  . Frequency of Social Gatherings with Friends and Family: Not on file  . Attends Religious Services: Not on file  . Active Member of Clubs or Organizations: Not on file  . Attends Archivist Meetings: Not on file  . Marital Status: Not on file  Intimate Partner Violence:   . Fear of Current or Ex-Partner: Not on file  . Emotionally Abused: Not on file  . Physically Abused: Not on file  . Sexually Abused: Not on file     Physical Exam   Vitals:    01/18/20 0611  BP: 139/77  Pulse: 83  Resp: 18  Temp: 98.5 F (36.9 C)  SpO2: 96%    CONSTITUTIONAL: Well-appearing, NAD NEURO:  Alert and oriented x 3, no focal deficits EYES:  eyes equal and reactive ENT/NECK:  no LAD, no JVD CARDIO: Regular rate, well-perfused, normal S1 and S2 PULM:  CTAB no wheezing or rhonchi GI/GU:  normal bowel sounds, non-distended, non-tender MSK/SPINE:  No gross deformities, no edema SKIN:  no rash, atraumatic PSYCH:  Appropriate speech and behavior  *Additional and/or pertinent findings included in MDM below  Diagnostic and Interventional Summary    EKG Interpretation  Date/Time:    Ventricular Rate:    PR Interval:    QRS Duration:   QT Interval:    QTC Calculation:   R Axis:     Text Interpretation:        Labs Reviewed  BASIC METABOLIC PANEL - Abnormal; Notable for the following components:      Result Value   Chloride 97 (*)    Glucose, Bld 220 (*)    Creatinine, Ser 1.03 (*)    GFR, Estimated 57 (*)    All other components within normal limits  CBC  TROPONIN I (HIGH SENSITIVITY)    DG Chest Port 1 View  Final Result    CT ANGIO CHEST PE W OR WO CONTRAST    (Results Pending)    Medications - No data to display   Procedures  /  Critical Care Procedures  ED Course and Medical Decision Making  I have reviewed the triage vital signs, the nursing notes, and pertinent available records from the EMR.  Listed above are laboratory and imaging tests that I personally ordered, reviewed, and interpreted and then considered in my medical decision making (see below for details).  Cough, pleuritic chest pain, feels similar to prior pneumonia however chest x-ray is clear.  Given patient's metastatic cancer, there is concern for PE.  Awaiting CTA.  Signed out to oncoming provider at shift change.  Would be a candidate for discharge if work-up negative.       Barth Kirks. Sedonia Small, [place] [email or web address]  Final Clinical Impressions(s) / ED Diagnoses     ICD-10-CM   1. Pleuritic chest pain  R07.81     ED Discharge Orders    None       Discharge Instructions Discussed with and Provided to Patient:   Discharge Instructions   None  Maudie Flakes, MD 01/18/20 [phone]

## 2020-01-18 NOTE — ED Triage Notes (Signed)
Reports shortness of breath and pain with inspiration since yesterday. Reports cough, no fever. States hx pneumonia, feels the same.

## 2020-01-18 NOTE — ED Provider Notes (Signed)
  Physical Exam  BP 139/77 (BP Location: Right Arm)   Pulse 83   Temp 98.5 F (36.9 C) (Oral)   Resp 18   SpO2 96%   Physical Exam  ED Course/Procedures     Procedures  MDM    Assuming care of patient from Dr. Sedonia Small   Patient in the ED for shortness of breath. Workup thus far shows normal labs and normal high-sensitivity troponin.  Patient has history of neuroendocrine cancer.  Concerning findings are as following : None Important pending results are CT PE, as we would like to rule out pulmonary embolus as a cause for shortness of breath.  According to Dr. Sedonia Small, plan is to discharge the patient if CT PE is negative.  Patient had no complains, no concerns from the nursing side. Will continue to monitor.     Varney Biles, MD 01/18/20 [id number]

## 2020-01-22 ENCOUNTER — Other Ambulatory Visit: Payer: Self-pay

## 2020-01-22 NOTE — Progress Notes (Signed)
Subjective:    Patient ID: Jessica Farrell, female    DOB: 08-18-1945, 74 y.o.   MRN: [id number]   This visit occurred during the SARS-CoV-2 public health emergency.  Safety protocols were in place, including screening questions prior to the visit, additional usage of staff PPE, and extensive cleaning of exam room while observing appropriate contact time as indicated for disinfecting solutions.    HPI She is here for a physical exam.   She is coughing up a lot phlegm.  She thinks it is a flare of her allergies or asthma.  She did have a recent CT of the chest and chest x-ray and there is no evidence of pneumonia, pulmonary embolism.  Her sugars have been over 200 with her cancer treatment.  She is taking her Metformin.  She is compliant with a diabetic diet.  She has been out of her thyroid medication for the past week.   Medications and allergies reviewed with patient and updated if appropriate.  Patient Active Problem List   Diagnosis Date Noted  . Mouth abscess 02/25/2019  . Cellulitis, face 02/25/2019  . Tingling in extremities 05/23/2018  . Osteopenia 03/27/2018  . Umbilical hernia without obstruction or gangrene 11/21/2017  . Onychomycosis 05/22/2017  . Athlete's foot 05/22/2017  . Carcinoid tumor of cecum 01/26/2017  . 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 ([place]) 02/18/2014  . Chronic venous insufficiency 08/02/2012  . HLD (hyperlipidemia) 05/15/2012  . Cellulitis of left anterior lower leg 05/05/2012  . Diabetes mellitus ([place]) 05/05/2012  . Hypothyroidism 05/05/2012  . Asthma 05/05/2012    Current Outpatient Medications on File Prior to Visit  Medication Sig Dispense Refill  . albuterol (PROVENTIL HFA;VENTOLIN HFA) 108 (90 Base) MCG/ACT inhaler Inhale 2 puffs into the lungs every 6 (six) hours as needed for wheezing or shortness of breath. 1 Inhaler 8  . atorvastatin (LIPITOR) 10 MG tablet Take 1 tablet (10 mg total) by mouth daily. Need office  visit for more refills. 30 tablet 0  . blood glucose meter kit and supplies KIT Dispense based on patient and insurance preference. Use up to 2 times daily as directed. DX code: E11.9 1 each 0  . cetirizine (ZYRTEC) 10 MG tablet Take 1 tablet (10 mg total) by mouth daily. 15 tablet 0  . ciclopirox (PENLAC) 8 % solution Apply topically at bedtime. Apply over nail & surrounding skin. Apply daily over previous coat. After 7 days, may remove w alcohol &continue 6.6 mL 5  . gabapentin (NEURONTIN) 100 MG capsule Take 1 capsule (100 mg total) by mouth at bedtime. 30 capsule 5  . glucose blood test strip Based on insurance preference. Use as instructed once daily to check sugars. Dx E11.9 100 each 12  . Lancets (ONETOUCH ULTRASOFT) lancets Use as instructed once daily to check sugars. Dx E11.9 100 each 12  . levothyroxine (SYNTHROID) 137 MCG tablet TAKE 1 TABLET DAILY BEFORE BREAKFAST. NEED OFFICE VISIT WITH LABS  30 tablet 0  . loperamide (IMODIUM) 1 MG/5ML solution Take by mouth as needed for diarrhea or loose stools.    . metFORMIN (GLUCOPHAGE) 500 MG tablet TAKE 1 TABLET TWICE DAILY WITH MEALS 60 tablet 0  . montelukast (SINGULAIR) 10 MG tablet Take 1 tablet (10 mg total) by mouth at bedtime. 90 tablet 1  . Multiple Vitamins-Minerals (CENTRUM SILVER PO) Take 1 tablet by mouth daily.    . naproxen (NAPROSYN) 375 MG tablet Take 1 tablet (375 mg total) by mouth 2 (two) times  daily. 20 tablet 0  . Phenylephrine HCl (SINEX REGULAR NA) Place 1 spray into the nose daily as needed (ALLERGIES).    . promethazine (PHENERGAN) 12.5 MG tablet Take 1 tablet (12.5 mg total) by mouth every 6 (six) hours as needed for nausea or vomiting. 30 tablet 0  . terbinafine (LAMISIL AT) 1 % cream Apply 1 application topically 2 (two) times daily. 42 g 2  . umeclidinium-vilanterol (ANORO ELLIPTA) 62.5-25 MCG/INH AEPB Inhale 1 puff daily into the lungs. 60 each 5   No current facility-administered medications on file prior to  visit.    Past Medical History:  Diagnosis Date  . Asthma   . Cellulitis March  2014   Left foot and ankle  . colon ca dx'd 01/2014  . Colon polyps    2015   . Diabetes mellitus ([place]) 05/05/2012   Type II  . Hyperlipidemia   . Morbid obesity ([place])   . Thyroid disease 1990's   HypoThyroidism    Past Surgical History:  Procedure Laterality Date  . CHOLECYSTECTOMY N/A 05/02/2017   Procedure: LAPAROSCOPIC CHOLECYSTECTOMY WITH INTRAOPERATIVE CHOLANGIOGRAM, VENTRAL HERNIA REPAIR;  Surgeon: Jovita Kussmaul, MD;  Location: [facility];  Service: General;  Laterality: N/A;  . COLON RESECTION N/A 02/03/2014   Procedure: LAPAROSCOPIC ASSISTED BOWEL RESECTION;  Surgeon: Jackolyn Confer, MD;  Location: [facility];  Service: General;  Laterality: N/A;  . FRACTURE SURGERY  2000   Ankle  . TOOTH EXTRACTION     [facility]is[facility] Teeth    Social History   Socioeconomic History  . Marital status: Divorced    Spouse name: Not on file  . Number of children: Not on file  . Years of education: Not on file  . Highest education level: Not on file  Occupational History  . Not on file  Tobacco Use  . Smoking status: Never Smoker  . Smokeless tobacco: Never Used  Vaping Use  . Vaping Use: Never used  Substance and Sexual Activity  . Alcohol use: No  . Drug use: No  . Sexual activity: Not Currently    Birth control/protection: Post-menopausal  Other Topics Concern  . Not on file  Social History Narrative  . Not on file   Social Determinants of Health   Financial Resource Strain:   . Difficulty of Paying Living Expenses: Not on file  Food Insecurity:   . Worried About Charity fundraiser in the Last Year: Not on file  . Ran Out of Food in the Last Year: Not on file  Transportation Needs:   . Lack of Transportation (Medical): Not on file  . Lack of Transportation (Non-Medical): Not on file  Physical Activity:   . Days of Exercise per Week: Not on file  . Minutes of Exercise per Session: Not on file   Stress:   . Feeling of Stress : Not on file  Social Connections:   . Frequency of Communication with Friends and Family: Not on file  . Frequency of Social Gatherings with Friends and Family: Not on file  . Attends Religious Services: Not on file  . Active Member of Clubs or Organizations: Not on file  . Attends Archivist Meetings: Not on file  . Marital Status: Not on file    Family History  Problem Relation Age of Onset  . Alzheimer's disease Father   . Heart disease Mother   . Arthritis Mother   . Hypertension Mother   . Alzheimer's disease Sister   . Cancer  Sister 34       breast cancer   . Heart disease Brother        Heart Disease before age 54  . Breast cancer Sister   . Colon cancer Neg Hx   . Esophageal cancer Neg Hx   . Rectal cancer Neg Hx   . Stomach cancer Neg Hx     Review of Systems  Constitutional: Positive for fatigue. Negative for chills and fever.  Eyes: Positive for visual disturbance (blurry).  Respiratory: Positive for cough, shortness of breath and wheezing. Negative for choking.        Chest tightness  Cardiovascular: Positive for leg swelling (mild). Negative for chest pain and palpitations.  Gastrointestinal: Positive for diarrhea (with metformin). Negative for abdominal pain, blood in stool, constipation and nausea.       No gerd  Genitourinary: Negative for dysuria.  Musculoskeletal: Negative for arthralgias and back pain (standing long period of time).  Skin: Positive for rash (under breasts - not itchy - resolved).  Neurological: Positive for light-headedness (when she gets up - ? from fall, cancer treatment, sugars) and headaches.  Psychiatric/Behavioral: Negative for dysphoric mood. The patient is not nervous/anxious.        Objective:   Vitals:   01/23/20 0933  BP: 118/72  Pulse: 92  Temp: 98.1 F (36.7 C)  SpO2: 95%   Filed Weights   01/23/20 0933  Weight: 186 lb (84.4 kg)   Body mass index is 30.95 kg/m.  BP  Readings from Last 3 Encounters:  01/23/20 118/72  01/18/20 106/73  01/08/20 120/76    Wt Readings from Last 3 Encounters:  01/23/20 186 lb (84.4 kg)  01/08/20 175 lb (79.4 kg)  10/30/19 184 lb 12.8 oz (83.8 kg)     Physical Exam Constitutional: She appears well-developed and well-nourished. No distress.  HENT:  Head: Normocephalic and atraumatic.  Right Ear: External ear normal. Normal ear canal and TM Left Ear: External ear normal.  Normal ear canal and TM Mouth/Throat: Oropharynx is clear and moist.  Eyes: Conjunctivae and EOM are normal.  Neck: Neck supple. No tracheal deviation present. No thyromegaly present.  No carotid bruit  Cardiovascular: Normal rate, regular rhythm and normal heart sounds.   No murmur heard.  No edema. Pulmonary/Chest: Effort normal and breath sounds normal. No respiratory distress. She has no wheezes. She has no rales.  Intermittent wet sounding cough. Breast: deferred   Abdominal: Soft. She exhibits no distension. There is no tenderness.  Lymphadenopathy: She has no cervical adenopathy.  Skin: Skin is warm and dry. She is not diaphoretic.  Psychiatric: She has a normal mood and affect. Her behavior is normal.        Assessment & Plan:   Physical exam: Screening blood work    ordered Immunizations  flu vac today Colonoscopy  n/a Mammogram  Not up to date Dexa  Up to date  Eye exams  Scheduled for next week Exercise  None- very fatigued due to cancer treatment Weight  Obese - undergoing chemo Substance abuse  none      See Problem List for Assessment and Plan of chronic medical problems.

## 2020-01-22 NOTE — Patient Instructions (Addendum)
Flu immunization administered today.   Medications changes include :   Increase the metformin to 1000 mg twice daily with food. Start rybelsus 3 mg daily - after one month we can increase this to 7 mg.    Use advair twice daily ( maintenance inhaler for your asthma) Use albuterol inhaler every 4 hours as needed    Your prescription(s) have been submitted to your pharmacy.    Please followup in 3 months    Health Maintenance, Female Adopting a healthy lifestyle and getting preventive care are important in promoting health and wellness. Ask your health care provider about:  The right schedule for you to have regular tests and exams.  Things you can do on your own to prevent diseases and keep yourself healthy. What should I know about diet, weight, and exercise? Eat a healthy diet   Eat a diet that includes plenty of vegetables, fruits, low-fat dairy products, and lean protein.  Do not eat a lot of foods that are high in solid fats, added sugars, or sodium. Maintain a healthy weight Body mass index (BMI) is used to identify weight problems. It estimates body fat based on height and weight. Your health care provider can help determine your BMI and help you achieve or maintain a healthy weight. Get regular exercise Get regular exercise. This is one of the most important things you can do for your health. Most adults should:  Exercise for at least 150 minutes each week. The exercise should increase your heart rate and make you sweat (moderate-intensity exercise).  Do strengthening exercises at least twice a week. This is in addition to the moderate-intensity exercise.  Spend less time sitting. Even light physical activity can be beneficial. Watch cholesterol and blood lipids Have your blood tested for lipids and cholesterol at 74 years of age, then have this test every 5 years. Have your cholesterol levels checked more often if:  Your lipid or cholesterol levels are  high.  You are older than 74 years of age.  You are at high risk for heart disease. What should I know about cancer screening? Depending on your health history and family history, you may need to have cancer screening at various ages. This may include screening for:  Breast cancer.  Cervical cancer.  Colorectal cancer.  Skin cancer.  Lung cancer. What should I know about heart disease, diabetes, and high blood pressure? Blood pressure and heart disease  High blood pressure causes heart disease and increases the risk of stroke. This is more likely to develop in people who have high blood pressure readings, are of African descent, or are overweight.  Have your blood pressure checked: ? Every 3-5 years if you are 17-26 years of age. ? Every year if you are 7 years old or older. Diabetes Have regular diabetes screenings. This checks your fasting blood sugar level. Have the screening done:  Once every three years after age 70 if you are at a normal weight and have a low risk for diabetes.  More often and at a younger age if you are overweight or have a high risk for diabetes. What should I know about preventing infection? Hepatitis B If you have a higher risk for hepatitis B, you should be screened for this virus. Talk with your health care provider to find out if you are at risk for hepatitis B infection. Hepatitis C Testing is recommended for:  Everyone born from [birth year] through 1965.  Anyone with known risk factors for  hepatitis C. Sexually transmitted infections (STIs)  Get screened for STIs, including gonorrhea and chlamydia, if: ? You are sexually active and are younger than 74 years of age. ? You are older than 74 years of age and your health care provider tells you that you are at risk for this type of infection. ? Your sexual activity has changed since you were last screened, and you are at increased risk for chlamydia or gonorrhea. Ask your health care provider if you  are at risk.  Ask your health care provider about whether you are at high risk for HIV. Your health care provider may recommend a prescription medicine to help prevent HIV infection. If you choose to take medicine to prevent HIV, you should first get tested for HIV. You should then be tested every 3 months for as long as you are taking the medicine. Pregnancy  If you are about to stop having your period (premenopausal) and you may become pregnant, seek counseling before you get pregnant.  Take 400 to 800 micrograms (mcg) of folic acid every day if you become pregnant.  Ask for birth control (contraception) if you want to prevent pregnancy. Osteoporosis and menopause Osteoporosis is a disease in which the bones lose minerals and strength with aging. This can result in bone fractures. If you are 29 years old or older, or if you are at risk for osteoporosis and fractures, ask your health care provider if you should:  Be screened for bone loss.  Take a calcium or vitamin D supplement to lower your risk of fractures.  Be given hormone replacement therapy (HRT) to treat symptoms of menopause. Follow these instructions at home: Lifestyle  Do not use any products that contain nicotine or tobacco, such as cigarettes, e-cigarettes, and chewing tobacco. If you need help quitting, ask your health care provider.  Do not use street drugs.  Do not share needles.  Ask your health care provider for help if you need support or information about quitting drugs. Alcohol use  Do not drink alcohol if: ? Your health care provider tells you not to drink. ? You are pregnant, may be pregnant, or are planning to become pregnant.  If you drink alcohol: ? Limit how much you use to 0-1 drink a day. ? Limit intake if you are breastfeeding.  Be aware of how much alcohol is in your drink. In the [place] one drink equals one 12 oz bottle of beer (355 mL), one 5 oz glass of wine (148 mL), or one 1 oz glass of hard  liquor (44 mL). General instructions  Schedule regular health, dental, and eye exams.  Stay current with your vaccines.  Tell your health care provider if: ? You often feel depressed. ? You have ever been abused or do not feel safe at home. Summary  Adopting a healthy lifestyle and getting preventive care are important in promoting health and wellness.  Follow your health care provider's instructions about healthy diet, exercising, and getting tested or screened for diseases.  Follow your health care provider's instructions on monitoring your cholesterol and blood pressure. This information is not intended to replace advice given to you by your health care provider. Make sure you discuss any questions you have with your health care provider. Document Revised: 01/30/2018 Document Reviewed: 01/30/2018 Elsevier Patient Education  2020 [employer].

## 2020-01-23 ENCOUNTER — Ambulatory Visit (INDEPENDENT_AMBULATORY_CARE_PROVIDER_SITE_OTHER): Payer: Medicare HMO | Admitting: Internal Medicine

## 2020-01-23 ENCOUNTER — Encounter: Payer: Self-pay | Admitting: Internal Medicine

## 2020-01-23 VITALS — BP 118/72 | HR 92 | Temp 98.1°F | Ht 65.0 in | Wt 186.0 lb

## 2020-01-23 DIAGNOSIS — Z0000 Encounter for general adult medical examination without abnormal findings: Secondary | ICD-10-CM

## 2020-01-23 DIAGNOSIS — E7849 Other hyperlipidemia: Secondary | ICD-10-CM

## 2020-01-23 DIAGNOSIS — E1165 Type 2 diabetes mellitus with hyperglycemia: Secondary | ICD-10-CM | POA: Diagnosis not present

## 2020-01-23 DIAGNOSIS — M8589 Other specified disorders of bone density and structure, multiple sites: Secondary | ICD-10-CM

## 2020-01-23 DIAGNOSIS — E038 Other specified hypothyroidism: Secondary | ICD-10-CM | POA: Diagnosis not present

## 2020-01-23 DIAGNOSIS — Z23 Encounter for immunization: Secondary | ICD-10-CM

## 2020-01-23 DIAGNOSIS — J4541 Moderate persistent asthma with (acute) exacerbation: Secondary | ICD-10-CM | POA: Diagnosis not present

## 2020-01-23 DIAGNOSIS — C7B8 Other secondary neuroendocrine tumors: Secondary | ICD-10-CM | POA: Diagnosis not present

## 2020-01-23 DIAGNOSIS — J45901 Unspecified asthma with (acute) exacerbation: Secondary | ICD-10-CM | POA: Insufficient documentation

## 2020-01-23 MED ORDER — MONTELUKAST SODIUM 10 MG PO TABS: 10.0000 mg | ORAL_TABLET | Freq: Every day | ORAL | 1 refills | Status: DC

## 2020-01-23 MED ORDER — FLUTICASONE-SALMETEROL 100-50 MCG/DOSE IN AEPB: 1.0000 | INHALATION_SPRAY | Freq: Two times a day (BID) | RESPIRATORY_TRACT | 3 refills | Status: DC

## 2020-01-23 MED ORDER — GABAPENTIN 100 MG PO CAPS
100.0000 mg | ORAL_CAPSULE | Freq: Every day | ORAL | 1 refills | Status: AC
Start: 2020-01-23 — End: ?

## 2020-01-23 MED ORDER — ATORVASTATIN CALCIUM 10 MG PO TABS
10.0000 mg | ORAL_TABLET | Freq: Every day | ORAL | 1 refills | Status: DC
Start: 2020-01-23 — End: 2020-07-15

## 2020-01-23 MED ORDER — METFORMIN HCL 500 MG PO TABS: 1000.0000 mg | ORAL_TABLET | Freq: Two times a day (BID) | ORAL | 1 refills | Status: DC

## 2020-01-23 MED ORDER — LEVOTHYROXINE SODIUM 137 MCG PO TABS
ORAL_TABLET | ORAL | 1 refills | Status: DC
Start: 2020-01-23 — End: 2020-07-15

## 2020-01-23 MED ORDER — RYBELSUS 3 MG PO TABS: 3.0000 mg | ORAL_TABLET | Freq: Every day | ORAL | 0 refills | Status: DC

## 2020-01-23 MED ORDER — ALBUTEROL SULFATE HFA 108 (90 BASE) MCG/ACT IN AERS: 2.0000 | INHALATION_SPRAY | Freq: Four times a day (QID) | RESPIRATORY_TRACT | 8 refills | Status: DC | PRN

## 2020-01-23 NOTE — Addendum Note (Signed)
Addended by: Marcina Millard on: 01/23/2020 03:37 PM   Modules accepted: Orders

## 2020-01-23 NOTE — Assessment & Plan Note (Signed)
Chronic Restart atorvastatin 10 mg daily Will check lipid panel at her next visit Regular exercise and healthy diet encouraged

## 2020-01-23 NOTE — Assessment & Plan Note (Signed)
Chronic DEXA up-to-date Not able to exercise due to significant fatigue secondary to cancer treatments Taking multivitamin

## 2020-01-23 NOTE — Assessment & Plan Note (Signed)
Chronic Has been out of her thyroid medication for 1 week We will restart her dose of levothyroxine 137 mcg daily and check her TSH at her next appointment

## 2020-01-23 NOTE — Assessment & Plan Note (Signed)
Acute Has been experiencing cough, chest tightness, shortness of breath and wheezing-consistent with asthma exacerbation Has not had her inhalers On exam lungs sound clear.  Recent chest x-ray and CT of chest showed no pneumonia/PE Start Advair 100-50 mcg per dose twice daily Albuterol inhaler as needed Restart Singulair 10 mg nightly Continue Zyrtec 10 mg daily She will call if there is no improvement-hope to avoid steroids given her sugars are not controlled at this time

## 2020-01-23 NOTE — Assessment & Plan Note (Signed)
Chronic Currently on monthly infusions Following with oncology-has upcoming appointment

## 2020-01-23 NOTE — Assessment & Plan Note (Signed)
Chronic Not controlled-sugars have been over 200 since starting her cancer treatment Recent Metformin to 1000 mg twice daily Start Rybelsus 3 mg daily Will check A1c, urine microalbumin when she returns in 3 months

## 2020-01-27 ENCOUNTER — Telehealth: Payer: Self-pay | Admitting: Internal Medicine

## 2020-01-27 DIAGNOSIS — H25813 Combined forms of age-related cataract, bilateral: Secondary | ICD-10-CM | POA: Diagnosis not present

## 2020-01-27 DIAGNOSIS — E119 Type 2 diabetes mellitus without complications: Secondary | ICD-10-CM | POA: Diagnosis not present

## 2020-01-27 NOTE — Telephone Encounter (Signed)
I would base it on what her eye doctor told her - if they think they are ready to be removed and she needs to have them removed then yes.    Eventually they will have to be removed -

## 2020-01-27 NOTE — Telephone Encounter (Signed)
Patient called and said she went and had a diabetic eye exam and the exam showed that she has cataracts in both eyes. She was wondering if she should go ahead and have the surgery or wait. She can be reached at [phone]

## 2020-01-28 ENCOUNTER — Telehealth: Payer: Self-pay

## 2020-01-28 NOTE — Telephone Encounter (Signed)
Ms Bricco left vm wanting to know if she can have cataract surgery while on octreotide.

## 2020-01-28 NOTE — Telephone Encounter (Signed)
Spoke with patient today. 

## 2020-01-30 NOTE — Telephone Encounter (Signed)
Per Dr Burr Medico it is ok for Jessica Farrell to have cataract surgery while taking octreotide. I left a vm letting her know.

## 2020-02-04 NOTE — Progress Notes (Signed)
[facility]   [phone] [phone]   Clinic Follow up Note   Patient Care Team: Binnie Rail, MD as PCP - General (Internal Medicine) Truitt Merle, MD as Consulting Physician (Hematology) Jackolyn Confer, MD as Consulting Physician (General Surgery)  Date of Service:  02/05/2020  CHIEF COMPLAINT: Follow up carcinoid tumor  SUMMARY OF ONCOLOGIC HISTORY: Oncology History Overview Note  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   Staging form: Colon and Rectum, AJCC 7th Edition     Clinical: T3, N2, M0 - Unsigned     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 ([place])  01/21/2014 Imaging   CT: Distal small bowel obstruction due to 3.4 cm soft tissue mass near the ileocecal valve, with adjacent right lower quadrant mesenteric lymphadenopathy   02/03/2014 Pathologic Stage   invasive well diff neuroendocrine tumor (carcinoid), 3cm, pT3pN2 (5/25 nodes positive). negative margines.   02/03/2014 Surgery   Laparoscopic-assisted right colectomy and resection of distal ileum   02/03/2014 Initial Diagnosis   Metastatic malignant neuroendocrine tumor of the ileocecal valve to regional lymph nodes.    05/31/2015 Imaging   CT A/P w contrast  IMPRESSION: 1. No evidence of metastatic disease. 2. Question mild basilar subpleural pulmonary fibrosis.   09/11/2019 Imaging   CT AP W contrast    IMPRESSION: 1. Redemonstrated postoperative findings of ileocolectomy and reanastomosis. 2. There are prominent lymph nodes in the right mesocolon which are slightly increased in size compared to prior examination, the largest node measuring 1.8 x 0.7 cm, previously 1.5 x 0.4 cm. Findings are nonspecific although concerning for nodal metastatic disease. 3. There are additional new small nodules of the transverse mesocolon or omentum measuring up to 9 mm, likewise concerning for nodal or omental metastatic disease. 4. Redemonstrated  broad-based midline, fat containing ventral hernia components. 5. Fluid in the endometrial cavity, abnormal in the late postmenopausal setting although unchanged compared to prior examination. 6. Status post interval cholecystectomy. 7. Aortic Atherosclerosis (ICD10-I70.0).   10/06/2019 PET scan   IMPRESSION: 1. Several small nodules in the peritoneal space and retroperitoneal space with intense radiotracer activity consistent with metastatic well differentiated neuroendocrine tumor. 2. Several small liver lesions above background activity are indeterminate. Consider MRI liver with and without contrast. 3. Radiotracer activity within mediastinal lymph nodes and bilateral pulmonary nodules consistent with metastatic well-differentiated neuroendocrine tumor to the lungs. Activity is very low within these lesions but measurable. 4. No clear evidence skeletal metastasis. Single lesion in the posterior LEFT SI joint warrants attention on follow-up. 5. Although there are multiple sites of metastatic disease, the overall tumor burden of metastatic disease very small.   10/28/2019 Relapse/Recurrence   FINAL MICROSCOPIC DIAGNOSIS:   A. PERITONEAL NODULES, BIOPSY:  -  Well-differentiated neuroendocrine tumor (carcinoid)  -  See comment   COMMENT:   By immunohistochemistry, the neoplastic cells are positive for CD56,  chromogranin and synaptophysin with a low proliferative rate by Ki-67  (less than 1%).  These findings are consistent with metastasis of the  patient's previously diagnosed small bowel carcinoid.  These findings  were discussed with Dr. Morey Hummingbird on October 30, 2019.    11/06/2019 -  Chemotherapy   First-line Monthly Sandostatin injection starting 11/06/19       CURRENT THERAPY:  First-line Monthly Sandostatin injection starting 11/06/19  INTERVAL HISTORY:  Jessica Farrell is here for a follow up of carcinoid tumor. She presents to the clinic alone.  She had hard time  after last dose. She  pasted out in parking lot and hit left side of her face which caused bruises , but woke up on herself and went home, she did not go to ED until 3 days later when her son urged her to go to check out. She was again seen on 11/28 for pleuritic chest pain   She also reports severe fatigue since she started the injections, especially in last month Diarrhea has improved from 4-5 to 2-3 times a day, flushing also improved   All other systems were reviewed with the patient and are negative.  MEDICAL HISTORY:  Past Medical History:  Diagnosis Date  . Asthma   . Cellulitis March  2014   Left foot and ankle  . colon ca dx'd 01/2014  . Colon polyps    2015   . Diabetes mellitus ([place]) 05/05/2012   Type II  . Hyperlipidemia   . Morbid obesity ([place])   . Thyroid disease 1990's   HypoThyroidism    SURGICAL HISTORY: Past Surgical History:  Procedure Laterality Date  . CHOLECYSTECTOMY N/A 05/02/2017   Procedure: LAPAROSCOPIC CHOLECYSTECTOMY WITH INTRAOPERATIVE CHOLANGIOGRAM, VENTRAL HERNIA REPAIR;  Surgeon: Jovita Kussmaul, MD;  Location: [facility];  Service: General;  Laterality: N/A;  . COLON RESECTION N/A 02/03/2014   Procedure: LAPAROSCOPIC ASSISTED BOWEL RESECTION;  Surgeon: Jackolyn Confer, MD;  Location: [facility];  Service: General;  Laterality: N/A;  . FRACTURE SURGERY  2000   Ankle  . TOOTH EXTRACTION     wisdom Teeth    I have reviewed the social history and family history with the patient and they are unchanged from previous note.  ALLERGIES:  is allergic to penicillins.  MEDICATIONS:  Current Outpatient Medications  Medication Sig Dispense Refill  . albuterol (VENTOLIN HFA) 108 (90 Base) MCG/ACT inhaler Inhale 2 puffs into the lungs every 6 (six) hours as needed for wheezing or shortness of breath. 3 each 8  . atorvastatin (LIPITOR) 10 MG tablet Take 1 tablet (10 mg total) by mouth daily. 90 tablet 1  . blood glucose meter kit and supplies KIT Dispense based on  patient and insurance preference. Use up to 2 times daily as directed. DX code: E11.9 1 each 0  . cetirizine (ZYRTEC) 10 MG tablet Take 1 tablet (10 mg total) by mouth daily. 15 tablet 0  . Fluticasone-Salmeterol (ADVAIR) 100-50 MCG/DOSE AEPB Inhale 1 puff into the lungs 2 (two) times daily. 180 each 3  . gabapentin (NEURONTIN) 100 MG capsule Take 1 capsule (100 mg total) by mouth at bedtime. 90 capsule 1  . glucose blood test strip Based on insurance preference. Use as instructed once daily to check sugars. Dx E11.9 100 each 12  . Lancets (ONETOUCH ULTRASOFT) lancets Use as instructed once daily to check sugars. Dx E11.9 100 each 12  . levothyroxine (SYNTHROID) 137 MCG tablet TAKE 1 TABLET DAILY BEFORE BREAKFAST. 90 tablet 1  . loperamide (IMODIUM) 1 MG/5ML solution Take by mouth as needed for diarrhea or loose stools.    . metFORMIN (GLUCOPHAGE) 500 MG tablet Take 2 tablets (1,000 mg total) by mouth 2 (two) times daily with a meal. 360 tablet 1  . montelukast (SINGULAIR) 10 MG tablet Take 1 tablet (10 mg total) by mouth at bedtime. 90 tablet 1  . Multiple Vitamins-Minerals (CENTRUM SILVER PO) Take 1 tablet by mouth daily.    . naproxen (NAPROSYN) 375 MG tablet Take 1 tablet (375 mg total) by mouth 2 (two) times daily. 20 tablet 0  . Phenylephrine HCl (SINEX  REGULAR NA) Place 1 spray into the nose daily as needed (ALLERGIES).    . promethazine (PHENERGAN) 12.5 MG tablet Take 1 tablet (12.5 mg total) by mouth every 6 (six) hours as needed for nausea or vomiting. 30 tablet 0  . Semaglutide (RYBELSUS) 3 MG TABS Take 3 mg by mouth daily before breakfast. 30 tablet 0   No current facility-administered medications for this visit.    PHYSICAL EXAMINATION: ECOG PERFORMANCE STATUS: 2 - Symptomatic, <50% confined to bed  Vitals:   02/05/20 1101  BP: 134/79  Pulse: 99  Resp: 19  Temp: 98.9 F (37.2 C)  SpO2: 96%   Filed Weights   02/05/20 1101  Weight: 184 lb 9.6 oz (83.7 kg)     GENERAL:alert, no distress and comfortable SKIN: skin color, texture, turgor are normal, no rashes or significant lesions EYES: normal, Conjunctiva are pink and non-injected, sclera clear NECK: supple, thyroid normal size, non-tender, without nodularity LYMPH:  no palpable lymphadenopathy in the cervical, axillary  LUNGS: clear to auscultation and percussion with normal breathing effort HEART: regular rate & rhythm and no murmurs and no lower extremity edema ABDOMEN:abdomen soft, non-tender and normal bowel sounds Musculoskeletal:no cyanosis of digits and no clubbing  NEURO: alert & oriented x 3 with fluent speech, no focal motor/sensory deficits  LABORATORY DATA:  I have reviewed the data as listed CBC Latest Ref Rng & Units 01/18/2020 01/08/2020 01/05/2020  WBC 4.0 - 10.5 K/uL 8.5 6.6 6.7  Hemoglobin 12.0 - 15.0 g/dL 12.9 12.4 12.0  Hematocrit 36.0 - 46.0 % 38.6 37.9 36.2  Platelets 150 - 400 K/uL 225 200 184     CMP Latest Ref Rng & Units 01/18/2020 01/08/2020 01/05/2020  Glucose 70 - 99 mg/dL 220(H) 282(H) 285(H)  BUN 8 - 23 mg/dL $Remove'17 15 17  'zvTcLjn$ Creatinine 0.44 - 1.00 mg/dL 1.03(H) 1.01(H) 1.06(H)  Sodium 135 - 145 mmol/L 137 136 137  Potassium 3.5 - 5.1 mmol/L 3.9 4.3 4.4  Chloride 98 - 111 mmol/L 97(L) 99 101  CO2 22 - 32 mmol/L $RemoveB'29 30 26  'ruVgEsks$ Calcium 8.9 - 10.3 mg/dL 8.9 9.3 8.6(L)  Total Protein 6.5 - 8.1 g/dL - - 6.9  Total Bilirubin 0.3 - 1.2 mg/dL - - 0.5  Alkaline Phos 38 - 126 U/L - - 139(H)  AST 15 - 41 U/L - - 54(H)  ALT 0 - 44 U/L - - 40      RADIOGRAPHIC STUDIES: I have personally reviewed the radiological images as listed and agreed with the findings in the report. No results found.   ASSESSMENT & PLAN:  Jessica Farrell is a 74 y.o. female with    1. Well differentiated low-grade neuroendocrine tumor of terminal ileum (carcinoid tumor), pT3 N1 M0, stage IIIB, peritoneal metastasis 09/2019, probable lung and mediastinal nodules metastasis, indeterminate  liver lesions, overall low tumor burden  -She was initially diagnosed in 01/2014. Her tumor has been completely resected. Surgical margins were negative. -10/06/19 Dotatate PET showedmetastatic disease to peritoneum, abdominal and mediastinal lymph nodes, and the lungs. Indeterminate liver lesions. 10/29/19 peritoneal biopsy confirmed well-differentiated neuroendocrine tumor.  -With diffuse metastasis, her disease is not curable at this stage, but still treatable, especially with low tumor burden. I started her on First-line Sandostatin injections monthly on 11/06/19 -She unfortunately did not tolerate Sandostatin injection well, has developed hypoglycemia, fatigue, and the episode of syncope and fall right after the injection.  The diarrhea and flushing has, but overall symptoms are mild. -Given her poor tolerance Sandostatin  injection, and her overall low tumor burden, limited clinical symptoms, I recommend to hold treatment for now, and repeat restaging scan in 3 months. -We discussed the option of restarting Sandostatin injection at a low dose 20 mg every 4 weeks, or other treatment options such as Lutathera, will determine on next visit.  We again discussed the indolent nature of his tumor, and the option of clinical observation without treatment which is also reasonable for her.  -f/u in 3 weeks    2. HTN, DM, hypothyroidism, mild neuropathy  -She will continue medication and follow-up with her primary care physician. -She has mild tingling of LE from DM. She rarely uses Gabapentin as needed.  -She has developed worsening hyperglycemia as she started Sandostatin, this will likely improve after stopping the injection  3.Cholecystitis status post colectomy in March 2019    Plan: -I recommend stopping Sandostatin injection due to her poor tolerance -Follow-up in 3 months with lab and CT CAP w contrast a few days before   No problem-specific Assessment & Plan notes found for this  encounter.   Orders Placed This Encounter  Procedures  . CT CHEST ABDOMEN PELVIS W CONTRAST    Standing Status:   Future    Standing Expiration Date:   02/04/2021    Order Specific Question:   If indicated for the ordered procedure, I authorize the administration of contrast media per Radiology protocol    Answer:   Yes    Order Specific Question:   Preferred imaging location?    Answer:   [facility]    Order Specific Question:   Release to patient    Answer:   Immediate    Order Specific Question:   Is Oral Contrast requested for this exam?    Answer:   Yes, Per Radiology protocol    Order Specific Question:   Reason for Exam (SYMPTOM  OR DIAGNOSIS REQUIRED)    Answer:   restaging, pt has not been on treatment since 01/03/2020   All questions were answered. The patient knows to call the clinic with any problems, questions or concerns. No barriers to learning was detected. The total time spent in the appointment was 30 minutes.     Truitt Merle, MD 02/05/2020   I, Joslyn Devon, am acting as scribe for Truitt Merle, MD.   I have reviewed the above documentation for accuracy and completeness, and I agree with the above.

## 2020-02-05 ENCOUNTER — Encounter: Payer: Self-pay | Admitting: Hematology

## 2020-02-05 ENCOUNTER — Other Ambulatory Visit: Payer: Self-pay

## 2020-02-05 ENCOUNTER — Inpatient Hospital Stay: Payer: Medicare HMO | Attending: Hematology | Admitting: Hematology

## 2020-02-05 ENCOUNTER — Inpatient Hospital Stay: Payer: Medicare HMO

## 2020-02-05 VITALS — BP 134/79 | HR 99 | Temp 98.9°F | Resp 19 | Ht 65.0 in | Wt 184.6 lb

## 2020-02-05 DIAGNOSIS — I10 Essential (primary) hypertension: Secondary | ICD-10-CM | POA: Insufficient documentation

## 2020-02-05 DIAGNOSIS — C7A012 Malignant carcinoid tumor of the ileum: Secondary | ICD-10-CM | POA: Insufficient documentation

## 2020-02-05 DIAGNOSIS — C7B01 Secondary carcinoid tumors of distant lymph nodes: Secondary | ICD-10-CM | POA: Diagnosis not present

## 2020-02-05 DIAGNOSIS — C7B04 Secondary carcinoid tumors of peritoneum: Secondary | ICD-10-CM | POA: Insufficient documentation

## 2020-02-05 DIAGNOSIS — Z79899 Other long term (current) drug therapy: Secondary | ICD-10-CM | POA: Diagnosis not present

## 2020-02-05 DIAGNOSIS — C7B8 Other secondary neuroendocrine tumors: Secondary | ICD-10-CM

## 2020-02-05 DIAGNOSIS — Z7984 Long term (current) use of oral hypoglycemic drugs: Secondary | ICD-10-CM | POA: Insufficient documentation

## 2020-02-05 DIAGNOSIS — E039 Hypothyroidism, unspecified: Secondary | ICD-10-CM | POA: Diagnosis not present

## 2020-02-17 NOTE — Telephone Encounter (Signed)
I have received forms via fax from [facility] for patient being out of work 11/15 to 12/03.  Forms have been completed and placed in providers box to review and sign.  Provider is out of office until 02/23/20.

## 2020-02-19 DIAGNOSIS — Z0279 Encounter for issue of other medical certificate: Secondary | ICD-10-CM

## 2020-02-23 ENCOUNTER — Other Ambulatory Visit: Payer: Self-pay | Admitting: Internal Medicine

## 2020-02-23 NOTE — Telephone Encounter (Signed)
Forms have been signed, Faxed to [facility], Copy sent to scan &Charged for.   LVM to information patient. Original mailed to patient for her records.

## 2020-03-04 DIAGNOSIS — H25811 Combined forms of age-related cataract, right eye: Secondary | ICD-10-CM | POA: Diagnosis not present

## 2020-03-04 DIAGNOSIS — H25812 Combined forms of age-related cataract, left eye: Secondary | ICD-10-CM | POA: Diagnosis not present

## 2020-03-15 DIAGNOSIS — H25812 Combined forms of age-related cataract, left eye: Secondary | ICD-10-CM | POA: Diagnosis not present

## 2020-03-15 DIAGNOSIS — H2512 Age-related nuclear cataract, left eye: Secondary | ICD-10-CM | POA: Diagnosis not present

## 2020-04-09 DIAGNOSIS — H25811 Combined forms of age-related cataract, right eye: Secondary | ICD-10-CM | POA: Diagnosis not present

## 2020-04-09 DIAGNOSIS — H2511 Age-related nuclear cataract, right eye: Secondary | ICD-10-CM | POA: Diagnosis not present

## 2020-04-22 ENCOUNTER — Ambulatory Visit (INDEPENDENT_AMBULATORY_CARE_PROVIDER_SITE_OTHER): Payer: Medicare HMO | Admitting: Internal Medicine

## 2020-04-22 ENCOUNTER — Other Ambulatory Visit: Payer: Self-pay

## 2020-04-22 ENCOUNTER — Encounter: Payer: Self-pay | Admitting: Internal Medicine

## 2020-04-22 VITALS — BP 114/80 | HR 96 | Temp 98.3°F | Ht 65.0 in | Wt 176.0 lb

## 2020-04-22 DIAGNOSIS — I700 Atherosclerosis of aorta: Secondary | ICD-10-CM

## 2020-04-22 DIAGNOSIS — R202 Paresthesia of skin: Secondary | ICD-10-CM

## 2020-04-22 DIAGNOSIS — E038 Other specified hypothyroidism: Secondary | ICD-10-CM | POA: Diagnosis not present

## 2020-04-22 DIAGNOSIS — E1165 Type 2 diabetes mellitus with hyperglycemia: Secondary | ICD-10-CM | POA: Diagnosis not present

## 2020-04-22 DIAGNOSIS — E7849 Other hyperlipidemia: Secondary | ICD-10-CM | POA: Diagnosis not present

## 2020-04-22 DIAGNOSIS — J4541 Moderate persistent asthma with (acute) exacerbation: Secondary | ICD-10-CM

## 2020-04-22 LAB — CBC WITH DIFFERENTIAL/PLATELET
Basophils Absolute: 0 10*3/uL (ref 0.0–0.1)
Basophils Relative: 0.5 % (ref 0.0–3.0)
Eosinophils Absolute: 0.2 10*3/uL (ref 0.0–0.7)
Eosinophils Relative: 3 % (ref 0.0–5.0)
HCT: 36.3 % (ref 36.0–46.0)
Hemoglobin: 12 g/dL (ref 12.0–15.0)
Lymphocytes Relative: 35.4 % (ref 12.0–46.0)
Lymphs Abs: 2.6 10*3/uL (ref 0.7–4.0)
MCHC: 33 g/dL (ref 30.0–36.0)
MCV: 90.4 fl (ref 78.0–100.0)
Monocytes Absolute: 0.8 10*3/uL (ref 0.1–1.0)
Monocytes Relative: 11.1 % (ref 3.0–12.0)
Neutro Abs: 3.7 10*3/uL (ref 1.4–7.7)
Neutrophils Relative %: 50 % (ref 43.0–77.0)
Platelets: 221 10*3/uL (ref 150.0–400.0)
RBC: 4.01 Mil/uL (ref 3.87–5.11)
RDW: 12.7 % (ref 11.5–15.5)
WBC: 7.4 10*3/uL (ref 4.0–10.5)

## 2020-04-22 LAB — HEMOGLOBIN A1C: Hgb A1c MFr Bld: 7.8 % — ABNORMAL HIGH (ref 4.6–6.5)

## 2020-04-22 LAB — LIPID PANEL
Cholesterol: 120 mg/dL (ref 0–200)
HDL: 29.4 mg/dL — ABNORMAL LOW (ref >39.00)
NonHDL: 90.81
Total CHOL/HDL Ratio: 4
Triglycerides: 211 mg/dL — ABNORMAL HIGH (ref 0.0–149.0)
VLDL: 42.2 mg/dL — ABNORMAL HIGH (ref 0.0–40.0)

## 2020-04-22 LAB — COMPREHENSIVE METABOLIC PANEL
ALT: 23 U/L (ref 0–35)
AST: 20 U/L (ref 0–37)
Albumin: 3.8 g/dL (ref 3.5–5.2)
Alkaline Phosphatase: 193 U/L — ABNORMAL HIGH (ref 39–117)
BUN: 16 mg/dL (ref 6–23)
CO2: 28 mEq/L (ref 19–32)
Calcium: 9.2 mg/dL (ref 8.4–10.5)
Chloride: 101 mEq/L (ref 96–112)
Creatinine, Ser: 0.95 mg/dL (ref 0.40–1.20)
GFR: 58.84 mL/min — ABNORMAL LOW (ref >60.00)
Glucose, Bld: 173 mg/dL — ABNORMAL HIGH (ref 70–99)
Potassium: 4.1 mEq/L (ref 3.5–5.1)
Sodium: 137 mEq/L (ref 135–145)
Total Bilirubin: 0.5 mg/dL (ref 0.2–1.2)
Total Protein: 7.2 g/dL (ref 6.0–8.3)

## 2020-04-22 LAB — TSH: TSH: 59.01 u[IU]/mL — ABNORMAL HIGH (ref 0.35–4.50)

## 2020-04-22 LAB — VITAMIN B12: Vitamin B-12: 345 pg/mL (ref 211–911)

## 2020-04-22 LAB — MICROALBUMIN / CREATININE URINE RATIO
Creatinine,U: 101.6 mg/dL
Microalb Creat Ratio: 1.3 mg/g (ref 0.0–30.0)
Microalb, Ur: 1.3 mg/dL (ref 0.0–1.9)

## 2020-04-22 LAB — LDL CHOLESTEROL, DIRECT: Direct LDL: 64 mg/dL

## 2020-04-22 MED ORDER — METFORMIN HCL ER 750 MG PO TB24: 1500.0000 mg | ORAL_TABLET | Freq: Every day | ORAL | 1 refills | Status: DC

## 2020-04-22 MED ORDER — DAPAGLIFLOZIN PROPANEDIOL 5 MG PO TABS: 5.0000 mg | ORAL_TABLET | Freq: Every day | ORAL | 1 refills | Status: DC

## 2020-04-22 NOTE — Assessment & Plan Note (Addendum)
Chronic Numbness/ tingling Takes gabapentin 100 mg at HS prn - ? Helps or not  B12 level Stressed good sugar control

## 2020-04-22 NOTE — Assessment & Plan Note (Signed)
Chronic Check lipid panel  Continue atorvastatin 10 mg daily Regular exercise and healthy diet encouraged  

## 2020-04-22 NOTE — Progress Notes (Signed)
Subjective:    Patient ID: Jessica Farrell, female    DOB: [birth year], 75 y.o.   MRN: [id number]  HPI The patient is here for follow up of their chronic medical problems, including DM, hyperlipidemia, hypothyroidism, asthma, tingling in legs  She is somewhat compliant with a diabetic diet.  She occ checks sugar - 140 fasting  5 days ago started coughing - she thought it was allergies and asthma.  It lasted a few days - it is better now.  She had some wheezing.  She denies fever.      Medications and allergies reviewed with patient and updated if appropriate.  Patient Active Problem List   Diagnosis Date Noted  . Aortic atherosclerosis ([place]) 04/22/2020  . Asthma exacerbation 01/23/2020  . Tingling in extremities 05/23/2018  . Osteopenia 03/27/2018  . Umbilical hernia without obstruction or gangrene 11/21/2017  . Onychomycosis 05/22/2017  . Athlete's foot 05/22/2017  . Carcinoid tumor of cecum 01/26/2017  . 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 ([place]) 02/18/2014  . Chronic venous insufficiency 08/02/2012  . HLD (hyperlipidemia) 05/15/2012  . Diabetes mellitus ([place]) 05/05/2012  . Hypothyroidism 05/05/2012  . Asthma 05/05/2012    Current Outpatient Medications on File Prior to Visit  Medication Sig Dispense Refill  . albuterol (VENTOLIN HFA) 108 (90 Base) MCG/ACT inhaler Inhale 2 puffs into the lungs every 6 (six) hours as needed for wheezing or shortness of breath. 3 each 8  . atorvastatin (LIPITOR) 10 MG tablet Take 1 tablet (10 mg total) by mouth daily. 90 tablet 1  . cetirizine (ZYRTEC) 10 MG tablet Take 1 tablet (10 mg total) by mouth daily. 15 tablet 0  . Fluticasone-Salmeterol (ADVAIR) 100-50 MCG/DOSE AEPB Inhale 1 puff into the lungs 2 (two) times daily. 180 each 3  . gabapentin (NEURONTIN) 100 MG capsule Take 1 capsule (100 mg total) by mouth at bedtime. 90 capsule 1  . glucose blood test strip Based on insurance preference. Use as instructed once  daily to check sugars. Dx E11.9 100 each 12  . Lancets (ONETOUCH ULTRASOFT) lancets Use as instructed once daily to check sugars. Dx E11.9 100 each 12  . levothyroxine (SYNTHROID) 137 MCG tablet TAKE 1 TABLET DAILY BEFORE BREAKFAST. 90 tablet 1  . loperamide (IMODIUM) 1 MG/5ML solution Take by mouth as needed for diarrhea or loose stools.    . metFORMIN (GLUCOPHAGE) 500 MG tablet Take 2 tablets (1,000 mg total) by mouth 2 (two) times daily with a meal. 360 tablet 1  . montelukast (SINGULAIR) 10 MG tablet Take 1 tablet (10 mg total) by mouth at bedtime. 90 tablet 1  . Multiple Vitamins-Minerals (CENTRUM SILVER PO) Take 1 tablet by mouth daily.    . naproxen (NAPROSYN) 375 MG tablet Take 1 tablet (375 mg total) by mouth 2 (two) times daily. 20 tablet 0  . Phenylephrine HCl (SINEX REGULAR NA) Place 1 spray into the nose daily as needed (ALLERGIES).    . promethazine (PHENERGAN) 12.5 MG tablet Take 1 tablet (12.5 mg total) by mouth every 6 (six) hours as needed for nausea or vomiting. 30 tablet 0  . RYBELSUS 3 MG TABS TAKE 1 TABLET BY MOUTH DAILY BEFORE BREAKFAST. 30 tablet 0   No current facility-administered medications on file prior to visit.    Past Medical History:  Diagnosis Date  . Asthma   . Cellulitis March  2014   Left foot and ankle  . colon ca dx'd 01/2014  . Colon polyps  2015   . Diabetes mellitus ([place]) 05/05/2012   Type II  . Hyperlipidemia   . Morbid obesity ([place])   . Thyroid disease 1990's   HypoThyroidism    Past Surgical History:  Procedure Laterality Date  . CHOLECYSTECTOMY N/A 05/02/2017   Procedure: LAPAROSCOPIC CHOLECYSTECTOMY WITH INTRAOPERATIVE CHOLANGIOGRAM, VENTRAL HERNIA REPAIR;  Surgeon: Jovita Kussmaul, MD;  Location: [facility];  Service: General;  Laterality: N/A;  . COLON RESECTION N/A 02/03/2014   Procedure: LAPAROSCOPIC ASSISTED BOWEL RESECTION;  Surgeon: Jackolyn Confer, MD;  Location: [facility];  Service: General;  Laterality: N/A;  . FRACTURE SURGERY   2000   Ankle  . TOOTH EXTRACTION     wisdom Teeth    Social History   Socioeconomic History  . Marital status: Divorced    Spouse name: Not on file  . Number of children: Not on file  . Years of education: Not on file  . Highest education level: Not on file  Occupational History  . Not on file  Tobacco Use  . Smoking status: Never Smoker  . Smokeless tobacco: Never Used  Vaping Use  . Vaping Use: Never used  Substance and Sexual Activity  . Alcohol use: No  . Drug use: No  . Sexual activity: Not Currently    Birth control/protection: Post-menopausal  Other Topics Concern  . Not on file  Social History Narrative  . Not on file   Social Determinants of Health   Financial Resource Strain: Not on file  Food Insecurity: Not on file  Transportation Needs: Not on file  Physical Activity: Not on file  Stress: Not on file  Social Connections: Not on file    Family History  Problem Relation Age of Onset  . Alzheimer's disease Father   . Heart disease Mother   . Arthritis Mother   . Hypertension Mother   . Alzheimer's disease Sister   . Cancer Sister 25       breast cancer   . Heart disease Brother        Heart Disease before age 63  . Breast cancer Sister   . Colon cancer Neg Hx   . Esophageal cancer Neg Hx   . Rectal cancer Neg Hx   . Stomach cancer Neg Hx     Review of Systems  Constitutional: Negative for chills and fever.  Respiratory: Positive for cough and wheezing. Negative for shortness of breath.   Cardiovascular: Positive for leg swelling (mild). Negative for chest pain and palpitations.  Neurological: Positive for headaches (occ). Negative for light-headedness.       Objective:   Vitals:   04/22/20 0931  BP: 114/80  Pulse: 96  Temp: 98.3 F (36.8 C)  SpO2: 95%   BP Readings from Last 3 Encounters:  04/22/20 114/80  02/05/20 134/79  01/23/20 118/72   Wt Readings from Last 3 Encounters:  04/22/20 176 lb (79.8 kg)  02/05/20 184 lb 9.6  oz (83.7 kg)  01/23/20 186 lb (84.4 kg)   Body mass index is 29.29 kg/m.   Physical Exam    Constitutional: Appears well-developed and well-nourished. No distress.  HENT:  Head: Normocephalic and atraumatic.  Neck: Neck supple. No tracheal deviation present. No thyromegaly present.  No cervical lymphadenopathy Cardiovascular: Normal rate, regular rhythm and normal heart sounds.   No murmur heard. No carotid bruit .  Trace b/l LE edema Pulmonary/Chest: Effort normal and breath sounds normal. No respiratory distress. No has no wheezes. No rales.  Skin: Skin is  warm and dry. Not diaphoretic.  Psychiatric: Normal mood and affect. Behavior is normal.      Assessment & Plan:    See Problem List for Assessment and Plan of chronic medical problems.    This visit occurred during the SARS-CoV-2 public health emergency.  Safety protocols were in place, including screening questions prior to the visit, additional usage of staff PPE, and extensive cleaning of exam room while observing appropriate contact time as indicated for disinfecting solutions.

## 2020-04-22 NOTE — Assessment & Plan Note (Signed)
Acute Mild exacerbation these past few days Symptoms improved, lungs clear No additional treatment needed Albuterol prn, advair bid

## 2020-04-22 NOTE — Assessment & Plan Note (Signed)
Chronic  Clinically euthyroid Currently taking levothyroxine 137 mcg Check tsh  Titrate med dose if needed

## 2020-04-22 NOTE — Assessment & Plan Note (Addendum)
Chronic Taking atorvastatin 10 mg daily - continue Check lipids Advised improving diet

## 2020-04-22 NOTE — Patient Instructions (Addendum)
  Blood work was ordered.     Medications changes include :   Change metformin to 1500 mg once daily with dinner.  Start farxiga 5 mg daily.  Your prescription(s) have been submitted to your pharmacy. Please take as directed and contact our office if you believe you are having problem(s) with the medication(s).     Please followup in 6 months

## 2020-04-22 NOTE — Assessment & Plan Note (Signed)
Chronic Last a1c was a while ago Sugars per patient not ideally controlled - last time she checked it was 140 fasting Not as compliant to a diabetic diet as she should be - work on diet Metformin is causing diarrhea - change to XR  - metformin 750 mg XR - 2 tabs with dinner Would benefit from [place] for renal protection - start 5 mg daily - may also be able to decrease metformin more in future a1c today, urine micro

## 2020-04-26 ENCOUNTER — Telehealth: Payer: Self-pay | Admitting: Internal Medicine

## 2020-04-26 NOTE — Telephone Encounter (Signed)
Spoke with patient today. 

## 2020-04-26 NOTE — Telephone Encounter (Signed)
Patient returned call in regards to recent lab results. She can be reached at [phone].

## 2020-05-03 ENCOUNTER — Other Ambulatory Visit: Payer: Self-pay

## 2020-05-03 ENCOUNTER — Ambulatory Visit (HOSPITAL_COMMUNITY)
Admission: RE | Admit: 2020-05-03 | Discharge: 2020-05-03 | Disposition: A | Discharge status: Home / Self Care | Payer: Medicare HMO | Source: Ambulatory Visit | Attending: Hematology | Admitting: Hematology

## 2020-05-03 ENCOUNTER — Inpatient Hospital Stay: Payer: Medicare HMO | Attending: Hematology

## 2020-05-03 ENCOUNTER — Encounter (HOSPITAL_COMMUNITY): Payer: Self-pay

## 2020-05-03 DIAGNOSIS — C7A012 Malignant carcinoid tumor of the ileum: Secondary | ICD-10-CM | POA: Diagnosis not present

## 2020-05-03 DIAGNOSIS — I700 Atherosclerosis of aorta: Secondary | ICD-10-CM | POA: Diagnosis not present

## 2020-05-03 DIAGNOSIS — C7B01 Secondary carcinoid tumors of distant lymph nodes: Secondary | ICD-10-CM | POA: Diagnosis not present

## 2020-05-03 DIAGNOSIS — Z86012 Personal history of benign carcinoid tumor: Secondary | ICD-10-CM | POA: Diagnosis not present

## 2020-05-03 DIAGNOSIS — R918 Other nonspecific abnormal finding of lung field: Secondary | ICD-10-CM | POA: Insufficient documentation

## 2020-05-03 DIAGNOSIS — Z79899 Other long term (current) drug therapy: Secondary | ICD-10-CM | POA: Diagnosis not present

## 2020-05-03 DIAGNOSIS — C7A00 Malignant carcinoid tumor of unspecified site: Secondary | ICD-10-CM | POA: Diagnosis not present

## 2020-05-03 DIAGNOSIS — C7B8 Other secondary neuroendocrine tumors: Secondary | ICD-10-CM

## 2020-05-03 DIAGNOSIS — C7B04 Secondary carcinoid tumors of peritoneum: Secondary | ICD-10-CM | POA: Diagnosis not present

## 2020-05-03 DIAGNOSIS — K5730 Diverticulosis of large intestine without perforation or abscess without bleeding: Secondary | ICD-10-CM | POA: Diagnosis not present

## 2020-05-03 DIAGNOSIS — I2510 Atherosclerotic heart disease of native coronary artery without angina pectoris: Secondary | ICD-10-CM | POA: Diagnosis not present

## 2020-05-03 DIAGNOSIS — K429 Umbilical hernia without obstruction or gangrene: Secondary | ICD-10-CM | POA: Diagnosis not present

## 2020-05-03 LAB — CBC WITH DIFFERENTIAL/PLATELET
Abs Immature Granulocytes: 0.03 10*3/uL (ref 0.00–0.07)
Basophils Absolute: 0.1 10*3/uL (ref 0.0–0.1)
Basophils Relative: 1 %
Eosinophils Absolute: 0.2 10*3/uL (ref 0.0–0.5)
Eosinophils Relative: 3 %
HCT: 35.6 % — ABNORMAL LOW (ref 36.0–46.0)
Hemoglobin: 11.5 g/dL — ABNORMAL LOW (ref 12.0–15.0)
Immature Granulocytes: 0 %
Lymphocytes Relative: 25 %
Lymphs Abs: 1.9 10*3/uL (ref 0.7–4.0)
MCH: 29.9 pg (ref 26.0–34.0)
MCHC: 32.3 g/dL (ref 30.0–36.0)
MCV: 92.5 fL (ref 80.0–100.0)
Monocytes Absolute: 0.6 10*3/uL (ref 0.1–1.0)
Monocytes Relative: 7 %
Neutro Abs: 4.8 10*3/uL (ref 1.7–7.7)
Neutrophils Relative %: 64 %
Platelets: 219 10*3/uL (ref 150–400)
RBC: 3.85 MIL/uL — ABNORMAL LOW (ref 3.87–5.11)
RDW: 12.6 % (ref 11.5–15.5)
WBC: 7.5 10*3/uL (ref 4.0–10.5)
nRBC: 0 % (ref 0.0–0.2)

## 2020-05-03 LAB — COMPREHENSIVE METABOLIC PANEL
ALT: 18 U/L (ref 0–44)
AST: 15 U/L (ref 15–41)
Albumin: 3.5 g/dL (ref 3.5–5.0)
Alkaline Phosphatase: 150 U/L — ABNORMAL HIGH (ref 38–126)
Anion gap: 5 (ref 5–15)
BUN: 14 mg/dL (ref 8–23)
CO2: 26 mmol/L (ref 22–32)
Calcium: 9.2 mg/dL (ref 8.9–10.3)
Chloride: 105 mmol/L (ref 98–111)
Creatinine, Ser: 0.92 mg/dL (ref 0.44–1.00)
GFR, Estimated: >60 mL/min (ref >60)
Glucose, Bld: 134 mg/dL — ABNORMAL HIGH (ref 70–99)
Potassium: 4.5 mmol/L (ref 3.5–5.1)
Sodium: 136 mmol/L (ref 135–145)
Total Bilirubin: 0.3 mg/dL (ref 0.3–1.2)
Total Protein: 7.1 g/dL (ref 6.5–8.1)

## 2020-05-03 IMAGING — CT CT CHEST-ABD-PELV W/ CM
3 of 5 series · 14 of 36 positions shown, 16 images · IV contrast (APPLIED)
Comparison: PET-CT [DATE].  Abdomen/pelvis CT [DATE].

CLINICAL DATA: History of carcinoid tumor, status post
ileocolectomy and reanastomosis. Regional metastatic lymph nodes
identified at pathology.

EXAM:
CT CHEST, ABDOMEN, AND PELVIS WITH CONTRAST
TECHNIQUE: Multidetector CT imaging of the chest, abdomen and pelvis was
performed following the standard protocol during bolus
administration of intravenous contrast.
CONTRAST:  100mL OMNIPAQUE IOHEXOL 300 MG/ML  SOLN

[Series 2: cap with · axial · 0.91mm/px · z∈[-599,-89]mm · 9 of 128 slices shown, 11 images]
[im 13/128  mediastinal]
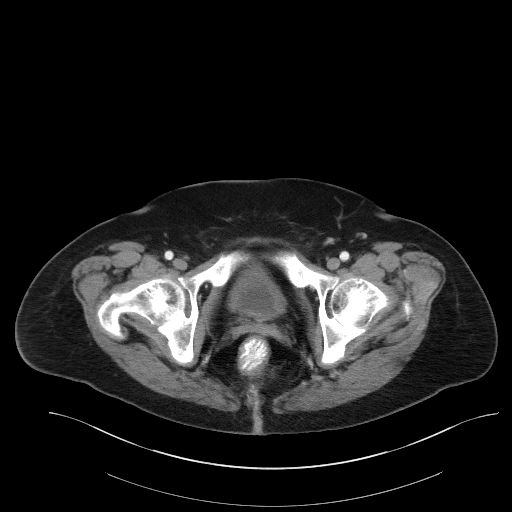
[im 13/128  bone]
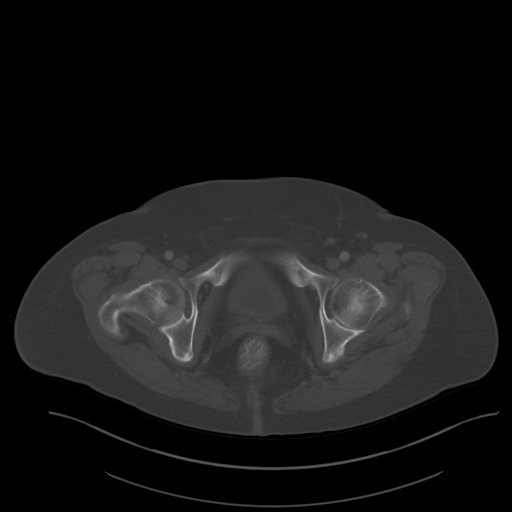
[im 26/128  mediastinal]
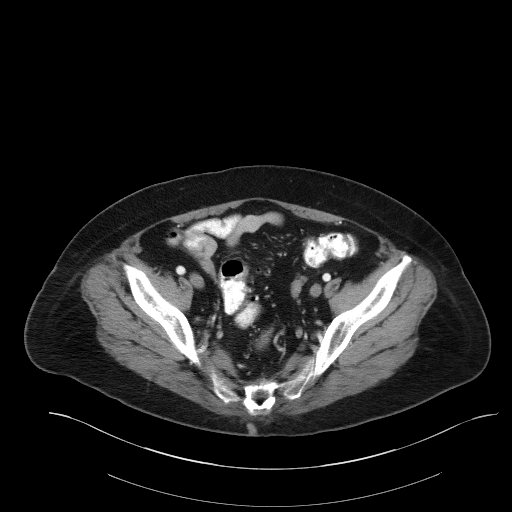
[im 39/128  mediastinal]
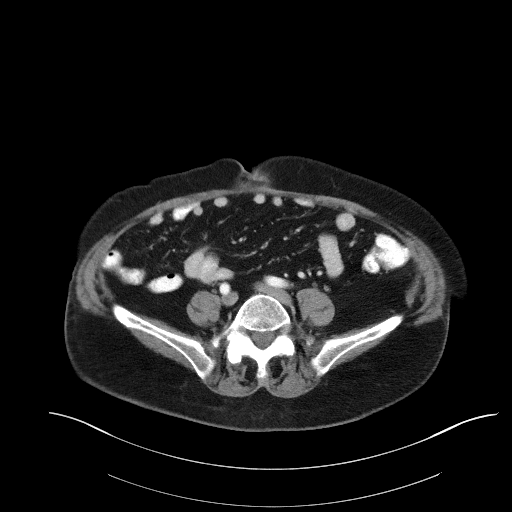
[im 51/128  mediastinal]
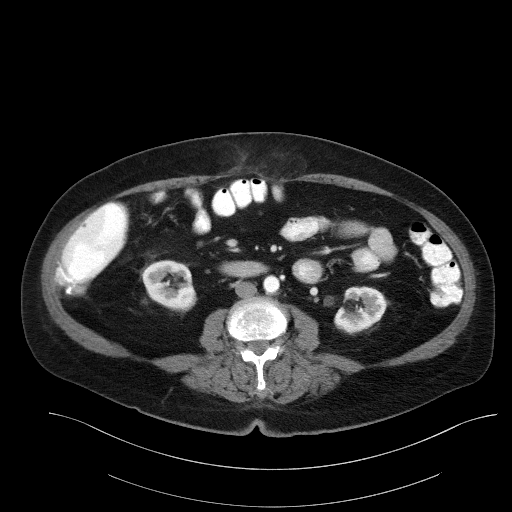
[im 64/128  mediastinal]
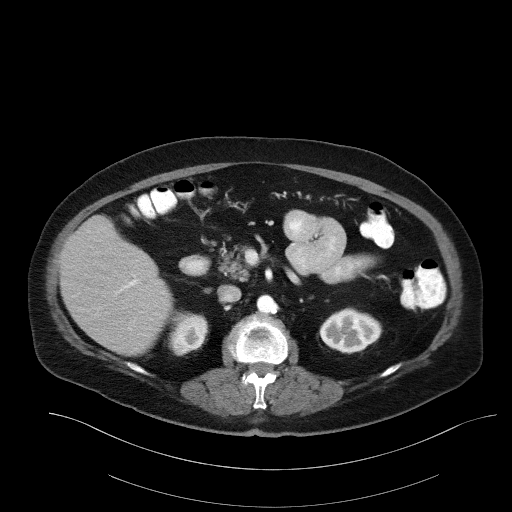
[im 77/128  mediastinal]
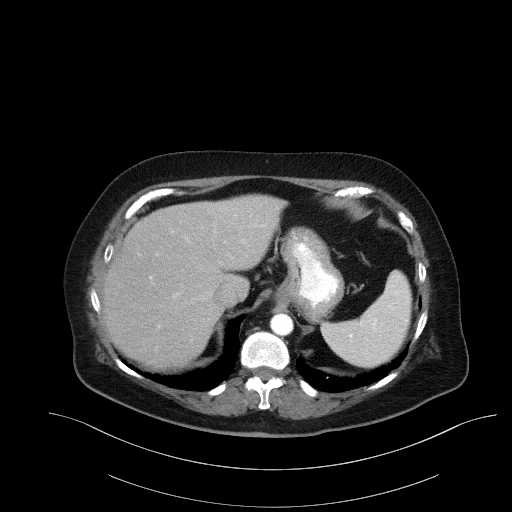
[im 89/128  mediastinal]
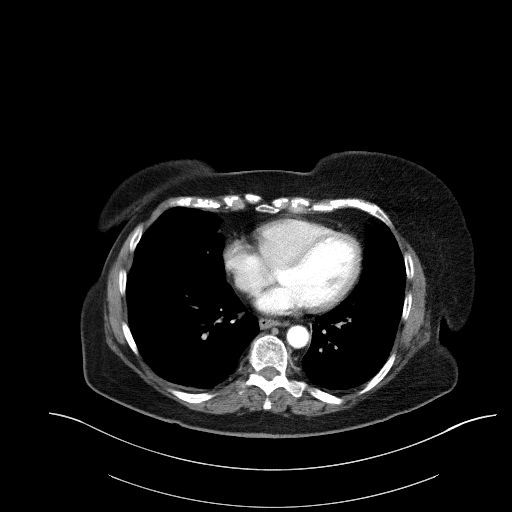
[im 102/128  mediastinal]
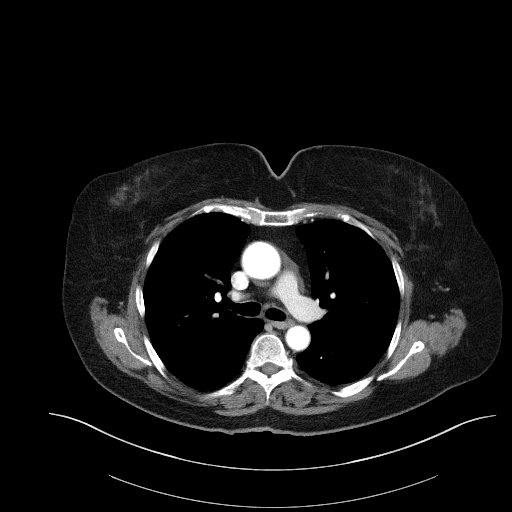
[im 115/128  mediastinal]
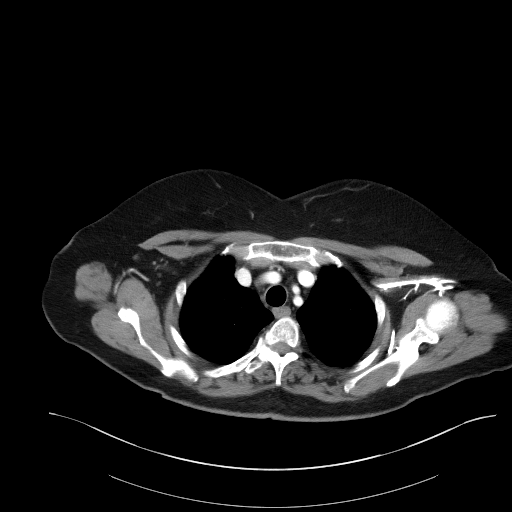
[im 115/128  bone]
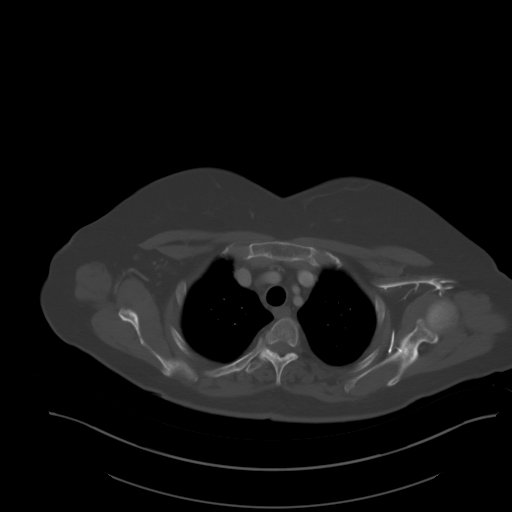

[Series 4: coronals · coronal · 0.99mm/px · 3 of 144 slices shown]
[im 29/144  mediastinal]
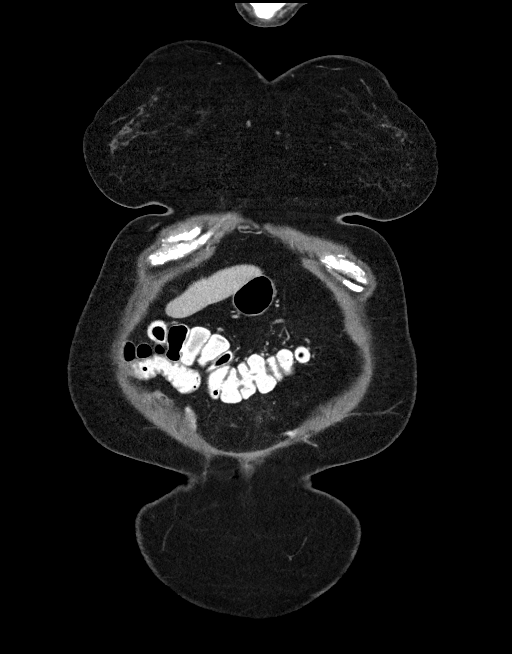
[im 58/144  mediastinal]
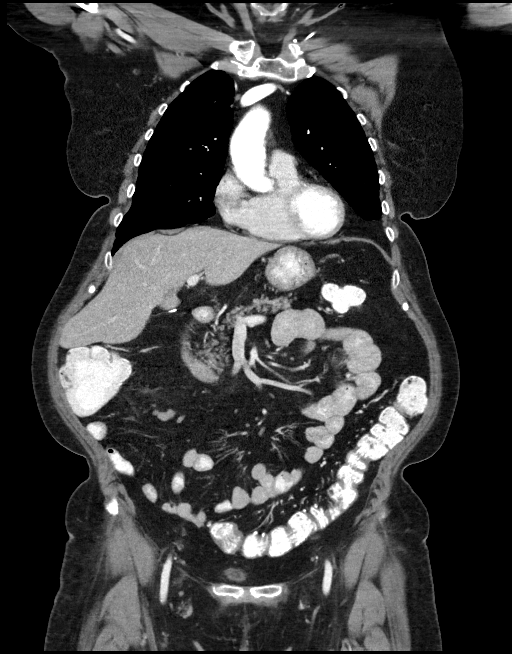
[im 86/144  mediastinal]
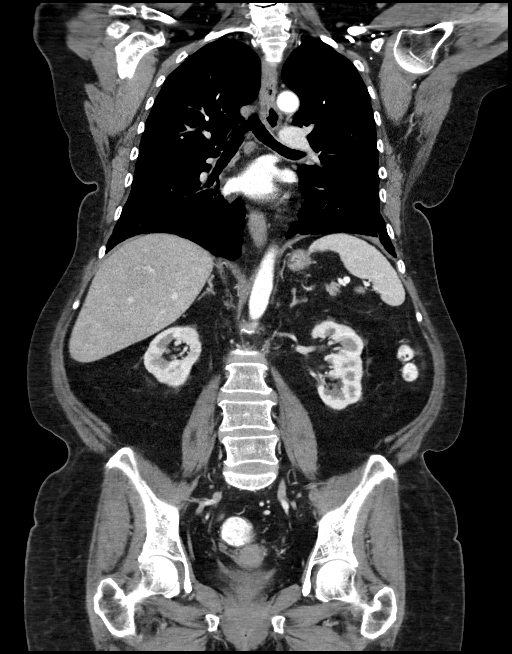

[Series 6: lung · axial · 0.91mm/px · z∈[-288,-240]mm · 2 of 144 slices shown]
[im 12/144  bone]
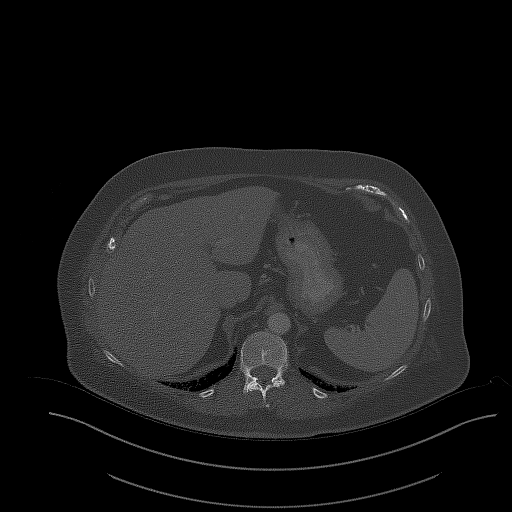
[im 36/144  bone]
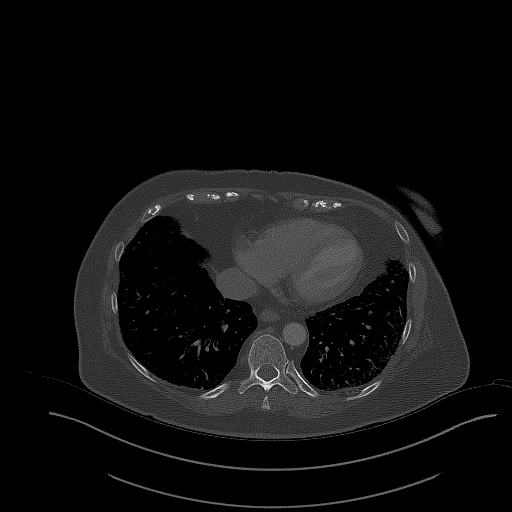

[14 of 36 positions shown; findings below may reference images not displayed]

FINDINGS: CT CHEST FINDINGS

Cardiovascular: The heart size is normal. No substantial pericardial
effusion. Coronary artery calcification is evident. No thoracic
aortic aneurysm.

Mediastinum/Nodes: Small mediastinal lymph nodes are stable since
PET-CT [DATE]. No mediastinal or hilar lymphadenopathy on
today's exam. The esophagus has normal imaging features. There is no
axillary lymphadenopathy.

Lungs/Pleura: Scattered bilateral pulmonary nodules again noted. 7
mm right middle lobe nodule on 78/6 was 7 mm on previous PET-CT
(remeasured). 8 mm right lower lobe nodule on 110/6 is stable when
remeasured on prior study. 6 mm nodule in the posterior left
costophrenic sulcus is unchanged. 6 mm left lower lobe nodule on
102/6 was 6 mm previously (remeasured). No new or progressive
pulmonary nodule or mass. No focal airspace consolidation. No
pleural effusion.

Musculoskeletal: No worrisome lytic or sclerotic osseous
abnormality.

CT ABDOMEN PELVIS FINDINGS

Hepatobiliary: No suspicious focal abnormality within the liver
parenchyma. Gallbladder is surgically absent. No intrahepatic or
extrahepatic biliary dilation.

Pancreas: No focal mass lesion. No dilatation of the main duct. No
intraparenchymal cyst. No peripancreatic edema.

Spleen: No splenomegaly. No focal mass lesion.

Adrenals/Urinary Tract: No adrenal nodule or mass. Kidneys
unremarkable. No evidence for hydroureter. The urinary bladder
appears normal for the degree of distention.

Stomach/Bowel: Stomach is unremarkable. No gastric wall thickening.
No evidence of outlet obstruction. Duodenum is normally positioned
as is the ligament of Treitz. Duodenal diverticulum noted. No small
bowel wall thickening. No small bowel dilatation. Status post right
hemicolectomy Diverticular changes are noted in the left colon
without evidence of diverticulitis.

Vascular/Lymphatic: There is abdominal aortic atherosclerosis
without aneurysm. There is no gastrohepatic or hepatoduodenal
ligament lymphadenopathy. No retroperitoneal or mesenteric
lymphadenopathy.

Multiple small omental soft tissue nodules again identified. 7 mm
left paramidline nodule on 80/2 today is stable when remeasured in a
similar fashion on the prior study. 8 mm omental nodule contained
within a supraumbilical ventral hernia may reflect the 9 mm midline
omental nodule measured previously. 18 x 7 mm right mesenteric
nodule measured previously is 16 x 7 mm today on 83/2. Several
additional tiny mesenteric nodules are again noted, similar to
prior.

No pelvic sidewall lymphadenopathy.

Reproductive: The uterus is unremarkable.  There is no adnexal mass.

Other: No intraperitoneal free fluid.

Musculoskeletal: No worrisome lytic or sclerotic osseous
abnormality. Multiple wide-mouth fat containing midline and
paramidline ventral hernia is evident.
IMPRESSION: 1. Stable exam. No new or progressive interval findings.
2. Scattered tiny lung nodules are stable since PET-CT of
[DATE]. These were hypermetabolic on that exam consistent with
metastatic disease.
3. Multiple small omental and mesenteric soft tissue nodules are
similar to prior. These were also noted to be hypermetabolic on the
PET study.
4. Status post ileocolectomy.
5. Left colonic diverticulosis without diverticulitis.
6. Aortic Atherosclerosis ([VR]-[VR]).

## 2020-05-03 MED ORDER — IOHEXOL 300 MG/ML  SOLN
100.0000 mL | Freq: Once | INTRAMUSCULAR | Status: AC | PRN
  Administered 2020-05-03: 100 mL via INTRAVENOUS

## 2020-05-03 NOTE — Progress Notes (Signed)
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 negative margines.   02/03/2014 Surgery   Laparoscopic-assisted right colectomy and resection of distal ileum   02/03/2014 Initial Diagnosis   Metastatic malignant neuroendocrine tumor of the ileocecal valve to regional lymph nodes.    05/31/2015 Imaging   CT A/P w contrast  IMPRESSION: 1. No evidence of metastatic disease. 2. Question mild basilar subpleural pulmonary fibrosis.   09/11/2019 Imaging   CT AP W contrast    IMPRESSION: 1. Redemonstrated postoperative findings of ileocolectomy and reanastomosis. 2. There are prominent lymph nodes in the right mesocolon which are slightly increased in size compared to prior examination, the largest node measuring 1.8 x 0.7 cm, previously 1.5 x 0.4 cm. Findings are nonspecific although concerning for nodal metastatic disease. 3. There are additional new small nodules of the transverse mesocolon or omentum measuring up to 9 mm, likewise concerning for nodal or omental metastatic disease. 4. Redemonstrated  broad-based midline, fat containing ventral hernia components. 5. Fluid in the endometrial cavity, abnormal in the late postmenopausal setting although unchanged compared to prior examination. 6. Status post interval cholecystectomy. 7. Aortic Atherosclerosis (ICD10-I70.0).   10/06/2019 PET scan   IMPRESSION: 1. Several small nodules in the peritoneal space and retroperitoneal space with intense radiotracer activity consistent with metastatic well differentiated neuroendocrine tumor. 2. Several small liver lesions above background activity are indeterminate. Consider MRI liver with and without contrast. 3. Radiotracer activity within mediastinal lymph nodes and bilateral pulmonary nodules consistent with metastatic well-differentiated neuroendocrine tumor to the lungs. Activity is very low within these lesions but measurable. 4. No clear evidence skeletal metastasis. Single lesion in the posterior LEFT SI joint warrants attention on follow-up. 5. Although there are multiple sites of metastatic disease, the overall tumor burden of metastatic disease very small.   10/28/2019 Relapse/Recurrence   FINAL MICROSCOPIC DIAGNOSIS:   A. PERITONEAL NODULES, BIOPSY:  -  Well-differentiated neuroendocrine tumor (carcinoid)  -  See comment   COMMENT:   By immunohistochemistry, the neoplastic cells are positive for CD56,  chromogranin and synaptophysin with a low proliferative rate by Ki-67  (less than 1%).  These findings are consistent with metastasis of the  patient's previously diagnosed small bowel carcinoid.  These findings  were discussed with Dr. Blake Divine on October 30, 2019.    11/06/2019 -  Chemotherapy   First-line Monthly Sandostatin injection starting 11/06/19. Held since 01/05/20 due to poor toleration ( hypoglycemia, fatigue, and the episode of syncope and fall right after the injection. The diarrhea and flushing has, but overall symptoms are mild)    05/03/2020 Imaging   CT CAP   IMPRESSION: 1. Stable exam.  No new or progressive interval findings. 2. Scattered tiny lung nodules are stable since PET-CT of 10/06/2019. These were hypermetabolic on that exam consistent with metastatic disease. 3. Multiple small omental and mesenteric soft tissue nodules are similar to prior. These were also noted to be hypermetabolic on the PET study. 4. Status post ileocolectomy. 5. Left colonic diverticulosis without diverticulitis. 6. Aortic Atherosclerosis (ICD10-I70.0).        CURRENT THERAPY:  First-line Monthly Sandostatin injection starting 11/06/19. Held since 01/05/20 due to poor toleration.    INTERVAL HISTORY:  Jessica Farrell is here for a follow up. She presents to the clinic alone. She is doing well overall, no pain or N/V. Appetite is great, she has mild fatigue, still able to do most of her routine activities and still works part time at [employer]  No flushing, but still has diarrhea with BM 3-4 daily with imodium 1 tab daily, occasional cramps with diarrhea  No weight loss recently   All other systems were reviewed with the patient and are negative.  MEDICAL HISTORY:  Past Medical History:  Diagnosis Date  . Asthma   . Cellulitis March  2014   Left foot and ankle  . colon ca dx'd 01/2014  . Colon polyps    2015   . Diabetes mellitus ([place]) 05/05/2012   Type II  . Hyperlipidemia   . Morbid obesity ([place])   . Thyroid disease 1990's   HypoThyroidism    SURGICAL HISTORY: Past Surgical History:  Procedure Laterality Date  . CHOLECYSTECTOMY N/A 05/02/2017   Procedure: LAPAROSCOPIC CHOLECYSTECTOMY WITH INTRAOPERATIVE CHOLANGIOGRAM, VENTRAL HERNIA REPAIR;  Surgeon: Jovita Kussmaul, MD;  Location: [facility];  Service: General;  Laterality: N/A;  . COLON RESECTION N/A 02/03/2014   Procedure: LAPAROSCOPIC ASSISTED BOWEL RESECTION;  Surgeon: Jackolyn Confer, MD;  Location: [facility];  Service: General;  Laterality: N/A;  . FRACTURE SURGERY  2000   Ankle  . TOOTH  EXTRACTION     wisdom Teeth    I have reviewed the social history and family history with the patient and they are unchanged from previous note.  ALLERGIES:  is allergic to penicillins.  MEDICATIONS:  Current Outpatient Medications  Medication Sig Dispense Refill  . albuterol (VENTOLIN HFA) 108 (90 Base) MCG/ACT inhaler Inhale 2 puffs into the lungs every 6 (six) hours as needed for wheezing or shortness of breath. 3 each 8  . atorvastatin (LIPITOR) 10 MG tablet Take 1 tablet (10 mg total) by mouth daily. 90 tablet 1  . cetirizine (ZYRTEC) 10 MG tablet Take 1 tablet (10 mg total) by mouth daily. 15 tablet 0  . dapagliflozin propanediol (FARXIGA) 5 MG TABS tablet Take 1 tablet (5 mg total) by mouth daily before breakfast. 90 tablet 1  . Fluticasone-Salmeterol (ADVAIR) 100-50 MCG/DOSE AEPB Inhale 1 puff into the lungs 2 (two) times daily. 180 each 3  . gabapentin (NEURONTIN) 100 MG capsule Take 1 capsule (100 mg total) by mouth at bedtime. 90 capsule 1  . glucose blood test strip Based on insurance preference. Use as instructed once daily to check sugars. Dx E11.9 100 each 12  . Lancets (ONETOUCH ULTRASOFT) lancets Use as instructed once daily to check sugars. Dx E11.9 100 each 12  . levothyroxine (SYNTHROID) 137 MCG tablet TAKE 1 TABLET DAILY BEFORE BREAKFAST. 90 tablet 1  . loperamide (IMODIUM) 1 MG/5ML solution Take by mouth as needed for diarrhea or loose stools.    . metFORMIN (GLUCOPHAGE XR) 750 MG 24 hr tablet Take 2  tablets (1,500 mg total) by mouth daily with supper. 180 tablet 1  . montelukast (SINGULAIR) 10 MG tablet Take 1 tablet (10 mg total) by mouth at bedtime. 90 tablet 1  . Multiple Vitamins-Minerals (CENTRUM SILVER PO) Take 1 tablet by mouth daily.    . naproxen (NAPROSYN) 375 MG tablet Take 1 tablet (375 mg total) by mouth 2 (two) times daily. 20 tablet 0  . Phenylephrine HCl (SINEX REGULAR NA) Place 1 spray into the nose daily as needed (ALLERGIES).    . promethazine  (PHENERGAN) 12.5 MG tablet Take 1 tablet (12.5 mg total) by mouth every 6 (six) hours as needed for nausea or vomiting. 30 tablet 0   No current facility-administered medications for this visit.    PHYSICAL EXAMINATION: ECOG PERFORMANCE STATUS: 1 - Symptomatic but completely ambulatory  Vitals:   05/06/20 1114  BP: 123/74  Pulse: 81  Resp: 18  Temp: 97.9 F (36.6 C)  SpO2: 95%   Filed Weights   05/06/20 1114  Weight: 175 lb 8 oz (79.6 kg)    GENERAL:alert, no distress and comfortable SKIN: skin color, texture, turgor are normal, no rashes or significant lesions EYES: normal, Conjunctiva are pink and non-injected, sclera clear NECK: supple, thyroid normal size, non-tender, without nodularity LYMPH:  no palpable lymphadenopathy in the cervical, axillary  LUNGS: clear to auscultation and percussion with normal breathing effort HEART: regular rate & rhythm and no murmurs and no lower extremity edema ABDOMEN:abdomen soft, non-tender and normal bowel sounds Musculoskeletal:no cyanosis of digits and no clubbing  NEURO: alert & oriented x 3 with fluent speech, no focal motor/sensory deficits  LABORATORY DATA:  I have reviewed the data as listed CBC Latest Ref Rng & Units 05/03/2020 04/22/2020 01/18/2020  WBC 4.0 - 10.5 K/uL 7.5 7.4 8.5  Hemoglobin 12.0 - 15.0 g/dL 11.5(L) 12.0 12.9  Hematocrit 36.0 - 46.0 % 35.6(L) 36.3 38.6  Platelets 150 - 400 K/uL 219 221.0 225     CMP Latest Ref Rng & Units 05/03/2020 04/22/2020 01/18/2020  Glucose 70 - 99 mg/dL 134(H) 173(H) 220(H)  BUN 8 - 23 mg/dL $Remove'14 16 17  'VaUxaZx$ Creatinine 0.44 - 1.00 mg/dL 0.92 0.95 1.03(H)  Sodium 135 - 145 mmol/L 136 137 137  Potassium 3.5 - 5.1 mmol/L 4.5 4.1 3.9  Chloride 98 - 111 mmol/L 105 101 97(L)  CO2 22 - 32 mmol/L $RemoveB'26 28 29  'oAvRWRIz$ Calcium 8.9 - 10.3 mg/dL 9.2 9.2 8.9  Total Protein 6.5 - 8.1 g/dL 7.1 7.2 -  Total Bilirubin 0.3 - 1.2 mg/dL 0.3 0.5 -  Alkaline Phos 38 - 126 U/L 150(H) 193(H) -  AST 15 - 41 U/L 15 20 -   ALT 0 - 44 U/L 18 23 -      RADIOGRAPHIC STUDIES: I have personally reviewed the radiological images as listed and agreed with the findings in the report. No results found.   ASSESSMENT & PLAN:  Jessica Farrell is a 75 y.o. female with    1. 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 Her tumor has been completely resected. Surgical margins were negative. -10/06/19 Dotatate PET showedmetastatic disease to peritoneum, abdominal and mediastinal lymph nodes, and the lungs. Indeterminate liver lesions. 10/29/19 peritoneal biopsy confirmed well-differentiated neuroendocrine tumor.  -With diffuse metastasis, her disease is not curable at this stage, but still treatable, especially  with low tumor burden. I started her on First-line Sandostatin injections monthly on 11/06/19 -She unfortunately did not tolerate Sandostatin injection well, has developed hyperglycemia, fatigue, and the episode of syncope and fall right after the injection. The diarrhea and flushing has persisted, but overall symptoms are mild. -Given her poor tolerance Sandostatin injection, and her overall low tumor burden, limited clinical symptoms, I recommend to hold treatment for now -I personally reviewed and discussed her CT CAP from 05/03/20 which shows stable disease in the lungs and peritoneum, no other new lesions -She is clinically stable, does have mild diarrhea and nausea, I recommend her to increase Imodium -We discussed starting treatment if she became more symptomatic. I would consider low dose Sandostatin or Afinitor. -Follow-up in 3 months.   2. HTN, DM, hypothyroidism, mild neuropathy  -She will continue medication and follow-up with her primary care physician. -She has mild  tingling of LE from DM. She rarely uses Gabapentin as needed.  -She has developed worsening hyperglycemia as she started Sandostatin -I encouraged her to follow-up with her primary care physician to better control her diabetes   3.Cholecystitis status post colectomy in March 2019    Plan: -Restaging scan reviewed, overall stable disease.  She is clinically stable. -She will increase Imodium for diarrhea control -She will follow up with her primary care physician for better diabetic control -Lab and follow-up in 3 months  No problem-specific Assessment & Plan notes found for this encounter.   No orders of the defined types were placed in this encounter.  All questions were answered. The patient knows to call the clinic with any problems, questions or concerns. No barriers to learning was detected. The total time spent in the appointment was 30 minutes.     Truitt Merle, MD 05/06/2020   I, Joslyn Devon, am acting as scribe for Truitt Merle, MD.   I have reviewed the above documentation for accuracy and completeness, and I agree with the above.

## 2020-05-05 LAB — CHROMOGRANIN A: Chromogranin A (ng/mL): 82 ng/mL (ref 0.0–101.8)

## 2020-05-06 ENCOUNTER — Other Ambulatory Visit: Payer: Self-pay

## 2020-05-06 ENCOUNTER — Inpatient Hospital Stay: Payer: Medicare HMO | Admitting: Hematology

## 2020-05-06 ENCOUNTER — Other Ambulatory Visit: Payer: Self-pay | Admitting: *Deleted

## 2020-05-06 ENCOUNTER — Encounter: Payer: Self-pay | Admitting: Hematology

## 2020-05-06 VITALS — BP 123/74 | HR 81 | Temp 97.9°F | Resp 18 | Ht 65.0 in | Wt 175.5 lb

## 2020-05-06 DIAGNOSIS — C7B04 Secondary carcinoid tumors of peritoneum: Secondary | ICD-10-CM | POA: Diagnosis not present

## 2020-05-06 DIAGNOSIS — C7A012 Malignant carcinoid tumor of the ileum: Secondary | ICD-10-CM | POA: Diagnosis not present

## 2020-05-06 DIAGNOSIS — Z79899 Other long term (current) drug therapy: Secondary | ICD-10-CM | POA: Diagnosis not present

## 2020-05-06 DIAGNOSIS — C7B8 Other secondary neuroendocrine tumors: Secondary | ICD-10-CM

## 2020-05-06 DIAGNOSIS — C7B01 Secondary carcinoid tumors of distant lymph nodes: Secondary | ICD-10-CM | POA: Diagnosis not present

## 2020-05-18 LAB — 5 HIAA, QUANTITATIVE, URINE, 24 HOUR
5-HIAA, Ur: 4 mg/L
5-HIAA,Quant.,24 Hr Urine: 5.8 mg/24 hr (ref 0.0–14.9)
Total Volume: 1450

## 2020-05-24 ENCOUNTER — Telehealth: Payer: Self-pay

## 2020-05-24 NOTE — Telephone Encounter (Signed)
-----   Message from Gardiner Rhyme, RN sent at 05/24/2020  2:24 PM EDT -----  ----- Message ----- From: Alla Feeling, NP Sent: 05/20/2020   1:23 PM EDT To: Arlice Colt Pod [facility]  Please let her know chromogranin A and urin 5HIAA tumor markers are normal, continue observation. See [place] back in June as planned unless diarrhea worsens or new pain/other concerns, etc.   Thanks, Regan Rakers, NP

## 2020-05-24 NOTE — Telephone Encounter (Signed)
Attempted to call pt regarding lab results per Cira Rue, NP. Pt did not answer. LVM for pt to return call.

## 2020-05-27 DIAGNOSIS — H52209 Unspecified astigmatism, unspecified eye: Secondary | ICD-10-CM | POA: Diagnosis not present

## 2020-05-27 DIAGNOSIS — H5213 Myopia, bilateral: Secondary | ICD-10-CM | POA: Diagnosis not present

## 2020-05-27 DIAGNOSIS — H524 Presbyopia: Secondary | ICD-10-CM | POA: Diagnosis not present

## 2020-07-15 ENCOUNTER — Telehealth: Payer: Self-pay | Admitting: Internal Medicine

## 2020-07-15 ENCOUNTER — Other Ambulatory Visit: Payer: Self-pay | Admitting: Internal Medicine

## 2020-07-15 DIAGNOSIS — E1165 Type 2 diabetes mellitus with hyperglycemia: Secondary | ICD-10-CM

## 2020-07-15 NOTE — Progress Notes (Signed)
  Chronic Care Management   Outreach Note  07/15/2020 Name: Jessica Farrell MRN: [id number] DOB: [birth year]  Referred by: Binnie Rail, MD Reason for referral : No chief complaint on file.   An unsuccessful telephone outreach was attempted today. The patient was referred to the pharmacist for assistance with care management and care coordination.   Follow Up Plan:   Lauretta Grill Upstream Scheduler

## 2020-07-25 ENCOUNTER — Encounter: Payer: Self-pay | Admitting: Internal Medicine

## 2020-07-25 NOTE — Progress Notes (Signed)
Subjective:    Patient ID: Jessica Farrell, female    DOB: 1945-06-28, 75 y.o.   MRN: [id number]  HPI The patient is here for follow up of their chronic medical problems, including DM, hld, hypothyroidism, asthma, tingling in legs  I started her on farxiga at her last visit. It was too expensive.  She did not let us know she did not start it.  She feels she is eating fairly well.  She is active, but not exercising regularly.  She still works.  She has left lateral hip pain and right lower back pain.  She usually takes Tylenol and it helps.  She does not feel that she needs this further evaluated at this time.  Medications and allergies reviewed with patient and updated if appropriate.  Patient Active Problem List   Diagnosis Date Noted  . Aortic atherosclerosis ([place]) 04/22/2020  . Asthma exacerbation 01/23/2020  . Tingling in extremities 05/23/2018  . Osteopenia 03/27/2018  . Umbilical hernia without obstruction or gangrene 11/21/2017  . Onychomycosis 05/22/2017  . Athlete's foot 05/22/2017  . Carcinoid tumor of cecum 01/26/2017  . 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 ([place]) 02/18/2014  . Chronic venous insufficiency 08/02/2012  . HLD (hyperlipidemia) 05/15/2012  . Diabetes mellitus ([place]) 05/05/2012  . Hypothyroidism 05/05/2012  . Asthma 05/05/2012    Current Outpatient Medications on File Prior to Visit  Medication Sig Dispense Refill  . albuterol (VENTOLIN HFA) 108 (90 Base) MCG/ACT inhaler Inhale 2 puffs into the lungs every 6 (six) hours as needed for wheezing or shortness of breath. 3 each 8  . atorvastatin (LIPITOR) 10 MG tablet TAKE 1 TABLET (10 MG TOTAL) BY MOUTH DAILY. 90 tablet 1  . cetirizine (ZYRTEC) 10 MG tablet Take 1 tablet (10 mg total) by mouth daily. 15 tablet 0  . gabapentin (NEURONTIN) 100 MG capsule Take 1 capsule (100 mg total) by mouth at bedtime. 90 capsule 1  . glucose blood test strip Based on insurance preference. Use as  instructed once daily to check sugars. Dx E11.9 100 each 12  . Lancets (ONETOUCH ULTRASOFT) lancets Use as instructed once daily to check sugars. Dx E11.9 100 each 12  . levothyroxine (SYNTHROID) 137 MCG tablet TAKE 1 TABLET DAILY BEFORE BREAKFAST. 90 tablet 1  . loperamide (IMODIUM) 1 MG/5ML solution Take by mouth as needed for diarrhea or loose stools.    . metFORMIN (GLUCOPHAGE XR) 750 MG 24 hr tablet Take 2 tablets (1,500 mg total) by mouth daily with supper. 180 tablet 1  . montelukast (SINGULAIR) 10 MG tablet TAKE 1 TABLET (10 MG TOTAL) BY MOUTH AT BEDTIME. 90 tablet 1  . Multiple Vitamins-Minerals (CENTRUM SILVER PO) Take 1 tablet by mouth daily.    . naproxen (NAPROSYN) 375 MG tablet Take 1 tablet (375 mg total) by mouth 2 (two) times daily. 20 tablet 0   No current facility-administered medications on file prior to visit.    Past Medical History:  Diagnosis Date  . Asthma   . Cellulitis March  2014   Left foot and ankle  . colon ca dx'd 01/2014  . Colon polyps    2015   . Diabetes mellitus ([place]) 05/05/2012   Type II  . Hyperlipidemia   . Morbid obesity ([place])   . Thyroid disease 1990's   HypoThyroidism    Past Surgical History:  Procedure Laterality Date  . CHOLECYSTECTOMY N/A 05/02/2017   Procedure: LAPAROSCOPIC CHOLECYSTECTOMY WITH INTRAOPERATIVE CHOLANGIOGRAM, VENTRAL HERNIA REPAIR;  Surgeon: Autumn Messing  III, MD;  Location: [facility];  Service: General;  Laterality: N/A;  . COLON RESECTION N/A 02/03/2014   Procedure: LAPAROSCOPIC ASSISTED BOWEL RESECTION;  Surgeon: Jackolyn Confer, MD;  Location: [facility];  Service: General;  Laterality: N/A;  . FRACTURE SURGERY  2000   Ankle  . TOOTH EXTRACTION     wisdom Teeth    Social History   Socioeconomic History  . Marital status: Divorced    Spouse name: Not on file  . Number of children: Not on file  . Years of education: Not on file  . Highest education level: Not on file  Occupational History  . Not on file  Tobacco  Use  . Smoking status: Never Smoker  . Smokeless tobacco: Never Used  Vaping Use  . Vaping Use: Never used  Substance and Sexual Activity  . Alcohol use: No  . Drug use: No  . Sexual activity: Not Currently    Birth control/protection: Post-menopausal  Other Topics Concern  . Not on file  Social History Narrative  . Not on file   Social Determinants of Health   Financial Resource Strain: Not on file  Food Insecurity: Not on file  Transportation Needs: Not on file  Physical Activity: Not on file  Stress: Not on file  Social Connections: Not on file    Family History  Problem Relation Age of Onset  . Alzheimer's disease Father   . Heart disease Mother   . Arthritis Mother   . Hypertension Mother   . Alzheimer's disease Sister   . Cancer Sister 71       breast cancer   . Heart disease Brother        Heart Disease before age 7  . Breast cancer Sister   . Colon cancer Neg Hx   . Esophageal cancer Neg Hx   . Rectal cancer Neg Hx   . Stomach cancer Neg Hx     Review of Systems  Constitutional: Negative for chills and fever.  Respiratory: Positive for cough and wheezing. Negative for shortness of breath.   Cardiovascular: Positive for leg swelling. Negative for chest pain and palpitations.  Neurological: Positive for headaches (occ). Negative for light-headedness.       Objective:   Vitals:   07/26/20 0941  BP: 102/70  Pulse: 82  Temp: 97.9 F (36.6 C)  SpO2: 96%   BP Readings from Last 3 Encounters:  07/26/20 102/70  05/06/20 123/74  04/22/20 114/80   Wt Readings from Last 3 Encounters:  07/26/20 179 lb (81.2 kg)  05/06/20 175 lb 8 oz (79.6 kg)  04/22/20 176 lb (79.8 kg)   Body mass index is 29.79 kg/m.   Physical Exam    Constitutional: Appears well-developed and well-nourished. No distress.  HENT:  Head: Normocephalic and atraumatic.  Neck: Neck supple. No tracheal deviation present. No thyromegaly present.  No cervical  lymphadenopathy Cardiovascular: Normal rate, regular rhythm and normal heart sounds.   No murmur heard. No carotid bruit .  Trace bilateral lower extremity edema Pulmonary/Chest: Effort normal and breath sounds normal. No respiratory distress. No has no wheezes. No rales.  Skin: Skin is warm and dry. Not diaphoretic.  Psychiatric: Normal mood and affect. Behavior is normal.      Assessment & Plan:    See Problem List for Assessment and Plan of chronic medical problems.    This visit occurred during the SARS-CoV-2 public health emergency.  Safety protocols were in place, including screening questions prior to the  visit, additional usage of staff PPE, and extensive cleaning of exam room while observing appropriate contact time as indicated for disinfecting solutions.

## 2020-07-25 NOTE — Patient Instructions (Addendum)
  Blood work was ordered.     Medications changes include :   Increase advair to 250/50  Your prescription(s) have been submitted to your pharmacy. Please take as directed and contact our office if you believe you are having problem(s) with the medication(s).    Please followup in September as scheduled.

## 2020-07-26 ENCOUNTER — Telehealth: Payer: Self-pay | Admitting: Internal Medicine

## 2020-07-26 ENCOUNTER — Other Ambulatory Visit: Payer: Self-pay

## 2020-07-26 ENCOUNTER — Ambulatory Visit: Payer: Medicare HMO | Admitting: Internal Medicine

## 2020-07-26 VITALS — BP 102/70 | HR 82 | Temp 97.9°F | Ht 65.0 in | Wt 179.0 lb

## 2020-07-26 DIAGNOSIS — R202 Paresthesia of skin: Secondary | ICD-10-CM | POA: Diagnosis not present

## 2020-07-26 DIAGNOSIS — E1165 Type 2 diabetes mellitus with hyperglycemia: Secondary | ICD-10-CM

## 2020-07-26 DIAGNOSIS — E7849 Other hyperlipidemia: Secondary | ICD-10-CM | POA: Diagnosis not present

## 2020-07-26 DIAGNOSIS — E038 Other specified hypothyroidism: Secondary | ICD-10-CM | POA: Diagnosis not present

## 2020-07-26 DIAGNOSIS — J4520 Mild intermittent asthma, uncomplicated: Secondary | ICD-10-CM

## 2020-07-26 LAB — CBC WITH DIFFERENTIAL/PLATELET
Basophils Absolute: 0 10*3/uL (ref 0.0–0.1)
Basophils Relative: 0.6 % (ref 0.0–3.0)
Eosinophils Absolute: 0.3 10*3/uL (ref 0.0–0.7)
Eosinophils Relative: 3.2 % (ref 0.0–5.0)
HCT: 33.9 % — ABNORMAL LOW (ref 36.0–46.0)
Hemoglobin: 11.5 g/dL — ABNORMAL LOW (ref 12.0–15.0)
Lymphocytes Relative: 28.2 % (ref 12.0–46.0)
Lymphs Abs: 2.3 10*3/uL (ref 0.7–4.0)
MCHC: 33.8 g/dL (ref 30.0–36.0)
MCV: 87.7 fl (ref 78.0–100.0)
Monocytes Absolute: 0.5 10*3/uL (ref 0.1–1.0)
Monocytes Relative: 6.5 % (ref 3.0–12.0)
Neutro Abs: 4.9 10*3/uL (ref 1.4–7.7)
Neutrophils Relative %: 61.5 % (ref 43.0–77.0)
Platelets: 217 10*3/uL (ref 150.0–400.0)
RBC: 3.86 Mil/uL — ABNORMAL LOW (ref 3.87–5.11)
RDW: 13 % (ref 11.5–15.5)
WBC: 8 10*3/uL (ref 4.0–10.5)

## 2020-07-26 LAB — COMPREHENSIVE METABOLIC PANEL
ALT: 27 U/L (ref 0–35)
AST: 27 U/L (ref 0–37)
Albumin: 3.7 g/dL (ref 3.5–5.2)
Alkaline Phosphatase: 153 U/L — ABNORMAL HIGH (ref 39–117)
BUN: 23 mg/dL (ref 6–23)
CO2: 26 mEq/L (ref 19–32)
Calcium: 9.3 mg/dL (ref 8.4–10.5)
Chloride: 105 mEq/L (ref 96–112)
Creatinine, Ser: 0.96 mg/dL (ref 0.40–1.20)
GFR: 58 mL/min — ABNORMAL LOW (ref >60.00)
Glucose, Bld: 140 mg/dL — ABNORMAL HIGH (ref 70–99)
Potassium: 4.8 mEq/L (ref 3.5–5.1)
Sodium: 139 mEq/L (ref 135–145)
Total Bilirubin: 0.3 mg/dL (ref 0.2–1.2)
Total Protein: 7 g/dL (ref 6.0–8.3)

## 2020-07-26 LAB — TSH: TSH: 16.87 u[IU]/mL — ABNORMAL HIGH (ref 0.35–4.50)

## 2020-07-26 LAB — HEMOGLOBIN A1C: Hgb A1c MFr Bld: 7.4 % — ABNORMAL HIGH (ref 4.6–6.5)

## 2020-07-26 MED ORDER — FLUTICASONE-SALMETEROL 250-50 MCG/ACT IN AEPB: 1.0000 | INHALATION_SPRAY | Freq: Two times a day (BID) | RESPIRATORY_TRACT | 5 refills | Status: DC

## 2020-07-26 NOTE — Assessment & Plan Note (Addendum)
Chronic Lab Results  Component Value Date   HGBA1C 7.8 (H) 04/22/2020  not controlled - last visit added farxiga, but it was too expensive and she did not let us know that Check a1c Continue metformin xr 1500 mg w/ dinner Will likely need additional medication - will see what a1c is-she would consider retrying Rybelsus

## 2020-07-26 NOTE — Progress Notes (Signed)
  Chronic Care Management   Outreach Note  07/26/2020 Name: Jessica Farrell MRN: [id number] DOB: [birth year]  Referred by: Binnie Rail, MD Reason for referral : No chief complaint on file.   An unsuccessful telephone outreach was attempted today. The patient was referred to the pharmacist for assistance with care management and care coordination.   Follow Up Plan:   Lauretta Grill Upstream Scheduler

## 2020-07-26 NOTE — Assessment & Plan Note (Addendum)
Chronic  Clinically euthyroid Currently taking levothyroxine 137 mcg daily-she states she is taking this daily Check tsh  Titrate med dose if needed

## 2020-07-26 NOTE — Assessment & Plan Note (Addendum)
Chronic Likely neuropathy Continue gabapentin 100 mg HS prn - ? helping Stressed better control of DM

## 2020-07-26 NOTE — Assessment & Plan Note (Signed)
Chronic Check lipid panel  Continue atorvastatin 10 mg daily Regular exercise and healthy diet encouraged  

## 2020-07-26 NOTE — Assessment & Plan Note (Addendum)
Chronic Mild, intermittent Not controlled - has pets she is allergic to Taking Advair 100-50 mcg twice daily  - increase to Advair to 250/50 Continue albuterol inhaler prn Continue singulair 10 mg daily, zyrtec 10 mg daily

## 2020-07-27 ENCOUNTER — Telehealth: Payer: Self-pay | Admitting: Internal Medicine

## 2020-07-27 ENCOUNTER — Other Ambulatory Visit: Payer: Self-pay | Admitting: Internal Medicine

## 2020-07-27 MED ORDER — LEVOTHYROXINE SODIUM 137 MCG PO TABS: ORAL_TABLET | ORAL | 1 refills | Status: DC

## 2020-07-27 NOTE — Telephone Encounter (Signed)
Left message for patient to return call to clinic for results.

## 2020-07-27 NOTE — Telephone Encounter (Signed)
Patient is requesting a call back in regards to recent lab work. Please advise

## 2020-07-29 MED ORDER — RYBELSUS 3 MG PO TABS: 3.0000 mg | ORAL_TABLET | Freq: Every day | ORAL | 1 refills | Status: DC

## 2020-07-29 NOTE — Addendum Note (Signed)
Addended by: Binnie Rail on: 07/29/2020 08:51 PM   Modules accepted: Orders

## 2020-07-29 NOTE — Telephone Encounter (Signed)
   Patient returned call. She can be reached at [phone]

## 2020-07-29 NOTE — Telephone Encounter (Signed)
Results given.

## 2020-08-04 ENCOUNTER — Other Ambulatory Visit: Payer: Self-pay

## 2020-08-04 DIAGNOSIS — C7B8 Other secondary neuroendocrine tumors: Secondary | ICD-10-CM

## 2020-08-04 NOTE — Progress Notes (Signed)
[place]   [phone] [phone]   Clinic Follow up Note   Patient Care Team: Binnie Rail, MD as PCP - General (Internal Medicine) Truitt Merle, MD as Consulting Physician (Hematology) Jackolyn Confer, MD as Consulting Physician (General Surgery)  Date of Service:  08/05/2020  CHIEF COMPLAINT: f/u of metastatic carcinoid tumor  SUMMARY OF ONCOLOGIC HISTORY: Oncology History Overview Note  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   Staging form: Colon and Rectum, AJCC 7th Edition     Clinical: T3, N2, M0 - Unsigned      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 ([place])  01/21/2014 Imaging   CT: Distal small bowel obstruction due to 3.4 cm soft tissue mass near the ileocecal valve, with adjacent right lower quadrant mesenteric lymphadenopathy    02/03/2014 Pathologic Stage   invasive well diff neuroendocrine tumor (carcinoid), 3cm, pT3pN2 (5/25 nodes positive). negative margines.    02/03/2014 Surgery   Laparoscopic-assisted right colectomy and resection of distal ileum    02/03/2014 Initial Diagnosis   Metastatic malignant neuroendocrine tumor of the ileocecal valve to regional lymph nodes.     05/31/2015 Imaging   CT A/P w contrast  IMPRESSION: 1. No evidence of metastatic disease. 2. Question mild basilar subpleural pulmonary fibrosis.    09/11/2019 Imaging   CT AP W contrast    IMPRESSION: 1. Redemonstrated postoperative findings of ileocolectomy and reanastomosis. 2. There are prominent lymph nodes in the right mesocolon which are slightly increased in size compared to prior examination, the largest node measuring 1.8 x 0.7 cm, previously 1.5 x 0.4 cm. Findings are nonspecific although concerning for nodal metastatic disease. 3. There are additional new small nodules of the transverse mesocolon or omentum measuring up to 9 mm, likewise concerning for nodal or omental metastatic disease. 4.  Redemonstrated broad-based midline, fat containing ventral hernia components. 5. Fluid in the endometrial cavity, abnormal in the late postmenopausal setting although unchanged compared to prior examination. 6. Status post interval cholecystectomy. 7. Aortic Atherosclerosis (ICD10-I70.0).   10/06/2019 PET scan   IMPRESSION: 1. Several small nodules in the peritoneal space and retroperitoneal space with intense radiotracer activity consistent with metastatic well differentiated neuroendocrine tumor. 2. Several small liver lesions above background activity are indeterminate. Consider MRI liver with and without contrast. 3. Radiotracer activity within mediastinal lymph nodes and bilateral pulmonary nodules consistent with metastatic well-differentiated neuroendocrine tumor to the lungs. Activity is very low within these lesions but measurable. 4. No clear evidence skeletal metastasis. Single lesion in the posterior LEFT SI joint warrants attention on follow-up. 5. Although there are multiple sites of metastatic disease, the overall tumor burden of metastatic disease very small.   10/28/2019 Relapse/Recurrence   FINAL MICROSCOPIC DIAGNOSIS:   A. PERITONEAL NODULES, BIOPSY:  -  Well-differentiated neuroendocrine tumor (carcinoid)  -  See comment   COMMENT:   By immunohistochemistry, the neoplastic cells are positive for CD56,  chromogranin and synaptophysin with a low proliferative rate by Ki-67  (less than 1%).  These findings are consistent with metastasis of the  patient's previously diagnosed small bowel carcinoid.  These findings  were discussed with Dr. Morey Hummingbird on October 30, 2019.    11/06/2019 -  Chemotherapy   First-line Monthly Sandostatin injection starting 11/06/19. Held since 01/05/20 due to poor toleration ( hypoglycemia, fatigue, and the episode of syncope and fall right after the injection. The diarrhea and flushing has, but overall symptoms are mild)    05/03/2020  Imaging  CT CAP  IMPRESSION: 1. Stable exam. No new or progressive interval findings. 2. Scattered tiny lung nodules are stable since PET-CT of 10/06/2019. These were hypermetabolic on that exam consistent with metastatic disease. 3. Multiple small omental and mesenteric soft tissue nodules are similar to prior. These were also noted to be hypermetabolic on the PET study. 4. Status post ileocolectomy. 5. Left colonic diverticulosis without diverticulitis. 6. Aortic Atherosclerosis (ICD10-I70.0).        CURRENT THERAPY:  First-line Monthly Sandostatin injection starting 11/06/19. Held since 01/05/20 due to poor toleration.  INTERVAL HISTORY:  Jessica Farrell is here for a follow up of metastatic carcinoid tumor. She was last seen by me on 05/06/20. She presents to the clinic alone. She is doing okay overall. She notes some joint pains. She denies cramps. She reports her bowel movements are improving.  She continues to work part time at [employer].  All other systems were reviewed with the patient and are negative.  MEDICAL HISTORY:  Past Medical History:  Diagnosis Date   Asthma    Cellulitis March  2014   Left foot and ankle   colon ca dx'd 01/2014   Colon polyps    2015    Diabetes mellitus ([place]) 05/05/2012   Type II   Hyperlipidemia    Morbid obesity ([place])    Thyroid disease 1990's   HypoThyroidism    SURGICAL HISTORY: Past Surgical History:  Procedure Laterality Date   CHOLECYSTECTOMY N/A 05/02/2017   Procedure: LAPAROSCOPIC CHOLECYSTECTOMY WITH INTRAOPERATIVE CHOLANGIOGRAM, VENTRAL HERNIA REPAIR;  Surgeon: Jovita Kussmaul, MD;  Location: [facility];  Service: General;  Laterality: N/A;   COLON RESECTION N/A 02/03/2014   Procedure: LAPAROSCOPIC ASSISTED BOWEL RESECTION;  Surgeon: Jackolyn Confer, MD;  Location: [facility];  Service: General;  Laterality: N/A;   FRACTURE SURGERY  2000   Ankle   TOOTH EXTRACTION     wisdom Teeth    I have reviewed the social history and  family history with the patient and they are unchanged from previous note.  ALLERGIES:  is allergic to penicillins.  MEDICATIONS:  Current Outpatient Medications  Medication Sig Dispense Refill   albuterol (VENTOLIN HFA) 108 (90 Base) MCG/ACT inhaler Inhale 2 puffs into the lungs every 6 (six) hours as needed for wheezing or shortness of breath. 3 each 8   atorvastatin (LIPITOR) 10 MG tablet TAKE 1 TABLET (10 MG TOTAL) BY MOUTH DAILY. 90 tablet 1   cetirizine (ZYRTEC) 10 MG tablet Take 1 tablet (10 mg total) by mouth daily. 15 tablet 0   fluticasone-salmeterol (ADVAIR DISKUS) 250-50 MCG/ACT AEPB Inhale 1 puff into the lungs in the morning and at bedtime. 60 each 5   gabapentin (NEURONTIN) 100 MG capsule Take 1 capsule (100 mg total) by mouth at bedtime. 90 capsule 1   glucose blood test strip Based on insurance preference. Use as instructed once daily to check sugars. Dx E11.9 100 each 12   Lancets (ONETOUCH ULTRASOFT) lancets Use as instructed once daily to check sugars. Dx E11.9 100 each 12   levothyroxine (SYNTHROID) 137 MCG tablet Take 1 tablet daily p.o. before breakfast 6 days a week.  Take 2 tablets daily p.o. before breakfast 1 day a week. 102 tablet 1   loperamide (IMODIUM) 1 MG/5ML solution Take by mouth as needed for diarrhea or loose stools.     metFORMIN (GLUCOPHAGE XR) 750 MG 24 hr tablet Take 2 tablets (1,500 mg total) by mouth daily with supper. 180 tablet 1   montelukast (  SINGULAIR) 10 MG tablet TAKE 1 TABLET (10 MG TOTAL) BY MOUTH AT BEDTIME. 90 tablet 1   Multiple Vitamins-Minerals (CENTRUM SILVER PO) Take 1 tablet by mouth daily.     naproxen (NAPROSYN) 375 MG tablet Take 1 tablet (375 mg total) by mouth 2 (two) times daily. 20 tablet 0   Semaglutide (RYBELSUS) 3 MG TABS Take 3 mg by mouth daily before breakfast. 30 tablet 1   No current facility-administered medications for this visit.    PHYSICAL EXAMINATION: ECOG PERFORMANCE STATUS: 1 - Symptomatic but completely  ambulatory  There were no vitals filed for this visit. There were no vitals filed for this visit.  GENERAL:alert, no distress and comfortable SKIN: skin color, texture, turgor are normal, no rashes or significant lesions EYES: normal, Conjunctiva are pink and non-injected, sclera clear  NECK: supple, thyroid normal size, non-tender, without nodularity LYMPH:  no palpable lymphadenopathy in the cervical, axillary  LUNGS: clear to auscultation and percussion with normal breathing effort HEART: regular rate & rhythm and no murmurs and no lower extremity edema ABDOMEN:abdomen soft, non-tender and normal bowel sounds Musculoskeletal:no cyanosis of digits and no clubbing  NEURO: alert & oriented x 3 with fluent speech, no focal motor/sensory deficits  LABORATORY DATA:  I have reviewed the data as listed CBC Latest Ref Rng & Units 08/05/2020 07/26/2020 05/03/2020  WBC 4.0 - 10.5 K/uL 7.6 8.0 7.5  Hemoglobin 12.0 - 15.0 g/dL 10.7(L) 11.5(L) 11.5(L)  Hematocrit 36.0 - 46.0 % 33.8(L) 33.9(L) 35.6(L)  Platelets 150 - 400 K/uL 216 217.0 219     CMP Latest Ref Rng & Units 08/05/2020 07/26/2020 05/03/2020  Glucose 70 - 99 mg/dL 128(H) 140(H) 134(H)  BUN 8 - 23 mg/dL _0 Creatinine 0.44 - 1.00 mg/dL 0.83 0.96 0.92  Sodium 135 - 145 mmol/L 140 139 136  Potassium 3.5 - 5.1 mmol/L 4.7 4.8 4.5  Chloride 98 - 111 mmol/L 106 105 105  CO2 22 - 32 mmol/L _1 Calcium 8.9 - 10.3 mg/dL 9.3 9.3 9.2  Total Protein 6.5 - 8.1 g/dL 7.0 7.0 7.1  Total Bilirubin 0.3 - 1.2 mg/dL 0.3 0.3 0.3  Alkaline Phos 38 - 126 U/L 150(H) 153(H) 150(H)  AST 15 - 41 U/L _2 ALT 0 - 44 U/L _3 RADIOGRAPHIC STUDIES: I have personally reviewed the radiological images as listed and agreed with the findings in the report. No results found.   ASSESSMENT & PLAN:  Jessica Farrell is a 75 y.o. female with   1. Well differentiated low-grade neuroendocrine tumor of terminal ileum (carcinoid tumor), pT3  N1 M0, stage IIIB, peritoneal metastasis 09/2019, probable lung and mediastinal nodules metastasis, indeterminate liver lesions, overall low tumor burden  -She was initially diagnosed in 01/2014. Her tumor has been completely resected. Surgical margins were negative. -10/06/19 Dotatate PET showed metastatic disease to peritoneum, abdominal and mediastinal lymph nodes, and the lungs. Indeterminate liver lesions. 10/29/19 peritoneal biopsy confirmed well-differentiated neuroendocrine tumor. -With diffuse metastasis, her disease is not curable at this stage, but still treatable, especially with low tumor burden. I started her on First-line Sandostatin injections monthly on 11/06/19 -She unfortunately did not tolerate Sandostatin injection well, has developed hyperglycemia, fatigue, and the episode of syncope and fall right after the injection. The diarrhea and flushing has persisted, but overall symptoms are mild. -Given her poor tolerance Sandostatin injection, and her overall low tumor burden, limited clinical symptoms, I recommend to hold  treatment for now -CT CAP from 05/03/20 which shows stable disease in the lungs and peritoneum, no other new lesions -She is clinically stable and exam is unremarkable today. -We reviewed starting treatment if she becomes more symptomatic. I would consider low dose Sandostatin or Afinitor. -Follow-up in 3 months. Will order restaging scan on next visit   2. Mild anemia -Hgb 10.7 today (08/05/20). This is new. -I will add iron panel to her labs next visit. -I recommend she find a multivitamin with extra iron, B-12 and folate. She can switch to prenatal if her current multivitamin does not have enough. -she is overdue for colonoscopy, I will refer her back to GI   3. Health maintenance -She has not had colonoscopy since her diagnosis in 01/2014. She is overdue. I will refer her to GI for follow up. -She has also not have mammogram since 03/2018. I will order for her  today.   4. HTN, DM, hypothyroidism, mild neuropathy -She has mild tingling of LE from DM. She rarely uses Gabapentin as needed.  -She is on lipitor, rybelsus, and synthroid. She follows her PCP regularly.     Plan:  -labs and f/u in 3 months -restaging scan in 6 months, will order at next visit -I will refer her back to [place] for routine colonoscopy, she is overdue  -I ordered screening mammogram for her today, she is overdue -continue f/u with PCP     No problem-specific Assessment & Plan notes found for this encounter.   Orders Placed This Encounter  Procedures   MM Digital Screening    Standing Status:   Future    Standing Expiration Date:   08/05/2021    Order Specific Question:   Reason for Exam (SYMPTOM  OR DIAGNOSIS REQUIRED)    Answer:   screening    Order Specific Question:   Preferred imaging location?    Answer:   [facility]   Ferritin    Standing Status:   Future    Standing Expiration Date:   08/05/2021   Vitamin B12    Standing Status:   Future    Standing Expiration Date:   08/05/2021   Iron and TIBC    Standing Status:   Future    Standing Expiration Date:   08/05/2021   Ambulatory referral to Gastroenterology    Referral Priority:   Routine    Referral Type:   Consultation    Referral Reason:   Specialty Services Required    Number of Visits Requested:   1   All questions were answered. The patient knows to call the clinic with any problems, questions or concerns. No barriers to learning was detected. The total time spent in the appointment was 30 minutes.     Truitt Merle, MD 08/05/2020   I, Wilburn Mylar, am acting as scribe for Truitt Merle, MD.   I have reviewed the above documentation for accuracy and completeness, and I agree with the above.

## 2020-08-05 ENCOUNTER — Inpatient Hospital Stay: Payer: Medicare HMO | Attending: Hematology

## 2020-08-05 ENCOUNTER — Inpatient Hospital Stay (HOSPITAL_BASED_OUTPATIENT_CLINIC_OR_DEPARTMENT_OTHER): Payer: Medicare HMO | Admitting: Hematology

## 2020-08-05 ENCOUNTER — Encounter: Payer: Self-pay | Admitting: Hematology

## 2020-08-05 ENCOUNTER — Other Ambulatory Visit: Payer: Self-pay

## 2020-08-05 DIAGNOSIS — E785 Hyperlipidemia, unspecified: Secondary | ICD-10-CM | POA: Insufficient documentation

## 2020-08-05 DIAGNOSIS — I10 Essential (primary) hypertension: Secondary | ICD-10-CM | POA: Diagnosis not present

## 2020-08-05 DIAGNOSIS — K5730 Diverticulosis of large intestine without perforation or abscess without bleeding: Secondary | ICD-10-CM | POA: Diagnosis not present

## 2020-08-05 DIAGNOSIS — D3A8 Other benign neuroendocrine tumors: Secondary | ICD-10-CM | POA: Insufficient documentation

## 2020-08-05 DIAGNOSIS — G629 Polyneuropathy, unspecified: Secondary | ICD-10-CM | POA: Insufficient documentation

## 2020-08-05 DIAGNOSIS — Z880 Allergy status to penicillin: Secondary | ICD-10-CM | POA: Insufficient documentation

## 2020-08-05 DIAGNOSIS — Z8719 Personal history of other diseases of the digestive system: Secondary | ICD-10-CM | POA: Insufficient documentation

## 2020-08-05 DIAGNOSIS — E119 Type 2 diabetes mellitus without complications: Secondary | ICD-10-CM | POA: Diagnosis not present

## 2020-08-05 DIAGNOSIS — J45909 Unspecified asthma, uncomplicated: Secondary | ICD-10-CM | POA: Diagnosis not present

## 2020-08-05 DIAGNOSIS — Z9049 Acquired absence of other specified parts of digestive tract: Secondary | ICD-10-CM | POA: Diagnosis not present

## 2020-08-05 DIAGNOSIS — C7A012 Malignant carcinoid tumor of the ileum: Secondary | ICD-10-CM | POA: Diagnosis not present

## 2020-08-05 DIAGNOSIS — R911 Solitary pulmonary nodule: Secondary | ICD-10-CM | POA: Diagnosis not present

## 2020-08-05 DIAGNOSIS — M2550 Pain in unspecified joint: Secondary | ICD-10-CM | POA: Diagnosis not present

## 2020-08-05 DIAGNOSIS — C7B04 Secondary carcinoid tumors of peritoneum: Secondary | ICD-10-CM | POA: Insufficient documentation

## 2020-08-05 DIAGNOSIS — I700 Atherosclerosis of aorta: Secondary | ICD-10-CM | POA: Insufficient documentation

## 2020-08-05 DIAGNOSIS — K769 Liver disease, unspecified: Secondary | ICD-10-CM | POA: Diagnosis not present

## 2020-08-05 DIAGNOSIS — K56609 Unspecified intestinal obstruction, unspecified as to partial versus complete obstruction: Secondary | ICD-10-CM | POA: Insufficient documentation

## 2020-08-05 DIAGNOSIS — C7B8 Other secondary neuroendocrine tumors: Secondary | ICD-10-CM | POA: Diagnosis not present

## 2020-08-05 DIAGNOSIS — R232 Flushing: Secondary | ICD-10-CM | POA: Diagnosis not present

## 2020-08-05 DIAGNOSIS — E039 Hypothyroidism, unspecified: Secondary | ICD-10-CM | POA: Insufficient documentation

## 2020-08-05 DIAGNOSIS — Z1231 Encounter for screening mammogram for malignant neoplasm of breast: Secondary | ICD-10-CM | POA: Diagnosis not present

## 2020-08-05 DIAGNOSIS — D649 Anemia, unspecified: Secondary | ICD-10-CM | POA: Diagnosis not present

## 2020-08-05 DIAGNOSIS — Z79899 Other long term (current) drug therapy: Secondary | ICD-10-CM | POA: Diagnosis not present

## 2020-08-05 LAB — CBC WITH DIFFERENTIAL (CANCER CENTER ONLY)
Abs Immature Granulocytes: 0.02 10*3/uL (ref 0.00–0.07)
Basophils Absolute: 0.1 10*3/uL (ref 0.0–0.1)
Basophils Relative: 1 %
Eosinophils Absolute: 0.2 10*3/uL (ref 0.0–0.5)
Eosinophils Relative: 3 %
HCT: 33.8 % — ABNORMAL LOW (ref 36.0–46.0)
Hemoglobin: 10.7 g/dL — ABNORMAL LOW (ref 12.0–15.0)
Immature Granulocytes: 0 %
Lymphocytes Relative: 32 %
Lymphs Abs: 2.4 10*3/uL (ref 0.7–4.0)
MCH: 28.5 pg (ref 26.0–34.0)
MCHC: 31.7 g/dL (ref 30.0–36.0)
MCV: 90.1 fL (ref 80.0–100.0)
Monocytes Absolute: 0.5 10*3/uL (ref 0.1–1.0)
Monocytes Relative: 6 %
Neutro Abs: 4.5 10*3/uL (ref 1.7–7.7)
Neutrophils Relative %: 58 %
Platelet Count: 216 10*3/uL (ref 150–400)
RBC: 3.75 MIL/uL — ABNORMAL LOW (ref 3.87–5.11)
RDW: 12.9 % (ref 11.5–15.5)
WBC Count: 7.6 10*3/uL (ref 4.0–10.5)
nRBC: 0 % (ref 0.0–0.2)

## 2020-08-05 LAB — CMP (CANCER CENTER ONLY)
ALT: 20 U/L (ref 0–44)
AST: 18 U/L (ref 15–41)
Albumin: 3.4 g/dL — ABNORMAL LOW (ref 3.5–5.0)
Alkaline Phosphatase: 150 U/L — ABNORMAL HIGH (ref 38–126)
Anion gap: 9 (ref 5–15)
BUN: 22 mg/dL (ref 8–23)
CO2: 25 mmol/L (ref 22–32)
Calcium: 9.3 mg/dL (ref 8.9–10.3)
Chloride: 106 mmol/L (ref 98–111)
Creatinine: 0.83 mg/dL (ref 0.44–1.00)
GFR, Estimated: >60 mL/min (ref >60)
Glucose, Bld: 128 mg/dL — ABNORMAL HIGH (ref 70–99)
Potassium: 4.7 mmol/L (ref 3.5–5.1)
Sodium: 140 mmol/L (ref 135–145)
Total Bilirubin: 0.3 mg/dL (ref 0.3–1.2)
Total Protein: 7 g/dL (ref 6.5–8.1)

## 2020-08-06 LAB — CHROMOGRANIN A: Chromogranin A (ng/mL): 124.6 ng/mL — ABNORMAL HIGH (ref 0.0–101.8)

## 2020-08-10 ENCOUNTER — Telehealth: Payer: Self-pay | Admitting: *Deleted

## 2020-08-10 NOTE — Telephone Encounter (Signed)
Left message for pt to return call tomorrow.

## 2020-08-10 NOTE — Telephone Encounter (Signed)
-----   Message from Truitt Merle, MD sent at 08/10/2020  3:34 PM EDT ----- Please let pt know her lab results, tumor maker is trending up (she is not on treatment) but overall still low level, I suggest prenatal MVI for her anemia, please order ferritin and iron+TIBC for next lab, thanks   Truitt Merle  08/10/2020

## 2020-08-12 NOTE — Telephone Encounter (Signed)
I spoke with Ms Angert and relayed Dr Ernestina Penna comments and recommendations.  She verbalized understanding.

## 2020-08-18 ENCOUNTER — Telehealth: Payer: Self-pay | Admitting: Internal Medicine

## 2020-08-18 NOTE — Chronic Care Management (AMB) (Signed)
  Chronic Care Management   Outreach Note  08/18/2020 Name: Romina Divirgilio MRN: [id number] DOB: Sep 03, 1945  Referred by: Binnie Rail, MD Reason for referral : No chief complaint on file.   Third unsuccessful telephone outreach was attempted today. The patient was referred to the pharmacist for assistance with care management and care coordination.   Follow Up Plan:   SIGNATURE

## 2020-08-26 ENCOUNTER — Telehealth: Payer: Self-pay | Admitting: Internal Medicine

## 2020-08-26 NOTE — Chronic Care Management (AMB) (Signed)
  Chronic Care Management   Outreach Note  08/26/2020 Name: Rhealyn Cullen MRN: [id number] DOB: 01/17/1946  Referred by: Binnie Rail, MD Reason for referral : No chief complaint on file.   Third unsuccessful telephone outreach was attempted today. The patient was referred to the pharmacist for assistance with care management and care coordination.   Follow Up Plan:   Lauretta Grill Upstream Scheduler

## 2020-09-29 ENCOUNTER — Other Ambulatory Visit: Payer: Self-pay | Admitting: Internal Medicine

## 2020-10-02 ENCOUNTER — Other Ambulatory Visit: Payer: Self-pay | Admitting: Internal Medicine

## 2020-10-25 NOTE — Patient Instructions (Addendum)
  Blood work was ordered.     Medications changes include :   zpak for your cough  Your prescription(s) have been submitted to your pharmacy. Please take as directed and contact our office if you believe you are having problem(s) with the medication(s).    Please followup in 6 months

## 2020-10-25 NOTE — Progress Notes (Signed)
Subjective:    Patient ID: Jessica Farrell, female    DOB: [birth year], 75 y.o.   MRN: [id number]  HPI The patient is here for follow up of their chronic medical problems, including DM, hichol, hypothyroid, asthma  We increased her advair dose in June.  She thinks it helped.  She has her chronic cough.  She has had a cough for a while-it is productive of white, sticky mucus.  She notes some sinus pressure on the right, runny nose and occasional wheeze    Medications and allergies reviewed with patient and updated if appropriate.  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 3 each 8   atorvastatin (LIPITOR) 10 MG tablet TAKE 1 TABLET (10 MG TOTAL) BY MOUTH DAILY. 90 tablet 1   cetirizine (ZYRTEC) 10 MG tablet Take 1 tablet (10 mg total) by mouth daily. 15 tablet 0   fluticasone-salmeterol (ADVAIR DISKUS) 250-50 MCG/ACT AEPB Inhale 1 puff into the lungs in the morning and at bedtime. 60 each 5   gabapentin (NEURONTIN) 100 MG capsule Take 1 capsule (100 mg total) by mouth at bedtime. 90 capsule 1   glucose blood test strip Based on insurance preference. Use as instructed once daily to check sugars. Dx E11.9 100 each  12   Lancets (ONETOUCH ULTRASOFT) lancets Use as instructed once daily to check sugars. Dx E11.9 100 each 12   levothyroxine (SYNTHROID) 137 MCG tablet Take 1 tablet daily p.o. before breakfast 6 days a week.  Take 2 tablets daily p.o. before breakfast 1 day a week. 102 tablet 1   loperamide (IMODIUM) 1 MG/5ML solution Take by mouth as needed for diarrhea or loose stools.     metFORMIN (GLUCOPHAGE-XR) 750 MG 24 hr tablet TAKE 2 TABLETS EVERY DAY WITH SUPPER (NEW DOSE) 180 tablet 1   montelukast (SINGULAIR) 10 MG tablet TAKE 1 TABLET (10 MG TOTAL) BY MOUTH AT BEDTIME. 90 tablet 1   naproxen (NAPROSYN) 375 MG tablet Take 1 tablet (375 mg total) by mouth 2 (two) times daily. 20 tablet 0   Prenatal Vit-Fe Fumarate-FA (PRENATAL VITAMINS PO) Take by mouth.     RYBELSUS 3 MG TABS TAKE 1 TABLET BY MOUTH ONCE DAILY BEFORE BREAKFAST 30 tablet 0   No current facility-administered medications on file prior to visit.    Past Medical History:  Diagnosis Date   Asthma    Cellulitis March  2014   Left foot and ankle   colon ca dx'd 01/2014   Colon polyps    2015    Diabetes mellitus ([place]) 05/05/2012   Type II   Hyperlipidemia    Morbid obesity ([place])    Thyroid disease 1990's   HypoThyroidism    Past Surgical History:  Procedure Laterality Date  CHOLECYSTECTOMY N/A 05/02/2017   Procedure: LAPAROSCOPIC CHOLECYSTECTOMY WITH INTRAOPERATIVE CHOLANGIOGRAM, VENTRAL HERNIA REPAIR;  Surgeon: Jovita Kussmaul, MD;  Location: [facility];  Service: General;  Laterality: N/A;   COLON RESECTION N/A 02/03/2014   Procedure: LAPAROSCOPIC ASSISTED BOWEL RESECTION;  Surgeon: Jackolyn Confer, MD;  Location: [facility];  Service: General;  Laterality: N/A;   FRACTURE SURGERY  2000   Ankle   TOOTH EXTRACTION     wisdom Teeth    Social History   Socioeconomic History   Marital status: Divorced    Spouse name: Not on file   Number of children: Not on file   Years of education: Not on file   Highest education level: Not  on file  Occupational History   Not on file  Tobacco Use   Smoking status: Never   Smokeless tobacco: Never  Vaping Use   Vaping Use: Never used  Substance and Sexual Activity   Alcohol use: No   Drug use: No   Sexual activity: Not Currently    Birth control/protection: Post-menopausal  Other Topics Concern   Not on file  Social History Narrative   Not on file   Social Determinants of Health   Financial Resource Strain: Not on file  Food Insecurity: Not on file  Transportation Needs: Not on file  Physical Activity: Not on file  Stress: Not on file  Social Connections: Not on file    Family History  Problem Relation Age of Onset   Alzheimer's disease Father    Heart disease Mother    Arthritis Mother    Hypertension Mother    Alzheimer's disease Sister    Cancer Sister 38       breast cancer    Heart disease Brother        Heart Disease before age 95   Breast cancer Sister    Colon cancer Neg Hx    Esophageal cancer Neg Hx    Rectal cancer Neg Hx    Stomach cancer Neg Hx     Review of Systems  Constitutional:  Negative for fever.  HENT:  Positive for rhinorrhea and sinus pressure (right side).   Respiratory:  Positive for cough (productive of white sticky mucus) and wheezing (occ). Negative for shortness of breath.   Cardiovascular:  Negative for chest pain, palpitations and leg swelling.  Neurological:  Positive for headaches (occ). Negative for light-headedness.      Objective:   Vitals:   10/26/20 0942  BP: 114/70  Pulse: 80  Temp: 97.8 F (36.6 C)  SpO2: 96%   BP Readings from Last 3 Encounters:  10/26/20 114/70  07/26/20 102/70  05/06/20 123/74   Wt Readings from Last 3 Encounters:  10/26/20 167 lb 9.6 oz (76 kg)  07/26/20 179 lb (81.2 kg)  05/06/20 175 lb 8 oz (79.6 kg)   Body mass index is 27.89 kg/m.   Physical Exam    Constitutional: Appears well-developed and well-nourished. No distress.  HENT:  Head: Normocephalic and  atraumatic.  Neck: Neck supple. No tracheal deviation present. No thyromegaly present.  No cervical lymphadenopathy Cardiovascular: Normal rate, regular rhythm and normal heart sounds.   No murmur heard. No carotid bruit .  No edema Pulmonary/Chest: Effort normal and breath sounds normal. No respiratory distress. No has no wheezes. No rales.  Skin: Skin is warm and dry. Not diaphoretic.  Psychiatric: Normal mood and affect. Behavior is normal.      Assessment & Plan:   Flu vaccine today  See Problem List for Assessment and Plan of chronic medical problems.    This visit occurred during the SARS-CoV-2 public health emergency.  Safety protocols were in place, including screening questions prior to the visit, additional usage of staff PPE, and extensive cleaning of exam room while observing appropriate contact time as indicated for disinfecting solutions.

## 2020-10-26 ENCOUNTER — Other Ambulatory Visit: Payer: Self-pay

## 2020-10-26 ENCOUNTER — Encounter: Payer: Self-pay | Admitting: Internal Medicine

## 2020-10-26 ENCOUNTER — Ambulatory Visit (INDEPENDENT_AMBULATORY_CARE_PROVIDER_SITE_OTHER): Payer: Medicare HMO | Admitting: Internal Medicine

## 2020-10-26 VITALS — BP 114/70 | HR 80 | Temp 97.8°F | Ht 65.0 in | Wt 167.6 lb

## 2020-10-26 DIAGNOSIS — D649 Anemia, unspecified: Secondary | ICD-10-CM

## 2020-10-26 DIAGNOSIS — E1165 Type 2 diabetes mellitus with hyperglycemia: Secondary | ICD-10-CM

## 2020-10-26 DIAGNOSIS — R059 Cough, unspecified: Secondary | ICD-10-CM

## 2020-10-26 DIAGNOSIS — E7849 Other hyperlipidemia: Secondary | ICD-10-CM | POA: Diagnosis not present

## 2020-10-26 DIAGNOSIS — E038 Other specified hypothyroidism: Secondary | ICD-10-CM

## 2020-10-26 DIAGNOSIS — Z23 Encounter for immunization: Secondary | ICD-10-CM

## 2020-10-26 DIAGNOSIS — J4520 Mild intermittent asthma, uncomplicated: Secondary | ICD-10-CM | POA: Diagnosis not present

## 2020-10-26 LAB — CBC WITH DIFFERENTIAL/PLATELET
Basophils Absolute: 0 10*3/uL (ref 0.0–0.1)
Basophils Relative: 0.5 % (ref 0.0–3.0)
Eosinophils Absolute: 0.2 10*3/uL (ref 0.0–0.7)
Eosinophils Relative: 2.8 % (ref 0.0–5.0)
HCT: 37.3 % (ref 36.0–46.0)
Hemoglobin: 12.1 g/dL (ref 12.0–15.0)
Lymphocytes Relative: 31.3 % (ref 12.0–46.0)
Lymphs Abs: 2.2 10*3/uL (ref 0.7–4.0)
MCHC: 32.4 g/dL (ref 30.0–36.0)
MCV: 86 fl (ref 78.0–100.0)
Monocytes Absolute: 0.5 10*3/uL (ref 0.1–1.0)
Monocytes Relative: 7.2 % (ref 3.0–12.0)
Neutro Abs: 4.1 10*3/uL (ref 1.4–7.7)
Neutrophils Relative %: 58.2 % (ref 43.0–77.0)
Platelets: 208 10*3/uL (ref 150.0–400.0)
RBC: 4.34 Mil/uL (ref 3.87–5.11)
RDW: 13.1 % (ref 11.5–15.5)
WBC: 7.1 10*3/uL (ref 4.0–10.5)

## 2020-10-26 LAB — HEMOGLOBIN A1C: Hgb A1c MFr Bld: 6.9 % — ABNORMAL HIGH (ref 4.6–6.5)

## 2020-10-26 LAB — BASIC METABOLIC PANEL
BUN: 12 mg/dL (ref 6–23)
CO2: 29 mEq/L (ref 19–32)
Calcium: 9.2 mg/dL (ref 8.4–10.5)
Chloride: 105 mEq/L (ref 96–112)
Creatinine, Ser: 0.84 mg/dL (ref 0.40–1.20)
GFR: 67.96 mL/min (ref >60.00)
Glucose, Bld: 103 mg/dL — ABNORMAL HIGH (ref 70–99)
Potassium: 4.2 mEq/L (ref 3.5–5.1)
Sodium: 140 mEq/L (ref 135–145)

## 2020-10-26 LAB — TSH: TSH: 0.3 u[IU]/mL — ABNORMAL LOW (ref 0.35–5.50)

## 2020-10-26 MED ORDER — AZITHROMYCIN 250 MG PO TABS: ORAL_TABLET | ORAL | 0 refills | Status: DC

## 2020-10-26 NOTE — Assessment & Plan Note (Signed)
Chronic Mild, persistent Increasing Advair in June has helped-continue Advair 250/50 twice daily Continue albuterol as needed Continue Singulair 10 mg daily and Zyrtec 10 mg daily

## 2020-10-26 NOTE — Assessment & Plan Note (Signed)
Chronic Continue atorvastatin 10 mg daily Regular exercise and healthy diet encouraged  

## 2020-10-26 NOTE — Assessment & Plan Note (Signed)
Chronic Lab Results  Component Value Date   HGBA1C 7.4 (H) 07/26/2020   Sugars not ideally controlled in June Recheck A1c today-advised her that if her sugars are not better we may need to adjust her medications-most likely will increase Rybelsus stressed low sugar/carbohydrate diet and regular exercise

## 2020-10-26 NOTE — Assessment & Plan Note (Signed)
Chronic  Clinically euthyroid Currently taking levothyroxine 137 mcg 6 days a week, 274 mcg once a week Check tsh  Titrate med dose if needed

## 2020-10-26 NOTE — Assessment & Plan Note (Signed)
Subacute Has had a cough for a while-productive of white, sticky mucus and also has symptoms suggestive of a sinus infection Sinus infection we will give her a Z-Pak to see if that helps Continue Advair twice daily and albuterol as needed

## 2020-10-28 MED ORDER — LEVOTHYROXINE SODIUM 150 MCG PO TABS: 150.0000 ug | ORAL_TABLET | Freq: Every day | ORAL | 3 refills | Status: DC

## 2020-10-28 NOTE — Addendum Note (Signed)
Addended by: Binnie Rail on: 10/28/2020 01:32 PM   Modules accepted: Orders

## 2020-11-01 ENCOUNTER — Telehealth: Payer: Self-pay

## 2020-11-01 NOTE — Telephone Encounter (Signed)
Please advise as the pt has stated she has questions about the thyroid medication that was rx'd to her and would like for Dr. Quay Burow or her nurse to please contact her at [phone].

## 2020-11-01 NOTE — Telephone Encounter (Signed)
I do not think it is related to the thyroid dose change.  She has been on thyroid medication for a very long time so changing the dose, which she is done in the past should not cause the symptoms.  She should have that evaluated by me or someone else if its not going away.

## 2020-11-02 ENCOUNTER — Other Ambulatory Visit: Payer: Self-pay | Admitting: Internal Medicine

## 2020-11-02 NOTE — Telephone Encounter (Signed)
Message left for patient

## 2020-11-04 ENCOUNTER — Inpatient Hospital Stay: Payer: Medicare HMO

## 2020-11-04 ENCOUNTER — Encounter: Payer: Self-pay | Admitting: Hematology

## 2020-11-04 ENCOUNTER — Other Ambulatory Visit: Payer: Self-pay

## 2020-11-04 ENCOUNTER — Inpatient Hospital Stay: Payer: Medicare HMO | Attending: Hematology | Admitting: Hematology

## 2020-11-04 VITALS — BP 117/64 | HR 89 | Temp 98.1°F | Resp 18 | Ht 65.0 in | Wt 166.9 lb

## 2020-11-04 DIAGNOSIS — C7A012 Malignant carcinoid tumor of the ileum: Secondary | ICD-10-CM | POA: Insufficient documentation

## 2020-11-04 DIAGNOSIS — D649 Anemia, unspecified: Secondary | ICD-10-CM

## 2020-11-04 DIAGNOSIS — C7B8 Other secondary neuroendocrine tumors: Secondary | ICD-10-CM

## 2020-11-04 DIAGNOSIS — C7B04 Secondary carcinoid tumors of peritoneum: Secondary | ICD-10-CM | POA: Diagnosis not present

## 2020-11-04 LAB — CBC WITH DIFFERENTIAL (CANCER CENTER ONLY)
Abs Immature Granulocytes: 0.01 10*3/uL (ref 0.00–0.07)
Basophils Absolute: 0 10*3/uL (ref 0.0–0.1)
Basophils Relative: 0 %
Eosinophils Absolute: 0.2 10*3/uL (ref 0.0–0.5)
Eosinophils Relative: 3 %
HCT: 35.4 % — ABNORMAL LOW (ref 36.0–46.0)
Hemoglobin: 11.4 g/dL — ABNORMAL LOW (ref 12.0–15.0)
Immature Granulocytes: 0 %
Lymphocytes Relative: 31 %
Lymphs Abs: 2.1 10*3/uL (ref 0.7–4.0)
MCH: 28.1 pg (ref 26.0–34.0)
MCHC: 32.2 g/dL (ref 30.0–36.0)
MCV: 87.2 fL (ref 80.0–100.0)
Monocytes Absolute: 0.5 10*3/uL (ref 0.1–1.0)
Monocytes Relative: 7 %
Neutro Abs: 4 10*3/uL (ref 1.7–7.7)
Neutrophils Relative %: 59 %
Platelet Count: 225 10*3/uL (ref 150–400)
RBC: 4.06 MIL/uL (ref 3.87–5.11)
RDW: 12.8 % (ref 11.5–15.5)
WBC Count: 6.7 10*3/uL (ref 4.0–10.5)
nRBC: 0 % (ref 0.0–0.2)

## 2020-11-04 LAB — FERRITIN: Ferritin: 62 ng/mL (ref 11–307)

## 2020-11-04 LAB — CMP (CANCER CENTER ONLY)
ALT: 27 U/L (ref 0–44)
AST: 25 U/L (ref 15–41)
Albumin: 3.2 g/dL — ABNORMAL LOW (ref 3.5–5.0)
Alkaline Phosphatase: 155 U/L — ABNORMAL HIGH (ref 38–126)
Anion gap: 8 (ref 5–15)
BUN: 15 mg/dL (ref 8–23)
CO2: 26 mmol/L (ref 22–32)
Calcium: 9.1 mg/dL (ref 8.9–10.3)
Chloride: 107 mmol/L (ref 98–111)
Creatinine: 0.9 mg/dL (ref 0.44–1.00)
GFR, Estimated: >60 mL/min (ref >60)
Glucose, Bld: 209 mg/dL — ABNORMAL HIGH (ref 70–99)
Potassium: 3.9 mmol/L (ref 3.5–5.1)
Sodium: 141 mmol/L (ref 135–145)
Total Bilirubin: 0.3 mg/dL (ref 0.3–1.2)
Total Protein: 6.8 g/dL (ref 6.5–8.1)

## 2020-11-04 LAB — IRON AND TIBC
Iron: 41 ug/dL (ref 41–142)
Saturation Ratios: 16 % — ABNORMAL LOW (ref 21–57)
TIBC: 265 ug/dL (ref 236–444)
UIBC: 224 ug/dL (ref 120–384)

## 2020-11-04 LAB — VITAMIN B12: Vitamin B-12: 145 pg/mL — ABNORMAL LOW (ref 180–914)

## 2020-11-04 NOTE — Progress Notes (Signed)
[facility]   [phone] [phone]   Clinic Follow up Note   Patient Care Team: Binnie Rail, MD as PCP - General (Internal Medicine) Truitt Merle, MD as Consulting Physician (Hematology) Jackolyn Confer, MD as Consulting Physician (General Surgery)  Date of Service:  11/04/2020  CHIEF COMPLAINT: f/u of metastatic carcinoid tumor  CURRENT THERAPY:  First-line Monthly Sandostatin injection starting 11/06/19. Held since 01/05/20 due to poor toleration.  ASSESSMENT & PLAN:  Jessica Farrell is a 75 y.o. female with   1. Well differentiated low-grade neuroendocrine tumor of terminal ileum (carcinoid tumor), pT3 N1 M0, stage IIIB, peritoneal metastasis 09/2019, probable lung and mediastinal nodules metastasis, indeterminate liver lesions, overall low tumor burden  -She was initially diagnosed in 01/2014. Her tumor has been completely resected. Surgical margins were negative. -10/06/19 Dotatate PET showed metastatic disease to peritoneum, abdominal and mediastinal lymph nodes, and the lungs. Indeterminate liver lesions. 10/29/19 peritoneal biopsy confirmed well-differentiated neuroendocrine tumor. -she started First-line Sandostatin injections monthly on 11/06/19. She tolerated poorly and developed hyperglycemia, fatigue, and the episode of syncope and fall right after the injection. This was discontinued after 3 doses. -CT CAP from 05/03/20 which shows stable disease in the lungs and peritoneum, no other new lesions -She is clinically stable and exam is unremarkable today. -We again discussed starting treatment if she becomes more symptomatic or the scan shows significant progression. -Follow-up in 3 months with restaging CT CAP several days before.   2. Mild anemia -Hgb of 10.7 noted on 08/05/20. This is new. -she switched to prenatal vitamins after that visit. -she is overdue for colonoscopy. I previously referred her back to GI.   3. Health  maintenance -She has not had colonoscopy since her diagnosis in 01/2014. She is overdue. I will refer her to GI for follow up. -She has also not have mammogram since 03/2018. I ordered for her in 07/2020.   4. HTN, DM, hypothyroidism, mild neuropathy -She has mild tingling of LE from DM. She rarely uses Gabapentin as needed.  -She is on lipitor, rybelsus, and synthroid. She follows her PCP regularly.     Plan:  -labs and f/u in 3 months, with CT CAP several days before.   No problem-specific Assessment & Plan notes found for this encounter.   SUMMARY OF ONCOLOGIC HISTORY: Oncology History Overview Note  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   Staging form: Colon and Rectum, AJCC 7th Edition     Clinical: T3, N2, M0 - Unsigned     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 ([place])  01/21/2014 Imaging   CT: Distal small bowel obstruction due to 3.4 cm soft tissue mass near the ileocecal valve, with adjacent right lower quadrant mesenteric lymphadenopathy   02/03/2014 Pathologic Stage   invasive well diff neuroendocrine tumor (carcinoid), 3cm, pT3pN2 (5/25 nodes positive). negative margines.   02/03/2014 Surgery   Laparoscopic-assisted right colectomy and resection of distal ileum   02/03/2014 Initial Diagnosis   Metastatic malignant neuroendocrine tumor of the ileocecal valve to regional lymph nodes.    05/31/2015 Imaging   CT A/P w contrast  IMPRESSION: 1. No evidence of metastatic disease. 2. Question mild basilar subpleural pulmonary fibrosis.   09/11/2019 Imaging   CT AP W contrast    IMPRESSION: 1. Redemonstrated postoperative findings of ileocolectomy and reanastomosis. 2. There are prominent lymph nodes in the right mesocolon which are slightly increased in size compared to prior examination, the largest node measuring 1.8 x 0.7 cm,  previously 1.5 x 0.4 cm. Findings are nonspecific although concerning for nodal metastatic disease. 3.  There are additional new small nodules of the transverse mesocolon or omentum measuring up to 9 mm, likewise concerning for nodal or omental metastatic disease. 4. Redemonstrated broad-based midline, fat containing ventral hernia components. 5. Fluid in the endometrial cavity, abnormal in the late postmenopausal setting although unchanged compared to prior examination. 6. Status post interval cholecystectomy. 7. Aortic Atherosclerosis (ICD10-I70.0).   10/06/2019 PET scan   IMPRESSION: 1. Several small nodules in the peritoneal space and retroperitoneal space with intense radiotracer activity consistent with metastatic well differentiated neuroendocrine tumor. 2. Several small liver lesions above background activity are indeterminate. Consider MRI liver with and without contrast. 3. Radiotracer activity within mediastinal lymph nodes and bilateral pulmonary nodules consistent with metastatic well-differentiated neuroendocrine tumor to the lungs. Activity is very low within these lesions but measurable. 4. No clear evidence skeletal metastasis. Single lesion in the posterior LEFT SI joint warrants attention on follow-up. 5. Although there are multiple sites of metastatic disease, the overall tumor burden of metastatic disease very small.   10/28/2019 Relapse/Recurrence   FINAL MICROSCOPIC DIAGNOSIS:   A. PERITONEAL NODULES, BIOPSY:  -  Well-differentiated neuroendocrine tumor (carcinoid)  -  See comment   COMMENT:   By immunohistochemistry, the neoplastic cells are positive for CD56,  chromogranin and synaptophysin with a low proliferative rate by Ki-67  (less than 1%).  These findings are consistent with metastasis of the  patient's previously diagnosed small bowel carcinoid.  These findings  were discussed with Dr. Blake Divine on October 30, 2019.    11/06/2019 -  Chemotherapy   First-line Monthly Sandostatin injection starting 11/06/19. Held since 01/05/20 due to poor toleration (  hypoglycemia, fatigue, and the episode of syncope and fall right after the injection. The diarrhea and flushing has, but overall symptoms are mild)    05/03/2020 Imaging   CT CAP  IMPRESSION: 1. Stable exam. No new or progressive interval findings. 2. Scattered tiny lung nodules are stable since PET-CT of 10/06/2019. These were hypermetabolic on that exam consistent with metastatic disease. 3. Multiple small omental and mesenteric soft tissue nodules are similar to prior. These were also noted to be hypermetabolic on the PET study. 4. Status post ileocolectomy. 5. Left colonic diverticulosis without diverticulitis. 6. Aortic Atherosclerosis (ICD10-I70.0).        INTERVAL HISTORY:  Milani Lowenstein is here for a follow up of metastatic carcinoid tumor. She was last seen by me on 08/05/20. She presents to the clinic alone. She reports recent side and stomach pain, as well as chronic back pain and leg cramps. She had changed medications with her PCP and wondered if this contributed to the pain. She notes her PCP assured her that was not the case. She notes her bowel movements are similar to prior. She also reports flushes that are also stable.  She reports continued productive cough. She reports she was given an antibiotic by her PCP, which she has finished. She does not feel this helped. She relates the cough and mucus production to her sinuses. She notes her nose will stop up on one side, and she will have to blow her nose.   All other systems were reviewed with the patient and are negative.  MEDICAL HISTORY:  Past Medical History:  Diagnosis Date   Asthma    Cellulitis March  2014   Left foot and ankle   colon ca dx'd 01/2014   Colon polyps  2015    Diabetes mellitus ([place]) 05/05/2012   Type II   Hyperlipidemia    Morbid obesity ([place])    Thyroid disease 1990's   HypoThyroidism    SURGICAL HISTORY: Past Surgical History:  Procedure Laterality Date   CHOLECYSTECTOMY N/A  05/02/2017   Procedure: LAPAROSCOPIC CHOLECYSTECTOMY WITH INTRAOPERATIVE CHOLANGIOGRAM, VENTRAL HERNIA REPAIR;  Surgeon: Jovita Kussmaul, MD;  Location: [facility];  Service: General;  Laterality: N/A;   COLON RESECTION N/A 02/03/2014   Procedure: LAPAROSCOPIC ASSISTED BOWEL RESECTION;  Surgeon: Jackolyn Confer, MD;  Location: [facility];  Service: General;  Laterality: N/A;   FRACTURE SURGERY  2000   Ankle   TOOTH EXTRACTION     wisdom Teeth    I have reviewed the social history and family history with the patient and they are unchanged from previous note.  ALLERGIES:  is allergic to penicillins.  MEDICATIONS:  Current Outpatient Medications  Medication Sig Dispense Refill   albuterol (VENTOLIN HFA) 108 (90 Base) MCG/ACT inhaler Inhale 2 puffs into the lungs every 6 (six) hours as needed for wheezing or shortness of breath. 3 each 8   atorvastatin (LIPITOR) 10 MG tablet TAKE 1 TABLET (10 MG TOTAL) BY MOUTH DAILY. 90 tablet 1   azithromycin (ZITHROMAX) 250 MG tablet Take two tabs the first day and then one tab daily for four days 6 tablet 0   cetirizine (ZYRTEC) 10 MG tablet Take 1 tablet (10 mg total) by mouth daily. 15 tablet 0   fluticasone-salmeterol (ADVAIR DISKUS) 250-50 MCG/ACT AEPB Inhale 1 puff into the lungs in the morning and at bedtime. 60 each 5   gabapentin (NEURONTIN) 100 MG capsule Take 1 capsule (100 mg total) by mouth at bedtime. 90 capsule 1   glucose blood test strip Based on insurance preference. Use as instructed once daily to check sugars. Dx E11.9 100 each 12   Lancets (ONETOUCH ULTRASOFT) lancets Use as instructed once daily to check sugars. Dx E11.9 100 each 12   levothyroxine (SYNTHROID) 150 MCG tablet Take 1 tablet (150 mcg total) by mouth daily. 90 tablet 3   loperamide (IMODIUM) 1 MG/5ML solution Take by mouth as needed for diarrhea or loose stools.     metFORMIN (GLUCOPHAGE-XR) 750 MG 24 hr tablet TAKE 2 TABLETS EVERY DAY WITH SUPPER (NEW DOSE) 180 tablet 1    montelukast (SINGULAIR) 10 MG tablet TAKE 1 TABLET (10 MG TOTAL) BY MOUTH AT BEDTIME. 90 tablet 1   naproxen (NAPROSYN) 375 MG tablet Take 1 tablet (375 mg total) by mouth 2 (two) times daily. 20 tablet 0   Prenatal Vit-Fe Fumarate-FA (PRENATAL VITAMINS PO) Take by mouth.     Semaglutide (RYBELSUS) 7 MG TABS Take 7 mg by mouth daily. Increasing dose to 7 mg 30 tablet 3   No current facility-administered medications for this visit.    PHYSICAL EXAMINATION: ECOG PERFORMANCE STATUS: 1 - Symptomatic but completely ambulatory  Vitals:   11/04/20 1132  BP: 117/64  Pulse: 89  Resp: 18  Temp: 98.1 F (36.7 C)  SpO2: 97%   Wt Readings from Last 3 Encounters:  11/04/20 166 lb 14.4 oz (75.7 kg)  10/26/20 167 lb 9.6 oz (76 kg)  07/26/20 179 lb (81.2 kg)     GENERAL:alert, no distress and comfortable SKIN: skin color, texture, turgor are normal, no rashes or significant lesions EYES: normal, Conjunctiva are pink and non-injected, sclera clear  NECK: supple, thyroid normal size, non-tender, without nodularity LYMPH:  no palpable lymphadenopathy in the cervical,  axillary  LUNGS: clear to auscultation and percussion with normal breathing effort HEART: regular rate & rhythm and no murmurs and no lower extremity edema ABDOMEN:abdomen soft, non-tender and normal bowel sounds Musculoskeletal:no cyanosis of digits and no clubbing  NEURO: alert & oriented x 3 with fluent speech, no focal motor/sensory deficits  LABORATORY DATA:  I have reviewed the data as listed CBC Latest Ref Rng & Units 11/04/2020 10/26/2020 08/05/2020  WBC 4.0 - 10.5 K/uL 6.7 7.1 7.6  Hemoglobin 12.0 - 15.0 g/dL 11.4(L) 12.1 10.7(L)  Hematocrit 36.0 - 46.0 % 35.4(L) 37.3 33.8(L)  Platelets 150 - 400 K/uL 225 208.0 216     CMP Latest Ref Rng & Units 11/04/2020 10/26/2020 08/05/2020  Glucose 70 - 99 mg/dL 209(H) 103(H) 128(H)  BUN 8 - 23 mg/dL $Remove'15 12 22  'IYpGEVP$ Creatinine 0.44 - 1.00 mg/dL 0.90 0.84 0.83  Sodium 135 - 145 mmol/L 141  140 140  Potassium 3.5 - 5.1 mmol/L 3.9 4.2 4.7  Chloride 98 - 111 mmol/L 107 105 106  CO2 22 - 32 mmol/L $RemoveB'26 29 25  'bhNZCfqa$ Calcium 8.9 - 10.3 mg/dL 9.1 9.2 9.3  Total Protein 6.5 - 8.1 g/dL 6.8 - 7.0  Total Bilirubin 0.3 - 1.2 mg/dL 0.3 - 0.3  Alkaline Phos 38 - 126 U/L 155(H) - 150(H)  AST 15 - 41 U/L 25 - 18  ALT 0 - 44 U/L 27 - 20      RADIOGRAPHIC STUDIES: I have personally reviewed the radiological images as listed and agreed with the findings in the report. No results found.    Orders Placed This Encounter  Procedures   CT CHEST ABDOMEN PELVIS W CONTRAST    Standing Status:   Future    Standing Expiration Date:   11/04/2021    Order Specific Question:   Preferred imaging location?    Answer:   [facility]    Order Specific Question:   Release to patient    Answer:   Immediate    Order Specific Question:   Is Oral Contrast requested for this exam?    Answer:   Yes, Per Radiology protocol   All questions were answered. The patient knows to call the clinic with any problems, questions or concerns. No barriers to learning was detected. The total time spent in the appointment was 30 minutes.     Truitt Merle, MD 11/04/2020   I, Wilburn Mylar, am acting as scribe for Truitt Merle, MD.   I have reviewed the above documentation for accuracy and completeness, and I agree with the above.

## 2020-11-05 LAB — CHROMOGRANIN A: Chromogranin A (ng/mL): 117.8 ng/mL — ABNORMAL HIGH (ref 0.0–101.8)

## 2020-11-08 ENCOUNTER — Telehealth: Payer: Self-pay

## 2020-11-08 NOTE — Telephone Encounter (Signed)
Called patient to discuss lab results. Per Dr Burr Medico, patient to have weekly B12 injections x8, then monthly until next office visit. Patient verbalized understanding. Scheduling had already called with updated appts.

## 2020-11-11 ENCOUNTER — Inpatient Hospital Stay: Payer: Medicare HMO

## 2020-11-11 ENCOUNTER — Other Ambulatory Visit: Payer: Self-pay | Admitting: Hematology

## 2020-11-11 ENCOUNTER — Other Ambulatory Visit: Payer: Self-pay

## 2020-11-11 VITALS — BP 115/79 | HR 70 | Temp 98.2°F | Resp 16

## 2020-11-11 DIAGNOSIS — C7A012 Malignant carcinoid tumor of the ileum: Secondary | ICD-10-CM | POA: Diagnosis not present

## 2020-11-11 DIAGNOSIS — C7B8 Other secondary neuroendocrine tumors: Secondary | ICD-10-CM

## 2020-11-11 DIAGNOSIS — D649 Anemia, unspecified: Secondary | ICD-10-CM | POA: Diagnosis not present

## 2020-11-11 DIAGNOSIS — C7B04 Secondary carcinoid tumors of peritoneum: Secondary | ICD-10-CM | POA: Diagnosis not present

## 2020-11-11 MED ORDER — OCTREOTIDE ACETATE 30 MG IM KIT
30.0000 mg | PACK | Freq: Once | INTRAMUSCULAR | Status: DC
  Filled 2020-11-11: qty 1

## 2020-11-11 MED ORDER — CYANOCOBALAMIN 1000 MCG/ML IJ SOLN
1000.0000 ug | Freq: Once | INTRAMUSCULAR | Status: AC
  Administered 2020-11-11: 1000 ug via INTRAMUSCULAR
  Filled 2020-11-11: qty 1

## 2020-11-18 ENCOUNTER — Inpatient Hospital Stay: Payer: Medicare HMO

## 2020-11-18 ENCOUNTER — Other Ambulatory Visit: Payer: Self-pay

## 2020-11-18 DIAGNOSIS — C7B8 Other secondary neuroendocrine tumors: Secondary | ICD-10-CM

## 2020-11-18 DIAGNOSIS — C7B04 Secondary carcinoid tumors of peritoneum: Secondary | ICD-10-CM | POA: Diagnosis not present

## 2020-11-18 DIAGNOSIS — C7A012 Malignant carcinoid tumor of the ileum: Secondary | ICD-10-CM | POA: Diagnosis not present

## 2020-11-18 DIAGNOSIS — D649 Anemia, unspecified: Secondary | ICD-10-CM | POA: Diagnosis not present

## 2020-11-18 MED ORDER — CYANOCOBALAMIN 1000 MCG/ML IJ SOLN
1000.0000 ug | Freq: Once | INTRAMUSCULAR | Status: AC
  Administered 2020-11-18: 1000 ug via INTRAMUSCULAR
  Filled 2020-11-18: qty 1

## 2020-11-25 ENCOUNTER — Inpatient Hospital Stay: Payer: Medicare HMO | Attending: Hematology

## 2020-11-25 ENCOUNTER — Other Ambulatory Visit: Payer: Self-pay

## 2020-11-25 DIAGNOSIS — G629 Polyneuropathy, unspecified: Secondary | ICD-10-CM | POA: Insufficient documentation

## 2020-11-25 DIAGNOSIS — C7B8 Other secondary neuroendocrine tumors: Secondary | ICD-10-CM

## 2020-11-25 DIAGNOSIS — C7A012 Malignant carcinoid tumor of the ileum: Secondary | ICD-10-CM | POA: Diagnosis not present

## 2020-11-25 MED ORDER — CYANOCOBALAMIN 1000 MCG/ML IJ SOLN
1000.0000 ug | Freq: Once | INTRAMUSCULAR | Status: AC
  Administered 2020-11-25: 1000 ug via INTRAMUSCULAR
  Filled 2020-11-25: qty 1

## 2020-12-02 ENCOUNTER — Other Ambulatory Visit: Payer: Self-pay

## 2020-12-02 ENCOUNTER — Inpatient Hospital Stay: Payer: Medicare HMO

## 2020-12-02 DIAGNOSIS — G629 Polyneuropathy, unspecified: Secondary | ICD-10-CM | POA: Diagnosis not present

## 2020-12-02 DIAGNOSIS — C7B8 Other secondary neuroendocrine tumors: Secondary | ICD-10-CM

## 2020-12-02 DIAGNOSIS — C7A012 Malignant carcinoid tumor of the ileum: Secondary | ICD-10-CM | POA: Diagnosis not present

## 2020-12-02 MED ORDER — CYANOCOBALAMIN 1000 MCG/ML IJ SOLN
1000.0000 ug | Freq: Once | INTRAMUSCULAR | Status: AC
  Administered 2020-12-02: 1000 ug via INTRAMUSCULAR
  Filled 2020-12-02: qty 1

## 2020-12-02 NOTE — Patient Instructions (Signed)
Vitamin B12 Injection What is this medication? Vitamin B12 (VAHY tuh min B12) prevents and treats low vitamin B12 levels in your body. It is used in people who do not get enough vitamin B12 from their diet or when their digestive tract does not absorb enough. Vitamin B12 plays an important role in maintaining the health of your nervous system and red blood cells. This medicine may be used for other purposes; ask your health care provider or pharmacist if you have questions. COMMON BRAND NAME(S): B-12 Compliance Kit, B-12 Injection Kit, Cyomin, Dodex, LA-12, Nutri-Twelve, Physicians EZ Use B-12, Primabalt What should I tell my care team before I take this medication? They need to know if you have any of these conditions: Kidney disease Leber's disease Megaloblastic anemia An unusual or allergic reaction to cyanocobalamin, cobalt, other medications, foods, dyes, or preservatives Pregnant or trying to get pregnant Breast-feeding How should I use this medication? This medication is injected into a muscle or deeply under the skin. It is usually given in a clinic or care team's office. However, your care team may teach you how to inject yourself. Follow all instructions. Talk to your care team about the use of this medication in children. Special care may be needed. Overdosage: If you think you have taken too much of this medicine contact a poison control center or emergency room at once. NOTE: This medicine is only for you. Do not share this medicine with others. What if I miss a dose? If you are given your dose at a clinic or care team's office, call to reschedule your appointment. If you give your own injections, and you miss a dose, take it as soon as you can. If it is almost time for your next dose, take only that dose. Do not take double or extra doses. What may interact with this medication? Colchicine Heavy alcohol intake This list may not describe all possible interactions. Give your health  care provider a list of all the medicines, herbs, non-prescription drugs, or dietary supplements you use. Also tell them if you smoke, drink alcohol, or use illegal drugs. Some items may interact with your medicine. What should I watch for while using this medication? Visit your care team regularly. You may need blood work done while you are taking this medication. You may need to follow a special diet. Talk to your care team. Limit your alcohol intake and avoid smoking to get the best benefit. What side effects may I notice from receiving this medication? Side effects that you should report to your care team as soon as possible: Allergic reactions-skin rash, itching, hives, swelling of the face, lips, tongue, or throat Swelling of the ankles, hands, or feet Trouble breathing Side effects that usually do not require medical attention (report to your care team if they continue or are bothersome): Diarrhea This list may not describe all possible side effects. Call your doctor for medical advice about side effects. You may report side effects to FDA at 1-800-FDA-1088. Where should I keep my medication? Keep out of the reach of children. Store at room temperature between 15 and 30 degrees C (59 and 85 degrees F). Protect from light. Throw away any unused medication after the expiration date. NOTE: This sheet is a summary. It may not cover all possible information. If you have questions about this medicine, talk to your doctor, pharmacist, or health care provider.  2022 Elsevier/Gold Standard (2020-03-29 11:47:06)

## 2020-12-09 ENCOUNTER — Inpatient Hospital Stay: Payer: Medicare HMO

## 2020-12-09 ENCOUNTER — Other Ambulatory Visit: Payer: Self-pay

## 2020-12-09 DIAGNOSIS — G629 Polyneuropathy, unspecified: Secondary | ICD-10-CM | POA: Diagnosis not present

## 2020-12-09 DIAGNOSIS — C7B8 Other secondary neuroendocrine tumors: Secondary | ICD-10-CM

## 2020-12-09 DIAGNOSIS — C7A012 Malignant carcinoid tumor of the ileum: Secondary | ICD-10-CM | POA: Diagnosis not present

## 2020-12-09 MED ORDER — CYANOCOBALAMIN 1000 MCG/ML IJ SOLN
1000.0000 ug | Freq: Once | INTRAMUSCULAR | Status: AC
  Administered 2020-12-09: 1000 ug via INTRAMUSCULAR
  Filled 2020-12-09: qty 1

## 2020-12-16 ENCOUNTER — Other Ambulatory Visit: Payer: Self-pay

## 2020-12-16 ENCOUNTER — Inpatient Hospital Stay: Payer: Medicare HMO

## 2020-12-16 DIAGNOSIS — G629 Polyneuropathy, unspecified: Secondary | ICD-10-CM | POA: Diagnosis not present

## 2020-12-16 DIAGNOSIS — C7A012 Malignant carcinoid tumor of the ileum: Secondary | ICD-10-CM | POA: Diagnosis not present

## 2020-12-16 DIAGNOSIS — C7B8 Other secondary neuroendocrine tumors: Secondary | ICD-10-CM

## 2020-12-16 MED ORDER — CYANOCOBALAMIN 1000 MCG/ML IJ SOLN
1000.0000 ug | Freq: Once | INTRAMUSCULAR | Status: AC
  Administered 2020-12-16: 1000 ug via INTRAMUSCULAR
  Filled 2020-12-16: qty 1

## 2020-12-23 ENCOUNTER — Other Ambulatory Visit: Payer: Self-pay

## 2020-12-23 ENCOUNTER — Inpatient Hospital Stay: Payer: Medicare HMO | Attending: Hematology

## 2020-12-23 DIAGNOSIS — C7B8 Other secondary neuroendocrine tumors: Secondary | ICD-10-CM

## 2020-12-23 DIAGNOSIS — C7B01 Secondary carcinoid tumors of distant lymph nodes: Secondary | ICD-10-CM | POA: Insufficient documentation

## 2020-12-23 DIAGNOSIS — C7B04 Secondary carcinoid tumors of peritoneum: Secondary | ICD-10-CM | POA: Insufficient documentation

## 2020-12-23 DIAGNOSIS — C7A012 Malignant carcinoid tumor of the ileum: Secondary | ICD-10-CM | POA: Insufficient documentation

## 2020-12-23 MED ORDER — CYANOCOBALAMIN 1000 MCG/ML IJ SOLN
1000.0000 ug | Freq: Once | INTRAMUSCULAR | Status: AC
  Administered 2020-12-23: 1000 ug via INTRAMUSCULAR
  Filled 2020-12-23: qty 1

## 2020-12-23 NOTE — Patient Instructions (Signed)

## 2020-12-30 ENCOUNTER — Inpatient Hospital Stay: Payer: Medicare HMO

## 2020-12-30 ENCOUNTER — Other Ambulatory Visit: Payer: Self-pay

## 2020-12-30 DIAGNOSIS — C7B04 Secondary carcinoid tumors of peritoneum: Secondary | ICD-10-CM | POA: Diagnosis not present

## 2020-12-30 DIAGNOSIS — C7B8 Other secondary neuroendocrine tumors: Secondary | ICD-10-CM

## 2020-12-30 DIAGNOSIS — C7A012 Malignant carcinoid tumor of the ileum: Secondary | ICD-10-CM | POA: Diagnosis not present

## 2020-12-30 DIAGNOSIS — C7B01 Secondary carcinoid tumors of distant lymph nodes: Secondary | ICD-10-CM | POA: Diagnosis not present

## 2020-12-30 MED ORDER — CYANOCOBALAMIN 1000 MCG/ML IJ SOLN
1000.0000 ug | Freq: Once | INTRAMUSCULAR | Status: AC
  Administered 2020-12-30: 1000 ug via INTRAMUSCULAR
  Filled 2020-12-30: qty 1

## 2021-01-06 ENCOUNTER — Ambulatory Visit: Payer: Medicare HMO

## 2021-01-12 ENCOUNTER — Encounter: Payer: Self-pay | Admitting: Gastroenterology

## 2021-02-01 ENCOUNTER — Inpatient Hospital Stay: Payer: Medicare HMO | Attending: Hematology

## 2021-02-01 ENCOUNTER — Other Ambulatory Visit: Payer: Self-pay

## 2021-02-01 DIAGNOSIS — D649 Anemia, unspecified: Secondary | ICD-10-CM | POA: Diagnosis not present

## 2021-02-01 DIAGNOSIS — C7B04 Secondary carcinoid tumors of peritoneum: Secondary | ICD-10-CM | POA: Insufficient documentation

## 2021-02-01 DIAGNOSIS — C7A012 Malignant carcinoid tumor of the ileum: Secondary | ICD-10-CM | POA: Insufficient documentation

## 2021-02-01 DIAGNOSIS — C7B02 Secondary carcinoid tumors of liver: Secondary | ICD-10-CM | POA: Insufficient documentation

## 2021-02-01 DIAGNOSIS — C7B01 Secondary carcinoid tumors of distant lymph nodes: Secondary | ICD-10-CM | POA: Insufficient documentation

## 2021-02-01 DIAGNOSIS — C7B8 Other secondary neuroendocrine tumors: Secondary | ICD-10-CM

## 2021-02-01 LAB — CMP (CANCER CENTER ONLY)
ALT: 22 U/L (ref 0–44)
AST: 20 U/L (ref 15–41)
Albumin: 3.2 g/dL — ABNORMAL LOW (ref 3.5–5.0)
Alkaline Phosphatase: 140 U/L — ABNORMAL HIGH (ref 38–126)
Anion gap: 8 (ref 5–15)
BUN: 16 mg/dL (ref 8–23)
CO2: 23 mmol/L (ref 22–32)
Calcium: 8.6 mg/dL — ABNORMAL LOW (ref 8.9–10.3)
Chloride: 107 mmol/L (ref 98–111)
Creatinine: 0.85 mg/dL (ref 0.44–1.00)
GFR, Estimated: >60 mL/min (ref >60)
Glucose, Bld: 165 mg/dL — ABNORMAL HIGH (ref 70–99)
Potassium: 4.1 mmol/L (ref 3.5–5.1)
Sodium: 138 mmol/L (ref 135–145)
Total Bilirubin: 0.3 mg/dL (ref 0.3–1.2)
Total Protein: 6.7 g/dL (ref 6.5–8.1)

## 2021-02-01 LAB — CBC WITH DIFFERENTIAL (CANCER CENTER ONLY)
Abs Immature Granulocytes: 0.01 10*3/uL (ref 0.00–0.07)
Basophils Absolute: 0.1 10*3/uL (ref 0.0–0.1)
Basophils Relative: 1 %
Eosinophils Absolute: 0.2 10*3/uL (ref 0.0–0.5)
Eosinophils Relative: 4 %
HCT: 34.1 % — ABNORMAL LOW (ref 36.0–46.0)
Hemoglobin: 11.2 g/dL — ABNORMAL LOW (ref 12.0–15.0)
Immature Granulocytes: 0 %
Lymphocytes Relative: 30 %
Lymphs Abs: 1.8 10*3/uL (ref 0.7–4.0)
MCH: 28.1 pg (ref 26.0–34.0)
MCHC: 32.8 g/dL (ref 30.0–36.0)
MCV: 85.7 fL (ref 80.0–100.0)
Monocytes Absolute: 0.4 10*3/uL (ref 0.1–1.0)
Monocytes Relative: 7 %
Neutro Abs: 3.6 10*3/uL (ref 1.7–7.7)
Neutrophils Relative %: 58 %
Platelet Count: 216 10*3/uL (ref 150–400)
RBC: 3.98 MIL/uL (ref 3.87–5.11)
RDW: 13.4 % (ref 11.5–15.5)
WBC Count: 6.1 10*3/uL (ref 4.0–10.5)
nRBC: 0 % (ref 0.0–0.2)

## 2021-02-02 LAB — CHROMOGRANIN A: Chromogranin A (ng/mL): 122.5 ng/mL — ABNORMAL HIGH (ref 0.0–101.8)

## 2021-02-03 ENCOUNTER — Other Ambulatory Visit: Payer: Self-pay

## 2021-02-03 ENCOUNTER — Ambulatory Visit: Payer: Medicare HMO | Admitting: Hematology

## 2021-02-04 ENCOUNTER — Telehealth: Payer: Self-pay | Admitting: Internal Medicine

## 2021-02-04 NOTE — Telephone Encounter (Signed)
Patient called and appointment made.  Lab order in place from September

## 2021-02-04 NOTE — Telephone Encounter (Signed)
Patient calling in  Says she received vm from Dr. Quay Burow requesting she come in to have thyroid levels checked.. do not see any future lab orders   Please confirm & fu w/ patient once orders have been placed by provider [phone]

## 2021-02-07 ENCOUNTER — Other Ambulatory Visit (INDEPENDENT_AMBULATORY_CARE_PROVIDER_SITE_OTHER): Payer: Medicare HMO

## 2021-02-07 ENCOUNTER — Other Ambulatory Visit: Payer: Self-pay

## 2021-02-07 DIAGNOSIS — E038 Other specified hypothyroidism: Secondary | ICD-10-CM

## 2021-02-07 LAB — TSH: TSH: 1.56 u[IU]/mL (ref 0.35–5.50)

## 2021-02-08 ENCOUNTER — Encounter (HOSPITAL_COMMUNITY): Payer: Self-pay

## 2021-02-08 ENCOUNTER — Inpatient Hospital Stay: Payer: Medicare HMO

## 2021-02-08 ENCOUNTER — Ambulatory Visit (HOSPITAL_COMMUNITY)
Admission: RE | Admit: 2021-02-08 | Discharge: 2021-02-08 | Disposition: A | Discharge status: Home / Self Care | Payer: Medicare HMO | Source: Ambulatory Visit | Attending: Hematology | Admitting: Hematology

## 2021-02-08 DIAGNOSIS — C189 Malignant neoplasm of colon, unspecified: Secondary | ICD-10-CM | POA: Insufficient documentation

## 2021-02-08 DIAGNOSIS — C7B8 Other secondary neuroendocrine tumors: Secondary | ICD-10-CM | POA: Insufficient documentation

## 2021-02-08 DIAGNOSIS — J929 Pleural plaque without asbestos: Secondary | ICD-10-CM | POA: Diagnosis not present

## 2021-02-08 DIAGNOSIS — C7B02 Secondary carcinoid tumors of liver: Secondary | ICD-10-CM | POA: Diagnosis not present

## 2021-02-08 DIAGNOSIS — D649 Anemia, unspecified: Secondary | ICD-10-CM | POA: Diagnosis not present

## 2021-02-08 DIAGNOSIS — I2510 Atherosclerotic heart disease of native coronary artery without angina pectoris: Secondary | ICD-10-CM | POA: Diagnosis not present

## 2021-02-08 DIAGNOSIS — J984 Other disorders of lung: Secondary | ICD-10-CM | POA: Diagnosis not present

## 2021-02-08 DIAGNOSIS — I700 Atherosclerosis of aorta: Secondary | ICD-10-CM | POA: Insufficient documentation

## 2021-02-08 DIAGNOSIS — R918 Other nonspecific abnormal finding of lung field: Secondary | ICD-10-CM | POA: Insufficient documentation

## 2021-02-08 DIAGNOSIS — C7A012 Malignant carcinoid tumor of the ileum: Secondary | ICD-10-CM | POA: Diagnosis not present

## 2021-02-08 DIAGNOSIS — K429 Umbilical hernia without obstruction or gangrene: Secondary | ICD-10-CM | POA: Diagnosis not present

## 2021-02-08 DIAGNOSIS — M47816 Spondylosis without myelopathy or radiculopathy, lumbar region: Secondary | ICD-10-CM | POA: Insufficient documentation

## 2021-02-08 DIAGNOSIS — C7B04 Secondary carcinoid tumors of peritoneum: Secondary | ICD-10-CM | POA: Diagnosis not present

## 2021-02-08 DIAGNOSIS — K439 Ventral hernia without obstruction or gangrene: Secondary | ICD-10-CM | POA: Diagnosis not present

## 2021-02-08 DIAGNOSIS — C7B01 Secondary carcinoid tumors of distant lymph nodes: Secondary | ICD-10-CM | POA: Diagnosis not present

## 2021-02-08 LAB — CBC WITH DIFFERENTIAL (CANCER CENTER ONLY)
Abs Immature Granulocytes: 0.02 10*3/uL (ref 0.00–0.07)
Basophils Absolute: 0.1 10*3/uL (ref 0.0–0.1)
Basophils Relative: 1 %
Eosinophils Absolute: 0.2 10*3/uL (ref 0.0–0.5)
Eosinophils Relative: 3 %
HCT: 36.4 % (ref 36.0–46.0)
Hemoglobin: 11.7 g/dL — ABNORMAL LOW (ref 12.0–15.0)
Immature Granulocytes: 0 %
Lymphocytes Relative: 26 %
Lymphs Abs: 1.8 10*3/uL (ref 0.7–4.0)
MCH: 27.9 pg (ref 26.0–34.0)
MCHC: 32.1 g/dL (ref 30.0–36.0)
MCV: 86.9 fL (ref 80.0–100.0)
Monocytes Absolute: 0.5 10*3/uL (ref 0.1–1.0)
Monocytes Relative: 7 %
Neutro Abs: 4.2 10*3/uL (ref 1.7–7.7)
Neutrophils Relative %: 63 %
Platelet Count: 239 10*3/uL (ref 150–400)
RBC: 4.19 MIL/uL (ref 3.87–5.11)
RDW: 13.4 % (ref 11.5–15.5)
WBC Count: 6.6 10*3/uL (ref 4.0–10.5)
nRBC: 0 % (ref 0.0–0.2)

## 2021-02-08 LAB — CMP (CANCER CENTER ONLY)
ALT: 17 U/L (ref 0–44)
AST: 13 U/L — ABNORMAL LOW (ref 15–41)
Albumin: 3.7 g/dL (ref 3.5–5.0)
Alkaline Phosphatase: 143 U/L — ABNORMAL HIGH (ref 38–126)
Anion gap: 7 (ref 5–15)
BUN: 18 mg/dL (ref 8–23)
CO2: 25 mmol/L (ref 22–32)
Calcium: 9.2 mg/dL (ref 8.9–10.3)
Chloride: 106 mmol/L (ref 98–111)
Creatinine: 0.84 mg/dL (ref 0.44–1.00)
GFR, Estimated: >60 mL/min (ref >60)
Glucose, Bld: 122 mg/dL — ABNORMAL HIGH (ref 70–99)
Potassium: 4.2 mmol/L (ref 3.5–5.1)
Sodium: 138 mmol/L (ref 135–145)
Total Bilirubin: 0.3 mg/dL (ref 0.3–1.2)
Total Protein: 6.9 g/dL (ref 6.5–8.1)

## 2021-02-08 IMAGING — CT CT CHEST-ABD-PELV W/ CM
3 of 5 series · 14 of 36 positions shown, 16 images · IV contrast (OMNIPAQUE)
Comparison: [DATE]

CLINICAL DATA: Colon cancer restaging

EXAM:
CT CHEST, ABDOMEN, AND PELVIS WITH CONTRAST
TECHNIQUE: Multidetector CT imaging of the chest, abdomen and pelvis was
performed following the standard protocol during bolus
administration of intravenous contrast.
CONTRAST:  80mL OMNIPAQUE IOHEXOL 350 MG/ML SOLN

[Series 2: cap with · axial · 0.91mm/px · z∈[+989,+1504]mm · 9 of 129 slices shown, 11 images]
[im 13/129  mediastinal]
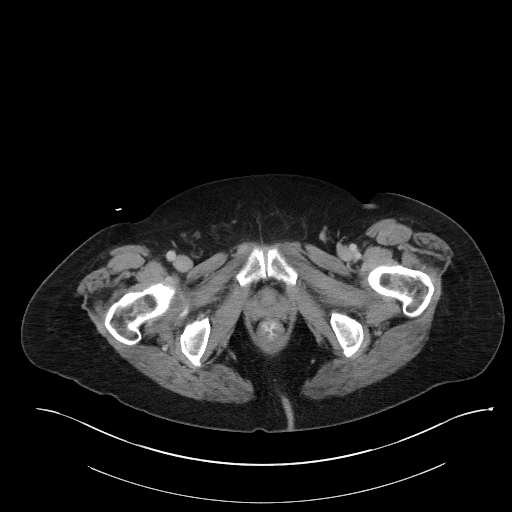
[im 13/129  bone]
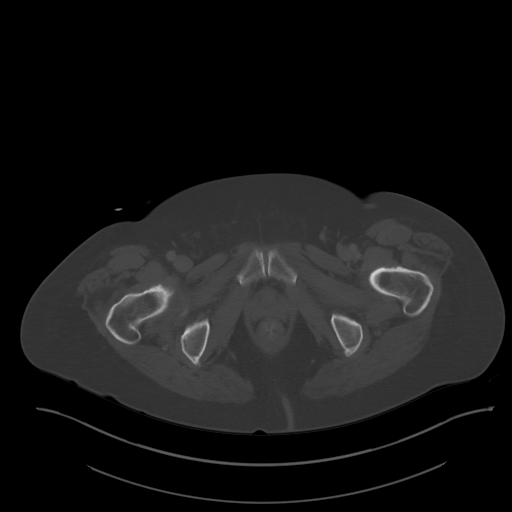
[im 26/129  mediastinal]
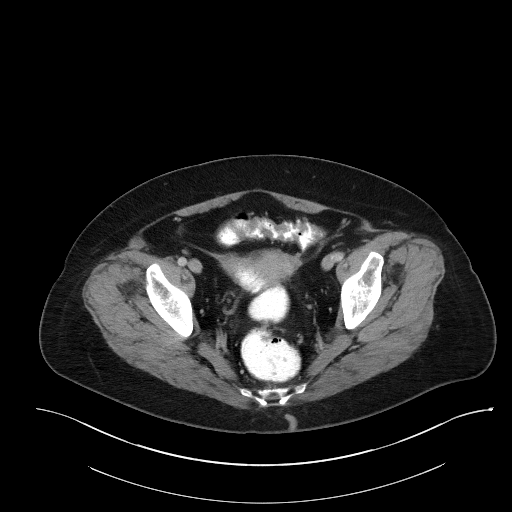
[im 39/129  mediastinal]
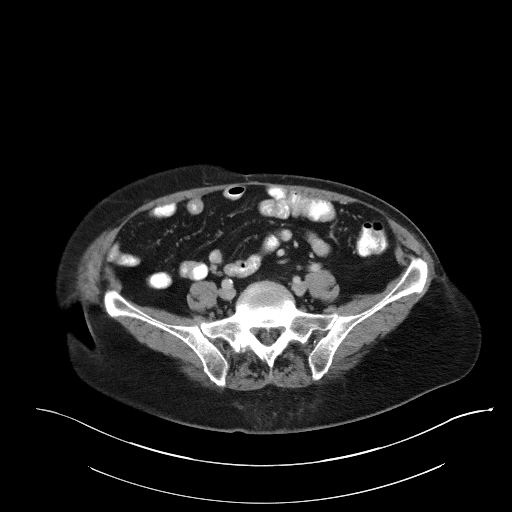
[im 52/129  mediastinal]
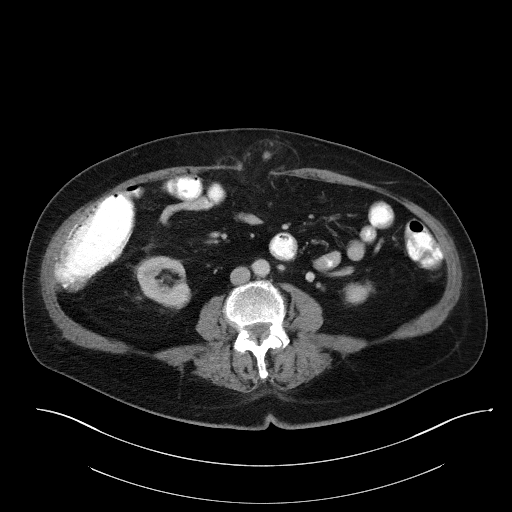
[im 65/129  mediastinal]
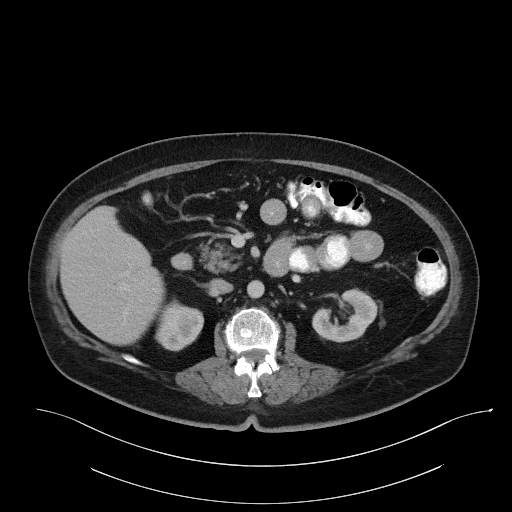
[im 77/129  mediastinal]
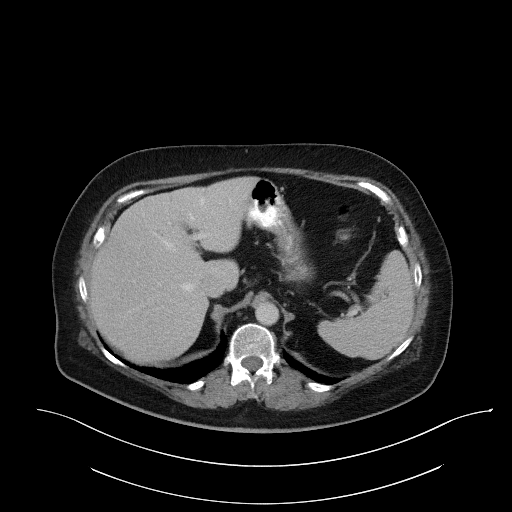
[im 90/129  mediastinal]
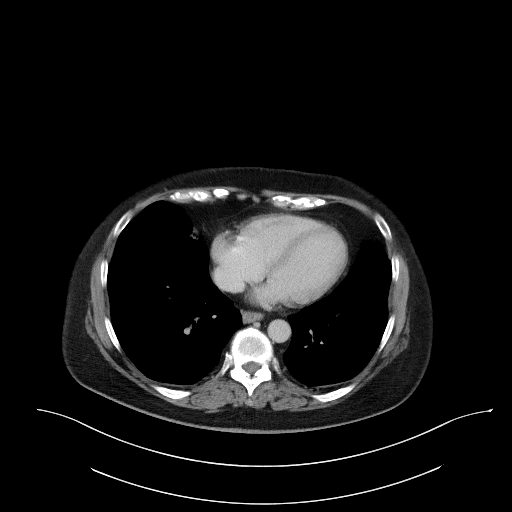
[im 103/129  mediastinal]
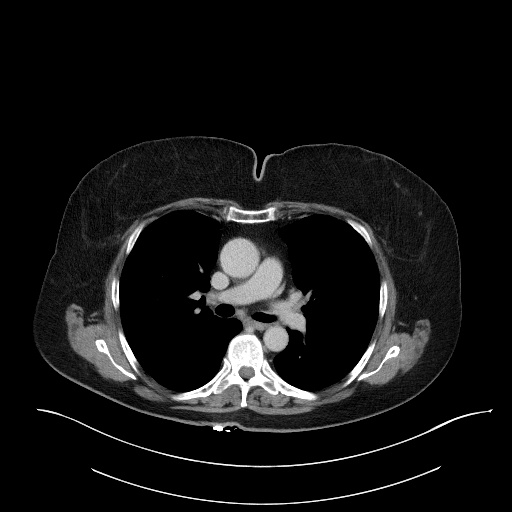
[im 116/129  mediastinal]
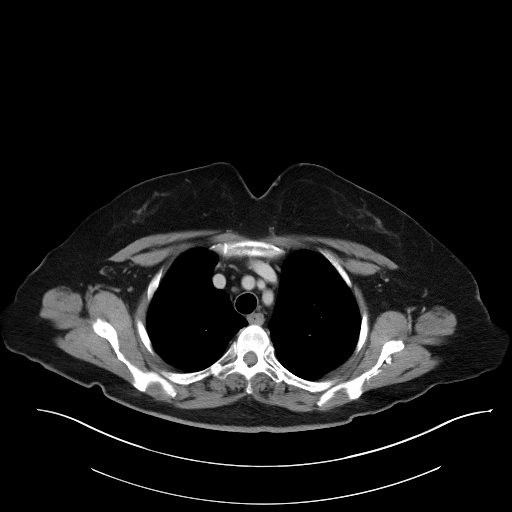
[im 116/129  bone]
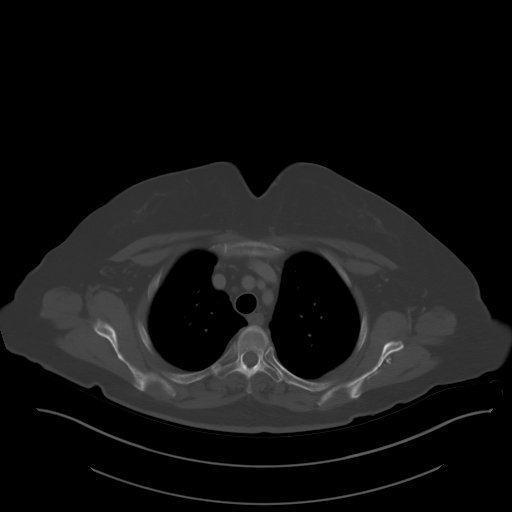

[Series 4: lung · axial · 0.91mm/px · z∈[+1281,+1329]mm · 2 of 157 slices shown]
[im 13/157  bone]
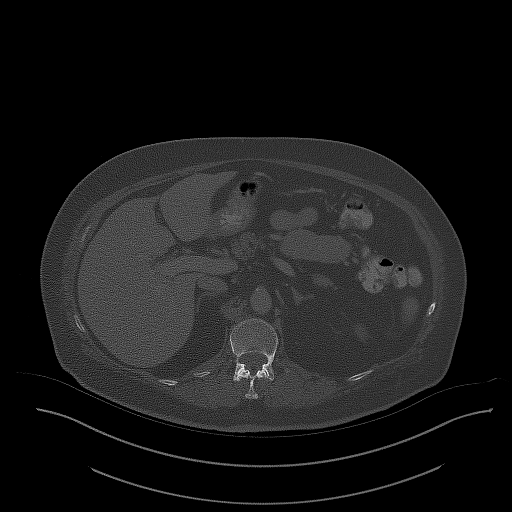
[im 37/157  bone]
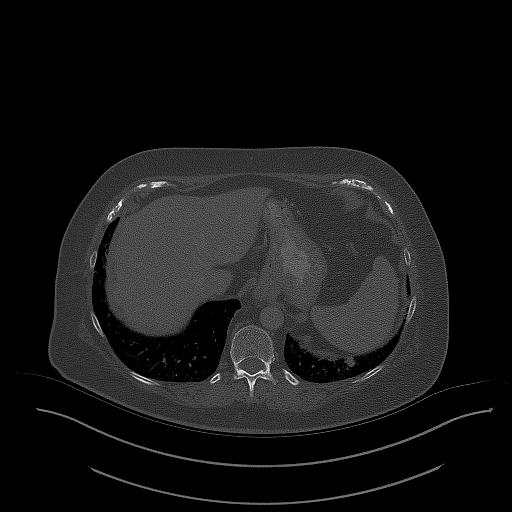

[Series 5: coronals · coronal · 0.87mm/px · 3 of 146 slices shown]
[im 30/146  mediastinal]
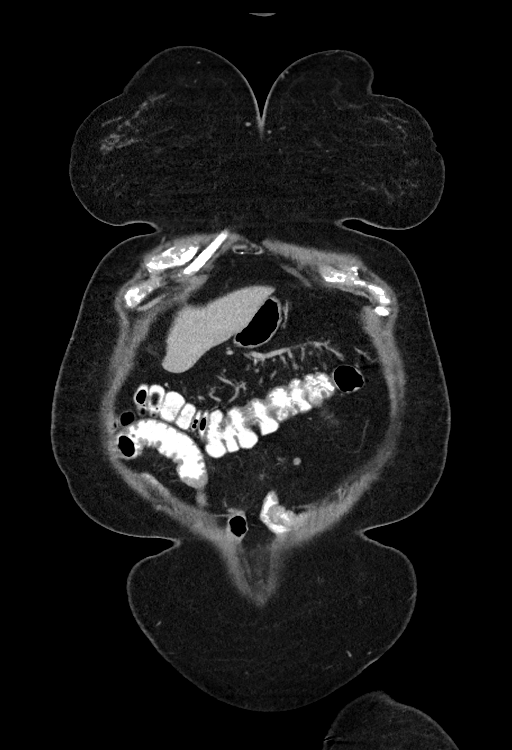
[im 59/146  mediastinal]
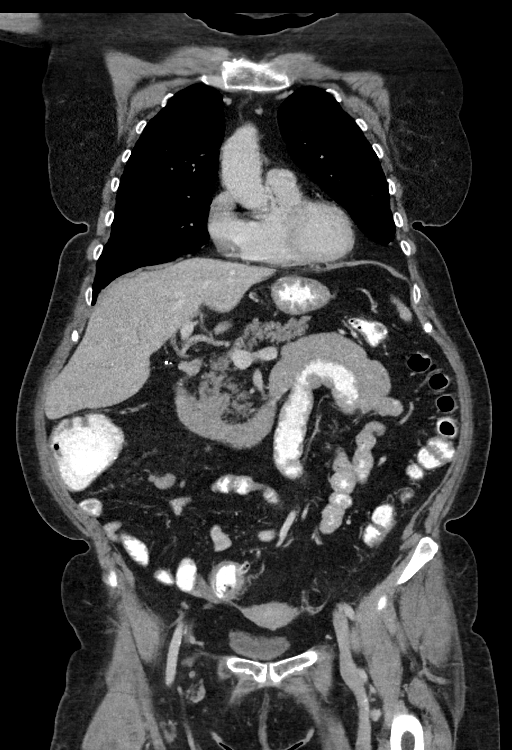
[im 88/146  mediastinal]
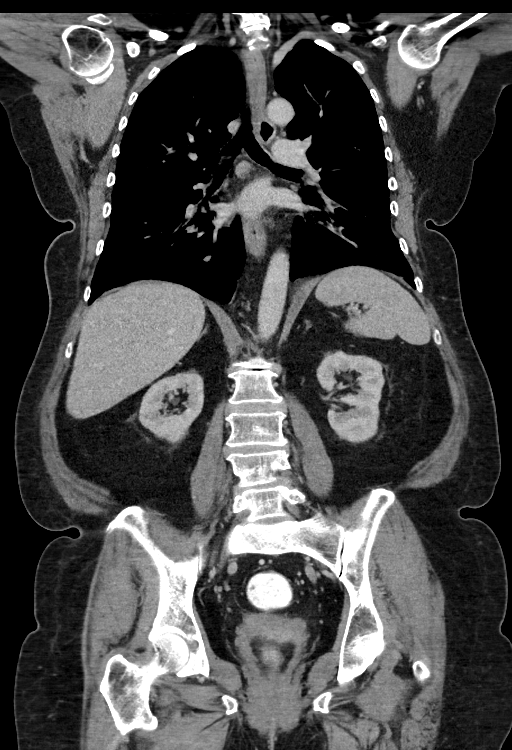

[14 of 36 positions shown; findings below may reference images not displayed]

FINDINGS: CT CHEST FINDINGS

Cardiovascular: Coronary, aortic arch, and branch vessel
atherosclerotic vascular disease.

Mediastinum/Nodes: Stable mediastinal lymph nodes are not
pathologically enlarged by size criteria.

Lungs/Pleura: Stable biapical pleuroparenchymal scarring.

Pleural based 0.8 by 0.3 cm right upper lobe nodule on image 46
series 4, stable.

Right middle lobe nodule near the intersection of the major and
minor fissures measures 1.0 by 0.8 cm on image 76 series 4,
previously 0.9 by 0.6 cm by my measurements.

Mild scarring anteromedially in the right middle lobe. Airway
thickening is present, suggesting bronchitis or reactive airways
disease.

Left lower lobe nodule measures 0.9 by 0.9 cm on image 97 series 4,
formerly 0.6 by 0.5 cm. The left lower lobe posterior basal segment
nodule on image 120 of series 4 measures 1.2 by 0.9 cm, formerly
by 0.6 cm by my measurements.

Musculoskeletal: No significant abnormalities identified.

CT ABDOMEN PELVIS FINDINGS

Hepatobiliary: Unremarkable

Pancreas: Unremarkable

Spleen: Unremarkable

Adrenals/Urinary Tract: Unremarkable

Stomach/Bowel: Small periampullary duodenal diverticulum. Prior
ileocecectomy. Mild sigmoid colon diverticulosis. Questionable fold
accentuation in the duodenum and proximal jejunum, cannot exclude
proximal enteritis.

Vascular/Lymphatic: Atherosclerosis is present, including aortoiliac
atherosclerotic disease.

Reproductive: Unremarkable

Other: Stable scattered omental nodularity including a 7 mm omental
nodule just to the left of midline on image 79 of series 2, an 8 mm
omental nodule in some of the herniated omental adipose tissue along
the supraumbilical hernia, and other small scattered omental
nodules. These were previously hypermetabolic.

Musculoskeletal: Similar appearance of complex ventral hernia with
umbilical and supraumbilical components and several lobulations. A
small margin of the transverse colon extends partially into one of
the superior loculations as shown on image 92 series 6. As noted
above, some of the omental nodularity also involves herniated
omental adipose tissue in this region.

Grade 1 degenerative anterolisthesis L4-5. Lumbar spondylosis and
degenerative disc disease.
IMPRESSION: 1. Three of the pulmonary nodules have intervally enlarged,
suggesting mild progression of malignancy. The remaining pulmonary
nodules in the omental nodules appear stable, and no definite new
nodule is identified.
2. Other imaging findings of potential clinical significance: Aortic
Atherosclerosis ([X6]-[X6]). Coronary atherosclerosis. Systemic
atherosclerosis. Complex ventral supraumbilical hernia with multiple
defects and lobulations. Lumbar spondylosis and degenerative disc
disease

## 2021-02-08 MED ORDER — IOHEXOL 350 MG/ML SOLN
80.0000 mL | Freq: Once | INTRAVENOUS | Status: AC | PRN
  Administered 2021-02-08: 08:00:00 80 mL via INTRAVENOUS

## 2021-02-08 MED ORDER — SODIUM CHLORIDE (PF) 0.9 % IJ SOLN
INTRAMUSCULAR | Status: AC
  Filled 2021-02-08: qty 50

## 2021-02-09 LAB — CHROMOGRANIN A: Chromogranin A (ng/mL): 110.3 ng/mL — ABNORMAL HIGH (ref 0.0–101.8)

## 2021-02-10 ENCOUNTER — Encounter: Payer: Self-pay | Admitting: Hematology

## 2021-02-10 ENCOUNTER — Inpatient Hospital Stay: Payer: Medicare HMO | Admitting: Hematology

## 2021-02-10 ENCOUNTER — Other Ambulatory Visit: Payer: Self-pay

## 2021-02-10 VITALS — BP 134/81 | HR 93 | Temp 98.2°F | Resp 18 | Ht 65.0 in | Wt 176.6 lb

## 2021-02-10 DIAGNOSIS — C7B8 Other secondary neuroendocrine tumors: Secondary | ICD-10-CM

## 2021-02-10 DIAGNOSIS — C7B02 Secondary carcinoid tumors of liver: Secondary | ICD-10-CM | POA: Diagnosis not present

## 2021-02-10 DIAGNOSIS — C7B04 Secondary carcinoid tumors of peritoneum: Secondary | ICD-10-CM | POA: Diagnosis not present

## 2021-02-10 DIAGNOSIS — C7B01 Secondary carcinoid tumors of distant lymph nodes: Secondary | ICD-10-CM | POA: Diagnosis not present

## 2021-02-10 DIAGNOSIS — C7A012 Malignant carcinoid tumor of the ileum: Secondary | ICD-10-CM | POA: Diagnosis not present

## 2021-02-10 DIAGNOSIS — D649 Anemia, unspecified: Secondary | ICD-10-CM | POA: Diagnosis not present

## 2021-02-10 NOTE — Progress Notes (Signed)
[place]   [phone] [phone]   Clinic Follow up Note   Patient Care Team: Binnie Rail, MD as PCP - General (Internal Medicine) Truitt Merle, MD as Consulting Physician (Hematology) Jackolyn Confer, MD as Consulting Physician (General Surgery)  Date of Service:  02/10/2021  CHIEF COMPLAINT: f/u of metastatic carcinoid tumor  CURRENT THERAPY:  PENDING Lanreotide injections  ASSESSMENT & PLAN:  [name] is a 75 y.o. female with   1. Well differentiated low-grade neuroendocrine tumor of terminal ileum (carcinoid tumor), pT3 N1 M0, stage IIIB, peritoneal metastasis 09/2019, probable lung and mediastinal nodules metastasis, indeterminate liver lesions, overall low tumor burden  -She was initially diagnosed in 01/2014. Her tumor has been completely resected. Surgical margins were negative. -10/06/19 Dotatate PET showed metastatic disease to peritoneum, abdominal and mediastinal lymph nodes, and the lungs. Indeterminate liver lesions. 10/29/19 peritoneal biopsy confirmed well-differentiated neuroendocrine tumor. -she started First-line Sandostatin injections monthly on 11/06/19. She tolerated poorly and developed hyperglycemia, fatigue, and the episode of syncope and fall right after the injection. This was discontinued after 3 doses. -CT CAP on 02/08/21 showed interval enlargement of three pulmonary nodules, peritoneal and live mets are stable I reviewed her images myself and discussed the results with her today. -Given her persistent diarrhea and fatigue, I recommend her to try lanreotide. I reviewed the indications for and side effects from the injection, especially hyperglycemia. She is agreeable. We will plan to start in 2 weeks   2. Mild anemia -Hgb of 10.7 noted on 08/05/20. This is new. -she switched to prenatal vitamins after that visit. -she is overdue for colonoscopy. I previously referred her back to GI.   3. Health maintenance -She  has not had colonoscopy since her diagnosis in 01/2014. She is overdue. I will refer her to GI for follow up. -She has also not have mammogram since 03/2018. I ordered for her in 07/2020, but this has not been done.   4. HTN, DM, hypothyroidism, mild neuropathy -She has mild tingling of LE from DM. She rarely uses Gabapentin as needed.  -She is on lipitor, rybelsus, and synthroid. She follows her PCP regularly.     Plan:  -plan to start lanreotide injection in 2 week and continue every 4 weeks, along with B12 injections  -f/u in 6 weeks   No problem-specific Assessment & Plan notes found for this encounter.   SUMMARY OF ONCOLOGIC HISTORY: Oncology History Overview Note  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   Staging form: Colon and Rectum, AJCC 7th Edition     Clinical: T3, N2, M0 - Unsigned     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 ([place])  01/21/2014 Imaging   CT: Distal small bowel obstruction due to 3.4 cm soft tissue mass near the ileocecal valve, with adjacent right lower quadrant mesenteric lymphadenopathy   02/03/2014 Pathologic Stage   invasive well diff neuroendocrine tumor (carcinoid), 3cm, pT3pN2 (5/25 nodes positive). negative margines.   02/03/2014 Surgery   Laparoscopic-assisted right colectomy and resection of distal ileum   02/03/2014 Initial Diagnosis   Metastatic malignant neuroendocrine tumor of the ileocecal valve to regional lymph nodes.    05/31/2015 Imaging   CT A/P w contrast  IMPRESSION: 1. No evidence of metastatic disease. 2. Question mild basilar subpleural pulmonary fibrosis.   09/11/2019 Imaging   CT AP W contrast    IMPRESSION: 1. Redemonstrated postoperative findings of ileocolectomy and reanastomosis. 2. There are prominent lymph nodes in the right mesocolon  which are slightly increased in size compared to prior examination, the largest node measuring 1.8 x 0.7 cm, previously 1.5 x 0.4 cm. Findings are  nonspecific although concerning for nodal metastatic disease. 3. There are additional new small nodules of the transverse mesocolon or omentum measuring up to 9 mm, likewise concerning for nodal or omental metastatic disease. 4. Redemonstrated broad-based midline, fat containing ventral hernia components. 5. Fluid in the endometrial cavity, abnormal in the late postmenopausal setting although unchanged compared to prior examination. 6. Status post interval cholecystectomy. 7. Aortic Atherosclerosis (ICD10-I70.0).   10/06/2019 PET scan   IMPRESSION: 1. Several small nodules in the peritoneal space and retroperitoneal space with intense radiotracer activity consistent with metastatic well differentiated neuroendocrine tumor. 2. Several small liver lesions above background activity are indeterminate. Consider MRI liver with and without contrast. 3. Radiotracer activity within mediastinal lymph nodes and bilateral pulmonary nodules consistent with metastatic well-differentiated neuroendocrine tumor to the lungs. Activity is very low within these lesions but measurable. 4. No clear evidence skeletal metastasis. Single lesion in the posterior LEFT SI joint warrants attention on follow-up. 5. Although there are multiple sites of metastatic disease, the overall tumor burden of metastatic disease very small.   10/28/2019 Relapse/Recurrence   FINAL MICROSCOPIC DIAGNOSIS:   A. PERITONEAL NODULES, BIOPSY:  -  Well-differentiated neuroendocrine tumor (carcinoid)  -  See comment   COMMENT:   By immunohistochemistry, the neoplastic cells are positive for CD56,  chromogranin and synaptophysin with a low proliferative rate by Ki-67  (less than 1%).  These findings are consistent with metastasis of the  patient's previously diagnosed small bowel carcinoid.  These findings  were discussed with Dr. Morey Hummingbird on October 30, 2019.    11/06/2019 -  Chemotherapy   First-line Monthly Sandostatin  injection starting 11/06/19. Held since 01/05/20 due to poor toleration ( hypoglycemia, fatigue, and the episode of syncope and fall right after the injection. The diarrhea and flushing has, but overall symptoms are mild)    05/03/2020 Imaging   CT CAP  IMPRESSION: 1. Stable exam. No new or progressive interval findings. 2. Scattered tiny lung nodules are stable since PET-CT of 10/06/2019. These were hypermetabolic on that exam consistent with metastatic disease. 3. Multiple small omental and mesenteric soft tissue nodules are similar to prior. These were also noted to be hypermetabolic on the PET study. 4. Status post ileocolectomy. 5. Left colonic diverticulosis without diverticulitis. 6. Aortic Atherosclerosis (ICD10-I70.0).     02/08/2021 Imaging   EXAM: CT CHEST, ABDOMEN, AND PELVIS WITH CONTRAST  IMPRESSION: 1. Three of the pulmonary nodules have intervally enlarged, suggesting mild progression of malignancy. The remaining pulmonary nodules in the omental nodules appear stable, and no definite new nodule is identified. 2. Other imaging findings of potential clinical significance: Aortic Atherosclerosis (ICD10-I70.0). Coronary atherosclerosis. Systemic atherosclerosis. Complex ventral supraumbilical hernia with multiple defects and lobulations. Lumbar spondylosis and degenerative disc disease      INTERVAL HISTORY:  Jessica Farrell is here for a follow up of metastatic carcinoid tumor. She was last seen by me on 11/04/20. She presents to the clinic alone. She reports diarrhea, on average 4 times a day.   All other systems were reviewed with the patient and are negative.  MEDICAL HISTORY:  Past Medical History:  Diagnosis Date   Asthma    Cellulitis March  2014   Left foot and ankle   colon ca dx'd 01/2014   Colon polyps    2015    Diabetes mellitus ([place]) 05/05/2012  Type II   Hyperlipidemia    Morbid obesity ([place])    Thyroid disease 1990's   HypoThyroidism     SURGICAL HISTORY: Past Surgical History:  Procedure Laterality Date   CHOLECYSTECTOMY N/A 05/02/2017   Procedure: LAPAROSCOPIC CHOLECYSTECTOMY WITH INTRAOPERATIVE CHOLANGIOGRAM, VENTRAL HERNIA REPAIR;  Surgeon: Jovita Kussmaul, MD;  Location: [facility];  Service: General;  Laterality: N/A;   COLON RESECTION N/A 02/03/2014   Procedure: LAPAROSCOPIC ASSISTED BOWEL RESECTION;  Surgeon: Jackolyn Confer, MD;  Location: [facility];  Service: General;  Laterality: N/A;   FRACTURE SURGERY  2000   Ankle   TOOTH EXTRACTION     wisdom Teeth    I have reviewed the social history and family history with the patient and they are unchanged from previous note.  ALLERGIES:  is allergic to penicillins.  MEDICATIONS:  Current Outpatient Medications  Medication Sig Dispense Refill   albuterol (VENTOLIN HFA) 108 (90 Base) MCG/ACT inhaler Inhale 2 puffs into the lungs every 6 (six) hours as needed for wheezing or shortness of breath. 3 each 8   atorvastatin (LIPITOR) 10 MG tablet TAKE 1 TABLET (10 MG TOTAL) BY MOUTH DAILY. 90 tablet 1   azithromycin (ZITHROMAX) 250 MG tablet Take two tabs the first day and then one tab daily for four days 6 tablet 0   cetirizine (ZYRTEC) 10 MG tablet Take 1 tablet (10 mg total) by mouth daily. 15 tablet 0   fluticasone-salmeterol (ADVAIR DISKUS) 250-50 MCG/ACT AEPB Inhale 1 puff into the lungs in the morning and at bedtime. 60 each 5   gabapentin (NEURONTIN) 100 MG capsule Take 1 capsule (100 mg total) by mouth at bedtime. 90 capsule 1   glucose blood test strip Based on insurance preference. Use as instructed once daily to check sugars. Dx E11.9 100 each 12   Lancets (ONETOUCH ULTRASOFT) lancets Use as instructed once daily to check sugars. Dx E11.9 100 each 12   levothyroxine (SYNTHROID) 150 MCG tablet Take 1 tablet (150 mcg total) by mouth daily. 90 tablet 3   loperamide (IMODIUM) 1 MG/5ML solution Take by mouth as needed for diarrhea or loose stools.     metFORMIN  (GLUCOPHAGE-XR) 750 MG 24 hr tablet TAKE 2 TABLETS EVERY DAY WITH SUPPER (NEW DOSE) 180 tablet 1   montelukast (SINGULAIR) 10 MG tablet TAKE 1 TABLET (10 MG TOTAL) BY MOUTH AT BEDTIME. 90 tablet 1   naproxen (NAPROSYN) 375 MG tablet Take 1 tablet (375 mg total) by mouth 2 (two) times daily. 20 tablet 0   Prenatal Vit-Fe Fumarate-FA (PRENATAL VITAMINS PO) Take by mouth.     Semaglutide (RYBELSUS) 7 MG TABS Take 7 mg by mouth daily. Increasing dose to 7 mg 30 tablet 3   No current facility-administered medications for this visit.    PHYSICAL EXAMINATION: ECOG PERFORMANCE STATUS: 1 - Symptomatic but completely ambulatory  Vitals:   02/10/21 0902  BP: 134/81  Pulse: 93  Resp: 18  Temp: 98.2 F (36.8 C)  SpO2: 98%   Wt Readings from Last 3 Encounters:  02/10/21 176 lb 9.6 oz (80.1 kg)  11/04/20 166 lb 14.4 oz (75.7 kg)  10/26/20 167 lb 9.6 oz (76 kg)     GENERAL:alert, no distress and comfortable SKIN: skin color normal, no rashes or significant lesions EYES: normal, Conjunctiva are pink and non-injected, sclera clear  NEURO: alert & oriented x 3 with fluent speech  LABORATORY DATA:  I have reviewed the data as listed CBC Latest Ref Rng & Units 02/08/2021  02/01/2021 11/04/2020  WBC 4.0 - 10.5 K/uL 6.6 6.1 6.7  Hemoglobin 12.0 - 15.0 g/dL 11.7(L) 11.2(L) 11.4(L)  Hematocrit 36.0 - 46.0 % 36.4 34.1(L) 35.4(L)  Platelets 150 - 400 K/uL 239 216 225     CMP Latest Ref Rng & Units 02/08/2021 02/01/2021 11/04/2020  Glucose 70 - 99 mg/dL 122(H) 165(H) 209(H)  BUN 8 - 23 mg/dL $Remove'18 16 15  'CLrTEjX$ Creatinine 0.44 - 1.00 mg/dL 0.84 0.85 0.90  Sodium 135 - 145 mmol/L 138 138 141  Potassium 3.5 - 5.1 mmol/L 4.2 4.1 3.9  Chloride 98 - 111 mmol/L 106 107 107  CO2 22 - 32 mmol/L $RemoveB'25 23 26  'OOHYVMlh$ Calcium 8.9 - 10.3 mg/dL 9.2 8.6(L) 9.1  Total Protein 6.5 - 8.1 g/dL 6.9 6.7 6.8  Total Bilirubin 0.3 - 1.2 mg/dL 0.3 0.3 0.3  Alkaline Phos 38 - 126 U/L 143(H) 140(H) 155(H)  AST 15 - 41 U/L 13(L) 20 25  ALT  0 - 44 U/L $Remo'17 22 27      'jHcsu$ RADIOGRAPHIC STUDIES: I have personally reviewed the radiological images as listed and agreed with the findings in the report. No results found.    No orders of the defined types were placed in this encounter.  All questions were answered. The patient knows to call the clinic with any problems, questions or concerns. No barriers to learning was detected. The total time spent in the appointment was 30 minutes.     Truitt Merle, MD 02/10/2021   I, Wilburn Mylar, am acting as scribe for Truitt Merle, MD.   I have reviewed the above documentation for accuracy and completeness, and I agree with the above.

## 2021-02-17 ENCOUNTER — Other Ambulatory Visit: Payer: Self-pay | Admitting: Internal Medicine

## 2021-02-17 DIAGNOSIS — E1165 Type 2 diabetes mellitus with hyperglycemia: Secondary | ICD-10-CM

## 2021-02-24 ENCOUNTER — Inpatient Hospital Stay: Payer: Medicare HMO | Attending: Hematology

## 2021-02-24 ENCOUNTER — Other Ambulatory Visit: Payer: Self-pay

## 2021-02-24 VITALS — BP 142/77 | HR 86 | Temp 98.5°F | Resp 17

## 2021-02-24 DIAGNOSIS — C7B8 Other secondary neuroendocrine tumors: Secondary | ICD-10-CM

## 2021-02-24 DIAGNOSIS — C7A012 Malignant carcinoid tumor of the ileum: Secondary | ICD-10-CM | POA: Diagnosis not present

## 2021-02-24 DIAGNOSIS — C7B02 Secondary carcinoid tumors of liver: Secondary | ICD-10-CM | POA: Insufficient documentation

## 2021-02-24 DIAGNOSIS — C7B04 Secondary carcinoid tumors of peritoneum: Secondary | ICD-10-CM | POA: Diagnosis not present

## 2021-02-24 MED ORDER — CYANOCOBALAMIN 1000 MCG/ML IJ SOLN
1000.0000 ug | Freq: Once | INTRAMUSCULAR | Status: AC
  Administered 2021-02-24: 1000 ug via INTRAMUSCULAR
  Filled 2021-02-24: qty 1

## 2021-02-24 MED ORDER — LANREOTIDE ACETATE 120 MG/0.5ML ~~LOC~~ SOLN
120.0000 mg | Freq: Once | SUBCUTANEOUS | Status: AC
  Administered 2021-02-24: 120 mg via SUBCUTANEOUS
  Filled 2021-02-24: qty 120

## 2021-02-24 NOTE — Patient Instructions (Signed)
Lanreotide injection °What is this medication? °LANREOTIDE (lan REE oh tide) is used to reduce blood levels of growth hormone in patients with a condition called acromegaly. It also works to slow or stop tumor growth in patients with neuroendocrine tumors and treat carcinoid syndrome. °This medicine may be used for other purposes; ask your health care provider or pharmacist if you have questions. °COMMON BRAND NAME(S): Somatuline Depot °What should I tell my care team before I take this medication? °They need to know if you have any of these conditions: °diabetes °gallbladder disease °heart disease °kidney disease °liver disease °thyroid disease °an unusual or allergic reaction to lanreotide, other medicines, foods, dyes, or preservatives °pregnant or trying to get pregnant °breast-feeding °How should I use this medication? °This medicine is for injection under the skin. It is given by a health care professional in a hospital or clinic setting. °Contact your pediatrician or health care professional regarding the use of this medicine in children. Special care may be needed. °Overdosage: If you think you have taken too much of this medicine contact a poison control center or emergency room at once. °NOTE: This medicine is only for you. Do not share this medicine with others. °What if I miss a dose? °It is important not to miss your dose. Call your doctor or health care professional if you are unable to keep an appointment. °What may interact with this medication? °This medicine may interact with the following medications: °bromocriptine °cyclosporine °certain medicines for blood pressure, heart disease, irregular heart beat °certain medicines for diabetes °quinidine °terfenadine °This list may not describe all possible interactions. Give your health care provider a list of all the medicines, herbs, non-prescription drugs, or dietary supplements you use. Also tell them if you smoke, drink alcohol, or use illegal drugs.  Some items may interact with your medicine. °What should I watch for while using this medication? °Tell your doctor or healthcare professional if your symptoms do not start to get better or if they get worse. °Visit your doctor or health care professional for regular checks on your progress. Your condition will be monitored carefully while you are receiving this medicine. °This medicine may increase blood sugar. Ask your healthcare provider if changes in diet or medicines are needed if you have diabetes. °You may need blood work done while you are taking this medicine. °Women should inform their doctor if they wish to become pregnant or think they might be pregnant. There is a potential for serious side effects to an unborn child. Talk to your health care professional or pharmacist for more information. Do not breast-feed an infant while taking this medicine or for 6 months after stopping it. °This medicine has caused ovarian failure in some women. This medicine may interfere with the ability to have a child. Talk with your doctor or health care professional if you are concerned about your fertility. °What side effects may I notice from receiving this medication? °Side effects that you should report to your doctor or health care professional as soon as possible: °allergic reactions like skin rash, itching or hives, swelling of the face, lips, or tongue °increased blood pressure °severe stomach pain °signs and symptoms of hgh blood sugar such as being more thirsty or hungry or having to urinate more than normal. You may also feel very tired or have blurry vision. °signs and symptoms of low blood sugar such as feeling anxious; confusion; dizziness; increased hunger; unusually weak or tired; sweating; shakiness; cold; irritable; headache; blurred vision; fast   heartbeat; loss of consciousness °unusually slow heartbeat °Side effects that usually do not require medical attention (report to your doctor or health care  professional if they continue or are bothersome): °constipation °diarrhea °dizziness °headache °muscle pain °muscle spasms °nausea °pain, redness, or irritation at site where injected °This list may not describe all possible side effects. Call your doctor for medical advice about side effects. You may report side effects to FDA at 1-800-FDA-1088. °Where should I keep my medication? °This drug is given in a hospital or clinic and will not be stored at home. °NOTE: This sheet is a summary. It may not cover all possible information. If you have questions about this medicine, talk to your doctor, pharmacist, or health care provider. °© 2022 [employer] (2017-12-26 00:00:00) ° °

## 2021-03-17 ENCOUNTER — Other Ambulatory Visit: Payer: Self-pay

## 2021-03-17 ENCOUNTER — Emergency Department (HOSPITAL_COMMUNITY)
Admission: EM | Admit: 2021-03-17 | Discharge: 2021-03-18 | Disposition: A | Discharge status: Home / Self Care | Payer: Medicare HMO | Attending: Emergency Medicine | Admitting: Emergency Medicine

## 2021-03-17 ENCOUNTER — Emergency Department (HOSPITAL_COMMUNITY): Payer: Medicare HMO

## 2021-03-17 ENCOUNTER — Encounter (HOSPITAL_COMMUNITY): Payer: Self-pay

## 2021-03-17 DIAGNOSIS — Z7984 Long term (current) use of oral hypoglycemic drugs: Secondary | ICD-10-CM | POA: Insufficient documentation

## 2021-03-17 DIAGNOSIS — Z85038 Personal history of other malignant neoplasm of large intestine: Secondary | ICD-10-CM | POA: Diagnosis not present

## 2021-03-17 DIAGNOSIS — E039 Hypothyroidism, unspecified: Secondary | ICD-10-CM | POA: Diagnosis not present

## 2021-03-17 DIAGNOSIS — H539 Unspecified visual disturbance: Secondary | ICD-10-CM | POA: Diagnosis present

## 2021-03-17 DIAGNOSIS — R29818 Other symptoms and signs involving the nervous system: Secondary | ICD-10-CM | POA: Diagnosis not present

## 2021-03-17 DIAGNOSIS — H4710 Unspecified papilledema: Secondary | ICD-10-CM | POA: Insufficient documentation

## 2021-03-17 DIAGNOSIS — E1165 Type 2 diabetes mellitus with hyperglycemia: Secondary | ICD-10-CM | POA: Insufficient documentation

## 2021-03-17 DIAGNOSIS — J45909 Unspecified asthma, uncomplicated: Secondary | ICD-10-CM | POA: Insufficient documentation

## 2021-03-17 DIAGNOSIS — I6782 Cerebral ischemia: Secondary | ICD-10-CM | POA: Diagnosis not present

## 2021-03-17 LAB — CBC WITH DIFFERENTIAL/PLATELET
Abs Immature Granulocytes: 0.03 10*3/uL (ref 0.00–0.07)
Basophils Absolute: 0.1 10*3/uL (ref 0.0–0.1)
Basophils Relative: 1 %
Eosinophils Absolute: 0.1 10*3/uL (ref 0.0–0.5)
Eosinophils Relative: 1 %
HCT: 40.5 % (ref 36.0–46.0)
Hemoglobin: 13 g/dL (ref 12.0–15.0)
Immature Granulocytes: 0 %
Lymphocytes Relative: 22 %
Lymphs Abs: 2 10*3/uL (ref 0.7–4.0)
MCH: 28.2 pg (ref 26.0–34.0)
MCHC: 32.1 g/dL (ref 30.0–36.0)
MCV: 87.9 fL (ref 80.0–100.0)
Monocytes Absolute: 0.5 10*3/uL (ref 0.1–1.0)
Monocytes Relative: 5 %
Neutro Abs: 6.5 10*3/uL (ref 1.7–7.7)
Neutrophils Relative %: 71 %
Platelets: 283 10*3/uL (ref 150–400)
RBC: 4.61 MIL/uL (ref 3.87–5.11)
RDW: 12.4 % (ref 11.5–15.5)
WBC: 9.2 10*3/uL (ref 4.0–10.5)
nRBC: 0 % (ref 0.0–0.2)

## 2021-03-17 LAB — COMPREHENSIVE METABOLIC PANEL
ALT: 18 U/L (ref 0–44)
AST: 15 U/L (ref 15–41)
Albumin: 3.7 g/dL (ref 3.5–5.0)
Alkaline Phosphatase: 140 U/L — ABNORMAL HIGH (ref 38–126)
Anion gap: 5 (ref 5–15)
BUN: 12 mg/dL (ref 8–23)
CO2: 28 mmol/L (ref 22–32)
Calcium: 8.9 mg/dL (ref 8.9–10.3)
Chloride: 106 mmol/L (ref 98–111)
Creatinine, Ser: 0.8 mg/dL (ref 0.44–1.00)
GFR, Estimated: >60 mL/min (ref >60)
Glucose, Bld: 150 mg/dL — ABNORMAL HIGH (ref 70–99)
Potassium: 4.3 mmol/L (ref 3.5–5.1)
Sodium: 139 mmol/L (ref 135–145)
Total Bilirubin: 0.5 mg/dL (ref 0.3–1.2)
Total Protein: 7.3 g/dL (ref 6.5–8.1)

## 2021-03-17 LAB — C-REACTIVE PROTEIN: CRP: 0.7 mg/dL (ref <1.0)

## 2021-03-17 LAB — SEDIMENTATION RATE: Sed Rate: 37 mm/hr — ABNORMAL HIGH (ref 0–22)

## 2021-03-17 LAB — TSH: TSH: 0.013 u[IU]/mL — ABNORMAL LOW (ref 0.350–4.500)

## 2021-03-17 IMAGING — MR MR ORBITS WO/W CM
7 series · 48 of 48 positions shown · IV contrast (gadavist)
Comparison: None.

CLINICAL DATA: Acute neurologic deficit.  Possible optic neuritis

EXAM:
MRI HEAD AND ORBITS WITHOUT AND WITH CONTRAST
TECHNIQUE: Multiplanar, multiecho pulse sequences of the brain and surrounding
structures were obtained without and with intravenous contrast.
Multiplanar, multiecho pulse sequences of the orbits and surrounding
structures were obtained including fat saturation techniques, before
and after intravenous contrast administration.
CONTRAST:  8mL GADAVIST GADOBUTROL 1 MMOL/ML IV SOLN

[Series 6: T1 · sagittal · 5.0mm · 0.75mm/px · 6 of 24 slices shown (1 of 3)]
[im 1/24]
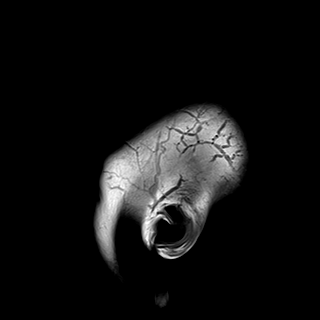
[im 5/24]
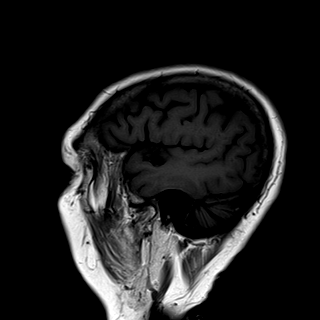
[im 10/24]
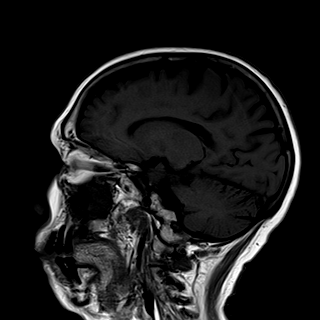
[im 14/24]
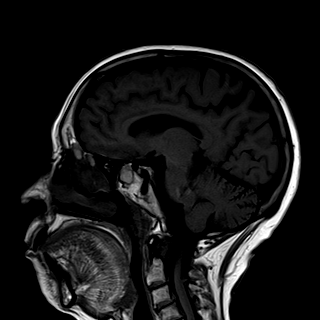
[im 19/24]
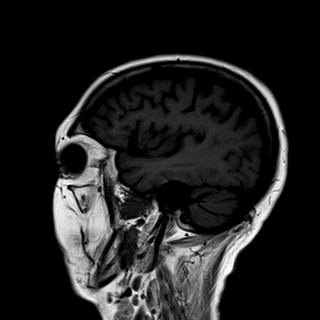
[im 24/24]
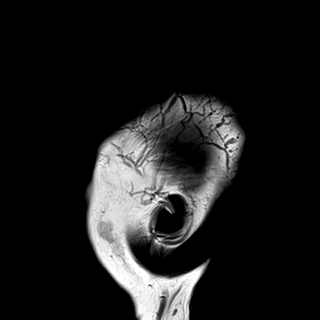

[Series 7: T2 fat-sat · axial · 3.0mm · 0.47mm/px · z∈[-38,+24]mm · 5 of 20 slices shown (1 of 2)]
[im 1/20]
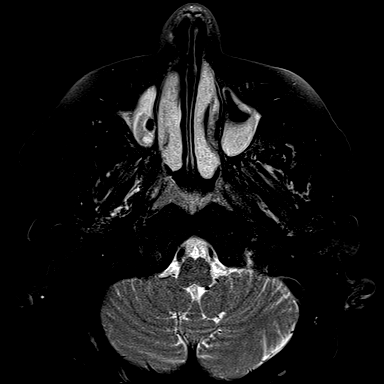
[im 5/20]
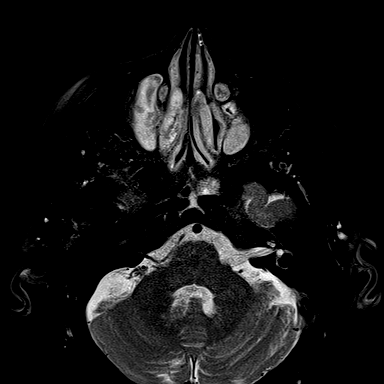
[im 10/20]
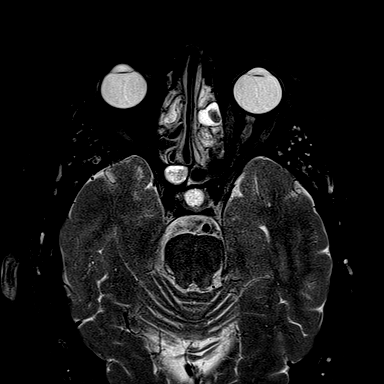
[im 15/20]
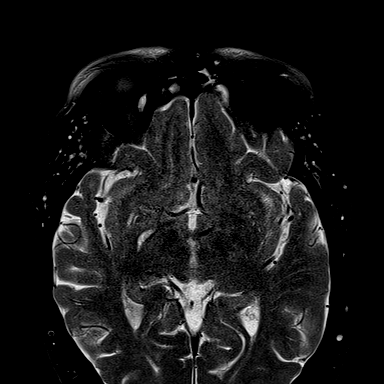
[im 20/20]
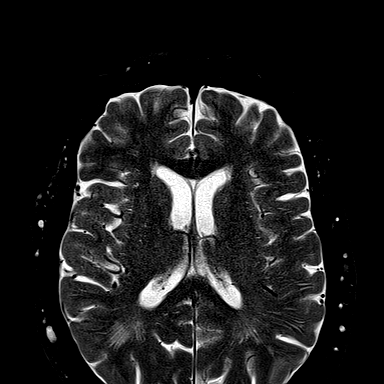

[Series 8: T1 · axial · 3.0mm · 0.70mm/px · z∈[-38,+24]mm · 5 of 20 slices shown (2 of 3)]
[im 1/20]
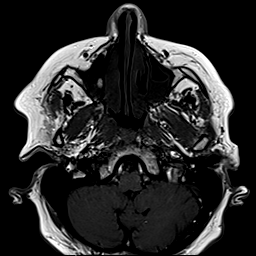
[im 5/20]
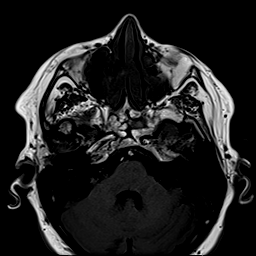
[im 10/20]
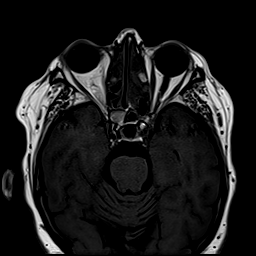
[im 15/20]
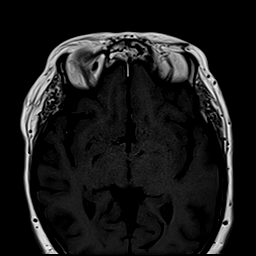
[im 20/20]
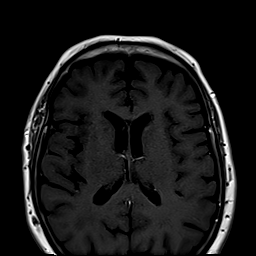

[Series 9: T2 fat-sat · coronal · 3.0mm · 0.56mm/px · 9 of 32 slices shown (2 of 2)]
[im 1/32]
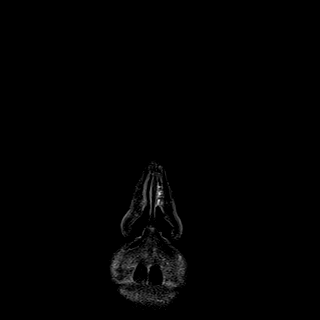
[im 4/32]
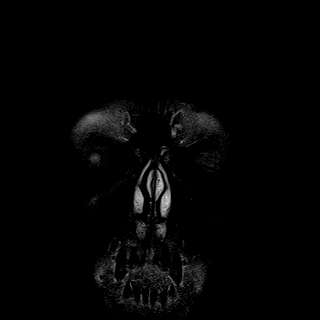
[im 8/32]
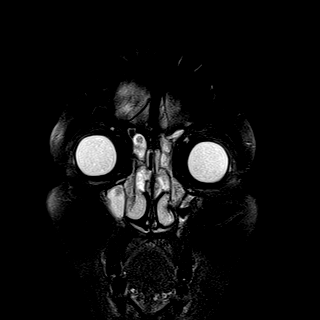
[im 12/32]
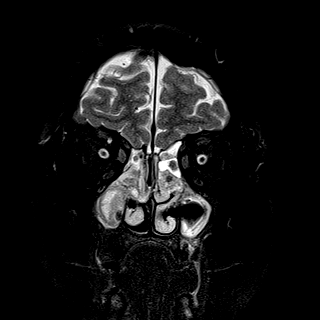
[im 16/32]
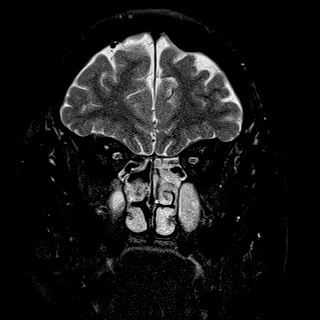
[im 20/32]
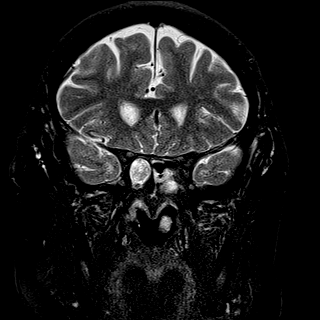
[im 24/32]
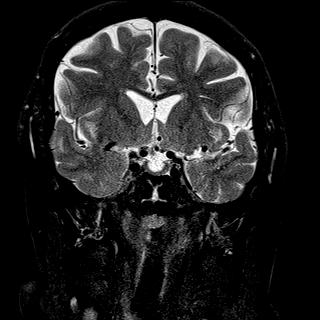
[im 28/32]
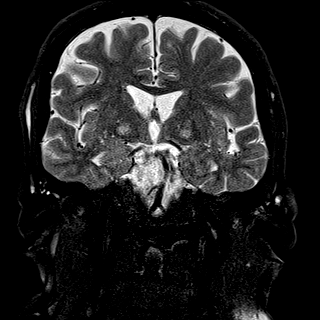
[im 32/32]
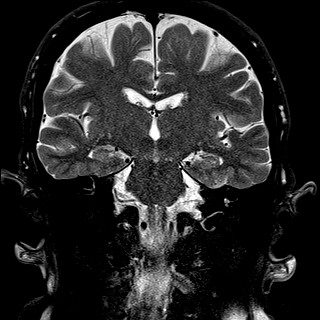

[Series 10: T1 · coronal · 3.0mm · 0.70mm/px · 9 of 32 slices shown (3 of 3)]
[im 1/32]
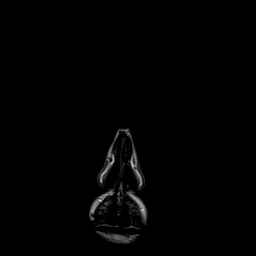
[im 4/32]
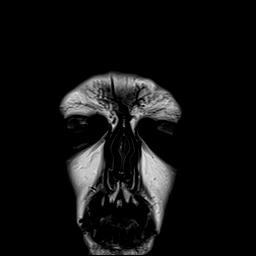
[im 8/32]
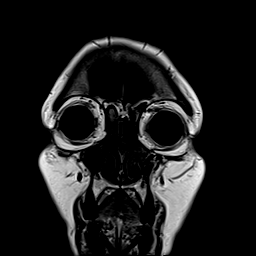
[im 12/32]
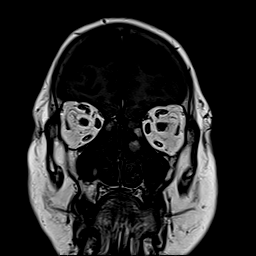
[im 16/32]
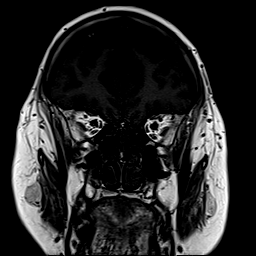
[im 20/32]
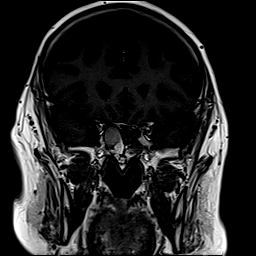
[im 24/32]
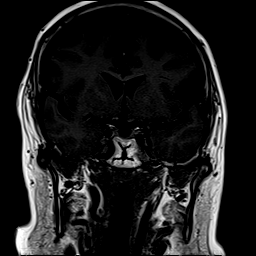
[im 28/32]
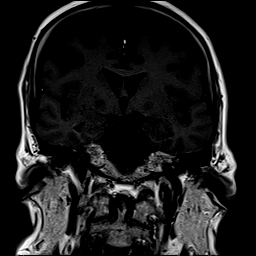
[im 32/32]
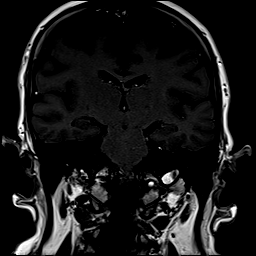

[Series 11: T1 fat-sat post-contrast · axial · 3.0mm · 0.70mm/px · z∈[-38,+24]mm · 5 of 20 slices shown (1 of 2)]
[im 1/20]
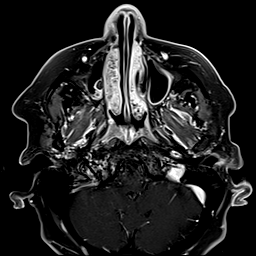
[im 5/20]
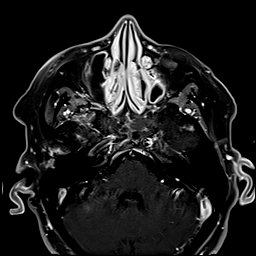
[im 10/20]
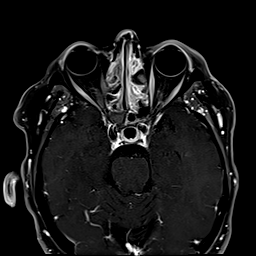
[im 15/20]
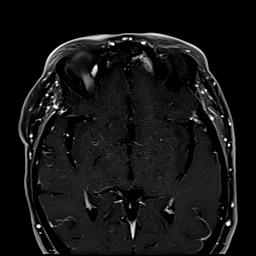
[im 20/20]
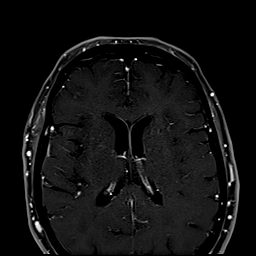

[Series 12: T1 fat-sat post-contrast · coronal · 3.0mm · 0.70mm/px · 9 of 32 slices shown (2 of 2)]
[im 1/32]
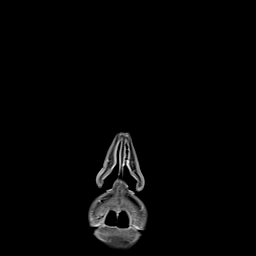
[im 4/32]
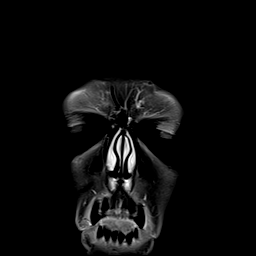
[im 8/32]
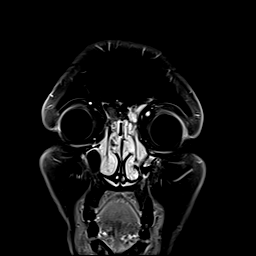
[im 12/32]
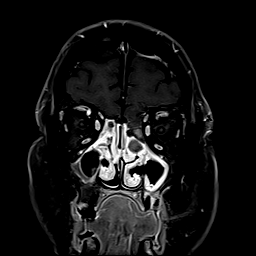
[im 16/32]
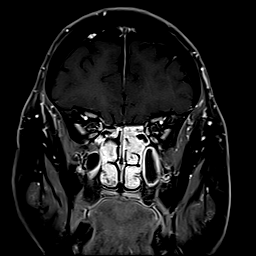
[im 20/32]
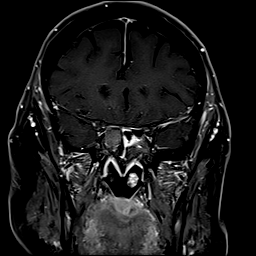
[im 24/32]
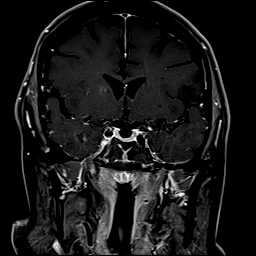
[im 28/32]
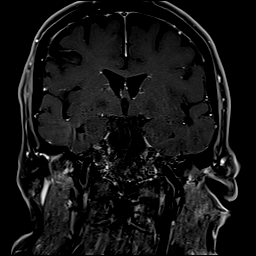
[im 32/32]
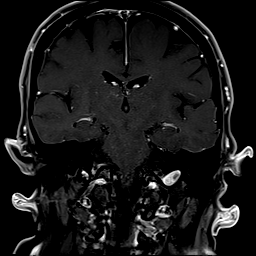

[48 of 48 positions shown; findings below may reference images not displayed]

FINDINGS: MRI HEAD FINDINGS

Brain: No acute infarct, mass effect or extra-axial collection. No
acute or chronic hemorrhage. There is multifocal hyperintense
T2-weighted signal within the white matter. Parenchymal volume and
CSF spaces are normal. The midline structures are normal.

Vascular: Major flow voids are preserved.

Skull and upper cervical spine: Normal calvarium and skull base.
Visualized upper cervical spine and soft tissues are normal.

MRI ORBITS FINDINGS

Orbits:

--Globes: Normal.

--Bony orbit: Normal.

--Preseptal soft tissues: Normal.

--Intra- and extraconal orbital fat: Normal. No inflammatory
stranding.

--Optic nerves: Normal.

--Lacrimal glands and fossae: Normal.

--Extraocular muscles: Normal.

Visualized sinuses: There is near complete opacification of the
ethmoid, sphenoid and maxillary sinuses. The frontal sinuses are
underpneumatized.

Soft tissues: Normal.

Limited intracranial: Normal.
IMPRESSION: 1. Normal MRI of the orbits.
2. Moderate chronic small vessel disease.
3. Chronic pansinusitis.

## 2021-03-17 IMAGING — CT CT HEAD W/O CM
3 series · 14 of 46 positions shown, 16 images · non-contrast
Comparison: [DATE]

CLINICAL DATA: Neuro deficit, acute, stroke suspected



[Series 2: head wo · axial · 0.47mm/px · z∈[-72,+48]mm · 8 of 29 slices shown, 10 images]
[im 3/29  brain]
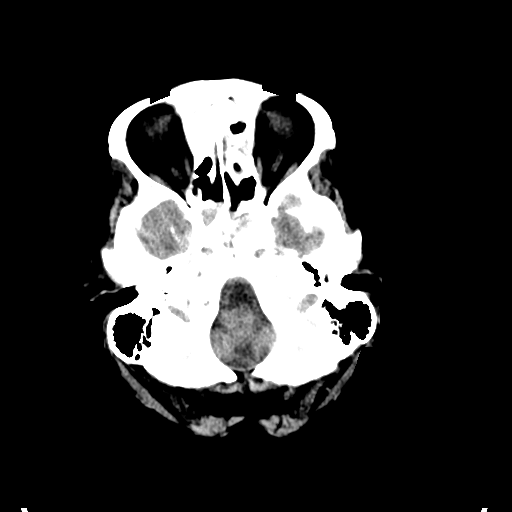
[im 3/29  bone]
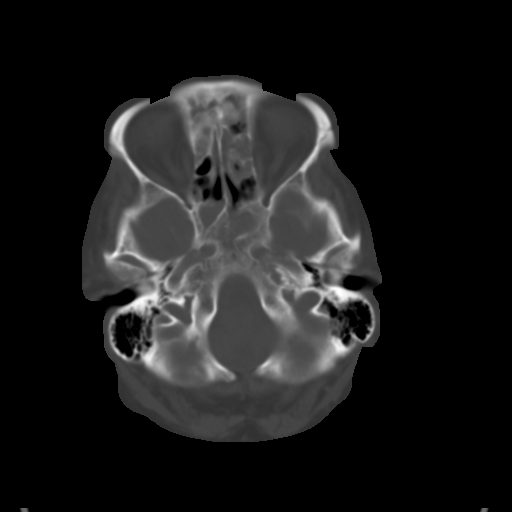
[im 7/29  brain]
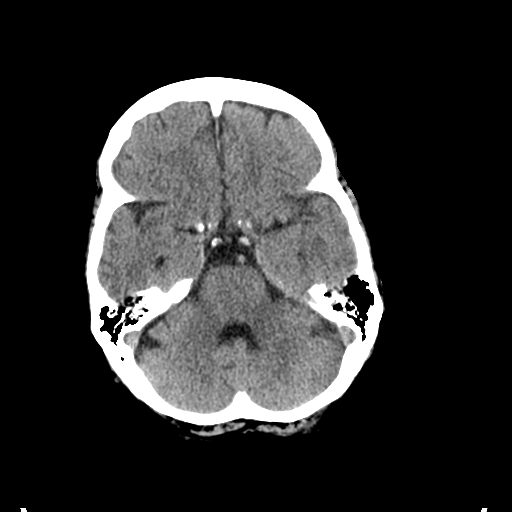
[im 10/29  brain]
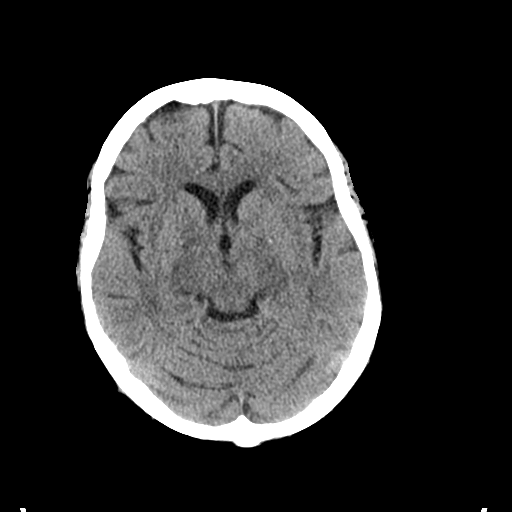
[im 13/29  brain]
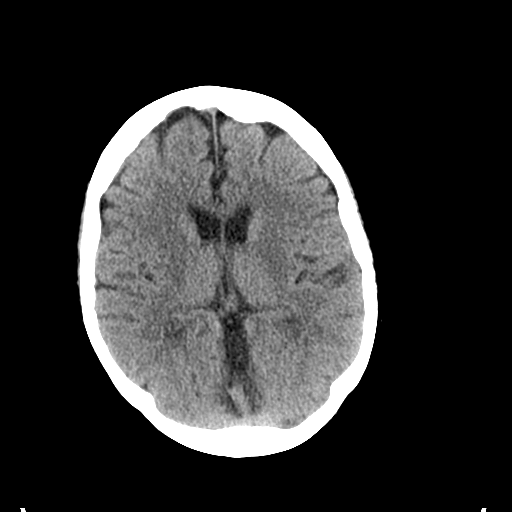
[im 17/29  brain]
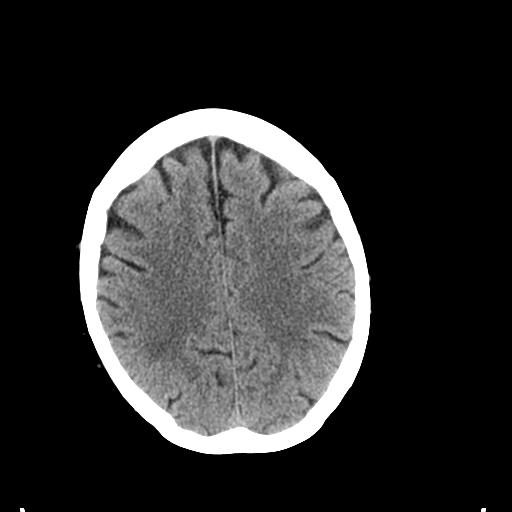
[im 17/29  bone]
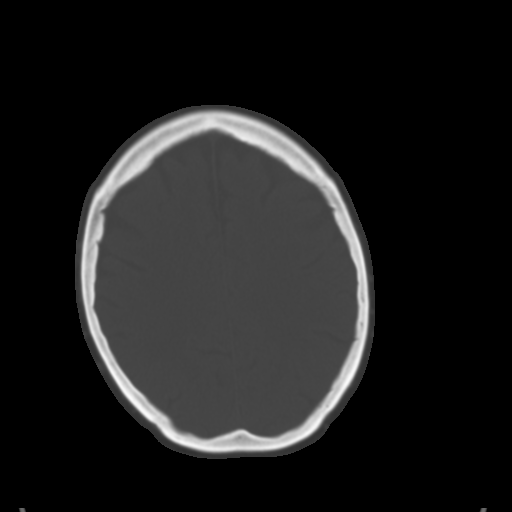
[im 20/29  brain]
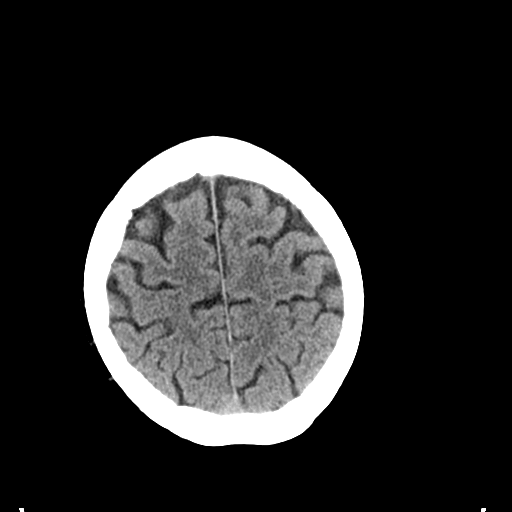
[im 23/29  brain]
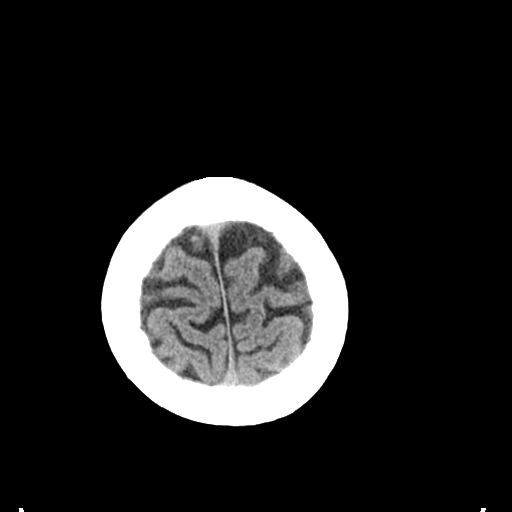
[im 27/29  brain]
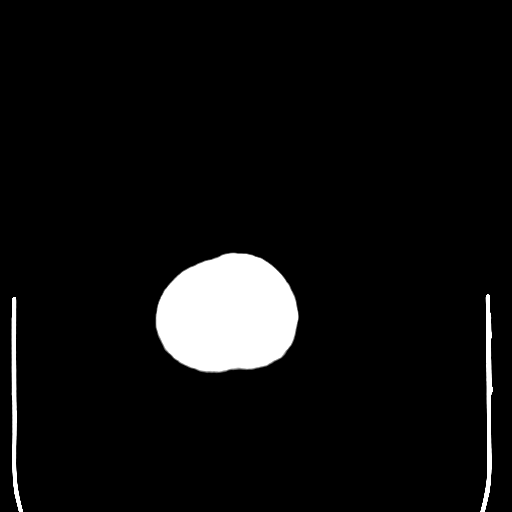

[Series 4: coronal soft tissue · coronal · 0.29mm/px · 3 of 84 slices shown]
[im 28/84  brain]
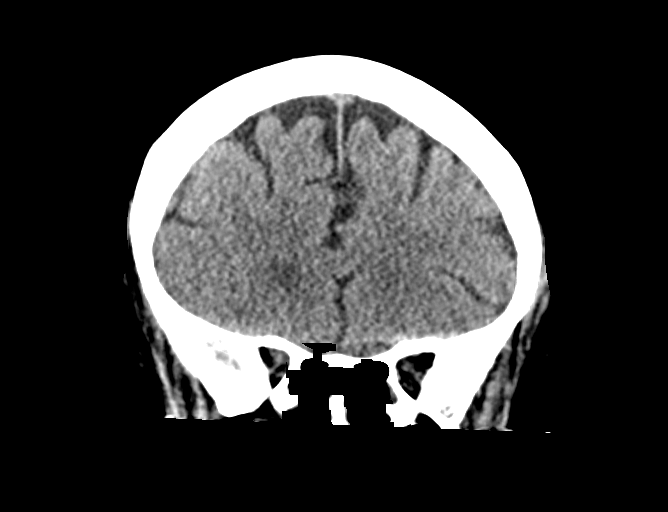
[im 37/84  brain]
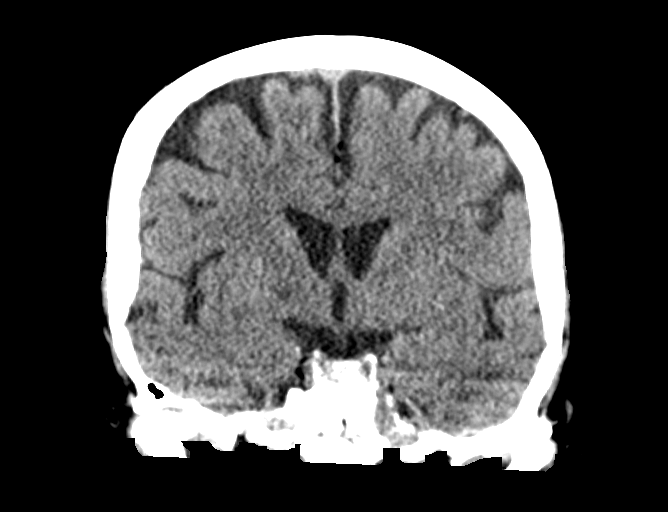
[im 47/84  brain]
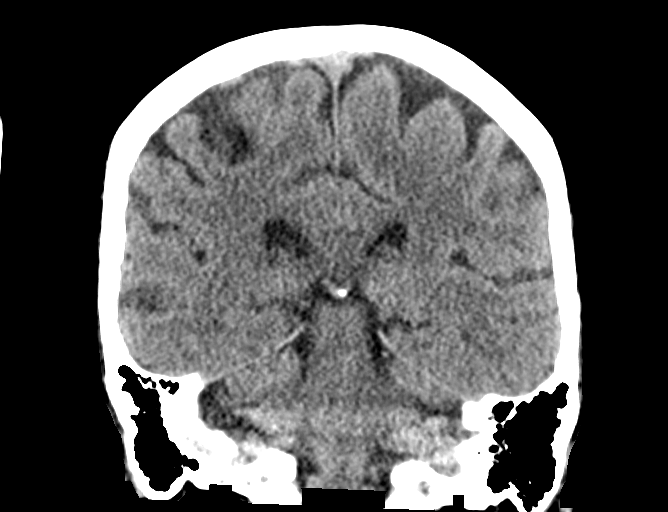

[Series 5: sagittal soft tissue · sagittal · 0.29mm/px · 3 of 65 slices shown]
[im 22/65  brain]
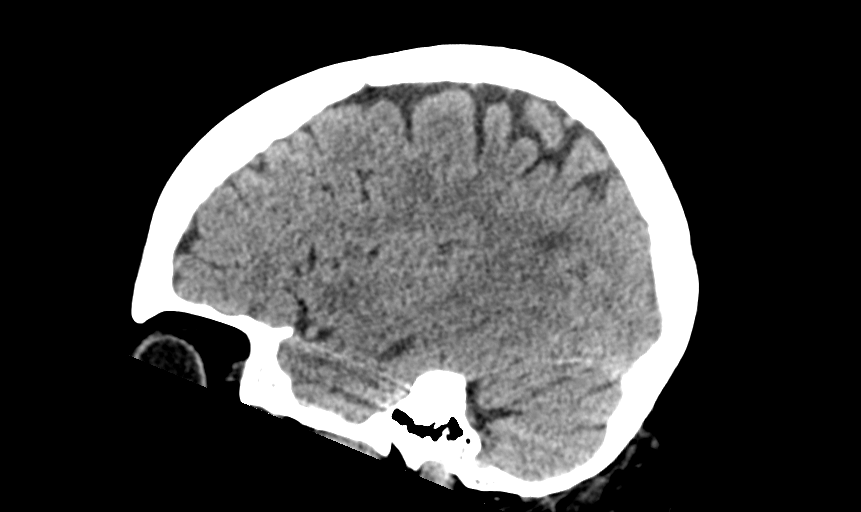
[im 33/65  brain]
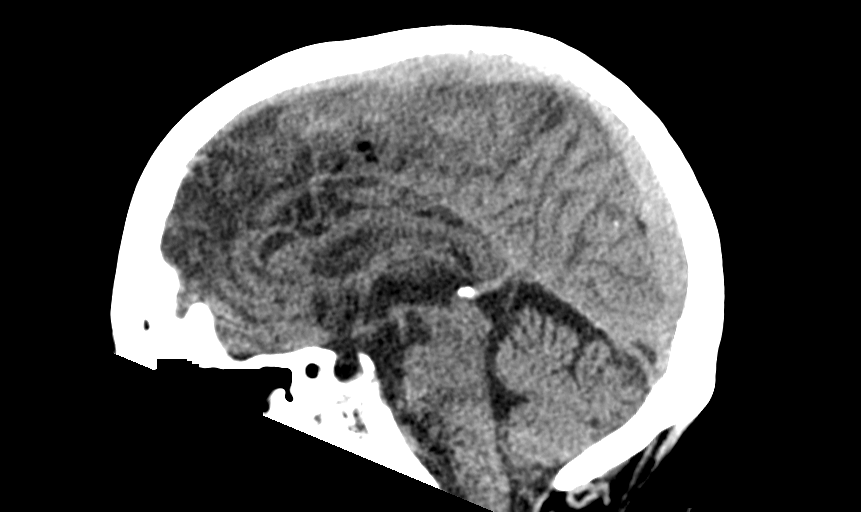
[im 43/65  brain]
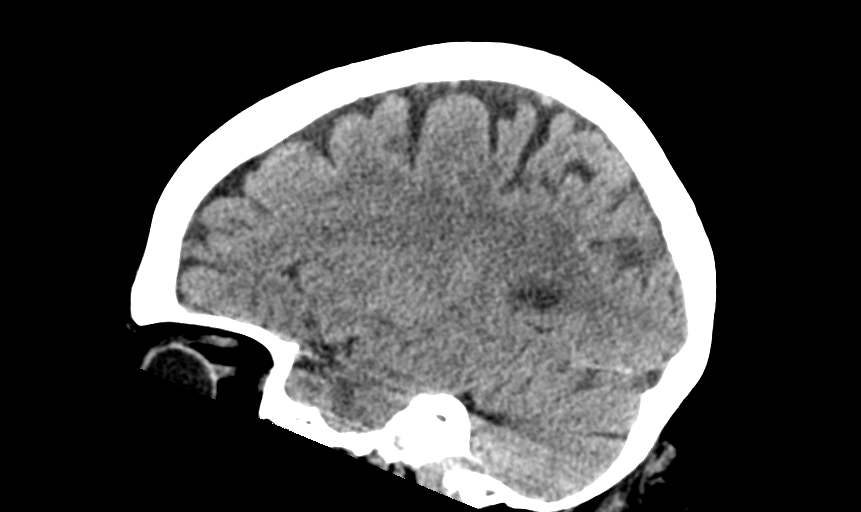

[14 of 46 positions shown; findings below may reference images not displayed]

FINDINGS: Brain: No evidence of acute infarction, hemorrhage, hydrocephalus,
extra-axial collection or mass lesion/mass effect. Scattered
low-density changes within the periventricular and subcortical white
matter compatible with chronic microvascular ischemic change. Mild
diffuse cerebral volume loss.

Vascular: No hyperdense vessel or unexpected calcification.

Skull: Normal. Negative for fracture or focal lesion.

Sinuses/Orbits: Chronic opacification of the ethmoid air cells.

Other: None.
IMPRESSION: 1. No acute intracranial abnormality.
2. Mild chronic microvascular ischemic change and cerebral volume
loss.

## 2021-03-17 IMAGING — MR MR HEAD WO/W CM
13 series · 48 of 48 positions shown · IV contrast (gadavist)
Comparison: None.

CLINICAL DATA: Acute neurologic deficit.  Possible optic neuritis

EXAM:
MRI HEAD AND ORBITS WITHOUT AND WITH CONTRAST
TECHNIQUE: Multiplanar, multiecho pulse sequences of the brain and surrounding
structures were obtained without and with intravenous contrast.
Multiplanar, multiecho pulse sequences of the orbits and surrounding
structures were obtained including fat saturation techniques, before
and after intravenous contrast administration.
CONTRAST:  8mL GADAVIST GADOBUTROL 1 MMOL/ML IV SOLN

[Series 5: DWI · axial · 3.0mm · 1.36mm/px · z∈[-58,+83]mm · 6 of 96 slices shown (1 of 2)]
[im 1/96]
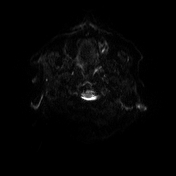
[im 20/96]
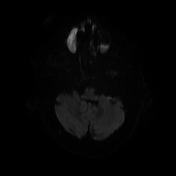
[im 39/96]
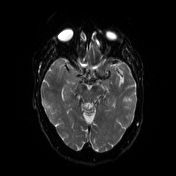
[im 58/96]
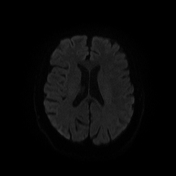
[im 77/96]
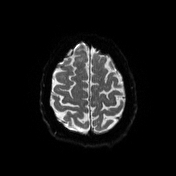
[im 96/96]
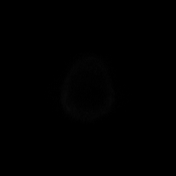

[Series 6: DWI · axial · 3.0mm · 1.36mm/px · z∈[-58,+83]mm · 3 of 48 slices shown (2 of 2)]
[im 1/48]
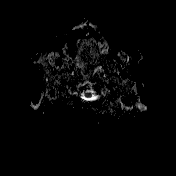
[im 24/48]
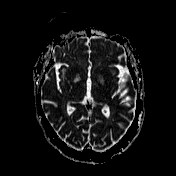
[im 48/48]
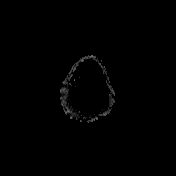

[Series 7: T1 · sagittal · 5.0mm · 0.75mm/px · 2 of 24 slices shown (1 of 2)]
[im 1/24]
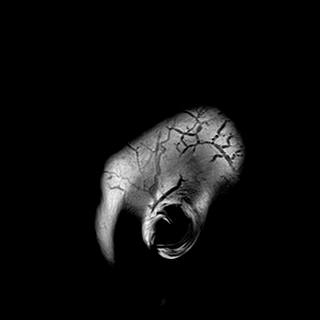
[im 24/24]
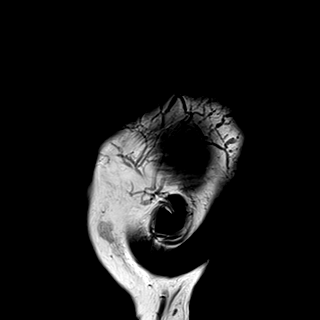

[Series 8: T2 · axial · 5.0mm · 0.62mm/px · z∈[-57,+86]mm · 2 of 23 slices shown]
[im 1/23]
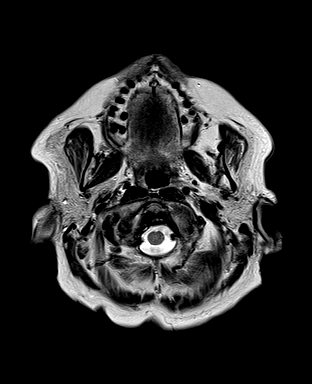
[im 23/23]
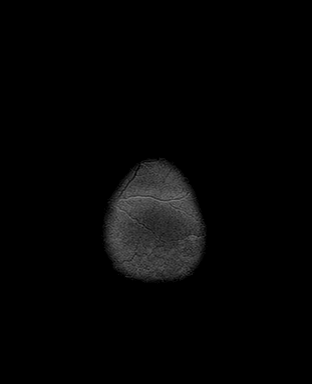

[Series 9: swi_images · axial · 3.0mm · 0.75mm/px · z∈[-56,+85]mm · 3 of 48 slices shown]
[im 1/48]
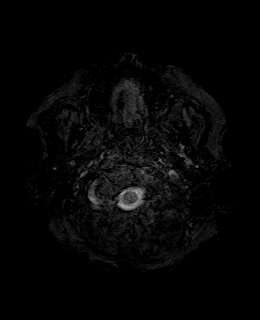
[im 24/48]
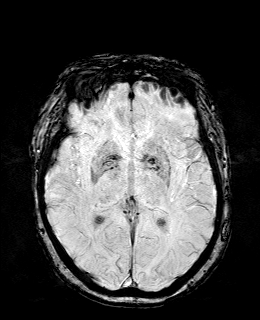
[im 48/48]
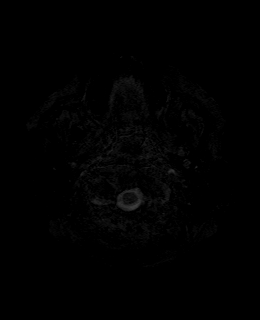

[Series 11: FLAIR · axial · 3.0mm · 0.75mm/px · z∈[-62,+91]mm · 3 of 52 slices shown]
[im 1/52]
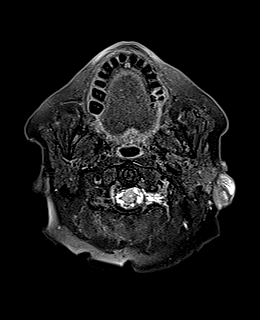
[im 26/52]
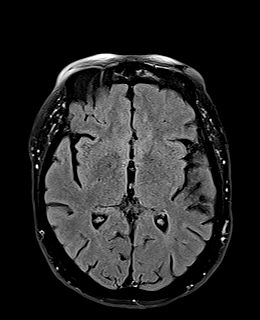
[im 52/52]
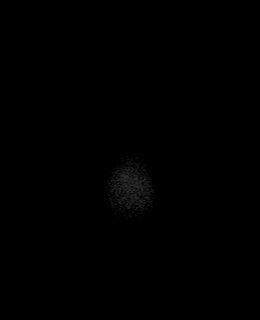

[Series 12: T1 · axial · 1.0mm · 0.94mm/px · z∈[-55,+88]mm · 9 of 144 slices shown (2 of 2)]
[im 1/144]
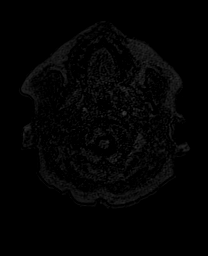
[im 18/144]
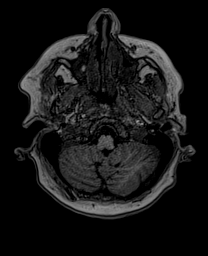
[im 36/144]
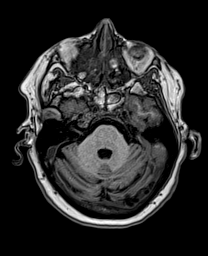
[im 54/144]
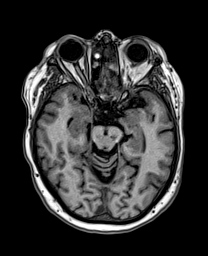
[im 72/144]
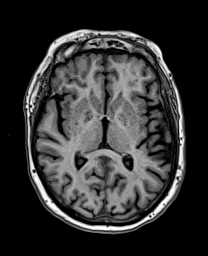
[im 90/144]
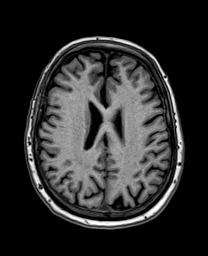
[im 108/144]
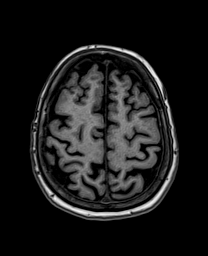
[im 126/144]
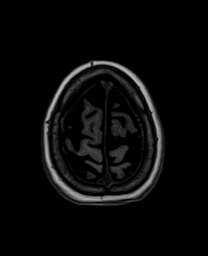
[im 144/144]
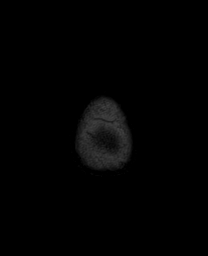

[Series 13: cor dwi_tracew · coronal · 5.0mm · 1.53mm/px · 3 of 52 slices shown]
[im 1/52]
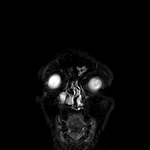
[im 26/52]
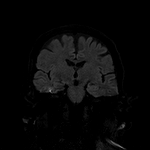
[im 52/52]
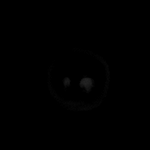

[Series 14: cor dwi_adc · coronal · 5.0mm · 1.53mm/px · 2 of 26 slices shown]
[im 1/26]
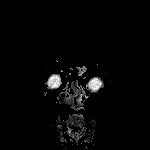
[im 26/26]
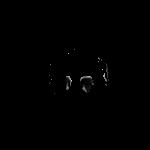

[Series 15: T2 post-contrast · coronal · 5.0mm · 0.69mm/px · 2 of 26 slices shown]
[im 1/26]
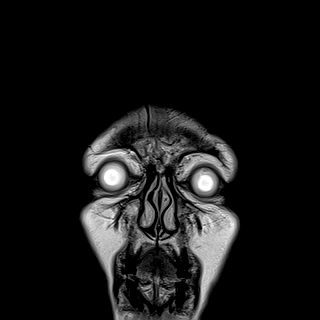
[im 26/26]
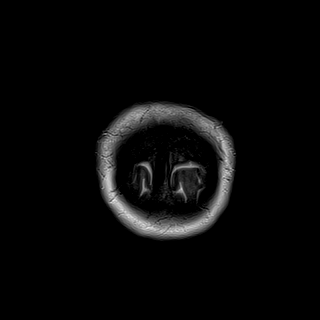

[Series 16: T1 post-contrast · axial · 1.0mm · 0.94mm/px · z∈[-60,+83]mm · 9 of 144 slices shown (1 of 3)]
[im 1/144]
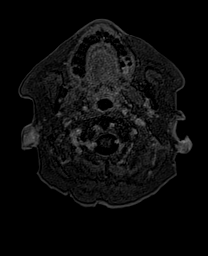
[im 18/144]
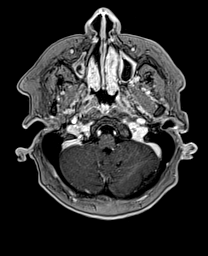
[im 36/144]
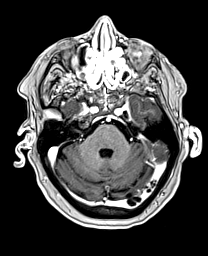
[im 54/144]
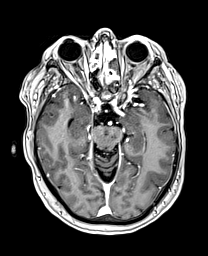
[im 72/144]
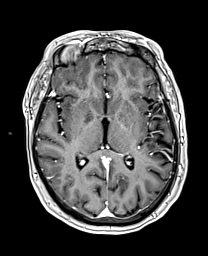
[im 90/144]
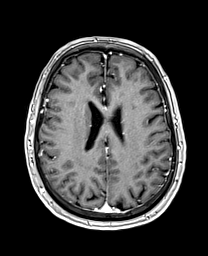
[im 108/144]
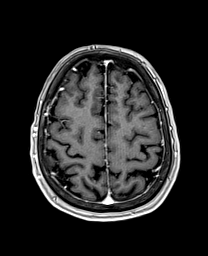
[im 126/144]
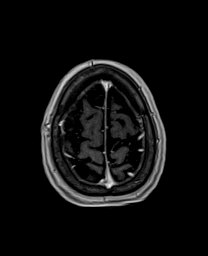
[im 144/144]
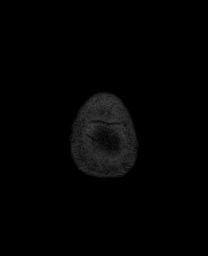

[Series 17: T1 post-contrast · coronal · 5.0mm · 0.43mm/px · 2 of 26 slices shown (2 of 3)]
[im 1/26]
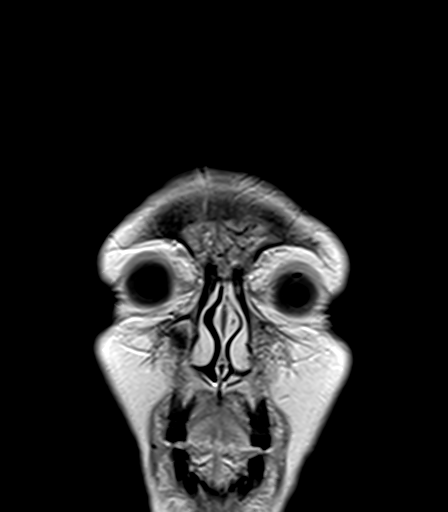
[im 26/26]
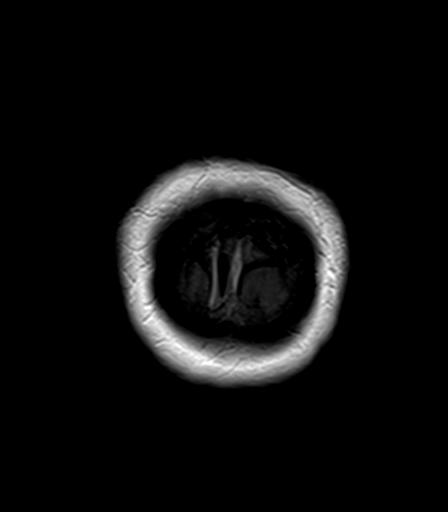

[Series 18: T1 post-contrast · sagittal · 5.0mm · 0.75mm/px · 2 of 24 slices shown (3 of 3)]
[im 1/24]
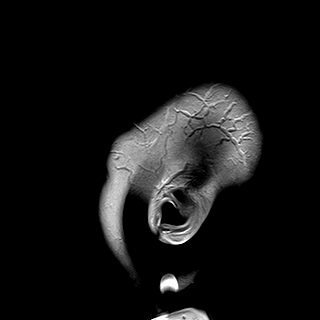
[im 24/24]
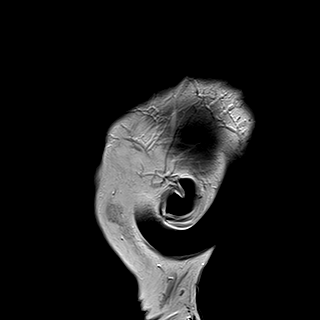

[48 of 48 positions shown; findings below may reference images not displayed]

FINDINGS: MRI HEAD FINDINGS

Brain: No acute infarct, mass effect or extra-axial collection. No
acute or chronic hemorrhage. There is multifocal hyperintense
T2-weighted signal within the white matter. Parenchymal volume and
CSF spaces are normal. The midline structures are normal.

Vascular: Major flow voids are preserved.

Skull and upper cervical spine: Normal calvarium and skull base.
Visualized upper cervical spine and soft tissues are normal.

MRI ORBITS FINDINGS

Orbits:

--Globes: Normal.

--Bony orbit: Normal.

--Preseptal soft tissues: Normal.

--Intra- and extraconal orbital fat: Normal. No inflammatory
stranding.

--Optic nerves: Normal.

--Lacrimal glands and fossae: Normal.

--Extraocular muscles: Normal.

Visualized sinuses: There is near complete opacification of the
ethmoid, sphenoid and maxillary sinuses. The frontal sinuses are
underpneumatized.

Soft tissues: Normal.

Limited intracranial: Normal.
IMPRESSION: 1. Normal MRI of the orbits.
2. Moderate chronic small vessel disease.
3. Chronic pansinusitis.

## 2021-03-17 MED ORDER — GADOBUTROL 1 MMOL/ML IV SOLN
8.0000 mL | Freq: Once | INTRAVENOUS | Status: AC | PRN
  Administered 2021-03-17: 8 mL via INTRAVENOUS

## 2021-03-17 NOTE — ED Provider Notes (Signed)
[place] DEPT Provider Note   CSN: [id number] Arrival date & time: 03/17/21  1352     History  No chief complaint on file.   Jessica Farrell is a 76 y.o. female.  Patient referred in by ophthalmology, Dr. Garner Gavel, of [facility].  The patient with a complaint of some visual abnormalities both eyes for more than 2 weeks.  Evaluation today by them raise concerns for swelling of the right optic nerve.  Patient was referred in for MRI brain and orbit with and without contrast.  Their exam showed right eye visual field defect and disc edema.  In addition they requested ESR, CRP and platelets.  Patient states that nothing acute or worsening symptoms had occurred in the last 24 hours.  Past medical history significant for asthma hyperlipidemia diabetes thyroid disease obesity colon cancer in 2015.  Patient had a colon resection in 2015.  Also had gallbladder removed in 2019.      Home Medications Prior to Admission medications   Medication Sig Start Date End Date Taking? Authorizing Provider  albuterol (VENTOLIN HFA) 108 (90 Base) MCG/ACT inhaler INHALE 2 PUFFS INTO THE LUNGS EVERY 6 (SIX) HOURS AS NEEDED FOR WHEEZING OR SHORTNESS OF BREATH. 02/17/21   Binnie Rail, MD  atorvastatin (LIPITOR) 10 MG tablet TAKE 1 TABLET EVERY DAY 02/17/21   Binnie Rail, MD  azithromycin (ZITHROMAX) 250 MG tablet Take two tabs the first day and then one tab daily for four days 10/26/20   Binnie Rail, MD  cetirizine (ZYRTEC) 10 MG tablet Take 1 tablet (10 mg total) by mouth daily. 01/18/20   Varney Biles, MD  fluticasone-salmeterol (ADVAIR DISKUS) 250-50 MCG/ACT AEPB Inhale 1 puff into the lungs in the morning and at bedtime. 07/26/20   Binnie Rail, MD  gabapentin (NEURONTIN) 100 MG capsule Take 1 capsule (100 mg total) by mouth at bedtime. 01/23/20   Binnie Rail, MD  glucose blood test strip Based on insurance preference. Use as instructed  once daily to check sugars. Dx E11.9 06/04/18   Binnie Rail, MD  Lancets [facility] ULTRASOFT) lancets Use as instructed once daily to check sugars. Dx E11.9 05/23/18   Binnie Rail, MD  levothyroxine (SYNTHROID) 150 MCG tablet Take 1 tablet (150 mcg total) by mouth daily. 10/28/20   Binnie Rail, MD  levothyroxine (SYNTHROID) 150 MCG tablet Take 1 tablet (150 mcg total) by mouth daily. 02/17/21   Binnie Rail, MD  loperamide (IMODIUM) 1 MG/5ML solution Take by mouth as needed for diarrhea or loose stools.    [provider]  metFORMIN (GLUCOPHAGE-XR) 750 MG 24 hr tablet TAKE 2 TABLETS EVERY DAY WITH SUPPER (NEW DOSE) 10/04/20   Binnie Rail, MD  montelukast (SINGULAIR) 10 MG tablet TAKE 1 TABLET AT BEDTIME 02/17/21   Binnie Rail, MD  naproxen (NAPROSYN) 375 MG tablet Take 1 tablet (375 mg total) by mouth 2 (two) times daily. 01/18/20   Varney Biles, MD  Prenatal Vit-Fe Fumarate-FA (PRENATAL VITAMINS PO) Take by mouth.    [provider]  Semaglutide (RYBELSUS) 7 MG TABS Take 7 mg by mouth daily. Increasing dose to 7 mg 11/02/20   Binnie Rail, MD      Allergies    Penicillins    Review of Systems   Review of Systems  Constitutional:  Negative for chills and fever.  HENT:  Negative for ear pain and sore throat.   Eyes:  Positive  for visual disturbance. Negative for pain.  Respiratory:  Negative for cough and shortness of breath.   Cardiovascular:  Negative for chest pain and palpitations.  Gastrointestinal:  Negative for abdominal pain and vomiting.  Genitourinary:  Negative for dysuria and hematuria.  Musculoskeletal:  Negative for arthralgias and back pain.  Skin:  Negative for color change and rash.  Neurological:  Negative for seizures and syncope.  All other systems reviewed and are negative.  Physical Exam Updated Vital Signs BP 136/73 (BP Location: Left Arm)    Pulse 81    Temp 98.4 F (36.9 C) (Oral)    Resp 18    Ht 1.651 m ($Remove'5\' 5"'DwdYpEc$ )    Wt 79.4 kg     SpO2 96%    BMI 29.12 kg/m  Physical Exam Vitals and nursing note reviewed.  Constitutional:      General: She is not in acute distress.    Appearance: Normal appearance. She is well-developed. She is obese.  HENT:     Head: Normocephalic and atraumatic.  Eyes:     Extraocular Movements: Extraocular movements intact.     Conjunctiva/sclera: Conjunctivae normal.     Comments: Right eye measuring about 6 mm.  Left eye measures about 4 mm.  Patient states that both eyes were dilated in the ophthalmology office.  Cardiovascular:     Rate and Rhythm: Normal rate and regular rhythm.     Heart sounds: No murmur heard. Pulmonary:     Effort: Pulmonary effort is normal. No respiratory distress.     Breath sounds: Normal breath sounds.  Abdominal:     Palpations: Abdomen is soft.     Tenderness: There is no abdominal tenderness.  Musculoskeletal:        General: No swelling.     Cervical back: Neck supple.  Skin:    General: Skin is warm and dry.     Capillary Refill: Capillary refill takes less than 2 seconds.  Neurological:     General: No focal deficit present.     Mental Status: She is alert and oriented to person, place, and time.  Psychiatric:        Mood and Affect: Mood normal.    ED Results / Procedures / Treatments   Labs (all labs ordered are listed, but only abnormal results are displayed) Labs Reviewed  SEDIMENTATION RATE - Abnormal; Notable for the following components:      Result Value   Sed Rate 37 (*)    All other components within normal limits  TSH - Abnormal; Notable for the following components:   TSH 0.013 (*)    All other components within normal limits  COMPREHENSIVE METABOLIC PANEL - Abnormal; Notable for the following components:   Glucose, Bld 150 (*)    Alkaline Phosphatase 140 (*)    All other components within normal limits  CBC WITH DIFFERENTIAL/PLATELET  C-REACTIVE PROTEIN    EKG None  Radiology CT Head Wo Contrast  Result Date:  03/17/2021 CLINICAL DATA:  Neuro deficit, acute, stroke suspected EXAM: CT HEAD WITHOUT CONTRAST TECHNIQUE: Contiguous axial images were obtained from the base of the skull through the vertex without intravenous contrast. RADIATION DOSE REDUCTION: This exam was performed according to the departmental dose-optimization program which includes automated exposure control, adjustment of the mA and/or kV according to patient size and/or use of iterative reconstruction technique. COMPARISON:  01/08/2020 FINDINGS: Brain: No evidence of acute infarction, hemorrhage, hydrocephalus, extra-axial collection or mass lesion/mass effect. Scattered low-density changes within the  periventricular and subcortical white matter compatible with chronic microvascular ischemic change. Mild diffuse cerebral volume loss. Vascular: No hyperdense vessel or unexpected calcification. Skull: Normal. Negative for fracture or focal lesion. Sinuses/Orbits: Chronic opacification of the ethmoid air cells. Other: None. IMPRESSION: 1. No acute intracranial abnormality. 2. Mild chronic microvascular ischemic change and cerebral volume loss. Electronically Signed   By: Davina Poke D.O.   On: 03/17/2021 16:09   MR Brain W and Wo Contrast  Result Date: 03/17/2021 CLINICAL DATA:  Acute neurologic deficit.  Possible optic neuritis EXAM: MRI HEAD AND ORBITS WITHOUT AND WITH CONTRAST TECHNIQUE: Multiplanar, multiecho pulse sequences of the brain and surrounding structures were obtained without and with intravenous contrast. Multiplanar, multiecho pulse sequences of the orbits and surrounding structures were obtained including fat saturation techniques, before and after intravenous contrast administration. CONTRAST:  36mL GADAVIST GADOBUTROL 1 MMOL/ML IV SOLN COMPARISON:  None. FINDINGS: MRI HEAD FINDINGS Brain: No acute infarct, mass effect or extra-axial collection. No acute or chronic hemorrhage. There is multifocal hyperintense T2-weighted signal  within the white matter. Parenchymal volume and CSF spaces are normal. The midline structures are normal. Vascular: Major flow voids are preserved. Skull and upper cervical spine: Normal calvarium and skull base. Visualized upper cervical spine and soft tissues are normal. MRI ORBITS FINDINGS Orbits: --Globes: Normal. --Bony orbit: Normal. --Preseptal soft tissues: Normal. --Intra- and extraconal orbital fat: Normal. No inflammatory stranding. --Optic nerves: Normal. --Lacrimal glands and fossae: Normal. --Extraocular muscles: Normal. Visualized sinuses: There is near complete opacification of the ethmoid, sphenoid and maxillary sinuses. The frontal sinuses are underpneumatized. Soft tissues: Normal. Limited intracranial: Normal. IMPRESSION: 1. Normal MRI of the orbits. 2. Moderate chronic small vessel disease. 3. Chronic pansinusitis. Electronically Signed   By: Ulyses Jarred M.D.   On: 03/17/2021 22:18   MR ORBITS W WO CONTRAST  Result Date: 03/17/2021 CLINICAL DATA:  Acute neurologic deficit.  Possible optic neuritis EXAM: MRI HEAD AND ORBITS WITHOUT AND WITH CONTRAST TECHNIQUE: Multiplanar, multiecho pulse sequences of the brain and surrounding structures were obtained without and with intravenous contrast. Multiplanar, multiecho pulse sequences of the orbits and surrounding structures were obtained including fat saturation techniques, before and after intravenous contrast administration. CONTRAST:  48mL GADAVIST GADOBUTROL 1 MMOL/ML IV SOLN COMPARISON:  None. FINDINGS: MRI HEAD FINDINGS Brain: No acute infarct, mass effect or extra-axial collection. No acute or chronic hemorrhage. There is multifocal hyperintense T2-weighted signal within the white matter. Parenchymal volume and CSF spaces are normal. The midline structures are normal. Vascular: Major flow voids are preserved. Skull and upper cervical spine: Normal calvarium and skull base. Visualized upper cervical spine and soft tissues are normal. MRI  ORBITS FINDINGS Orbits: --Globes: Normal. --Bony orbit: Normal. --Preseptal soft tissues: Normal. --Intra- and extraconal orbital fat: Normal. No inflammatory stranding. --Optic nerves: Normal. --Lacrimal glands and fossae: Normal. --Extraocular muscles: Normal. Visualized sinuses: There is near complete opacification of the ethmoid, sphenoid and maxillary sinuses. The frontal sinuses are underpneumatized. Soft tissues: Normal. Limited intracranial: Normal. IMPRESSION: 1. Normal MRI of the orbits. 2. Moderate chronic small vessel disease. 3. Chronic pansinusitis. Electronically Signed   By: Ulyses Jarred M.D.   On: 03/17/2021 22:18    Procedures Procedures    Medications Ordered in ED Medications  gadobutrol (GADAVIST) 1 MMOL/ML injection 8 mL (8 mLs Intravenous Contrast Given 03/17/21 2114)    ED Course/ Medical Decision Making/ A&P  Medical Decision Making Amount and/or Complexity of Data Reviewed Labs: ordered. Radiology: ordered.  Risk Prescription drug management.   Patient referred in from ophthalmologist office for right visual field defect and disc edema.  Differential includes possible stroke.  Optic neuritis, anterior ischemic optic neuropathy, giant cell arteritis.  Patient's head CT without any acute findings.  MRI of brain with and without and MRI orbits with and without is pending.  Patient's TSH is actually low.  Patient has a history of hypothyroidism.  So this is a little low but reassuring can be followed up as an outpatient.  Patient C-reactive protein was normal.  Patient's sed rate was less than 40.  Making it unlikely to represent temporal arteritis or giant cell arteritis.  But does not completely rule it out.  Complete metabolic panel without any significant abnormalities glucose was 150.  CBC normal white count hemoglobin 13.0.  MRI brain with and without and MRI orbits with and without any abnormalities at all.  Optic nerves were read as  normal.  She did have some pansinusitis.  No explanation.  Patient states vision is improving some.  Actually patient thinks that the visual change occurred after one of her shots that she received to beginning of January for the first time related to her neuroendocrine cancer treatment.  Discussed with Dr. Leonel Ramsay neurology.  Felt patient was safe to go home and follow back up with ophthalmology.  Did not see a reason to bring her in for high-dose steroids.     Final Clinical Impression(s) / ED Diagnoses Final diagnoses:  Optic nerve edema    Rx / DC Orders ED Discharge Orders     None         Fredia Sorrow, MD 03/17/21 2326

## 2021-03-17 NOTE — Discharge Instructions (Signed)
Follow-up with Dr. Manuella Ghazi from [facility].  Going to call and set up an appointment.  Today's work-up without any specific explanation for the findings.  Dr. Saralyn Pilar from urology felt it would be fine for you to follow back up with ophthalmology.  Return for any new or worse symptoms.

## 2021-03-17 NOTE — ED Triage Notes (Signed)
Pt c/o blurry vision in the right eye. Patient was seen by her ophthalmologist due to swelling in the right optic nerve. Pt was told she needs an MRI for further examination.

## 2021-03-17 NOTE — ED Notes (Signed)
Removed previous IV 20g from right hand, due to infiltration. Placed sterile gauze and wrapped with kerlix.

## 2021-03-17 NOTE — ED Notes (Signed)
Patient daughter wanted to wait a while for the labs to be drawn. She stated she wanted her mother to hydrate before the blood draw. Patients daughter was educated and made aware that this would delay result timing. Daughter understood.

## 2021-03-23 DIAGNOSIS — I7789 Other specified disorders of arteries and arterioles: Secondary | ICD-10-CM | POA: Diagnosis not present

## 2021-03-23 DIAGNOSIS — H4710 Unspecified papilledema: Secondary | ICD-10-CM | POA: Diagnosis not present

## 2021-03-24 ENCOUNTER — Encounter: Payer: Self-pay | Admitting: Nurse Practitioner

## 2021-03-24 ENCOUNTER — Inpatient Hospital Stay: Payer: Medicare HMO

## 2021-03-24 ENCOUNTER — Telehealth: Payer: Self-pay | Admitting: Nurse Practitioner

## 2021-03-24 ENCOUNTER — Inpatient Hospital Stay: Payer: Medicare HMO | Attending: Hematology | Admitting: Nurse Practitioner

## 2021-03-24 ENCOUNTER — Other Ambulatory Visit: Payer: Self-pay

## 2021-03-24 VITALS — BP 138/73 | HR 95 | Temp 97.9°F | Resp 19 | Ht 65.0 in | Wt 172.0 lb

## 2021-03-24 DIAGNOSIS — Z79899 Other long term (current) drug therapy: Secondary | ICD-10-CM | POA: Diagnosis not present

## 2021-03-24 DIAGNOSIS — C7B8 Other secondary neuroendocrine tumors: Secondary | ICD-10-CM

## 2021-03-24 DIAGNOSIS — H539 Unspecified visual disturbance: Secondary | ICD-10-CM | POA: Diagnosis not present

## 2021-03-24 DIAGNOSIS — E538 Deficiency of other specified B group vitamins: Secondary | ICD-10-CM | POA: Diagnosis not present

## 2021-03-24 DIAGNOSIS — C7B09 Secondary carcinoid tumors of other sites: Secondary | ICD-10-CM | POA: Insufficient documentation

## 2021-03-24 DIAGNOSIS — C7A012 Malignant carcinoid tumor of the ileum: Secondary | ICD-10-CM | POA: Insufficient documentation

## 2021-03-24 MED ORDER — CYANOCOBALAMIN 1000 MCG/ML IJ SOLN
1000.0000 ug | Freq: Once | INTRAMUSCULAR | Status: AC
  Administered 2021-03-24: 1000 ug via INTRAMUSCULAR
  Filled 2021-03-24: qty 1

## 2021-03-24 NOTE — Telephone Encounter (Signed)
Scheduled follow-up appointment per 2/2 los. Patient is aware.

## 2021-03-24 NOTE — Patient Instructions (Signed)
Vitamin B12 Injection °What is this medication? °Vitamin B12 (VAHY tuh min B12) prevents and treats low vitamin B12 levels in your body. It is used in people who do not get enough vitamin B12 from their diet or when their digestive tract does not absorb enough. Vitamin B12 plays an important role in maintaining the health of your nervous system and red blood cells. °This medicine may be used for other purposes; ask your health care provider or pharmacist if you have questions. °COMMON BRAND NAME(S): B-12 Compliance Kit, B-12 Injection Kit, Cyomin, Dodex, LA-12, Nutri-Twelve, Physicians EZ Use B-12, Primabalt °What should I tell my care team before I take this medication? °They need to know if you have any of these conditions: °Kidney disease °Leber's disease °Megaloblastic anemia °An unusual or allergic reaction to cyanocobalamin, cobalt, other medications, foods, dyes, or preservatives °Pregnant or trying to get pregnant °Breast-feeding °How should I use this medication? °This medication is injected into a muscle or deeply under the skin. It is usually given in a clinic or care team's office. However, your care team may teach you how to inject yourself. Follow all instructions. °Talk to your care team about the use of this medication in children. Special care may be needed. °Overdosage: If you think you have taken too much of this medicine contact a poison control center or emergency room at once. °NOTE: This medicine is only for you. Do not share this medicine with others. °What if I miss a dose? °If you are given your dose at a clinic or care team's office, call to reschedule your appointment. If you give your own injections, and you miss a dose, take it as soon as you can. If it is almost time for your next dose, take only that dose. Do not take double or extra doses. °What may interact with this medication? °Colchicine °Heavy alcohol intake °This list may not describe all possible interactions. Give your health  care provider a list of all the medicines, herbs, non-prescription drugs, or dietary supplements you use. Also tell them if you smoke, drink alcohol, or use illegal drugs. Some items may interact with your medicine. °What should I watch for while using this medication? °Visit your care team regularly. You may need blood work done while you are taking this medication. °You may need to follow a special diet. Talk to your care team. Limit your alcohol intake and avoid smoking to get the best benefit. °What side effects may I notice from receiving this medication? °Side effects that you should report to your care team as soon as possible: °Allergic reactions--skin rash, itching, hives, swelling of the face, lips, tongue, or throat °Swelling of the ankles, hands, or feet °Trouble breathing °Side effects that usually do not require medical attention (report to your care team if they continue or are bothersome): °Diarrhea °This list may not describe all possible side effects. Call your doctor for medical advice about side effects. You may report side effects to FDA at 1-800-FDA-1088. °Where should I keep my medication? °Keep out of the reach of children. °Store at room temperature between 15 and 30 degrees C (59 and 85 degrees F). Protect from light. Throw away any unused medication after the expiration date. °NOTE: This sheet is a summary. It may not cover all possible information. If you have questions about this medicine, talk to your doctor, pharmacist, or health care provider. °© 2022 [employer] Standard (2020-04-21 00:00:00) ° °

## 2021-03-24 NOTE — Progress Notes (Signed)
Jessica Farrell   [phone]) [phone] Fax:([phone]   Clinic Follow up Note   Patient Care Team: Binnie Rail, MD as PCP - General (Internal Medicine) Truitt Merle, MD as Consulting Physician (Hematology) Jackolyn Confer, MD as Consulting Physician (General Surgery) 03/24/2021  CHIEF COMPLAINT: Follow up metastatic carcinoid tumor   SUMMARY OF ONCOLOGIC HISTORY: Oncology History Overview Note  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   Staging form: Colon and Rectum, AJCC 7th Edition     Clinical: T3, N2, M0 - Unsigned     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 ([place])  01/21/2014 Imaging   CT: Distal small bowel obstruction due to 3.4 cm soft tissue mass near the ileocecal valve, with adjacent right lower quadrant mesenteric lymphadenopathy   02/03/2014 Pathologic Stage   invasive well diff neuroendocrine tumor (carcinoid), 3cm, pT3pN2 (5/25 nodes positive). negative margines.   02/03/2014 Surgery   Laparoscopic-assisted right colectomy and resection of distal ileum   02/03/2014 Initial Diagnosis   Metastatic malignant neuroendocrine tumor of the ileocecal valve to regional lymph nodes.    05/31/2015 Imaging   CT A/P w contrast  IMPRESSION: 1. No evidence of metastatic disease. 2. Question mild basilar subpleural pulmonary fibrosis.   09/11/2019 Imaging   CT AP W contrast    IMPRESSION: 1. Redemonstrated postoperative findings of ileocolectomy and reanastomosis. 2. There are prominent lymph nodes in the right mesocolon which are slightly increased in size compared to prior examination, the largest node measuring 1.8 x 0.7 cm, previously 1.5 x 0.4 cm. Findings are nonspecific although concerning for nodal metastatic disease. 3. There are additional new small nodules of the transverse mesocolon or omentum measuring up to 9 mm, likewise concerning for nodal or omental metastatic disease. 4. Redemonstrated broad-based  midline, fat containing ventral hernia components. 5. Fluid in the endometrial cavity, abnormal in the late postmenopausal setting although unchanged compared to prior examination. 6. Status post interval cholecystectomy. 7. Aortic Atherosclerosis (ICD10-I70.0).   10/06/2019 PET scan   IMPRESSION: 1. Several small nodules in the peritoneal space and retroperitoneal space with intense radiotracer activity consistent with metastatic well differentiated neuroendocrine tumor. 2. Several small liver lesions above background activity are indeterminate. Consider MRI liver with and without contrast. 3. Radiotracer activity within mediastinal lymph nodes and bilateral pulmonary nodules consistent with metastatic well-differentiated neuroendocrine tumor to the lungs. Activity is very low within these lesions but measurable. 4. No clear evidence skeletal metastasis. Single lesion in the posterior LEFT SI joint warrants attention on follow-up. 5. Although there are multiple sites of metastatic disease, the overall tumor burden of metastatic disease very small.   10/28/2019 Relapse/Recurrence   FINAL MICROSCOPIC DIAGNOSIS:   A. PERITONEAL NODULES, BIOPSY:  -  Well-differentiated neuroendocrine tumor (carcinoid)  -  See comment   COMMENT:   By immunohistochemistry, the neoplastic cells are positive for CD56,  chromogranin and synaptophysin with a low proliferative rate by Ki-67  (less than 1%).  These findings are consistent with metastasis of the  patient's previously diagnosed small bowel carcinoid.  These findings  were discussed with Dr. Morey Hummingbird on October 30, 2019.    11/06/2019 -  Chemotherapy   First-line Monthly Sandostatin injection starting 11/06/19. Held since 01/05/20 due to poor toleration ( hypoglycemia, fatigue, and the episode of syncope and fall right after the injection. The diarrhea and flushing has, but overall symptoms are mild)    05/03/2020 Imaging   CT CAP   IMPRESSION: 1. Stable exam. No new or  progressive interval findings. 2. Scattered tiny lung nodules are stable since PET-CT of 10/06/2019. These were hypermetabolic on that exam consistent with metastatic disease. 3. Multiple small omental and mesenteric soft tissue nodules are similar to prior. These were also noted to be hypermetabolic on the PET study. 4. Status post ileocolectomy. 5. Left colonic diverticulosis without diverticulitis. 6. Aortic Atherosclerosis (ICD10-I70.0).     02/08/2021 Imaging   EXAM: CT CHEST, ABDOMEN, AND PELVIS WITH CONTRAST  IMPRESSION: 1. Three of the pulmonary nodules have intervally enlarged, suggesting mild progression of malignancy. The remaining pulmonary nodules in the omental nodules appear stable, and no definite new nodule is identified. 2. Other imaging findings of potential clinical significance: Aortic Atherosclerosis (ICD10-I70.0). Coronary atherosclerosis. Systemic atherosclerosis. Complex ventral supraumbilical hernia with multiple defects and lobulations. Lumbar spondylosis and degenerative disc disease     CURRENT THERAPY: Lanreotide inj q4 weeks beginning 02/24/2021, and B12 inj q4 weeks  INTERVAL HISTORY: Jessica Farrell returns for follow up and injection as scheduled. Last seen by Dr. Burr Medico 02/10/21. She began lanreotide injections 02/24/21 and continues monthly B12. She experienced right visual abnormalities she describes as "blurry and dim", seen by ophthalmology who was concerned for optic nerve abnormality and disc edema and referred her to ED. Head CT and brain MRI were normal. Sed rate slightly elevated 37, CRP normal.  She went back to ophthalmic who referred her for "biopsy of my vessel."  Today she presents by herself, still experiencing visual abnormalities mainly in the right eye. She can drive and manage at home with more effort and concentration. She feels "woozy" from anesthesia.  She is not ready for lanreotide injection today  but ok to do B12.  She does feel that this helped her diarrhea, but she is still fatigued.   MEDICAL HISTORY:  Past Medical History:  Diagnosis Date   Asthma    Cellulitis March  2014   Left foot and ankle   colon ca dx'd 01/2014   Colon polyps    2015    Diabetes mellitus ([place]) 05/05/2012   Type II   Hyperlipidemia    Morbid obesity ([place])    Thyroid disease 1990's   HypoThyroidism    SURGICAL HISTORY: Past Surgical History:  Procedure Laterality Date   CHOLECYSTECTOMY N/A 05/02/2017   Procedure: LAPAROSCOPIC CHOLECYSTECTOMY WITH INTRAOPERATIVE CHOLANGIOGRAM, VENTRAL HERNIA REPAIR;  Surgeon: Jovita Kussmaul, MD;  Location: [facility];  Service: General;  Laterality: N/A;   COLON RESECTION N/A 02/03/2014   Procedure: LAPAROSCOPIC ASSISTED BOWEL RESECTION;  Surgeon: Jackolyn Confer, MD;  Location: [facility];  Service: General;  Laterality: N/A;   FRACTURE SURGERY  2000   Ankle   TOOTH EXTRACTION     wisdom Teeth    I have reviewed the social history and family history with the patient and they are unchanged from previous note.  ALLERGIES:  is allergic to penicillins.  MEDICATIONS:  Current Outpatient Medications  Medication Sig Dispense Refill   albuterol (VENTOLIN HFA) 108 (90 Base) MCG/ACT inhaler INHALE 2 PUFFS INTO THE LUNGS EVERY 6 (SIX) HOURS AS NEEDED FOR WHEEZING OR SHORTNESS OF BREATH. 54 g 1   atorvastatin (LIPITOR) 10 MG tablet TAKE 1 TABLET EVERY DAY 90 tablet 1   azithromycin (ZITHROMAX) 250 MG tablet Take two tabs the first day and then one tab daily for four days 6 tablet 0   cetirizine (ZYRTEC) 10 MG tablet Take 1 tablet (10 mg total) by mouth daily. 15 tablet 0   fluticasone-salmeterol (ADVAIR DISKUS) 250-50  MCG/ACT AEPB Inhale 1 puff into the lungs in the morning and at bedtime. 60 each 5   gabapentin (NEURONTIN) 100 MG capsule Take 1 capsule (100 mg total) by mouth at bedtime. 90 capsule 1   glucose blood test strip Based on insurance preference. Use as instructed  once daily to check sugars. Dx E11.9 100 each 12   Lancets (ONETOUCH ULTRASOFT) lancets Use as instructed once daily to check sugars. Dx E11.9 100 each 12   levothyroxine (SYNTHROID) 150 MCG tablet Take 1 tablet (150 mcg total) by mouth daily. 90 tablet 3   levothyroxine (SYNTHROID) 150 MCG tablet Take 1 tablet (150 mcg total) by mouth daily. 90 tablet 3   loperamide (IMODIUM) 1 MG/5ML solution Take by mouth as needed for diarrhea or loose stools.     metFORMIN (GLUCOPHAGE-XR) 750 MG 24 hr tablet TAKE 2 TABLETS EVERY DAY WITH SUPPER (NEW DOSE) 180 tablet 1   montelukast (SINGULAIR) 10 MG tablet TAKE 1 TABLET AT BEDTIME 90 tablet 1   naproxen (NAPROSYN) 375 MG tablet Take 1 tablet (375 mg total) by mouth 2 (two) times daily. 20 tablet 0   Prenatal Vit-Fe Fumarate-FA (PRENATAL VITAMINS PO) Take by mouth.     Semaglutide (RYBELSUS) 7 MG TABS Take 7 mg by mouth daily. Increasing dose to 7 mg 30 tablet 3   No current facility-administered medications for this visit.   Facility-Administered Medications Ordered in Other Visits  Medication Dose Route Frequency Provider Last Rate Last Admin   cyanocobalamin ((VITAMIN B-12)) injection 1,000 mcg  1,000 mcg Intramuscular Once Truitt Merle, MD        PHYSICAL EXAMINATION: ECOG PERFORMANCE STATUS: 1 - Symptomatic but completely ambulatory  Vitals:   03/24/21 0944  BP: 138/73  Pulse: 95  Resp: 19  Temp: 97.9 F (36.6 C)  SpO2: 96%   Filed Weights   03/24/21 0944  Weight: 172 lb (78 kg)    GENERAL:alert, no distress and comfortable SKIN: No rash HEENT eyes normal, non-injected.  S/p right temporal biopsy, incision closed without erythema or drainage LUNGS: clear with normal breathing effort HEART: regular rate & rhythm, mild left pedal edema ABDOMEN:abdomen soft, non-tender and normal bowel sounds.  Umbilical hernia noted Musculoskeletal: Nonfocal NEURO: alert & oriented x 3 with fluent speech, no focal motor deficits  LABORATORY DATA:  I  have reviewed the data as listed CBC Latest Ref Rng & Units 03/17/2021 02/08/2021 02/01/2021  WBC 4.0 - 10.5 K/uL 9.2 6.6 6.1  Hemoglobin 12.0 - 15.0 g/dL 13.0 11.7(L) 11.2(L)  Hematocrit 36.0 - 46.0 % 40.5 36.4 34.1(L)  Platelets 150 - 400 K/uL 283 239 216     CMP Latest Ref Rng & Units 03/17/2021 02/08/2021 02/01/2021  Glucose 70 - 99 mg/dL 150(H) 122(H) 165(H)  BUN 8 - 23 mg/dL $Remove'12 18 16  'IEAimDJ$ Creatinine 0.44 - 1.00 mg/dL 0.80 0.84 0.85  Sodium 135 - 145 mmol/L 139 138 138  Potassium 3.5 - 5.1 mmol/L 4.3 4.2 4.1  Chloride 98 - 111 mmol/L 106 106 107  CO2 22 - 32 mmol/L $RemoveB'28 25 23  'JrWXtgKT$ Calcium 8.9 - 10.3 mg/dL 8.9 9.2 8.6(L)  Total Protein 6.5 - 8.1 g/dL 7.3 6.9 6.7  Total Bilirubin 0.3 - 1.2 mg/dL 0.5 0.3 0.3  Alkaline Phos 38 - 126 U/L 140(H) 143(H) 140(H)  AST 15 - 41 U/L 15 13(L) 20  ALT 0 - 44 U/L $Remo'18 17 22      'SDKVn$ RADIOGRAPHIC STUDIES: I have personally reviewed the radiological images as  listed and agreed with the findings in the report. No results found.   ASSESSMENT & PLAN: Jessica Farrell is a 76 y.o. female with    1. Well differentiated low-grade neuroendocrine tumor of terminal ileum (carcinoid tumor), pT3 N1 M0, stage IIIB, peritoneal metastasis 09/2019, probable lung and mediastinal nodules metastasis, indeterminate liver lesions, overall low tumor burden  -Initially diagnosed 01/2014. Her tumor has been completely resected. Surgical margins were negative. -10/06/19 Dotatate PET showed metastatic disease to peritoneum, abdominal and mediastinal lymph nodes, and the lungs. Indeterminate liver lesions. 10/29/19 peritoneal biopsy confirmed well-differentiated neuroendocrine tumor. -she started First-line Sandostatin injections monthly on 11/06/19. She tolerated poorly and developed hyperglycemia, fatigue, and the episode of syncope and fall right after the injection. This was discontinued after 3 doses. -CT CAP on 02/08/21 showed interval enlargement of three pulmonary nodules,  peritoneal and liver mets are stable  -Given her persistent diarrhea and fatigue, Dr. Burr Medico recommended lanreotide.  -S/p first injection 02/24/21, her diarrhea improved, fatigue is stable, but unfortunately she developed visual disturbance that is significantly affecting her vision and injections are on hold pending further work up.  -will obtain path, let her recover, and f/up in 4 weeks  -The plan was reviewed with Dr. Burr Medico.   2. Visual disturbance -began lanreotide injections 02/24/21, shortly after she noticed right visual abnormalities -describes as "blurry and dim" -Seen by Dr. Tama High ophthalmology, concerned for optic nerve abnormality and disc edema, referred to ED -03/17/21 Head CT and brain MRI were normal. Sed rate slightly elevated 37, CRP normal.   -S/p right ?temporal artery biopsy 03/23/21, path is pending -visual disturbance listed as potential SE for somatostatin analogues. Will also check with pharmacy to r/o DDIs which may intensify side effects.  -We will hold lanreotide injections until a diagnosis is made and she recovers.  -if this is not related to lanreotide, she agrees to re-challeng when she feels better   3. Mild anemia -New onset mild anemia with Hgb of 10.7 on 08/05/20 -she switched to prenatal vitamins after that visit. She is currently on B12 inj q4 weeks -she is overdue for colonoscopy. Previously referred her back to GI. -Labs in ED 03/17/2021 showed normal hemoglobin   4. Health maintenance -She has not had colonoscopy since her diagnosis in 01/2014, last mammogram 03/2018. -she is overdue and Dr. Burr Medico has recommended to proceed with screenings   5. HTN, DM, hypothyroidism, mild neuropathy, cataracts s/p surgery -She has mild tingling of LE from DM. She rarely uses Gabapentin as needed.  -She is on lipitor, rybelsus, and synthroid. She follows her PCP regularly.    PLAN: -ED course reviewed -Hold lanreotide today -B12 injection today -DDI check  per pharmacy -Will request path from ophthalmology (Dr. Tama High) -Lab, f/up with Dr. Burr Medico, and possibly injection in 4 weeks pending further work up and her recovery -Plan discussed with MD  All questions were answered. The patient knows to call the clinic with any problems, questions or concerns. No barriers to learning was detected. I spent 20 minutes counseling the patient face to face. The total time spent in the appointment was 30 minutes and more than 50% was on counseling and review of test results     Alla Feeling, NP 03/24/21

## 2021-04-13 DIAGNOSIS — H47011 Ischemic optic neuropathy, right eye: Secondary | ICD-10-CM | POA: Diagnosis not present

## 2021-04-21 ENCOUNTER — Inpatient Hospital Stay: Payer: Medicare HMO

## 2021-04-21 ENCOUNTER — Inpatient Hospital Stay: Payer: Medicare HMO | Attending: Hematology | Admitting: Hematology

## 2021-04-21 ENCOUNTER — Other Ambulatory Visit: Payer: Self-pay

## 2021-04-21 ENCOUNTER — Encounter: Payer: Self-pay | Admitting: Hematology

## 2021-04-21 VITALS — BP 129/62 | HR 73 | Temp 98.5°F | Resp 18 | Ht 65.0 in | Wt 173.8 lb

## 2021-04-21 DIAGNOSIS — C7A012 Malignant carcinoid tumor of the ileum: Secondary | ICD-10-CM | POA: Insufficient documentation

## 2021-04-21 DIAGNOSIS — E039 Hypothyroidism, unspecified: Secondary | ICD-10-CM | POA: Diagnosis not present

## 2021-04-21 DIAGNOSIS — C7B8 Other secondary neuroendocrine tumors: Secondary | ICD-10-CM

## 2021-04-21 DIAGNOSIS — H539 Unspecified visual disturbance: Secondary | ICD-10-CM | POA: Insufficient documentation

## 2021-04-21 DIAGNOSIS — C7B04 Secondary carcinoid tumors of peritoneum: Secondary | ICD-10-CM | POA: Insufficient documentation

## 2021-04-21 DIAGNOSIS — C7B02 Secondary carcinoid tumors of liver: Secondary | ICD-10-CM | POA: Diagnosis not present

## 2021-04-21 DIAGNOSIS — D649 Anemia, unspecified: Secondary | ICD-10-CM | POA: Diagnosis not present

## 2021-04-21 LAB — COMPREHENSIVE METABOLIC PANEL
ALT: 28 U/L (ref 0–44)
AST: 21 U/L (ref 15–41)
Albumin: 3.7 g/dL (ref 3.5–5.0)
Alkaline Phosphatase: 142 U/L — ABNORMAL HIGH (ref 38–126)
Anion gap: 4 — ABNORMAL LOW (ref 5–15)
BUN: 14 mg/dL (ref 8–23)
CO2: 28 mmol/L (ref 22–32)
Calcium: 9.2 mg/dL (ref 8.9–10.3)
Chloride: 106 mmol/L (ref 98–111)
Creatinine, Ser: 0.77 mg/dL (ref 0.44–1.00)
GFR, Estimated: >60 mL/min (ref >60)
Glucose, Bld: 179 mg/dL — ABNORMAL HIGH (ref 70–99)
Potassium: 4.8 mmol/L (ref 3.5–5.1)
Sodium: 138 mmol/L (ref 135–145)
Total Bilirubin: 0.4 mg/dL (ref 0.3–1.2)
Total Protein: 7.1 g/dL (ref 6.5–8.1)

## 2021-04-21 LAB — CBC WITH DIFFERENTIAL/PLATELET
Abs Immature Granulocytes: 0.02 10*3/uL (ref 0.00–0.07)
Basophils Absolute: 0.1 10*3/uL (ref 0.0–0.1)
Basophils Relative: 1 %
Eosinophils Absolute: 0.2 10*3/uL (ref 0.0–0.5)
Eosinophils Relative: 3 %
HCT: 36.9 % (ref 36.0–46.0)
Hemoglobin: 11.7 g/dL — ABNORMAL LOW (ref 12.0–15.0)
Immature Granulocytes: 0 %
Lymphocytes Relative: 32 %
Lymphs Abs: 2.6 10*3/uL (ref 0.7–4.0)
MCH: 27.8 pg (ref 26.0–34.0)
MCHC: 31.7 g/dL (ref 30.0–36.0)
MCV: 87.6 fL (ref 80.0–100.0)
Monocytes Absolute: 0.6 10*3/uL (ref 0.1–1.0)
Monocytes Relative: 7 %
Neutro Abs: 4.8 10*3/uL (ref 1.7–7.7)
Neutrophils Relative %: 57 %
Platelets: 231 10*3/uL (ref 150–400)
RBC: 4.21 MIL/uL (ref 3.87–5.11)
RDW: 12.6 % (ref 11.5–15.5)
WBC: 8.3 10*3/uL (ref 4.0–10.5)
nRBC: 0 % (ref 0.0–0.2)

## 2021-04-21 NOTE — Progress Notes (Signed)
[place]   [phone] Fax:(336) [phone]   Clinic Follow up Note   Patient Care Team: Binnie Rail, MD as PCP - General (Internal Medicine) Truitt Merle, MD as Consulting Physician (Hematology) Jackolyn Confer, MD as Consulting Physician (General Surgery)  Date of Service:  04/21/2021  CHIEF COMPLAINT: f/u of metastatic carcinoid tumor  CURRENT THERAPY:  On hold  ASSESSMENT & PLAN:  Jessica Farrell is a 76 y.o. female with   1. Well differentiated low-grade neuroendocrine tumor of terminal ileum (carcinoid tumor), pT3 N1 M0, stage IIIB, peritoneal metastasis 09/2019, probable lung and mediastinal nodules metastasis, indeterminate liver lesions, overall low tumor burden  -Initially diagnosed 01/2014. Her tumor has been completely resected. Surgical margins were negative. -10/06/19 Dotatate PET showed metastatic disease to peritoneum, abdominal and mediastinal lymph nodes, and the lungs. Indeterminate liver lesions. 10/29/19 peritoneal biopsy confirmed well-differentiated neuroendocrine tumor. -she started First-line Sandostatin injections monthly on 11/06/19. She tolerated poorly and developed hyperglycemia, fatigue, and the episode of syncope and fall right after the injection. This was discontinued after 3 doses. -CT CAP on 02/08/21 showed interval enlargement of three pulmonary nodules, peritoneal and liver mets are stable  -Given her persistent diarrhea and fatigue, she was switched to lanreotide on 02/24/21. Her diarrhea improved, fatigue is stable, but unfortunately she developed visual disturbance that is significantly affecting her vision.  -We discussed changing treatment due to the vision concerns. I discussed the option of Afinitor and Lutathera. She is unsure if she wants to continue treatment given the issues she's had and given her age. I recommend repeat CT in 3 months, then making a decision about treatment at that time. This will allow her to heal and  continue work up. I advised her to call us with any new or worsening symptoms.   2. Visual disturbance -initially noted shortly after starting lanreotide injections on 02/24/21. -describes as "blurry and dim," mainly in her right eye -Seen by Dr. Tama High ophthalmology, concerned for optic nerve abnormality and disc edema, referred to ED -03/17/21 Head CT and brain MRI were normal.  -S/p right ?temporal artery biopsy 03/23/21, path was negative per pt -visual disturbance listed as potential SE for somatostatin analogues. -We will discontinue lanreotide injections.   3. Mild anemia -New onset mild anemia with Hgb of 10.7 on 08/05/20 -she switched to prenatal vitamins after that visit. She is currently on B12 inj q4 weeks -overall mild and stable, hgb 11.7 today (04/21/21)   4. Health maintenance -She has not had colonoscopy since her diagnosis in 01/2014, last mammogram 03/2018. -she is overdue for colonoscopy. Previously referred her back to GI.   5. HTN, DM, hypothyroidism, mild neuropathy, cataracts s/p surgery -She has mild tingling of LE from DM. She rarely uses Gabapentin as needed.  -She is on lipitor, rybelsus, and synthroid. She follows her PCP regularly.     PLAN: -discontinue lanreotide  -f/u in 3 months with lab and CT several days before   No problem-specific Assessment & Plan notes found for this encounter.   SUMMARY OF ONCOLOGIC HISTORY: Oncology History Overview Note  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   Staging form: Colon and Rectum, AJCC 7th Edition     Clinical: T3, N2, M0 - Unsigned     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 ([place])  01/21/2014 Imaging   CT: Distal small bowel obstruction due to 3.4 cm soft tissue mass near the ileocecal valve, with adjacent right lower quadrant mesenteric lymphadenopathy  02/03/2014 Pathologic Stage   invasive well diff neuroendocrine tumor (carcinoid), 3cm, pT3pN2 (5/25 nodes  positive). negative margines.   02/03/2014 Surgery   Laparoscopic-assisted right colectomy and resection of distal ileum   02/03/2014 Initial Diagnosis   Metastatic malignant neuroendocrine tumor of the ileocecal valve to regional lymph nodes.    05/31/2015 Imaging   CT A/P w contrast  IMPRESSION: 1. No evidence of metastatic disease. 2. Question mild basilar subpleural pulmonary fibrosis.   09/11/2019 Imaging   CT AP W contrast    IMPRESSION: 1. Redemonstrated postoperative findings of ileocolectomy and reanastomosis. 2. There are prominent lymph nodes in the right mesocolon which are slightly increased in size compared to prior examination, the largest node measuring 1.8 x 0.7 cm, previously 1.5 x 0.4 cm. Findings are nonspecific although concerning for nodal metastatic disease. 3. There are additional new small nodules of the transverse mesocolon or omentum measuring up to 9 mm, likewise concerning for nodal or omental metastatic disease. 4. Redemonstrated broad-based midline, fat containing ventral hernia components. 5. Fluid in the endometrial cavity, abnormal in the late postmenopausal setting although unchanged compared to prior examination. 6. Status post interval cholecystectomy. 7. Aortic Atherosclerosis (ICD10-I70.0).   10/06/2019 PET scan   IMPRESSION: 1. Several small nodules in the peritoneal space and retroperitoneal space with intense radiotracer activity consistent with metastatic well differentiated neuroendocrine tumor. 2. Several small liver lesions above background activity are indeterminate. Consider MRI liver with and without contrast. 3. Radiotracer activity within mediastinal lymph nodes and bilateral pulmonary nodules consistent with metastatic well-differentiated neuroendocrine tumor to the lungs. Activity is very low within these lesions but measurable. 4. No clear evidence skeletal metastasis. Single lesion in the posterior LEFT SI joint  warrants attention on follow-up. 5. Although there are multiple sites of metastatic disease, the overall tumor burden of metastatic disease very small.   10/28/2019 Relapse/Recurrence   FINAL MICROSCOPIC DIAGNOSIS:   A. PERITONEAL NODULES, BIOPSY:  -  Well-differentiated neuroendocrine tumor (carcinoid)  -  See comment   COMMENT:   By immunohistochemistry, the neoplastic cells are positive for CD56,  chromogranin and synaptophysin with a low proliferative rate by Ki-67  (less than 1%).  These findings are consistent with metastasis of the  patient's previously diagnosed small bowel carcinoid.  These findings  were discussed with Dr. Morey Hummingbird on October 30, 2019.    11/06/2019 -  Chemotherapy   First-line Monthly Sandostatin injection starting 11/06/19. Held since 01/05/20 due to poor toleration ( hypoglycemia, fatigue, and the episode of syncope and fall right after the injection. The diarrhea and flushing has, but overall symptoms are mild)    05/03/2020 Imaging   CT CAP  IMPRESSION: 1. Stable exam. No new or progressive interval findings. 2. Scattered tiny lung nodules are stable since PET-CT of 10/06/2019. These were hypermetabolic on that exam consistent with metastatic disease. 3. Multiple small omental and mesenteric soft tissue nodules are similar to prior. These were also noted to be hypermetabolic on the PET study. 4. Status post ileocolectomy. 5. Left colonic diverticulosis without diverticulitis. 6. Aortic Atherosclerosis (ICD10-I70.0).     02/08/2021 Imaging   EXAM: CT CHEST, ABDOMEN, AND PELVIS WITH CONTRAST  IMPRESSION: 1. Three of the pulmonary nodules have intervally enlarged, suggesting mild progression of malignancy. The remaining pulmonary nodules in the omental nodules appear stable, and no definite new nodule is identified. 2. Other imaging findings of potential clinical significance: Aortic Atherosclerosis (ICD10-I70.0). Coronary atherosclerosis.  Systemic atherosclerosis. Complex ventral supraumbilical hernia with multiple defects and  lobulations. Lumbar spondylosis and degenerative disc disease      INTERVAL HISTORY:  Jessica Farrell is here for a follow up of metastatic carcinoid tumor. She was last seen by NP Lacie on 03/24/21. She presents to the clinic alone. She reports she continues to have vision abnormalities in her right eye. She reports she underwent biopsy of the nerve or blood vessel/artery, which was negative.   All other systems were reviewed with the patient and are negative.  MEDICAL HISTORY:  Past Medical History:  Diagnosis Date   Asthma    Cellulitis March  2014   Left foot and ankle   colon ca dx'd 01/2014   Colon polyps    2015    Diabetes mellitus ([place]) 05/05/2012   Type II   Hyperlipidemia    Morbid obesity ([place])    Thyroid disease 1990's   HypoThyroidism    SURGICAL HISTORY: Past Surgical History:  Procedure Laterality Date   CHOLECYSTECTOMY N/A 05/02/2017   Procedure: LAPAROSCOPIC CHOLECYSTECTOMY WITH INTRAOPERATIVE CHOLANGIOGRAM, VENTRAL HERNIA REPAIR;  Surgeon: Jovita Kussmaul, MD;  Location: [facility];  Service: General;  Laterality: N/A;   COLON RESECTION N/A 02/03/2014   Procedure: LAPAROSCOPIC ASSISTED BOWEL RESECTION;  Surgeon: Jackolyn Confer, MD;  Location: [facility];  Service: General;  Laterality: N/A;   FRACTURE SURGERY  2000   Ankle   TOOTH EXTRACTION     wisdom Teeth    I have reviewed the social history and family history with the patient and they are unchanged from previous note.  ALLERGIES:  is allergic to penicillins.  MEDICATIONS:  Current Outpatient Medications  Medication Sig Dispense Refill   albuterol (VENTOLIN HFA) 108 (90 Base) MCG/ACT inhaler INHALE 2 PUFFS INTO THE LUNGS EVERY 6 (SIX) HOURS AS NEEDED FOR WHEEZING OR SHORTNESS OF BREATH. 54 g 1   atorvastatin (LIPITOR) 10 MG tablet TAKE 1 TABLET EVERY DAY 90 tablet 1   azithromycin (ZITHROMAX) 250 MG tablet Take two  tabs the first day and then one tab daily for four days 6 tablet 0   cetirizine (ZYRTEC) 10 MG tablet Take 1 tablet (10 mg total) by mouth daily. 15 tablet 0   fluticasone-salmeterol (ADVAIR DISKUS) 250-50 MCG/ACT AEPB Inhale 1 puff into the lungs in the morning and at bedtime. 60 each 5   gabapentin (NEURONTIN) 100 MG capsule Take 1 capsule (100 mg total) by mouth at bedtime. 90 capsule 1   glucose blood test strip Based on insurance preference. Use as instructed once daily to check sugars. Dx E11.9 100 each 12   Lancets (ONETOUCH ULTRASOFT) lancets Use as instructed once daily to check sugars. Dx E11.9 100 each 12   levothyroxine (SYNTHROID) 150 MCG tablet Take 1 tablet (150 mcg total) by mouth daily. 90 tablet 3   levothyroxine (SYNTHROID) 150 MCG tablet Take 1 tablet (150 mcg total) by mouth daily. 90 tablet 3   loperamide (IMODIUM) 1 MG/5ML solution Take by mouth as needed for diarrhea or loose stools.     metFORMIN (GLUCOPHAGE-XR) 750 MG 24 hr tablet TAKE 2 TABLETS EVERY DAY WITH SUPPER (NEW DOSE) 180 tablet 1   montelukast (SINGULAIR) 10 MG tablet TAKE 1 TABLET AT BEDTIME 90 tablet 1   naproxen (NAPROSYN) 375 MG tablet Take 1 tablet (375 mg total) by mouth 2 (two) times daily. 20 tablet 0   Prenatal Vit-Fe Fumarate-FA (PRENATAL VITAMINS PO) Take by mouth.     Semaglutide (RYBELSUS) 7 MG TABS Take 7 mg by mouth daily. Increasing dose to  7 mg 30 tablet 3   No current facility-administered medications for this visit.    PHYSICAL EXAMINATION: ECOG PERFORMANCE STATUS: 1 - Symptomatic but completely ambulatory  Vitals:   04/21/21 0944  BP: 129/62  Pulse: 73  Resp: 18  Temp: 98.5 F (36.9 C)  SpO2: 97%   Wt Readings from Last 3 Encounters:  04/21/21 173 lb 12.8 oz (78.8 kg)  03/24/21 172 lb (78 kg)  03/17/21 175 lb (79.4 kg)     GENERAL:alert, no distress and comfortable SKIN: skin color normal, no rashes or significant lesions EYES: normal, Conjunctiva are pink and  non-injected, sclera clear  NEURO: alert & oriented x 3 with fluent speech  LABORATORY DATA:  I have reviewed the data as listed CBC Latest Ref Rng & Units 04/21/2021 03/17/2021 02/08/2021  WBC 4.0 - 10.5 K/uL 8.3 9.2 6.6  Hemoglobin 12.0 - 15.0 g/dL 11.7(L) 13.0 11.7(L)  Hematocrit 36.0 - 46.0 % 36.9 40.5 36.4  Platelets 150 - 400 K/uL 231 283 239     CMP Latest Ref Rng & Units 04/21/2021 03/17/2021 02/08/2021  Glucose 70 - 99 mg/dL 179(H) 150(H) 122(H)  BUN 8 - 23 mg/dL $Remove'14 12 18  'pgiCemJ$ Creatinine 0.44 - 1.00 mg/dL 0.77 0.80 0.84  Sodium 135 - 145 mmol/L 138 139 138  Potassium 3.5 - 5.1 mmol/L 4.8 4.3 4.2  Chloride 98 - 111 mmol/L 106 106 106  CO2 22 - 32 mmol/L $RemoveB'28 28 25  'CLhSvoqW$ Calcium 8.9 - 10.3 mg/dL 9.2 8.9 9.2  Total Protein 6.5 - 8.1 g/dL 7.1 7.3 6.9  Total Bilirubin 0.3 - 1.2 mg/dL 0.4 0.5 0.3  Alkaline Phos 38 - 126 U/L 142(H) 140(H) 143(H)  AST 15 - 41 U/L 21 15 13(L)  ALT 0 - 44 U/L $Remo'28 18 17      'gIDgo$ RADIOGRAPHIC STUDIES: I have personally reviewed the radiological images as listed and agreed with the findings in the report. No results found.    Orders Placed This Encounter  Procedures   CT CHEST ABDOMEN PELVIS W CONTRAST    Standing Status:   Future    Standing Expiration Date:   04/22/2022    Order Specific Question:   Preferred imaging location?    Answer:   [facility]    Order Specific Question:   Is Oral Contrast requested for this exam?    Answer:   Yes, Per Radiology protocol   All questions were answered. The patient knows to call the clinic with any problems, questions or concerns. No barriers to learning was detected. The total time spent in the appointment was 30 minutes.     Truitt Merle, MD 04/21/2021   I, Wilburn Mylar, am acting as scribe for Truitt Merle, MD.   I have reviewed the above documentation for accuracy and completeness, and I agree with the above.

## 2021-04-22 ENCOUNTER — Telehealth: Payer: Self-pay | Admitting: Hematology

## 2021-04-22 LAB — CHROMOGRANIN A: Chromogranin A (ng/mL): 65.2 ng/mL (ref 0.0–101.8)

## 2021-04-22 NOTE — Telephone Encounter (Signed)
Left message with follow-up appointments per 3/2 los. ?

## 2021-04-25 ENCOUNTER — Encounter: Payer: Self-pay | Admitting: Internal Medicine

## 2021-04-25 NOTE — Progress Notes (Signed)
? ? ?Subjective:  ? ? Patient ID: Jessica Farrell, female    DOB: 1945-11-27, 76 y.o.   MRN: [id number] ? ? ?This visit occurred during the SARS-CoV-2 public health emergency.  Safety protocols were in place, including screening questions prior to the visit, additional usage of staff PPE, and extensive cleaning of exam room while observing appropriate contact time as indicated for disinfecting solutions. ? ? ? ?HPI ?Paula is here for  ?Chief Complaint  ?Patient presents with  ? Annual Exam  ? ?Imaging showed progression of her cancer.  Had "cancer shot" beginning of Jan.  Her insides were burning and eyesight was messed up.  R optic nerve was swollen.  It also caused her sugars to be very high.  Her sugars are still elevated.  She plans on holding off on any further cancer shots until follow-up imaging and May. ? ?Sugar in am 219 ish.  Has stopped eating breakfast to try to help get her sugars done.  ? ? ? ?Medications and allergies reviewed with patient and updated if appropriate. ? ? ? ?Current Outpatient Medications on File Prior to Visit  ?Medication Sig Dispense Refill  ? albuterol (VENTOLIN HFA) 108 (90 Base) MCG/ACT inhaler INHALE 2 PUFFS INTO THE LUNGS EVERY 6 (SIX) HOURS AS NEEDED FOR WHEEZING OR SHORTNESS OF BREATH. 54 g 1  ? atorvastatin (LIPITOR) 10 MG tablet TAKE 1 TABLET EVERY DAY 90 tablet 1  ? cetirizine (ZYRTEC) 10 MG tablet Take 1 tablet (10 mg total) by mouth daily. 15 tablet 0  ? erythromycin ophthalmic ointment 3 (three) times daily.    ? fluticasone-salmeterol (ADVAIR DISKUS) 250-50 MCG/ACT AEPB Inhale 1 puff into the lungs in the morning and at bedtime. 60 each 5  ? gabapentin (NEURONTIN) 100 MG capsule Take 1 capsule (100 mg total) by mouth at bedtime. 90 capsule 1  ? glucose blood test strip Based on insurance preference. Use as instructed once daily to check sugars. Dx E11.9 100 each 12  ? Lancets (ONETOUCH ULTRASOFT) lancets Use as instructed once daily to check sugars. Dx E11.9 100  each 12  ? levothyroxine (SYNTHROID) 150 MCG tablet Take 1 tablet (150 mcg total) by mouth daily. 90 tablet 3  ? loperamide (IMODIUM) 1 MG/5ML solution Take by mouth as needed for diarrhea or loose stools.    ? metFORMIN (GLUCOPHAGE-XR) 750 MG 24 hr tablet TAKE 2 TABLETS EVERY DAY WITH SUPPER (NEW DOSE) 180 tablet 1  ? montelukast (SINGULAIR) 10 MG tablet TAKE 1 TABLET AT BEDTIME 90 tablet 1  ? naproxen (NAPROSYN) 375 MG tablet Take 1 tablet (375 mg total) by mouth 2 (two) times daily. 20 tablet 0  ? Prenatal Vit-Fe Fumarate-FA (PRENATAL VITAMINS PO) Take by mouth.    ? ?No current facility-administered medications on file prior to visit.  ? ? ?Review of Systems  ?Constitutional:  Negative for fever.  ?      flashes  ?Eyes:  Positive for visual disturbance (right optic nerve swelling).  ?Respiratory:  Negative for cough, shortness of breath and wheezing.   ?Cardiovascular:  Positive for leg swelling. Negative for chest pain and palpitations.  ?Gastrointestinal:  Positive for diarrhea. Negative for abdominal pain, blood in stool, constipation and nausea.  ?Genitourinary:  Negative for dysuria.  ?Musculoskeletal:  Positive for back pain. Negative for arthralgias.  ?Skin:  Positive for rash (intermittent under breast).  ?Neurological:  Negative for light-headedness and headaches.  ?Psychiatric/Behavioral:  Negative for dysphoric mood. The patient is not nervous/anxious.   ? ?   ?  Objective:  ? ?Vitals:  ? 04/26/21 0920  ?BP: 120/68  ?Pulse: 81  ?Temp: 98.3 ?F (36.8 ?C)  ?SpO2: 97%  ? ?Filed Weights  ? 04/26/21 0920  ?Weight: 172 lb 3.2 oz (78.1 kg)  ? ?Body mass index is 28.66 kg/m?. ? ?BP Readings from Last 3 Encounters:  ?04/26/21 120/68  ?04/21/21 129/62  ?03/24/21 138/73  ? ? ?Wt Readings from Last 3 Encounters:  ?04/26/21 172 lb 3.2 oz (78.1 kg)  ?04/21/21 173 lb 12.8 oz (78.8 kg)  ?03/24/21 172 lb (78 kg)  ? ? ?Depression screen [facility] 2/9 04/26/2021 04/22/2020 02/25/2019 12/29/2016 09/15/2015  ?Decreased Interest 0 0 0 0 0   ?Down, Depressed, Hopeless 0 0 0 0 0  ?PHQ - 2 Score 0 0 0 0 0  ? ? ? ?No flowsheet data found. ? ? ? ?  ?Physical Exam ?Constitutional: She appears well-developed and well-nourished. No distress.  ?HENT:  ?Head: Normocephalic and atraumatic.  ?Right Ear: External ear normal. Normal ear canal and TM ?Left Ear: External ear normal.  Normal ear canal and TM ?Mouth/Throat: Oropharynx is clear and moist.  ?Eyes: Conjunctivae and EOM are normal.  ?Neck: Neck supple. No tracheal deviation present. No thyromegaly present.  ?No carotid bruit  ?Cardiovascular: Normal rate, regular rhythm and normal heart sounds.   ?No murmur heard.  No edema. ?Pulmonary/Chest: Effort normal and breath sounds normal. No respiratory distress. She has no wheezes. She has no rales.  ?Breast: deferred   ?Abdominal: Soft.  Reducible umbilical hernia.  She exhibits no distension. There is no tenderness.  ?Lymphadenopathy: She has no cervical adenopathy.  ?Skin: Skin is warm and dry. She is not diaphoretic.  ?Psychiatric: She has a normal mood and affect. Her behavior is normal.  ? ? ? ?Lab Results  ?Component Value Date  ? WBC 8.3 04/21/2021  ? HGB 11.7 (L) 04/21/2021  ? HCT 36.9 04/21/2021  ? PLT 231 04/21/2021  ? GLUCOSE 179 (H) 04/21/2021  ? CHOL 120 04/22/2020  ? TRIG 211.0 (H) 04/22/2020  ? HDL 29.40 (L) 04/22/2020  ? LDLDIRECT 64.0 04/22/2020  ? [place] 55 07/20/2016  ? ALT 28 04/21/2021  ? AST 21 04/21/2021  ? NA 138 04/21/2021  ? K 4.8 04/21/2021  ? CL 106 04/21/2021  ? CREATININE 0.77 04/21/2021  ? BUN 14 04/21/2021  ? CO2 28 04/21/2021  ? TSH 0.013 (L) 03/17/2021  ? INR 0.9 10/28/2019  ? HGBA1C 6.9 (H) 10/26/2020  ? MICROALBUR 1.3 04/22/2020  ? ? ? ? ?   ?Assessment & Plan:  ? ?Physical exam: ?Screening blood work  ordered ?Exercise very active ?Weight okay for age ?Substance abuse  none ? ? ?Reviewed recommended immunizations. ? ? ?Health Maintenance  ?Topic Date Due  ? COVID-19 Vaccine (1) Never done  ? FOOT EXAM  Never done  ?  OPHTHALMOLOGY EXAM  Never done  ? URINE MICROALBUMIN  04/22/2021  ? HEMOGLOBIN A1C  04/25/2021  ? Zoster Vaccines- Shingrix (1 of 2) 07/27/2021 (Originally 05/12/1964)  ? DEXA SCAN  04/27/2022 (Originally 03/25/2020)  ? TETANUS/TDAP  02/24/2029  ? Pneumonia Vaccine 100+ Years old  Completed  ? INFLUENZA VACCINE  Completed  ? Hepatitis C Screening  Completed  ? HPV VACCINES  Aged Out  ? COLONOSCOPY (Pts 45-44yr Insurance coverage will need to be confirmed)  Discontinued  ?  ? ? ? ? ? ? ?See Problem List for Assessment and Plan of chronic medical problems. ? ? ? ? ?

## 2021-04-25 NOTE — Patient Instructions (Addendum)
? ? ? ?Medications changes include :   start glimepiride 2 mg daily in the morning. ? ? ?Your prescription(s) have been sent to your pharmacy.  ? ? ? ? ?Return in about 6 months (around 10/27/2021) for follow up. ? ? ? ?Health Maintenance, Female ?Adopting a healthy lifestyle and getting preventive care are important in promoting health and wellness. Ask your health care provider about: ?The right schedule for you to have regular tests and exams. ?Things you can do on your own to prevent diseases and keep yourself healthy. ?What should I know about diet, weight, and exercise? ?Eat a healthy diet ? ?Eat a diet that includes plenty of vegetables, fruits, low-fat dairy products, and lean protein. ?Do not eat a lot of foods that are high in solid fats, added sugars, or sodium. ?Maintain a healthy weight ?Body mass index (BMI) is used to identify weight problems. It estimates body fat based on height and weight. Your health care provider can help determine your BMI and help you achieve or maintain a healthy weight. ?Get regular exercise ?Get regular exercise. This is one of the most important things you can do for your health. Most adults should: ?Exercise for at least 150 minutes each week. The exercise should increase your heart rate and make you sweat (moderate-intensity exercise). ?Do strengthening exercises at least twice a week. This is in addition to the moderate-intensity exercise. ?Spend less time sitting. Even light physical activity can be beneficial. ?Watch cholesterol and blood lipids ?Have your blood tested for lipids and cholesterol at 76 years of age, then have this test every 5 years. ?Have your cholesterol levels checked more often if: ?Your lipid or cholesterol levels are high. ?You are older than 76 years of age. ?You are at high risk for heart disease. ?What should I know about cancer screening? ?Depending on your health history and family history, you may need to have cancer screening at various  ages. This may include screening for: ?Breast cancer. ?Cervical cancer. ?Colorectal cancer. ?Skin cancer. ?Lung cancer. ?What should I know about heart disease, diabetes, and high blood pressure? ?Blood pressure and heart disease ?High blood pressure causes heart disease and increases the risk of stroke. This is more likely to develop in people who have high blood pressure readings or are overweight. ?Have your blood pressure checked: ?Every 3-5 years if you are 78-71 years of age. ?Every year if you are 83 years old or older. ?Diabetes ?Have regular diabetes screenings. This checks your fasting blood sugar level. Have the screening done: ?Once every three years after age 66 if you are at a normal weight and have a low risk for diabetes. ?More often and at a younger age if you are overweight or have a high risk for diabetes. ?What should I know about preventing infection? ?Hepatitis B ?If you have a higher risk for hepatitis B, you should be screened for this virus. Talk with your health care provider to find out if you are at risk for hepatitis B infection. ?Hepatitis C ?Testing is recommended for: ?Everyone born from [birth year] through 1965. ?Anyone with known risk factors for hepatitis C. ?Sexually transmitted infections (STIs) ?Get screened for STIs, including gonorrhea and chlamydia, if: ?You are sexually active and are younger than 76 years of age. ?You are older than 76 years of age and your health care provider tells you that you are at risk for this type of infection. ?Your sexual activity has changed since you were last screened, and you  are at increased risk for chlamydia or gonorrhea. Ask your health care provider if you are at risk. ?Ask your health care provider about whether you are at high risk for HIV. Your health care provider may recommend a prescription medicine to help prevent HIV infection. If you choose to take medicine to prevent HIV, you should first get tested for HIV. You should then be tested  every 3 months for as long as you are taking the medicine. ?Pregnancy ?If you are about to stop having your period (premenopausal) and you may become pregnant, seek counseling before you get pregnant. ?Take 400 to 800 micrograms (mcg) of folic acid every day if you become pregnant. ?Ask for birth control (contraception) if you want to prevent pregnancy. ?Osteoporosis and menopause ?Osteoporosis is a disease in which the bones lose minerals and strength with aging. This can result in bone fractures. If you are 44 years old or older, or if you are at risk for osteoporosis and fractures, ask your health care provider if you should: ?Be screened for bone loss. ?Take a calcium or vitamin D supplement to lower your risk of fractures. ?Be given hormone replacement therapy (HRT) to treat symptoms of menopause. ?Follow these instructions at home: ?Alcohol use ?Do not drink alcohol if: ?Your health care provider tells you not to drink. ?You are pregnant, may be pregnant, or are planning to become pregnant. ?If you drink alcohol: ?Limit how much you have to: ?0-1 drink a day. ?Know how much alcohol is in your drink. In the [place] one drink equals one 12 oz bottle of beer (355 mL), one 5 oz glass of wine (148 mL), or one 1? oz glass of hard liquor (44 mL). ?Lifestyle ?Do not use any products that contain nicotine or tobacco. These products include cigarettes, chewing tobacco, and vaping devices, such as e-cigarettes. If you need help quitting, ask your health care provider. ?Do not use street drugs. ?Do not share needles. ?Ask your health care provider for help if you need support or information about quitting drugs. ?General instructions ?Schedule regular health, dental, and eye exams. ?Stay current with your vaccines. ?Tell your health care provider if: ?You often feel depressed. ?You have ever been abused or do not feel safe at home. ?Summary ?Adopting a healthy lifestyle and getting preventive care are important in promoting  health and wellness. ?Follow your health care provider's instructions about healthy diet, exercising, and getting tested or screened for diseases. ?Follow your health care provider's instructions on monitoring your cholesterol and blood pressure. ?This information is not intended to replace advice given to you by your health care provider. Make sure you discuss any questions you have with your health care provider. ?Document Revised: 06/28/2020 Document Reviewed: 06/28/2020 ?Elsevier Patient Education ? [place]. ?

## 2021-04-26 ENCOUNTER — Other Ambulatory Visit: Payer: Self-pay

## 2021-04-26 ENCOUNTER — Ambulatory Visit (INDEPENDENT_AMBULATORY_CARE_PROVIDER_SITE_OTHER): Payer: Medicare HMO | Admitting: Internal Medicine

## 2021-04-26 VITALS — BP 120/68 | HR 81 | Temp 98.3°F | Ht 65.0 in | Wt 172.2 lb

## 2021-04-26 DIAGNOSIS — E7849 Other hyperlipidemia: Secondary | ICD-10-CM | POA: Diagnosis not present

## 2021-04-26 DIAGNOSIS — E1165 Type 2 diabetes mellitus with hyperglycemia: Secondary | ICD-10-CM

## 2021-04-26 DIAGNOSIS — E038 Other specified hypothyroidism: Secondary | ICD-10-CM | POA: Diagnosis not present

## 2021-04-26 DIAGNOSIS — M8589 Other specified disorders of bone density and structure, multiple sites: Secondary | ICD-10-CM

## 2021-04-26 DIAGNOSIS — Z0000 Encounter for general adult medical examination without abnormal findings: Secondary | ICD-10-CM | POA: Diagnosis not present

## 2021-04-26 DIAGNOSIS — K429 Umbilical hernia without obstruction or gangrene: Secondary | ICD-10-CM | POA: Diagnosis not present

## 2021-04-26 DIAGNOSIS — I700 Atherosclerosis of aorta: Secondary | ICD-10-CM | POA: Diagnosis not present

## 2021-04-26 MED ORDER — GLIMEPIRIDE 2 MG PO TABS: 2.0000 mg | ORAL_TABLET | Freq: Every day | ORAL | 5 refills | Status: DC

## 2021-04-26 NOTE — Assessment & Plan Note (Addendum)
Chronic ?Regular exercise and healthy diet encouraged ?Continue atorvastatin 10 mg daily ?

## 2021-04-26 NOTE — Assessment & Plan Note (Addendum)
Chronic ?Continue atorvastatin 10 mg daily ?Encourage healthy diet and regular exercise ? ?

## 2021-04-26 NOTE — Assessment & Plan Note (Addendum)
Chronic  ?Clinically euthyroid ?Currently taking levothyroxine 150 mcg daily ? ?

## 2021-04-26 NOTE — Assessment & Plan Note (Addendum)
Chronic ?Lab Results  ?Component Value Date  ? HGBA1C 6.9 (H) 10/26/2020  ? ?Sugars  controlled previously, but since the chemo treatment that she had her sugars have been very elevated and are not currently controlled ?Continue metformin XR 1500 mg daily ?Rybelsus was too expensive and she is no longer taking-we will refer to our pharmacist to see if he can get her patient assistance ?We will start glimepiride 2 mg daily in the morning for now they will control her sugars ?Advised her to monitor them carefully ?Our lab is currently not able to do blood work so we will hold off on checking her A1c, urine microalbumin ?Stressed regular exercise, diabetic diet ? ? ?

## 2021-04-26 NOTE — Assessment & Plan Note (Signed)
Chronic ?Tender with pressing on it only ?Stable, not incarcerated ?

## 2021-04-26 NOTE — Assessment & Plan Note (Addendum)
Chronic ?DEXA due-deferred for now-we will consider at next visit ?Stressed importance of regular exercise ?Advised calcium and vitamin D daily ?

## 2021-04-27 ENCOUNTER — Telehealth: Payer: Self-pay | Admitting: Internal Medicine

## 2021-04-27 NOTE — Chronic Care Management (AMB) (Signed)
?  Chronic Care Management  ? ?Outreach Note ? ?04/27/2021 ?Name: Jessica Farrell MRN: [id number] DOB: 1945-07-18 ? ?Referred by: Binnie Rail, MD ?Reason for referral : No chief complaint on file. ? ? ?An unsuccessful telephone outreach was attempted today. The patient was referred to the pharmacist for assistance with care management and care coordination.  ? ?Follow Up Plan:  ? ?Tatjana Dellinger ?Upstream Scheduler  ?

## 2021-04-29 ENCOUNTER — Telehealth: Payer: Self-pay | Admitting: Internal Medicine

## 2021-04-29 NOTE — Progress Notes (Signed)
?  Chronic Care Management  ? ?Outreach Note ? ?04/29/2021 ?Name: Bennett Vanscyoc MRN: [id number] DOB: 1945-06-18 ? ?Referred by: Binnie Rail, MD ?Reason for referral : No chief complaint on file. ? ? ?A second unsuccessful telephone outreach was attempted today. The patient was referred to pharmacist for assistance with care management and care coordination. ? ?Follow Up Plan:  ? ?Tatjana Dellinger ?Upstream Scheduler  ?

## 2021-05-04 ENCOUNTER — Telehealth: Payer: Self-pay | Admitting: Internal Medicine

## 2021-05-04 NOTE — Progress Notes (Signed)
?  Chronic Care Management  ? ?Note ? ?05/04/2021 ?Name: Jessica Farrell MRN: [id number] DOB: January 09, 1946 ? ?Jessica Farrell is a 76 y.o. year old female who is a primary care patient of Burns, Claudina Lick, MD. I reached out to Jessica Farrell by phone today in response to a referral sent by Jessica Farrell's PCP, Binnie Rail, MD.  ? ?Jessica Farrell was given information about Chronic Care Management services today including:  ?CCM service includes personalized support from designated clinical staff supervised by her physician, including individualized plan of care and coordination with other care providers ?24/7 contact phone numbers for assistance for urgent and routine care needs. ?Service will only be billed when office clinical staff spend 20 minutes or more in a month to coordinate care. ?Only one practitioner may furnish and bill the service in a calendar month. ?The patient may stop CCM services at any time (effective at the end of the month) by phone call to the office staff. ? ? ?Patient agreed to services and verbal consent obtained.  ? ?Follow up plan: ? ? ?Tatjana Farrell ?Upstream Scheduler  ?

## 2021-05-12 ENCOUNTER — Other Ambulatory Visit: Payer: Self-pay | Admitting: Internal Medicine

## 2021-05-17 NOTE — Progress Notes (Signed)
? ? ?Subjective:  ? ? Patient ID: Jessica Farrell, female    DOB: [birth year], 76 y.o.   MRN: [id number] ? ?This visit occurred during the SARS-CoV-2 public health emergency.  Safety protocols were in place, including screening questions prior to the visit, additional usage of staff PPE, and extensive cleaning of exam room while observing appropriate contact time as indicated for disinfecting solutions. ? ? ? ?HPI ?Linah is here for  ?Chief Complaint  ?Patient presents with  ? Fall  ?  Right knee pain and swelling; Pain with walkig. Fall x 2 weeks ago/landed on knee  ? ? ? ?She fell outside 1 week ago-has had right leg pain since then.  She heard a pop when she fell.  She has pain on the medial aspect of right knee.  It swelled a little, but it has improved.  No bruising.  Walking is painful and she has a mild pain at rest.  ? ?Aleve relieves the pain. ? ? ? ? ? ?Medications and allergies reviewed with patient and updated if appropriate. ? ?Current Outpatient Medications on File Prior to Visit  ?Medication Sig Dispense Refill  ? albuterol (VENTOLIN HFA) 108 (90 Base) MCG/ACT inhaler INHALE 2 PUFFS INTO THE LUNGS EVERY 6 (SIX) HOURS AS NEEDED FOR WHEEZING OR SHORTNESS OF BREATH. 54 g 1  ? atorvastatin (LIPITOR) 10 MG tablet TAKE 1 TABLET EVERY DAY 90 tablet 1  ? cetirizine (ZYRTEC) 10 MG tablet Take 1 tablet (10 mg total) by mouth daily. 15 tablet 0  ? erythromycin ophthalmic ointment 3 (three) times daily.    ? fluticasone-salmeterol (ADVAIR DISKUS) 250-50 MCG/ACT AEPB Inhale 1 puff into the lungs in the morning and at bedtime. 60 each 5  ? gabapentin (NEURONTIN) 100 MG capsule Take 1 capsule (100 mg total) by mouth at bedtime. 90 capsule 1  ? glimepiride (AMARYL) 2 MG tablet Take 1 tablet (2 mg total) by mouth daily before breakfast. 30 tablet 5  ? glucose blood test strip Based on insurance preference. Use as instructed once daily to check sugars. Dx E11.9 100 each 12  ? Lancets (ONETOUCH ULTRASOFT) lancets Use  as instructed once daily to check sugars. Dx E11.9 100 each 12  ? levothyroxine (SYNTHROID) 150 MCG tablet Take 1 tablet (150 mcg total) by mouth daily. 90 tablet 3  ? loperamide (IMODIUM) 1 MG/5ML solution Take by mouth as needed for diarrhea or loose stools.    ? metFORMIN (GLUCOPHAGE-XR) 750 MG 24 hr tablet TAKE 2 TABLETS EVERY DAY WITH SUPPER (NEW DOSE) 180 tablet 1  ? montelukast (SINGULAIR) 10 MG tablet TAKE 1 TABLET AT BEDTIME 90 tablet 1  ? naproxen (NAPROSYN) 375 MG tablet Take 1 tablet (375 mg total) by mouth 2 (two) times daily. 20 tablet 0  ? Prenatal Vit-Fe Fumarate-FA (PRENATAL VITAMINS PO) Take by mouth.    ? ?No current facility-administered medications on file prior to visit.  ? ? ?Review of Systems ? ?   ?Objective:  ? ?Vitals:  ? 05/18/21 1018  ?BP: 136/64  ?Pulse: 83  ?Temp: 98.2 ?F (36.8 ?C)  ?SpO2: 97%  ? ?BP Readings from Last 3 Encounters:  ?05/18/21 136/64  ?04/26/21 120/68  ?04/21/21 129/62  ? ?Wt Readings from Last 3 Encounters:  ?05/18/21 175 lb (79.4 kg)  ?04/26/21 172 lb 3.2 oz (78.1 kg)  ?04/21/21 173 lb 12.8 oz (78.8 kg)  ? ?Body mass index is 29.12 kg/m?. ? ?  ?Physical Exam ?   ?A right knee exam was  performed.  ? ?SWELLING: no  ?EFFUSION: yes  ?WARMTH: no warmth  ?TENDERNESS: mild tenderness on medial lower aspect of knee  ?ROM: full extension, full flexion  ?GAIT: walks with a limp ?NEUROLOGICAL EXAM: normal sensation  ?CALF TENDERNESS: no  ? ? ? ? ? ?Assessment & Plan:  ? ? ?See Problem List for Assessment and Plan of chronic medical problems.  ? ? ? ? ?

## 2021-05-18 ENCOUNTER — Ambulatory Visit (INDEPENDENT_AMBULATORY_CARE_PROVIDER_SITE_OTHER): Payer: Medicare HMO | Admitting: Internal Medicine

## 2021-05-18 ENCOUNTER — Encounter: Payer: Self-pay | Admitting: Internal Medicine

## 2021-05-18 VITALS — BP 136/64 | HR 83 | Temp 98.2°F | Ht 65.0 in | Wt 175.0 lb

## 2021-05-18 DIAGNOSIS — M25561 Pain in right knee: Secondary | ICD-10-CM

## 2021-05-18 NOTE — Assessment & Plan Note (Signed)
Acute ?Started 2 weeks ago after a fall in her yard ?Pain on medial aspect of right knee-she heard a pop when she fell and has had pain since ?Likely medial meniscus tear ?Refer to orthopedics for further evaluation ?Continue Aleve, which is giving her relief of her pain-take with food ? ?

## 2021-05-18 NOTE — Patient Instructions (Addendum)
? ?  ? ? ?  Medications changes include :   keep taking aleve for your pain. ? ? ? ?A referral was ordered for orthopedics - [facility].     Someone from that office will call you to schedule an appointment.  ? ? ?Return if symptoms worsen or fail to improve. ? ? ? ? ?

## 2021-05-20 ENCOUNTER — Ambulatory Visit: Payer: Medicare HMO | Admitting: Family

## 2021-05-20 ENCOUNTER — Ambulatory Visit (INDEPENDENT_AMBULATORY_CARE_PROVIDER_SITE_OTHER): Payer: Medicare HMO

## 2021-05-20 DIAGNOSIS — M25561 Pain in right knee: Secondary | ICD-10-CM

## 2021-05-20 NOTE — Progress Notes (Signed)
? ?Office Visit Note ?  ?Patient: Jessica Farrell           ?Date of Birth: 1945/05/16           ?MRN: [id number] ?Visit Date: 05/20/2021 ?             ?Requested by: Binnie Rail, MD ?[place],  [place] [id number] ?PCP: Binnie Rail, MD ? ?Chief Complaint  ?Patient presents with  ? Right Knee - Pain  ? ? ? ? ?HPI: ?The patient is a 76 year old woman who states that she was walking in her yard in the dark about 2 weeks ago when she stepped on a stick and had a fall she heard a pop in her right knee and has been having significant medial knee pain since this is made worse with weightbearing she did initially have some edema she has been using a knee sleeve with modest improvement she also uses Aleve which eases her pain some. ? ?No locking or catching ? ?Assessment & Plan: ?Visit Diagnoses:  ?1. Acute pain of right knee   ? ? ?Plan: Concern for medial meniscus injury will send for MRI for surgical planning.  Patient deferred a Depo-Medrol injection of her knee today. ?Follow-Up Instructions: No follow-ups on file.  ? ?Right Knee Exam  ? ?Muscle Strength  ?The patient has normal right knee strength. ? ?Tenderness  ?The patient is experiencing tenderness in the medial joint line. ? ?Range of Motion  ?The patient has normal right knee ROM. ? ?Tests  ?Varus: negative Valgus: negative ? ?Other  ?Erythema: absent ?Swelling: mild ?Effusion: no effusion present ? ? ? ? ?Patient is alert, oriented, no adenopathy, well-dressed, normal affect, normal respiratory effort. ? ? ?Imaging: ?XR Knee 1-2 Views Right ? ?Result Date: 05/20/2021 ?Of the right knee show mild to moderate joint space narrowing there is no osteophytic spurring.  No acute finding.  ?No images are attached to the encounter. ? ?Labs: ?Lab Results  ?Component Value Date  ? HGBA1C 6.9 (H) 10/26/2020  ? HGBA1C 7.4 (H) 07/26/2020  ? HGBA1C 7.8 (H) 04/22/2020  ? ESRSEDRATE 37 (H) 03/17/2021  ? ESRSEDRATE 29 (H) 01/14/2014  ? CRP 0.7 03/17/2021  ? CRP  <0.5 01/14/2014  ? REPTSTATUS 02/03/2014 FINAL 02/02/2014  ? CULT NO GROWTH ?Performed at [facility] ? 02/02/2014  ? LABORGA  02/18/2016  ?  Three or more organisms present,each greater than ?10,000 CFU/mL.These organisms,commonly found on ?external and internal genitalia,are considered to ?be colonizers.No further testing performed. ?  ? ? ? ?Lab Results  ?Component Value Date  ? ALBUMIN 3.7 04/21/2021  ? ALBUMIN 3.7 03/17/2021  ? ALBUMIN 3.7 02/08/2021  ? PREALBUMIN 11.4 (L) 02/03/2014  ? ? ?No results found for: MG ?No results found for: VD25OH ? ?Lab Results  ?Component Value Date  ? PREALBUMIN 11.4 (L) 02/03/2014  ? ? ?  Latest Ref Rng & Units 04/21/2021  ?  9:15 AM 03/17/2021  ?  4:28 PM 02/08/2021  ?  8:12 AM  ?CBC EXTENDED  ?WBC 4.0 - 10.5 K/uL 8.3   9.2   6.6    ?RBC 3.87 - 5.11 MIL/uL 4.21   4.61   4.19    ?Hemoglobin 12.0 - 15.0 g/dL 11.7   13.0   11.7    ?HCT 36.0 - 46.0 % 36.9   40.5   36.4    ?Platelets 150 - 400 K/uL 231   283   239    ?NEUT#  1.7 - 7.7 K/uL 4.8   6.5   4.2    ?Lymph# 0.7 - 4.0 K/uL 2.6   2.0   1.8    ? ? ? ?There is no height or weight on file to calculate BMI. ? ?Orders:  ?Orders Placed This Encounter  ?Procedures  ? XR Knee 1-2 Views Right  ? MR Knee Right w/o contrast  ? ?No orders of the defined types were placed in this encounter. ? ? ? Procedures: ?No procedures performed ? ?Clinical Data: ?No additional findings. ? ?ROS: ? ?All other systems negative, except as noted in the HPI. ?Review of Systems ? ?Objective: ?Vital Signs: There were no vitals taken for this visit. ? ?Specialty Comments:  ?No specialty comments available. ? ?PMFS History: ?Patient Active Problem List  ? Diagnosis Date Noted  ? Acute pain of right knee 05/18/2021  ? Aortic atherosclerosis ([place]) 04/22/2020  ? Tingling in extremities 05/23/2018  ? Osteopenia 03/27/2018  ? Umbilical hernia without obstruction or gangrene 11/21/2017  ? Onychomycosis 05/22/2017  ? Athlete's foot 05/22/2017  ? Carcinoid tumor  of cecum 01/26/2017  ? Cough 06/06/2016  ? 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 Chronic venous insufficiency 08/02/2012  ? HLD (hyperlipidemia) 05/15/2012  ? Diabetes mellitus ([place]) 05/05/2012  ? Hypothyroidism 05/05/2012  ? Asthma 05/05/2012  ? ?Past Medical History:  ?Diagnosis Date  ? Asthma   ? Cellulitis March  2014  ? Left foot and ankle  ? colon ca dx'd 01/2014  ? Colon polyps   ? 2015   ? Diabetes mellitus ([place]) 05/05/2012  ? Type II  ? Hyperlipidemia   ? Morbid obesity ([place])   ? Thyroid disease 1990's  ? HypoThyroidism  ?  ?Family History  ?Problem Relation Age of Onset  ? Alzheimer's disease Father   ? Heart disease Mother   ? Arthritis Mother   ? Hypertension Mother   ? Alzheimer's disease Sister   ? Cancer Sister 50  ?     breast cancer   ? Heart disease Brother   ?     Heart Disease before age 12  ? Breast cancer Sister   ? Colon cancer Neg Hx   ? Esophageal cancer Neg Hx   ? Rectal cancer Neg Hx   ? Stomach cancer Neg Hx   ?  ?Past Surgical History:  ?Procedure Laterality Date  ? CHOLECYSTECTOMY N/A 05/02/2017  ? Procedure: LAPAROSCOPIC CHOLECYSTECTOMY WITH INTRAOPERATIVE CHOLANGIOGRAM, VENTRAL HERNIA REPAIR;  Surgeon: Jovita Kussmaul, MD;  Location: [facility];  Service: General;  Laterality: N/A;  ? COLON RESECTION N/A 02/03/2014  ? Procedure: LAPAROSCOPIC ASSISTED BOWEL RESECTION;  Surgeon: Jackolyn Confer, MD;  Location: [facility];  Service: General;  Laterality: N/A;  ? FRACTURE SURGERY  2000  ? Ankle  ? TOOTH EXTRACTION    ? wisdom Teeth  ? ?Social History  ? ?Occupational History  ? Not on file  ?Tobacco Use  ? Smoking status: Never  ? Smokeless tobacco: Never  ?Vaping Use  ? Vaping Use: Never used  ?Substance and Sexual Activity  ? Alcohol use: No  ? Drug use: No  ? Sexual activity: Not Currently  ?  Birth control/protection: Post-menopausal  ? ? ? ? ? ? ?

## 2021-05-26 ENCOUNTER — Telehealth: Payer: Self-pay | Admitting: Orthopedic Surgery

## 2021-05-26 NOTE — Telephone Encounter (Signed)
LMOM for pt to return call to sch MRI right knee review with Dr. Sharol Given after 05/30/21 ?

## 2021-05-30 ENCOUNTER — Ambulatory Visit
Admission: RE | Admit: 2021-05-30 | Discharge: 2021-05-30 | Disposition: A | Discharge status: Home / Self Care | Payer: Medicare HMO | Source: Ambulatory Visit | Attending: Family | Admitting: Family

## 2021-05-30 DIAGNOSIS — M25561 Pain in right knee: Secondary | ICD-10-CM | POA: Diagnosis not present

## 2021-05-30 IMAGING — MR MR KNEE*R* W/O CM
4 of 6 series · 17 of 40 positions shown · non-contrast
Comparison: X-ray knee [DATE].

CLINICAL DATA: Meniscal injury, knee. Acute right knee pain with a
pop related to a fall.

EXAM:
MRI OF THE RIGHT KNEE WITHOUT CONTRAST
TECHNIQUE: Multiplanar, multisequence MR imaging of the knee was performed. No
intravenous contrast was administered.

[Series 3: T2 fat-sat · axial · 4.0mm · 0.29mm/px · z∈[-37,+51]mm · 3 of 25 slices shown (1 of 2)]
[im 5/25]
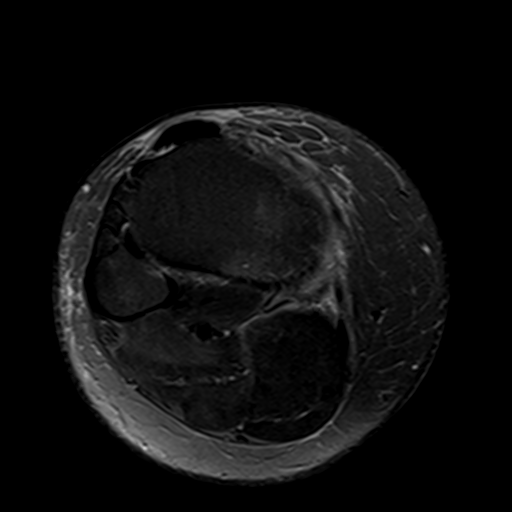
[im 15/25]
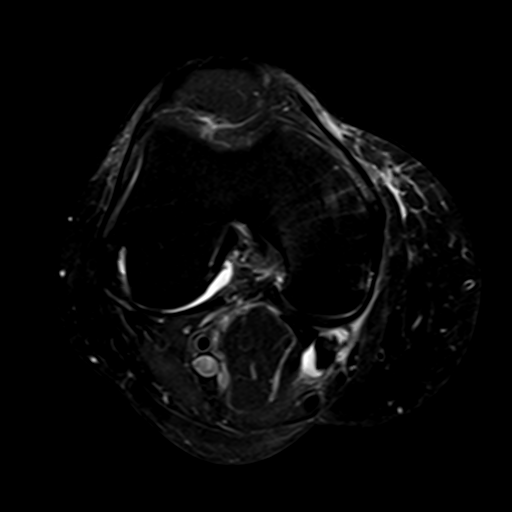
[im 25/25]
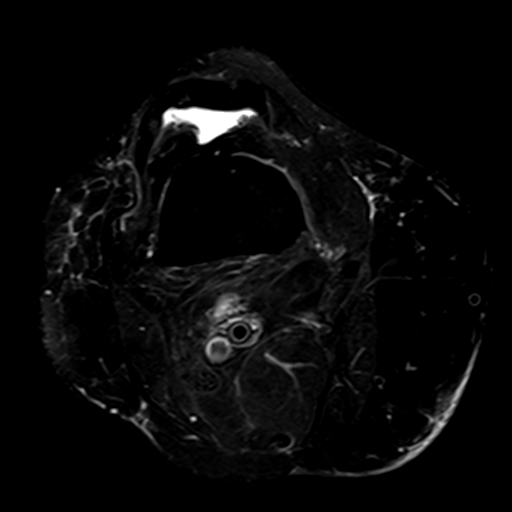

[Series 5: T2 fat-sat · coronal · 4.0mm · 0.29mm/px · 3 of 24 slices shown (2 of 2)]
[im 5/24]
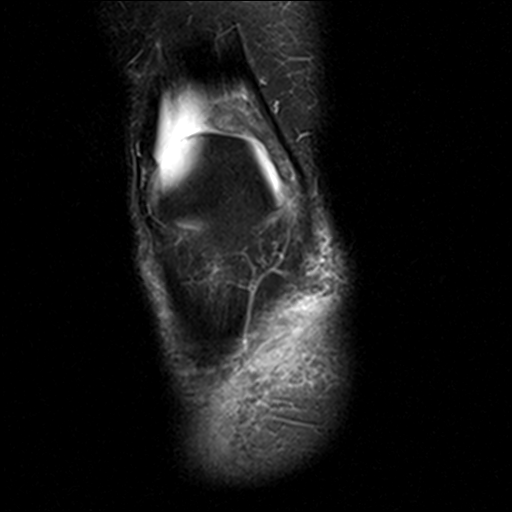
[im 14/24]
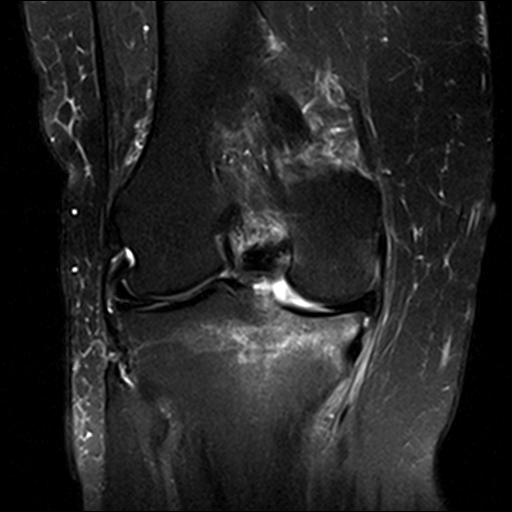
[im 24/24]
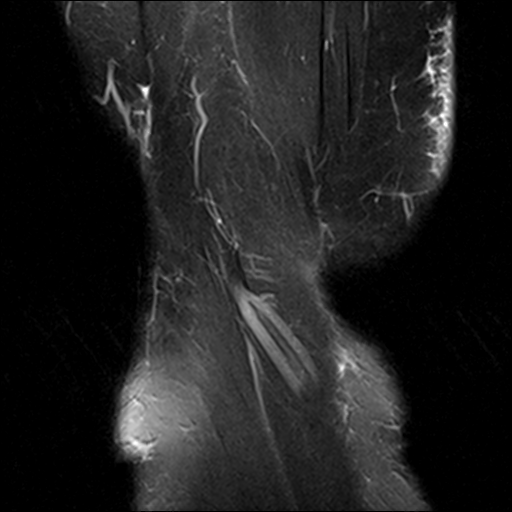

[Series 6: PD fat-sat · coronal · 3.0mm · 0.29mm/px · 8 of 32 slices shown (1 of 2)]
[im 1/32]
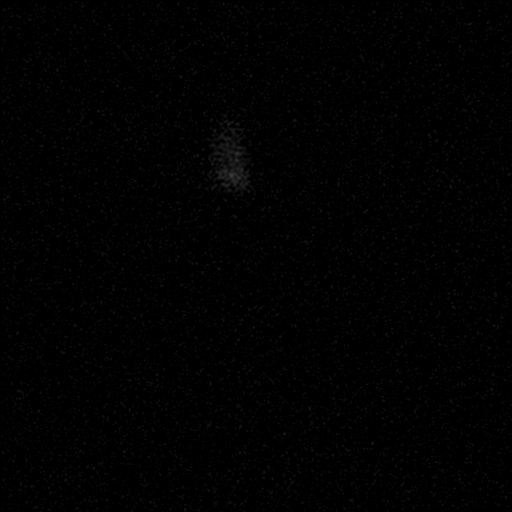
[im 5/32]
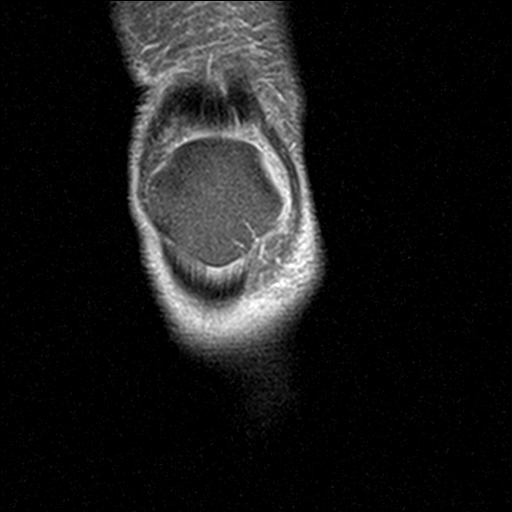
[im 9/32]
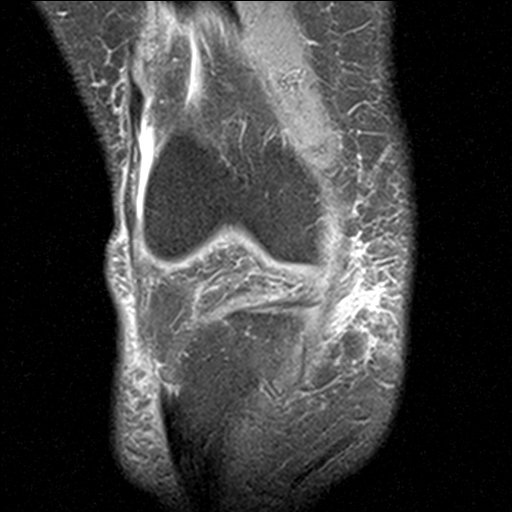
[im 14/32]
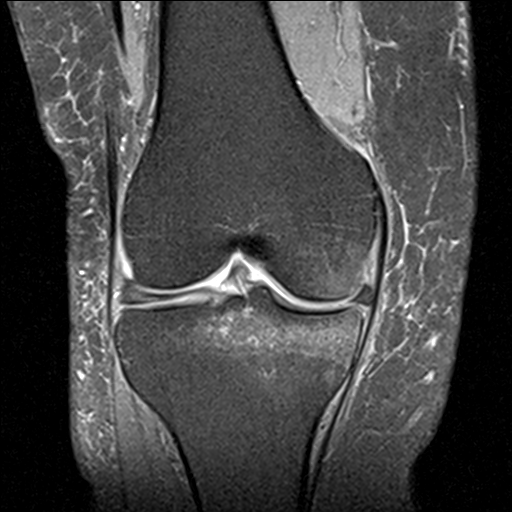
[im 18/32]
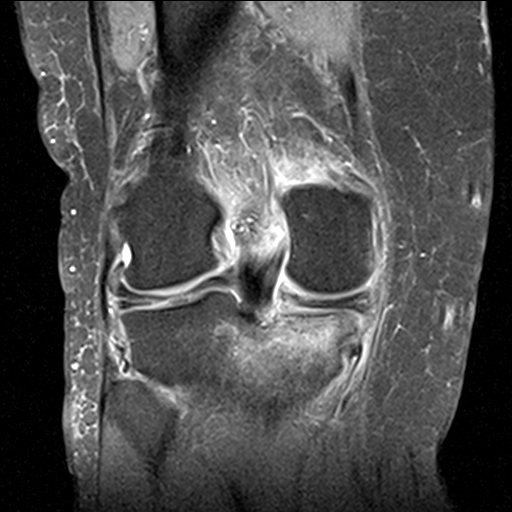
[im 23/32]
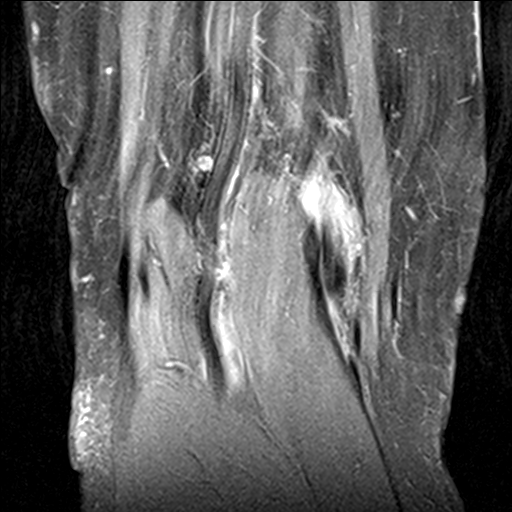
[im 27/32]
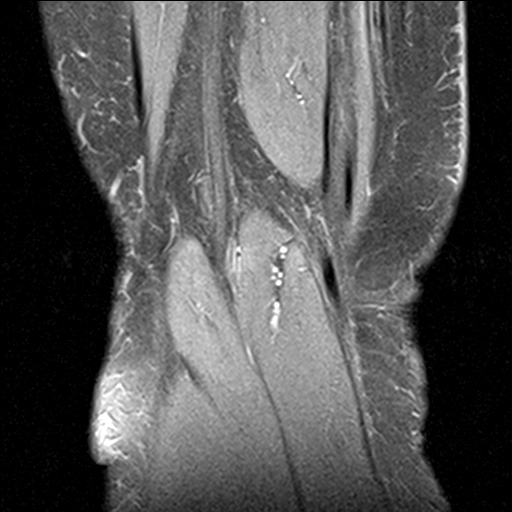
[im 32/32]
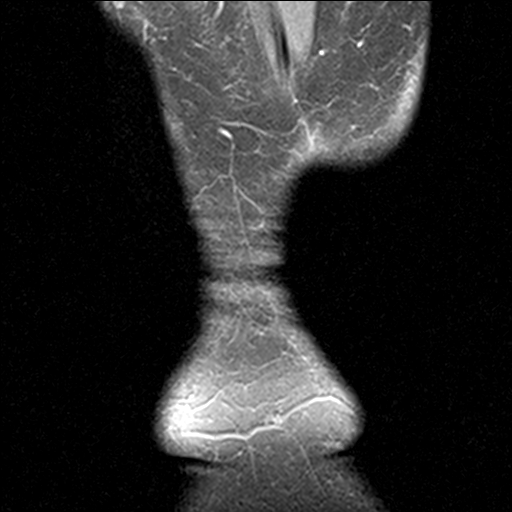

[Series 7: PD fat-sat · sagittal · 3.0mm · 0.29mm/px · 3 of 27 slices shown (2 of 2)]
[im 5/27]
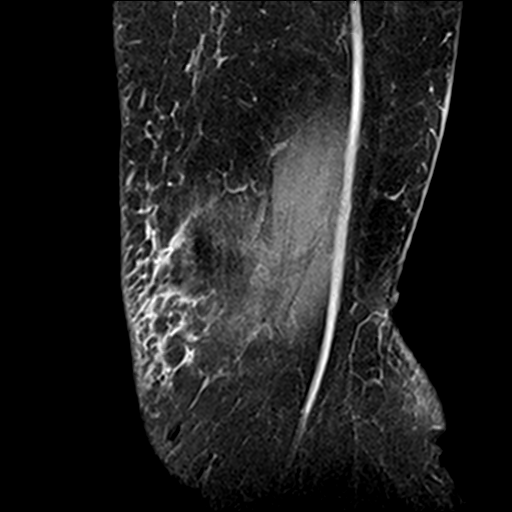
[im 14/27]
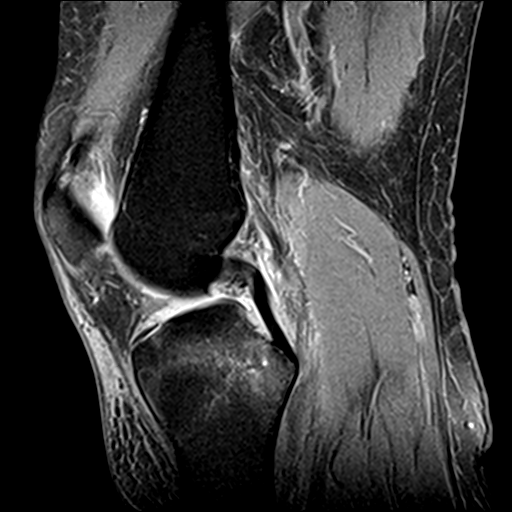
[im 22/27]
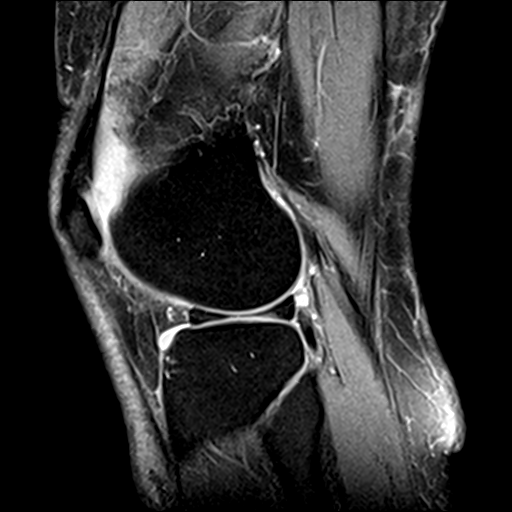

[17 of 40 positions shown; findings below may reference images not displayed]

FINDINGS: MENISCI

Medial meniscus: There is increased proton density signal throughout
the 7 mm of the posterior horn of the medial meniscus closer to the
root (sagittal image 17 and coronal image 14), a focal complex tear
of extension through the posterior wall and inferior articular
surface of the meniscal triangle. Mild extrusion of the body of the
medial meniscus with mild intermediate proton density signal
intrasubstance degeneration.

Lateral meniscus: There is oblique linear increased proton density
signal throughout the majority of the body of the lateral meniscus
(coronal series 6 images 15 through 19) extending through the
lateral wall and contacting the inferior articular surface of the
central third of the meniscal triangle on a single coronal image
only (coronal image 15). This comes close to the articular surfaces
of the central third of meniscal triangle within the more anterior
aspect of the body of the lateral meniscus without definite
extending through the articular surfaces. There is also a small
intersecting linear oblique tear that appears to extend through the
superior articular surface of the middle third of the meniscal
triangle on coronal image 18. Mild extrusion of the body of the
lateral meniscus.

LIGAMENTS

Cruciates: The ACL and PCL are intact.

Collaterals: The medial collateral ligament is intact. The fibular
collateral ligament, biceps femoris tendon, iliotibial band, and
popliteus tendon are intact.

CARTILAGE

Patellofemoral:  Intact.

Medial: Mild thinning of the medial aspect of the weight-bearing
medial femoral condyle and medial tibial plateau cartilage.

Lateral: Mild thinning of the lateral aspect of the weight-bearing
lateral femoral condyle and lateral tibial plateau cartilage as well
as the far medial aspect of the weight-bearing lateral femoral
condyle cartilage.

Joint: Mild-to-moderatejoint effusion. Normal Hoffa's fat pad. No
plical thickening.

Popliteal Fossa:  Small

Extensor Mechanism:  Intact quadriceps tendon and patellar tendon.

Bones: There is moderate to high-grade marrow edema surrounding
horizontal linear decreased T1 and decreased T2 signal within the
posterior medial tibial plateau indicating a nondisplaced acute to
series 8 images 17 through 20). No overlying cortical depression.
The marrow edema also extends deep to the tibial spines and mildly
into the lateral tibial plateau.

Other: None.
IMPRESSION: :
IMPRESSION: 1. Acute to subacute nondisplaced fracture of the posterior medial
tibial plateau. No overlying cortical depression.
2. Mild-to-moderate joint effusion.
3. Complex tear of the root of the posterior horn of the medial
meniscus.
4. Two regions of linear signal within the body of the lateral
meniscus which may represent tiny tears contacting the inferior
articular surface of the central third of the meniscal triangle and
the superior articular surface of the middle third of the meniscal
triangle.

## 2021-05-31 ENCOUNTER — Telehealth: Payer: Self-pay | Admitting: Orthopedic Surgery

## 2021-05-31 NOTE — Telephone Encounter (Signed)
Called patient left message to return call to schedule an appointment for MRI review with Dr. Sharol Given  per Junie Panning  ?

## 2021-05-31 NOTE — Progress Notes (Signed)
Will you offer mri review with md

## 2021-06-09 ENCOUNTER — Telehealth: Payer: Self-pay | Admitting: Internal Medicine

## 2021-06-09 NOTE — Telephone Encounter (Signed)
Left message for patient to call back and schedule Medicare Annual Wellness Visit (AWV) to be completed by video or phone.  ? ?Last AWV: due  05/22/2015 awvs per [place] ? ? ?Please schedule at anytime with [facility]    ? ? ?30 minute appointment ? ?Any questions, please contact me at [phone] ? ?

## 2021-06-13 ENCOUNTER — Ambulatory Visit (INDEPENDENT_AMBULATORY_CARE_PROVIDER_SITE_OTHER): Payer: Medicare HMO | Admitting: Orthopedic Surgery

## 2021-06-13 DIAGNOSIS — M25561 Pain in right knee: Secondary | ICD-10-CM | POA: Diagnosis not present

## 2021-06-13 DIAGNOSIS — S82134A Nondisplaced fracture of medial condyle of right tibia, initial encounter for closed fracture: Secondary | ICD-10-CM | POA: Diagnosis not present

## 2021-06-20 ENCOUNTER — Telehealth: Payer: Self-pay

## 2021-06-20 NOTE — Progress Notes (Signed)
? ? ?Chronic Care Management ?Pharmacy Assistant  ? ?Name: [name]  MRN: [id number] DOB: 1945/07/12 ? ?Jessica Farrell is an 76 y.o. year old female who presents for his initial CCM visit with the clinical pharmacist. ? ? ?Recent office visits:  ?05/18/21 Binnie Rail, MD-PCP (Acute pain of right knee) referral was ordered for orthopedics - [facility]; Medications changes include : keep taking aleve for your pain ? ?04/26/21 Binnie Rail, MD-PCP (Annual exam) Orders: AMB Referral to [facility]; Medication changes: start glimepiride 2 mg daily in the morning  ? ?Recent consult visits:  ?06/13/21 Duda,Marcus V,MD-Orthopedic Surgery (Right Knee) [place] ? ?05/20/21 Dondra Prader R, NP-Orthopedic surgery (acute pain of right knee) XR Knee 1-2 Views Right, MR Knee Right w/o contrast; No medication changes ? 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 ?Hospital visits:  ?Medication Reconciliation was completed by comparing discharge summary, patient?s EMR and Pharmacy list, and upon discussion with patient. ? ?Admitted to the hospital on 03/17/21 due to eye problem. Discharge date was 03/18/21. Discharged from [facility] ED  ? ?New?Medications Started at [facility] Discharge:?? ?-started Gadobutrol 80mol/ml ?  ?Medications that remain the same after Hospital Discharge:??  ?-All other medications will remain the same.   ? ?Medications: ?Outpatient Encounter Medications as of 06/20/2021  ?Medication Sig  ? albuterol (VENTOLIN HFA) 108 (90 Base) MCG/ACT inhaler INHALE 2 PUFFS INTO THE LUNGS EVERY 6 (SIX) HOURS AS NEEDED FOR WHEEZING OR SHORTNESS OF BREATH.  ? atorvastatin (LIPITOR) 10 MG tablet TAKE 1 TABLET EVERY DAY  ? cetirizine (ZYRTEC) 10 MG tablet  Take 1 tablet (10 mg total) by mouth daily.  ? erythromycin ophthalmic ointment 3 (three) times daily.  ? fluticasone-salmeterol (ADVAIR DISKUS) 250-50 MCG/ACT AEPB Inhale 1 puff into the lungs in the morning and at bedtime.  ? gabapentin (NEURONTIN) 100 MG capsule Take 1 capsule (100 mg total) by mouth at bedtime.  ? glimepiride (AMARYL) 2 MG tablet Take 1 tablet (2 mg total) by mouth daily before breakfast.  ? glucose blood test strip Based on insurance preference. Use as instructed once daily to check sugars. Dx E11.9  ? Lancets (ONETOUCH ULTRASOFT) lancets Use as instructed once daily to check sugars. Dx E11.9  ? levothyroxine (SYNTHROID) 150 MCG tablet Take 1 tablet (150 mcg total) by mouth daily.  ? loperamide (IMODIUM) 1 MG/5ML solution Take by mouth as needed for diarrhea or loose stools.  ? metFORMIN (GLUCOPHAGE-XR) 750 MG 24 hr tablet TAKE 2 TABLETS EVERY DAY WITH SUPPER (NEW DOSE)  ? montelukast (SINGULAIR) 10 MG tablet TAKE 1 TABLET AT BEDTIME  ? naproxen (NAPROSYN) 375 MG tablet Take 1 tablet (375 mg total) by mouth 2 (two) times daily.  ? Prenatal Vit-Fe Fumarate-FA (PRENATAL VITAMINS PO) Take by mouth.  ? ?No facility-administered encounter medications on file as of 06/20/2021.  ? ?Medication List: ?albuterol (VENTOLIN HFA) 108 (90 Base) MCG/ACT inhaler-last fill 04/22/21 75 ds ?atorvastatin (LIPITOR) 10 MG tablet-last fill 05/02/21 90 ds ?cetirizine (ZYRTEC) 10 MG tablet ?erythromycin ophthalmic ointment ?fluticasone-salmeterol (ADVAIR DISKUS) 250-50 MCG/ACT AEPB-last fill 01/02/21 30 ds ?gabapentin (NEURONTIN) 100 MG capsule-last fill 09/15/20 90 ds ?glimepiride (AMARYL) 2 MG tablet-last fill 05/24/21 30 ds ?glucose blood test strip ?Lancets (ONETOUCH ULTRASOFT) lancets ?levothyroxine (SYNTHROID) 150 MCG tablet-last fill 04/03/21 90 ds ?loperamide (  IMODIUM) 1 MG/5ML solution ?metFORMIN (GLUCOPHAGE-XR) 750 MG 24 hr tablet-last fill 05/13/21 ?montelukast (SINGULAIR) 10 MG tablet-last fill 05/02/21 90  ds ?naproxen (NAPROSYN) 375 MG tablet-last fill 01/18/20 10 ds ?Prenatal Vit-Fe Fumarate-FA (PRENATAL VITAMINS PO) ? ?Care Gaps: ?Colonoscopy-NA ?Diabetic Foot Exam-NA ?Mammogram-NA ?Ophthalmology-NA ?Dexa Scan - 03/25/18 ?Annual Well Visit - 04/26/21 ?Micro albumin-04/22/20 ?Hemoglobin A1c- 10/26/20 ? ?Star Rating Drugs: ?Metformin 750 mg-last fill 05/13/21 90 ds ?Atorvastatin 10 mg-last fill 05/02/21 90 ds ? ?Ethelene Hal ?Clinical Pharmacist Assistant ?[phone]  ?

## 2021-06-21 ENCOUNTER — Telehealth: Payer: Medicare HMO

## 2021-06-26 ENCOUNTER — Encounter: Payer: Self-pay | Admitting: Orthopedic Surgery

## 2021-06-26 NOTE — Progress Notes (Signed)
? ?Office Visit Note ?  ?Patient: Jessica Farrell           ?Date of Birth: 1946-01-27           ?MRN: [id number] ?Visit Date: 06/13/2021 ?             ?Requested by: Binnie Rail, MD ?[place] [place],  [place] [address] ?PCP: Binnie Rail, MD ? ?Chief Complaint  ?Patient presents with  ? Right Knee - Follow-up  ?  MRI review   ? ? ? ? ?HPI: ?Patient is a 76 year old woman who states that she works on her feet a lot she denies any knee pain at this time. ? ?Assessment & Plan: ?Visit Diagnoses:  ?1. Acute pain of right knee   ?2. Closed nondisplaced fracture of medial condyle of right tibia, initial encounter   ? ? ?Plan: Recommend patient repeat her bone density study.  She has had this in the past recommended vitamin D3 5000 international units a day.  Weightbearing as tolerated. ? ?Follow-Up Instructions: Return in about 3 weeks (around 07/04/2021).  ? ?Ortho Exam ? ?Patient is alert, oriented, no adenopathy, well-dressed, normal affect, normal respiratory effort. ?Examination MRI scan shows a stress fracture of the medial tibial plateau there is no depression or displacement.  Patient has no pain to palpation of the medial joint line collaterals and cruciates are stable.  The medial joint line pain has resolved with Aleve. ? ?Imaging: ?No results found. ?No images are attached to the encounter. ? ?Labs: ?Lab Results  ?Component Value Date  ? HGBA1C 6.9 (H) 10/26/2020  ? HGBA1C 7.4 (H) 07/26/2020  ? HGBA1C 7.8 (H) 04/22/2020  ? ESRSEDRATE 37 (H) 03/17/2021  ? ESRSEDRATE 29 (H) 01/14/2014  ? CRP 0.7 03/17/2021  ? CRP <0.5 01/14/2014  ? REPTSTATUS 02/03/2014 FINAL 02/02/2014  ? CULT NO GROWTH ?Performed at [facility] ? 02/02/2014  ? LABORGA  02/18/2016  ?  Three or more organisms present,each greater than ?10,000 CFU/mL.These organisms,commonly found on ?external and internal genitalia,are considered to ?be colonizers.No further testing performed. ?  ? ? ? ?Lab Results  ?Component Value Date  ?  ALBUMIN 3.7 04/21/2021  ? ALBUMIN 3.7 03/17/2021  ? ALBUMIN 3.7 02/08/2021  ? PREALBUMIN 11.4 (L) 02/03/2014  ? ? ?No results found for: MG ?No results found for: VD25OH ? ?Lab Results  ?Component Value Date  ? PREALBUMIN 11.4 (L) 02/03/2014  ? ? ?  Latest Ref Rng & Units 04/21/2021  ?  9:15 AM 03/17/2021  ?  4:28 PM 02/08/2021  ?  8:12 AM  ?CBC EXTENDED  ?WBC 4.0 - 10.5 K/uL 8.3   9.2   6.6    ?RBC 3.87 - 5.11 MIL/uL 4.21   4.61   4.19    ?Hemoglobin 12.0 - 15.0 g/dL 11.7   13.0   11.7    ?HCT 36.0 - 46.0 % 36.9   40.5   36.4    ?Platelets 150 - 400 K/uL 231   283   239    ?NEUT# 1.7 - 7.7 K/uL 4.8   6.5   4.2    ?Lymph# 0.7 - 4.0 K/uL 2.6   2.0   1.8    ? ? ? ?There is no height or weight on file to calculate BMI. ? ?Orders:  ?No orders of the defined types were placed in this encounter. ? ?No orders of the defined types were placed in this encounter. ? ? ? Procedures: ?No procedures  performed ? ?Clinical Data: ?No additional findings. ? ?ROS: ? ?All other systems negative, except as noted in the HPI. ?Review of Systems ? ?Objective: ?Vital Signs: There were no vitals taken for this visit. ? ?Specialty Comments:  ?No specialty comments available. ? ?PMFS History: ?Patient Active Problem List  ? Diagnosis Date Noted  ? Acute pain of right knee 05/18/2021  ? Aortic atherosclerosis ([place]) 04/22/2020  ? Tingling in extremities 05/23/2018  ? Osteopenia 03/27/2018  ? Umbilical hernia without obstruction or gangrene 11/21/2017  ? Onychomycosis 05/22/2017  ? Athlete's foot 05/22/2017  ? 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 Cough 06/06/2016  ? 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 Chronic venous insufficiency 08/02/2012  ? HLD (hyperlipidemia) 05/15/2012  ? Diabetes mellitus ([place]) 05/05/2012  ? Hypothyroidism 05/05/2012  ? Asthma 05/05/2012  ? ?Past Medical History:  ?Diagnosis Date  ? Asthma   ? Cellulitis March  2014  ? Left foot and ankle  ? colon ca dx'd 01/2014  ? Colon polyps   ?  2015   ? Diabetes mellitus ([place]) 05/05/2012  ? Type II  ? Hyperlipidemia   ? Morbid obesity ([place])   ? Thyroid disease 1990's  ? HypoThyroidism  ?  ?Family History  ?Problem Relation Age of Onset  ? Alzheimer's disease Father   ? Heart disease Mother   ? Arthritis Mother   ? Hypertension Mother   ? Alzheimer's disease Sister   ? Cancer Sister 45  ?     breast cancer   ? Heart disease Brother   ?     Heart Disease before age 50  ? Breast cancer Sister   ? Colon cancer Neg Hx   ? Esophageal cancer Neg Hx   ? Rectal cancer Neg Hx   ? Stomach cancer Neg Hx   ?  ?Past Surgical History:  ?Procedure Laterality Date  ? CHOLECYSTECTOMY N/A 05/02/2017  ? Procedure: LAPAROSCOPIC CHOLECYSTECTOMY WITH INTRAOPERATIVE CHOLANGIOGRAM, VENTRAL HERNIA REPAIR;  Surgeon: Jovita Kussmaul, MD;  Location: [facility];  Service: General;  Laterality: N/A;  ? COLON RESECTION N/A 02/03/2014  ? Procedure: LAPAROSCOPIC ASSISTED BOWEL RESECTION;  Surgeon: Jackolyn Confer, MD;  Location: [facility];  Service: General;  Laterality: N/A;  ? FRACTURE SURGERY  2000  ? Ankle  ? TOOTH EXTRACTION    ? wisdom Teeth  ? ?Social History  ? ?Occupational History  ? Not on file  ?Tobacco Use  ? Smoking status: Never  ? Smokeless tobacco: Never  ?Vaping Use  ? Vaping Use: Never used  ?Substance and Sexual Activity  ? Alcohol use: No  ? Drug use: No  ? Sexual activity: Not Currently  ?  Birth control/protection: Post-menopausal  ? ? ? ? ? ?

## 2021-07-14 ENCOUNTER — Telehealth: Payer: Self-pay | Admitting: Hematology

## 2021-07-14 NOTE — Telephone Encounter (Signed)
Rescheduled appointment per providers pal. Patient aware. 

## 2021-07-19 ENCOUNTER — Inpatient Hospital Stay: Payer: Medicare HMO | Attending: Hematology

## 2021-07-19 ENCOUNTER — Other Ambulatory Visit: Payer: Self-pay

## 2021-07-19 DIAGNOSIS — C7B04 Secondary carcinoid tumors of peritoneum: Secondary | ICD-10-CM | POA: Insufficient documentation

## 2021-07-19 DIAGNOSIS — C7B8 Other secondary neuroendocrine tumors: Secondary | ICD-10-CM

## 2021-07-19 DIAGNOSIS — C7A012 Malignant carcinoid tumor of the ileum: Secondary | ICD-10-CM | POA: Diagnosis not present

## 2021-07-19 LAB — CBC WITH DIFFERENTIAL/PLATELET
Abs Immature Granulocytes: 0.02 10*3/uL (ref 0.00–0.07)
Basophils Absolute: 0.1 10*3/uL (ref 0.0–0.1)
Basophils Relative: 1 %
Eosinophils Absolute: 0.2 10*3/uL (ref 0.0–0.5)
Eosinophils Relative: 3 %
HCT: 35 % — ABNORMAL LOW (ref 36.0–46.0)
Hemoglobin: 11.5 g/dL — ABNORMAL LOW (ref 12.0–15.0)
Immature Granulocytes: 0 %
Lymphocytes Relative: 28 %
Lymphs Abs: 2 10*3/uL (ref 0.7–4.0)
MCH: 28.9 pg (ref 26.0–34.0)
MCHC: 32.9 g/dL (ref 30.0–36.0)
MCV: 87.9 fL (ref 80.0–100.0)
Monocytes Absolute: 0.5 10*3/uL (ref 0.1–1.0)
Monocytes Relative: 7 %
Neutro Abs: 4.6 10*3/uL (ref 1.7–7.7)
Neutrophils Relative %: 61 %
Platelets: 214 10*3/uL (ref 150–400)
RBC: 3.98 MIL/uL (ref 3.87–5.11)
RDW: 14 % (ref 11.5–15.5)
WBC: 7.4 10*3/uL (ref 4.0–10.5)
nRBC: 0 % (ref 0.0–0.2)

## 2021-07-19 LAB — COMPREHENSIVE METABOLIC PANEL
ALT: 17 U/L (ref 0–44)
AST: 16 U/L (ref 15–41)
Albumin: 3.6 g/dL (ref 3.5–5.0)
Alkaline Phosphatase: 133 U/L — ABNORMAL HIGH (ref 38–126)
Anion gap: 5 (ref 5–15)
BUN: 17 mg/dL (ref 8–23)
CO2: 27 mmol/L (ref 22–32)
Calcium: 9 mg/dL (ref 8.9–10.3)
Chloride: 107 mmol/L (ref 98–111)
Creatinine, Ser: 0.83 mg/dL (ref 0.44–1.00)
GFR, Estimated: >60 mL/min (ref >60)
Glucose, Bld: 109 mg/dL — ABNORMAL HIGH (ref 70–99)
Potassium: 4.1 mmol/L (ref 3.5–5.1)
Sodium: 139 mmol/L (ref 135–145)
Total Bilirubin: 0.2 mg/dL — ABNORMAL LOW (ref 0.3–1.2)
Total Protein: 6.7 g/dL (ref 6.5–8.1)

## 2021-07-21 ENCOUNTER — Ambulatory Visit: Payer: Medicare HMO | Admitting: Hematology

## 2021-07-22 LAB — CHROMOGRANIN A: Chromogranin A (ng/mL): 145.5 ng/mL — ABNORMAL HIGH (ref 0.0–101.8)

## 2021-07-28 ENCOUNTER — Encounter (HOSPITAL_COMMUNITY): Payer: Self-pay

## 2021-07-28 ENCOUNTER — Ambulatory Visit (HOSPITAL_COMMUNITY)
Admission: RE | Admit: 2021-07-28 | Discharge: 2021-07-28 | Disposition: A | Discharge status: Home / Self Care | Payer: Medicare HMO | Source: Ambulatory Visit | Attending: Hematology | Admitting: Hematology

## 2021-07-28 DIAGNOSIS — K5730 Diverticulosis of large intestine without perforation or abscess without bleeding: Secondary | ICD-10-CM | POA: Diagnosis not present

## 2021-07-28 DIAGNOSIS — I700 Atherosclerosis of aorta: Secondary | ICD-10-CM | POA: Diagnosis not present

## 2021-07-28 DIAGNOSIS — I2510 Atherosclerotic heart disease of native coronary artery without angina pectoris: Secondary | ICD-10-CM | POA: Insufficient documentation

## 2021-07-28 DIAGNOSIS — C7B8 Other secondary neuroendocrine tumors: Secondary | ICD-10-CM | POA: Diagnosis not present

## 2021-07-28 DIAGNOSIS — K6389 Other specified diseases of intestine: Secondary | ICD-10-CM | POA: Diagnosis not present

## 2021-07-28 DIAGNOSIS — R918 Other nonspecific abnormal finding of lung field: Secondary | ICD-10-CM | POA: Insufficient documentation

## 2021-07-28 DIAGNOSIS — C189 Malignant neoplasm of colon, unspecified: Secondary | ICD-10-CM | POA: Diagnosis not present

## 2021-07-28 DIAGNOSIS — K439 Ventral hernia without obstruction or gangrene: Secondary | ICD-10-CM | POA: Diagnosis not present

## 2021-07-28 IMAGING — CT CT CHEST-ABD-PELV W/ CM
3 of 5 series · 13 of 36 positions shown, 14 images · IV contrast (OMNIPAQUE)
Comparison: Multiple priors, most recently CT the chest, abdomen
and pelvis [DATE].

CLINICAL DATA: 76-year-old female with history of colon cancer
diagnosed in [RO]. Follow-up study. * Tracking Code: BO *

EXAM:
CT CHEST, ABDOMEN, AND PELVIS WITH CONTRAST
TECHNIQUE: Multidetector CT imaging of the chest, abdomen and pelvis was
performed following the standard protocol during bolus
administration of intravenous contrast.

[Series 2: cap with · axial · 0.81mm/px · z∈[-572,-82]mm · 8 of 128 slices shown, 9 images]
[im 15/128  mediastinal]
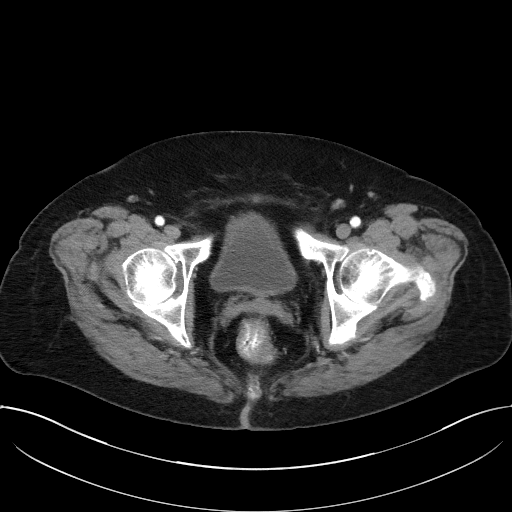
[im 15/128  bone]
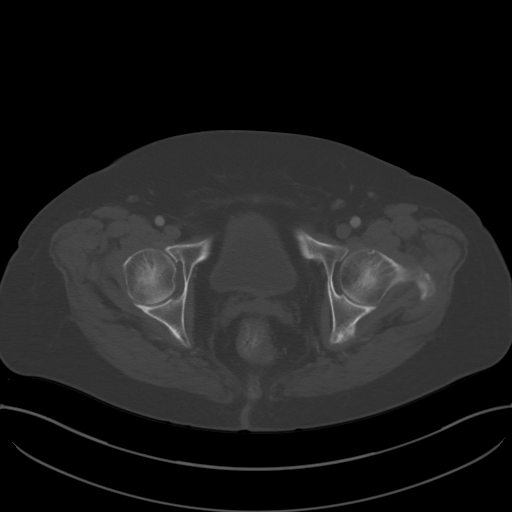
[im 29/128  mediastinal]
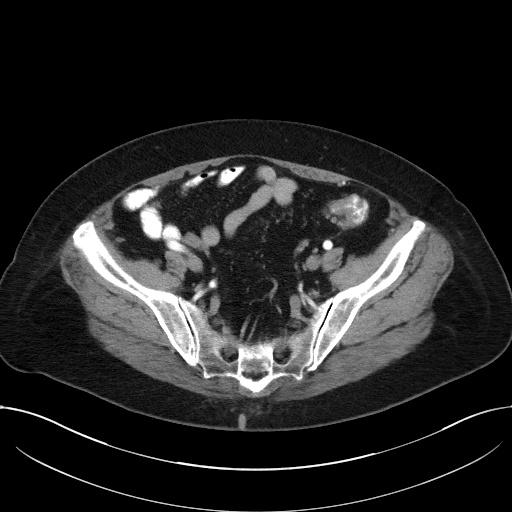
[im 43/128  mediastinal]
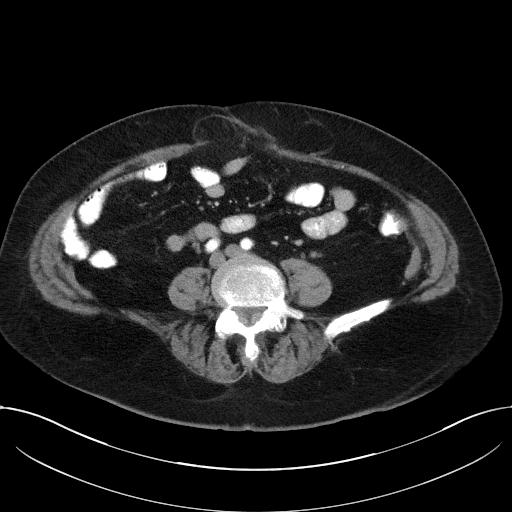
[im 57/128  mediastinal]
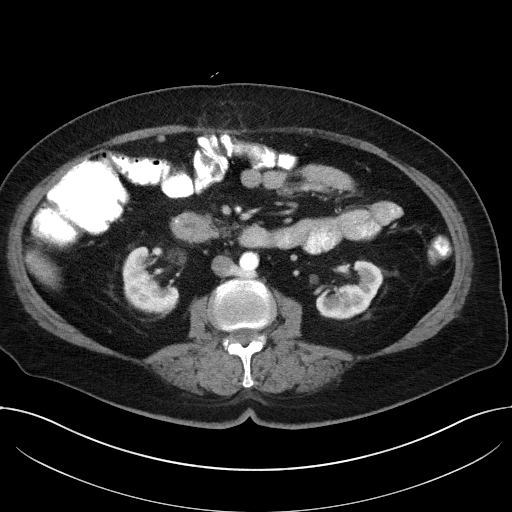
[im 71/128  mediastinal]
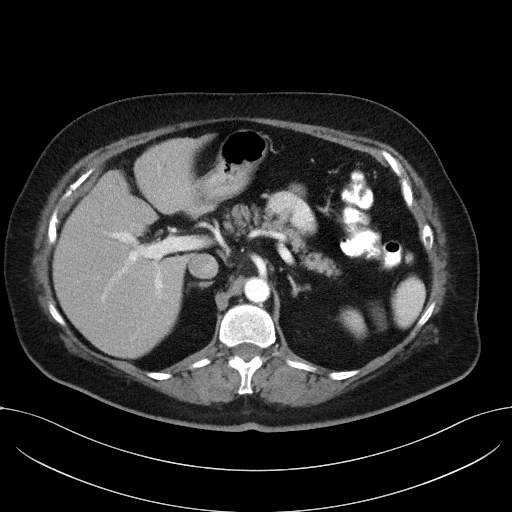
[im 85/128  mediastinal]
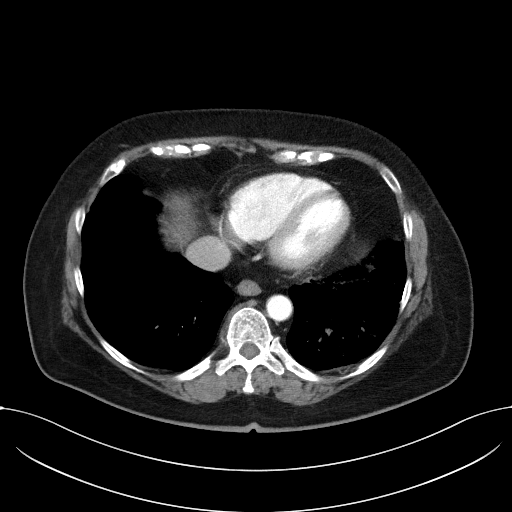
[im 99/128  mediastinal]
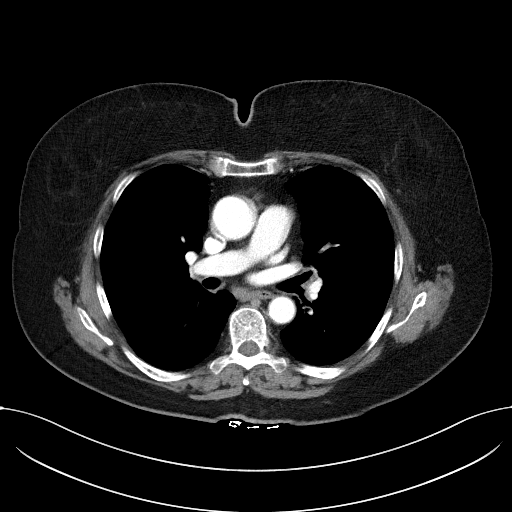
[im 113/128  mediastinal]
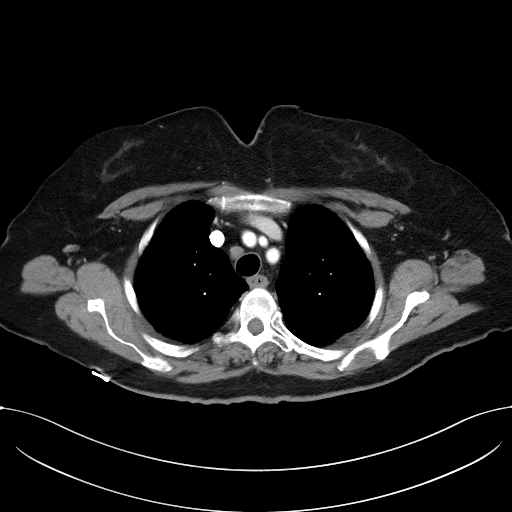

[Series 4: coronals · coronal · 0.77mm/px · 3 of 138 slices shown]
[im 28/138  mediastinal]
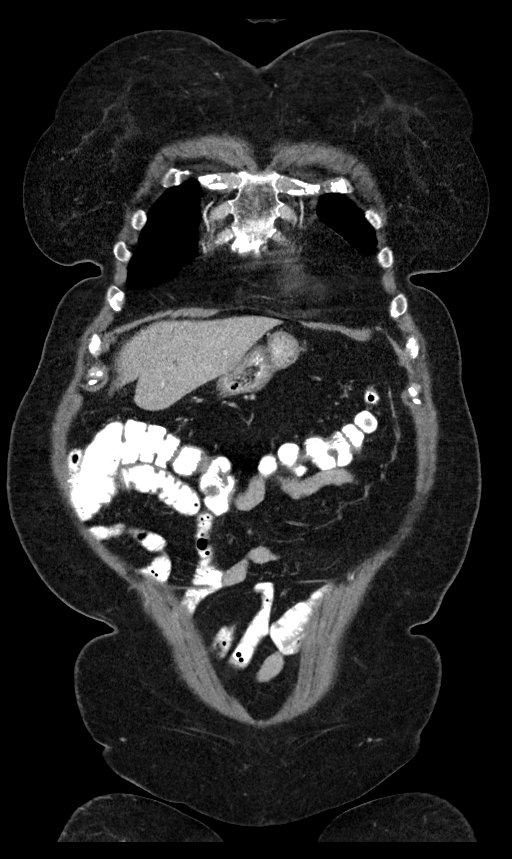
[im 55/138  mediastinal]
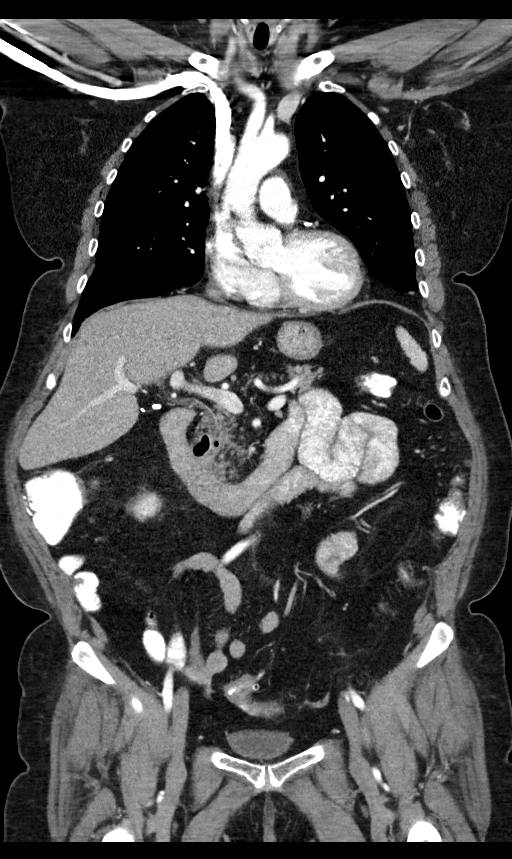
[im 83/138  mediastinal]
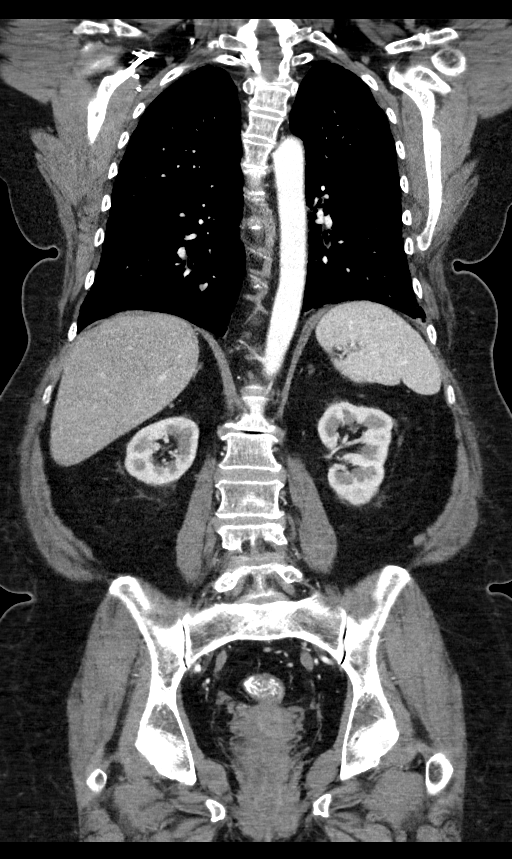

[Series 6: lung · axial · 0.81mm/px · z∈[-304,-256]mm · 2 of 161 slices shown]
[im 13/161  bone]
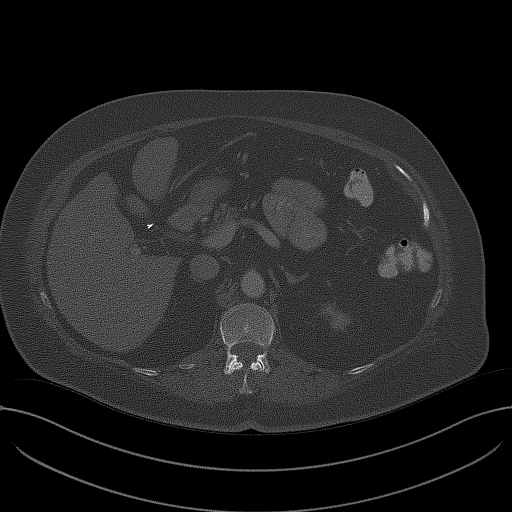
[im 37/161  bone]
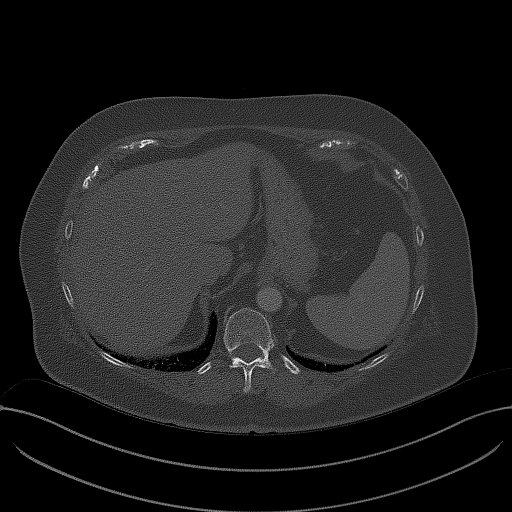

[13 of 36 positions shown; findings below may reference images not displayed]

RADIATION DOSE REDUCTION: This exam was performed according to the
departmental dose-optimization program which includes automated
exposure control, adjustment of the mA and/or kV according to
patient size and/or use of iterative reconstruction technique.

CONTRAST:  100mL OMNIPAQUE IOHEXOL 300 MG/ML  SOLN
FINDINGS: CT CHEST FINDINGS

Cardiovascular: Heart size is normal. There is no significant
pericardial fluid, thickening or pericardial calcification. There is
aortic atherosclerosis, as well as atherosclerosis of the great
vessels of the mediastinum and the coronary arteries, including
calcified atherosclerotic plaque in the left anterior descending
coronary artery.

Mediastinum/Nodes: No pathologically enlarged mediastinal or hilar
lymph nodes. Esophagus is unremarkable in appearance. No axillary
lymphadenopathy.

Lungs/Pleura: Several pulmonary nodules are again noted in the lungs
bilaterally, largest of which is in the base of the left lower lobe
(axial image 116 of series 6), currently measuring 1.3 x 1.2 cm
(previously 1.2 x 0.9 cm). Surrounding this left lower lobe nodule
there are increasing areas of ground-glass attenuation and multiple
ill-defined mixed solid and sub solid nodular areas of architectural
distortion with some associated septal thickening. Right lower lobe
pulmonary nodule (axial image 105 of series 6) currently measures
1.3 x 0.9 cm (previously 1.1 x 0.6 cm). Right middle lobe pulmonary
nodule (axial image 76 of series [DATE] x 0.8 cm (unchanged).
Several other pulmonary nodules also appear similar to the prior
study. No pleural effusions.

Musculoskeletal: There are no aggressive appearing lytic or blastic
lesions noted in the visualized portions of the skeleton.

CT ABDOMEN PELVIS FINDINGS

Hepatobiliary: No suspicious cystic or solid hepatic lesions. No
intra or extrahepatic biliary ductal dilatation. Liver has a
slightly nodular contour, suggestive of underlying cirrhosis. Status
post cholecystectomy.

Pancreas: No pancreatic mass. No pancreatic ductal dilatation. No
pancreatic or peripancreatic fluid collections or inflammatory
changes.

Spleen: Unremarkable.

Adrenals/Urinary Tract: Bilateral kidneys and adrenal glands are
normal in appearance. No hydroureteronephrosis. Urinary bladder is
normal in appearance.

Stomach/Bowel: Normal appearance of the stomach. No pathologic
dilatation of small bowel or colon. Numerous colonic diverticulae
are noted, particularly in the sigmoid colon, without surrounding
inflammatory changes to suggest an acute diverticulitis at this
time. Postoperative changes of distal small bowel and proximal
colonic resection are noted, within ileocolonic anastomosis in the
right-side of the abdomen. No unexpected soft tissue mass in this
region to suggest locally recurrent disease.

Vascular/Lymphatic: Aortic atherosclerosis, without evidence of
aneurysm or dissection in the abdominal or pelvic vasculature.
Retroaortic left renal vein (normal anatomical variant) incidentally
noted. No lymphadenopathy noted in the abdomen or pelvis.

Reproductive: Uterus and ovaries are unremarkable in appearance.

Other: Multiple small ventral hernias containing only omental fat.
Multiple small soft tissue implants are noted in the peritoneal
cavity, predominantly in the omentum, very similar to the prior
study. No significant volume of ascites. No pneumoperitoneum.

Musculoskeletal: There are no aggressive appearing lytic or blastic
lesions noted in the visualized portions of the skeleton.
IMPRESSION: 1. Slight increased size of several pulmonary nodules, as detailed
above, concerning for mild progression of metastatic disease to the
lungs. In addition, there are findings in the left lower lobe which
could be of infectious or inflammatory etiology, although the
possibility of developing lymphangitic spread of tumor in the left
lower lobe is not excluded. Further clinical evaluation and
attention on follow-up studies is recommended.
2. Multiple small soft tissue human plants in the peritoneal cavity
appear very similar to the prior examination. No other new signs of
metastatic disease noted elsewhere in the abdomen or pelvis.
3. Aortic atherosclerosis, in addition to left anterior descending
coronary artery disease. Assessment for potential risk factor
modification, dietary therapy or pharmacologic therapy may be
warranted, if clinically indicated.
4. Colonic diverticulosis without evidence of acute diverticulitis
at this time.
5. Additional incidental findings, similar to prior study, as above.

## 2021-07-28 MED ORDER — SODIUM CHLORIDE (PF) 0.9 % IJ SOLN
INTRAMUSCULAR | Status: AC
  Filled 2021-07-28: qty 50

## 2021-07-28 MED ORDER — IOHEXOL 300 MG/ML  SOLN
100.0000 mL | Freq: Once | INTRAMUSCULAR | Status: AC | PRN
  Administered 2021-07-28: 100 mL via INTRAVENOUS

## 2021-08-01 ENCOUNTER — Ambulatory Visit (INDEPENDENT_AMBULATORY_CARE_PROVIDER_SITE_OTHER): Payer: Medicare HMO

## 2021-08-01 VITALS — BP 118/60 | HR 90 | Temp 97.9°F | Ht 65.0 in | Wt 180.4 lb

## 2021-08-01 DIAGNOSIS — Z0000 Encounter for general adult medical examination without abnormal findings: Secondary | ICD-10-CM

## 2021-08-01 NOTE — Patient Instructions (Signed)
[name] , Thank you for taking time to come for your Medicare Wellness Visit. I appreciate your ongoing commitment to your health goals. Please review the following plan we discussed and let me know if I can assist you in the future.   Screening recommendations/referrals: Colonoscopy: last done 01/28/2014; no repeat due to age 76: last done 03/25/2018; due every 1-2 years Bone Density: last done 03/25/2018; due every 2-3 years Recommended yearly ophthalmology/optometry visit for glaucoma screening and checkup Recommended yearly dental visit for hygiene and checkup  Vaccinations: Influenza vaccine: 10/26/2020 Pneumococcal vaccine: 05/08/2012, 09/15/2015 Tdap vaccine: 02/25/2019; due every 10 years Shingles vaccine: never done   Covid-19: [place] 05/22/2019, 06/16/2019, 02/16/2020  Advanced directives: Advance directive discussed with you today. Even though you declined this today please call our office should you change your mind and we can give you the proper paperwork for you to fill out.  Conditions/risks identified: Yes; Type II Diabetes Mellitus  Next appointment: Please schedule your next Medicare Wellness Visit with your Nurse Health Advisor in 1 year by calling [phone].   Preventive Care 23 Years and Older, Female Preventive care refers to lifestyle choices and visits with your health care provider that can promote health and wellness. What does preventive care include? A yearly physical exam. This is also called an annual well check. Dental exams once or twice a year. Routine eye exams. Ask your health care provider how often you should have your eyes checked. Personal lifestyle choices, including: Daily care of your teeth and gums. Regular physical activity. Eating a healthy diet. Avoiding tobacco and drug use. Limiting alcohol use. Practicing safe sex. Taking low-dose aspirin every day. Taking vitamin and mineral supplements as recommended by your health care  provider. What happens during an annual well check? The services and screenings done by your health care provider during your annual well check will depend on your age, overall health, lifestyle risk factors, and family history of disease. Counseling  Your health care provider may ask you questions about your: Alcohol use. Tobacco use. Drug use. Emotional well-being. Home and relationship well-being. Sexual activity. Eating habits. History of falls. Memory and ability to understand (cognition). Work and work Statistician. Reproductive health. Screening  You may have the following tests or measurements: Height, weight, and BMI. Blood pressure. Lipid and cholesterol levels. These may be checked every 5 years, or more frequently if you are over 39 years old. Skin check. Lung cancer screening. You may have this screening every year starting at age 33 if you have a 30-pack-year history of smoking and currently smoke or have quit within the past 15 years. Fecal occult blood test (FOBT) of the stool. You may have this test every year starting at age 17. Flexible sigmoidoscopy or colonoscopy. You may have a sigmoidoscopy every 5 years or a colonoscopy every 10 years starting at age 48. Hepatitis C blood test. Hepatitis B blood test. Sexually transmitted disease (STD) testing. Diabetes screening. This is done by checking your blood sugar (glucose) after you have not eaten for a while (fasting). You may have this done every 1-3 years. Bone density scan. This is done to screen for osteoporosis. You may have this done starting at age 20. Mammogram. This may be done every 1-2 years. Talk to your health care provider about how often you should have regular mammograms. Talk with your health care provider about your test results, treatment options, and if necessary, the need for more tests. Vaccines  Your health care provider may  recommend certain vaccines, such as: Influenza vaccine. This is  recommended every year. Tetanus, diphtheria, and acellular pertussis (Tdap, Td) vaccine. You may need a Td booster every 10 years. Zoster vaccine. You may need this after age 68. Pneumococcal 13-valent conjugate (PCV13) vaccine. One dose is recommended after age 67. Pneumococcal polysaccharide (PPSV23) vaccine. One dose is recommended after age 5. Talk to your health care provider about which screenings and vaccines you need and how often you need them. This information is not intended to replace advice given to you by your health care provider. Make sure you discuss any questions you have with your health care provider. Document Released: 03/05/2015 Document Revised: 10/27/2015 Document Reviewed: 12/08/2014 Elsevier Interactive Patient Education  2017 [place] Prevention in the Home Falls can cause injuries. They can happen to people of all ages. There are many things you can do to make your home safe and to help prevent falls. What can I do on the outside of my home? Regularly fix the edges of walkways and driveways and fix any cracks. Remove anything that might make you trip as you walk through a door, such as a raised step or threshold. Trim any bushes or trees on the path to your home. Use bright outdoor lighting. Clear any walking paths of anything that might make someone trip, such as rocks or tools. Regularly check to see if handrails are loose or broken. Make sure that both sides of any steps have handrails. Any raised decks and porches should have guardrails on the edges. Have any leaves, snow, or ice cleared regularly. Use sand or salt on walking paths during winter. Clean up any spills in your garage right away. This includes oil or grease spills. What can I do in the bathroom? Use night lights. Install grab bars by the toilet and in the tub and shower. Do not use towel bars as grab bars. Use non-skid mats or decals in the tub or shower. If you need to sit down in  the shower, use a plastic, non-slip stool. Keep the floor dry. Clean up any water that spills on the floor as soon as it happens. Remove soap buildup in the tub or shower regularly. Attach bath mats securely with double-sided non-slip rug tape. Do not have throw rugs and other things on the floor that can make you trip. What can I do in the bedroom? Use night lights. Make sure that you have a light by your bed that is easy to reach. Do not use any sheets or blankets that are too big for your bed. They should not hang down onto the floor. Have a firm chair that has side arms. You can use this for support while you get dressed. Do not have throw rugs and other things on the floor that can make you trip. What can I do in the kitchen? Clean up any spills right away. Avoid walking on wet floors. Keep items that you use a lot in easy-to-reach places. If you need to reach something above you, use a strong step stool that has a grab bar. Keep electrical cords out of the way. Do not use floor polish or wax that makes floors slippery. If you must use wax, use non-skid floor wax. Do not have throw rugs and other things on the floor that can make you trip. What can I do with my stairs? Do not leave any items on the stairs. Make sure that there are handrails on both sides of  the stairs and use them. Fix handrails that are broken or loose. Make sure that handrails are as long as the stairways. Check any carpeting to make sure that it is firmly attached to the stairs. Fix any carpet that is loose or worn. Avoid having throw rugs at the top or bottom of the stairs. If you do have throw rugs, attach them to the floor with carpet tape. Make sure that you have a light switch at the top of the stairs and the bottom of the stairs. If you do not have them, ask someone to add them for you. What else can I do to help prevent falls? Wear shoes that: Do not have high heels. Have rubber bottoms. Are comfortable  and fit you well. Are closed at the toe. Do not wear sandals. If you use a stepladder: Make sure that it is fully opened. Do not climb a closed stepladder. Make sure that both sides of the stepladder are locked into place. Ask someone to hold it for you, if possible. Clearly mark and make sure that you can see: Any grab bars or handrails. First and last steps. Where the edge of each step is. Use tools that help you move around (mobility aids) if they are needed. These include: Canes. Walkers. Scooters. Crutches. Turn on the lights when you go into a dark area. Replace any light bulbs as soon as they burn out. Set up your furniture so you have a clear path. Avoid moving your furniture around. If any of your floors are uneven, fix them. If there are any pets around you, be aware of where they are. Review your medicines with your doctor. Some medicines can make you feel dizzy. This can increase your chance of falling. Ask your doctor what other things that you can do to help prevent falls. This information is not intended to replace advice given to you by your health care provider. Make sure you discuss any questions you have with your health care provider. Document Released: 12/03/2008 Document Revised: 07/15/2015 Document Reviewed: 03/13/2014 Elsevier Interactive Patient Education  2017 [employer].

## 2021-08-01 NOTE — Progress Notes (Signed)
Subjective:   Jessica Farrell is a 76 y.o. female who presents for Medicare Annual (Subsequent) preventive examination.  Review of Systems     Cardiac Risk Factors include: advanced age (>99mn, >>28women);diabetes mellitus;dyslipidemia;family history of premature cardiovascular disease;obesity (BMI >30kg/m2)     Objective:    Today's Vitals   08/01/21 1202  BP: 118/60  Pulse: 90  Temp: 97.9 F (36.6 C)  SpO2: 94%  Weight: 180 lb 6.4 oz (81.8 kg)  Height: '5\' 5"'$  (1.651 m)  PainSc: 0-No pain   Body mass index is 30.02 kg/m.     08/01/2021   12:42 PM 03/18/2021   12:03 AM 01/18/2020    6:12 AM 01/08/2020    9:24 AM 10/28/2019   10:04 AM 09/18/2019   10:51 AM 05/01/2017    9:16 AM  Advanced Directives  Does Patient Have a Medical Advance Directive? No No No No No No No  Would patient like information on creating a medical advance directive? No - Patient declined No - Patient declined  No - Patient declined Yes (MAU/Ambulatory/Procedural Areas - Information given)  Yes (ED - Information included in AVS)    Current Medications (verified) Outpatient Encounter Medications as of 08/01/2021  Medication Sig   albuterol (VENTOLIN HFA) 108 (90 Base) MCG/ACT inhaler INHALE 2 PUFFS INTO THE LUNGS EVERY 6 (SIX) HOURS AS NEEDED FOR WHEEZING OR SHORTNESS OF BREATH.   atorvastatin (LIPITOR) 10 MG tablet TAKE 1 TABLET EVERY DAY   cetirizine (ZYRTEC) 10 MG tablet Take 1 tablet (10 mg total) by mouth daily.   erythromycin ophthalmic ointment 3 (three) times daily.   fluticasone-salmeterol (ADVAIR DISKUS) 250-50 MCG/ACT AEPB Inhale 1 puff into the lungs in the morning and at bedtime.   gabapentin (NEURONTIN) 100 MG capsule Take 1 capsule (100 mg total) by mouth at bedtime.   glimepiride (AMARYL) 2 MG tablet Take 1 tablet (2 mg total) by mouth daily before breakfast.   glucose blood test strip Based on insurance preference. Use as instructed once daily to check sugars. Dx E11.9   Lancets  (ONETOUCH ULTRASOFT) lancets Use as instructed once daily to check sugars. Dx E11.9   levothyroxine (SYNTHROID) 150 MCG tablet Take 1 tablet (150 mcg total) by mouth daily.   loperamide (IMODIUM) 1 MG/5ML solution Take by mouth as needed for diarrhea or loose stools.   metFORMIN (GLUCOPHAGE-XR) 750 MG 24 hr tablet TAKE 2 TABLETS EVERY DAY WITH SUPPER (NEW DOSE)   montelukast (SINGULAIR) 10 MG tablet TAKE 1 TABLET AT BEDTIME   naproxen (NAPROSYN) 375 MG tablet Take 1 tablet (375 mg total) by mouth 2 (two) times daily.   Prenatal Vit-Fe Fumarate-FA (PRENATAL VITAMINS PO) Take by mouth.   No facility-administered encounter medications on file as of 08/01/2021.    Allergies (verified) Penicillins   History: Past Medical History:  Diagnosis Date   Asthma    Cellulitis March  2014   Left foot and ankle   colon ca dx'd 01/2014   Colon polyps    2015    Diabetes mellitus ([place] 05/05/2012   Type II   Hyperlipidemia    Morbid obesity ([place]    Thyroid disease 1990's   HypoThyroidism   Past Surgical History:  Procedure Laterality Date   CHOLECYSTECTOMY N/A 05/02/2017   Procedure: LAPAROSCOPIC CHOLECYSTECTOMY WITH INTRAOPERATIVE CHOLANGIOGRAM, VENTRAL HERNIA REPAIR;  Surgeon: TJovita Kussmaul MD;  Location: [facility];  Service: General;  Laterality: N/A;   COLON RESECTION N/A 02/03/2014   Procedure: LAPAROSCOPIC ASSISTED BOWEL  RESECTION;  Surgeon: Jackolyn Confer, MD;  Location: [facility];  Service: General;  Laterality: N/A;   FRACTURE SURGERY  2000   Ankle   TOOTH EXTRACTION     wisdom Teeth   Family History  Problem Relation Age of Onset   Alzheimer's disease Father    Heart disease Mother    Arthritis Mother    Hypertension Mother    Alzheimer's disease Sister    Cancer Sister 68       breast cancer    Heart disease Brother        Heart Disease before age 41   Breast cancer Sister    Colon cancer Neg Hx    Esophageal cancer Neg Hx    Rectal cancer Neg Hx    Stomach cancer Neg Hx     Social History   Socioeconomic History   Marital status: Divorced    Spouse name: Not on file   Number of children: Not on file   Years of education: Not on file   Highest education level: Not on file  Occupational History   Not on file  Tobacco Use   Smoking status: Never   Smokeless tobacco: Never  Vaping Use   Vaping Use: Never used  Substance and Sexual Activity   Alcohol use: No   Drug use: No   Sexual activity: Not Currently    Birth control/protection: Post-menopausal  Other Topics Concern   Not on file  Social History Narrative   Not on file   Social Determinants of Health   Financial Resource Strain: Low Risk  (08/01/2021)   Overall Financial Resource Strain (CARDIA)    Difficulty of Paying Living Expenses: Not hard at all  Food Insecurity: No Food Insecurity (08/01/2021)   Hunger Vital Sign    Worried About Running Out of Food in the Last Year: Never true    Ran Out of Food in the Last Year: Never true  Transportation Needs: No Transportation Needs (08/01/2021)   PRAPARE - Hydrologist (Medical): No    Lack of Transportation (Non-Medical): No  Physical Activity: Sufficiently Active (08/01/2021)   Exercise Vital Sign    Days of Exercise per Week: 7 days    Minutes of Exercise per Session: 30 min  Stress: No Stress Concern Present (08/01/2021)   [place]    Feeling of Stress : Not at all  Social Connections: Moderately Integrated (08/01/2021)   Social Connection and Isolation Panel [NHANES]    Frequency of Communication with Friends and Family: More than three times a week    Frequency of Social Gatherings with Friends and Family: More than three times a week    Attends Religious Services: More than 4 times per year    Active Member of [employer] or Organizations: Yes    Attends Music therapist: More than 4 times per year    Marital Status: Divorced     Tobacco Counseling Counseling given: Not Answered   Clinical Intake:  Pre-visit preparation completed: Yes  Pain : No/denies pain Pain Score: 0-No pain     BMI - recorded: 30.02 Nutritional Status: BMI > 30  Obese Nutritional Risks: None Diabetes: Yes CBG done?: No (130 per patient (fasting)) Did pt. bring in CBG monitor from home?: No  How often do you need to have someone help you when you read instructions, pamphlets, or other written materials from your doctor or pharmacy?: 1 -  Never What is the last grade level you completed in school?: HSG  Diabetic? yes  Interpreter Needed?: No  Information entered by :: Lisette Abu, LPN.   Activities of Daily Living    08/01/2021   12:45 PM  In your present state of health, do you have any difficulty performing the following activities:  Hearing? 0  Vision? 0  Difficulty concentrating or making decisions? 0  Walking or climbing stairs? 0  Dressing or bathing? 0  Doing errands, shopping? 0  Preparing Food and eating ? N  Using the Toilet? N  In the past six months, have you accidently leaked urine? N  Do you have problems with loss of bowel control? N  Managing your Medications? N  Managing your Finances? N  Housekeeping or managing your Housekeeping? N    Patient Care Team: Binnie Rail, MD as PCP - General (Internal Medicine) Truitt Merle, MD as Consulting Physician (Hematology) Jackolyn Confer, MD as Consulting Physician (General Surgery) Szabat, Darnelle Maffucci, [facility] as Pharmacist (Pharmacist) Danice Goltz, MD as Consulting Physician (Ophthalmology)  Indicate any recent Medical Services you may have received from other than [facility] providers in the past year (date may be approximate).     Assessment:   This is a routine wellness examination for Jessica Farrell.  Hearing/Vision screen Hearing Screening - Comments:: Patient declined any hearing difficulty. No hearing aids. Vision Screening - Comments::  Patient wears eye glasses.  Dietary issues and exercise activities discussed: Current Exercise Habits: Home exercise routine, Type of exercise: walking, Time (Minutes): 30, Frequency (Times/Week): 7, Weekly Exercise (Minutes/Week): 210, Intensity: Moderate, Exercise limited by: orthopedic condition(s)   Goals Addressed             This Visit's Progress    DIET - REDUCE SUGAR INTAKE       Hoping for good news from the cancer doctor this week.      Depression Screen    08/01/2021   12:35 PM 04/26/2021    9:28 AM 04/22/2020    9:36 AM 02/25/2019   10:46 AM 12/29/2016    1:37 PM 09/15/2015   10:05 AM  PHQ 2/9 Scores  PHQ - 2 Score 0 0 0 0 0 0    Fall Risk    08/01/2021   12:44 PM 04/26/2021    9:27 AM 01/23/2020    9:29 AM 12/29/2016    1:37 PM 09/15/2015   10:05 AM  Fall Risk   Falls in the past year? 1 0 1 No No  Number falls in past yr: 0 0 0    Injury with Fall? 1 0 1    Risk for fall due to :  No Fall Risks No Fall Risks    Follow up Falls prevention discussed;Falls evaluation completed Falls evaluation completed Falls evaluation completed      FALL RISK PREVENTION PERTAINING TO THE HOME:  Any stairs in or around the home? No  If so, are there any without handrails? No  Home free of loose throw rugs in walkways, pet beds, electrical cords, etc? Yes  Adequate lighting in your home to reduce risk of falls? Yes   ASSISTIVE DEVICES UTILIZED TO PREVENT FALLS:  Life alert? No  Use of a cane, walker or w/c? No  Grab bars in the bathroom? No  Shower chair or bench in shower? Yes  Elevated toilet seat or a handicapped toilet? No   TIMED UP AND GO:  Was the test performed? Yes .  Length of  time to ambulate 10 feet: 6 sec.   Gait steady and fast without use of assistive device  Cognitive Function:        08/01/2021   12:47 PM  6CIT Screen  What Year? 0 points  What month? 0 points  What time? 0 points  Count back from 20 0 points  Months in reverse 0 points   Repeat phrase 0 points  Total Score 0 points    Immunizations Immunization History  Administered Date(s) Administered   Fluad Quad(high Dose 65+) 02/25/2019, 01/23/2020, 10/26/2020   Influenza Split 05/08/2012   Influenza, High Dose Seasonal PF 12/29/2015, 12/29/2016, 11/21/2017   Influenza,inj,Quad PF,6+ Mos 11/30/2014   Pneumococcal Conjugate-13 09/15/2015   Pneumococcal Polysaccharide-23 05/08/2012   Tdap 02/25/2019    TDAP status: Up to date  Flu Vaccine status: Up to date  Pneumococcal vaccine status: Up to date  Covid-19 vaccine status: Completed vaccines  Qualifies for Shingles Vaccine? Yes   Zostavax completed No   Shingrix Completed?: No.    Education has been provided regarding the importance of this vaccine. Patient has been advised to call insurance company to determine out of pocket expense if they have not yet received this vaccine. Advised may also receive vaccine at local pharmacy or Health Dept. Verbalized acceptance and understanding.  Screening Tests Health Maintenance  Topic Date Due   COVID-19 Vaccine (1) Never done   FOOT EXAM  Never done   OPHTHALMOLOGY EXAM  Never done   Zoster Vaccines- Shingrix (1 of 2) Never done   URINE MICROALBUMIN  04/22/2021   HEMOGLOBIN A1C  04/25/2021   DEXA SCAN  04/27/2022 (Originally 03/25/2020)   INFLUENZA VACCINE  09/20/2021   TETANUS/TDAP  02/24/2029   Pneumonia Vaccine 59+ Years old  Completed   Hepatitis C Screening  Completed   HPV VACCINES  Aged Out   COLONOSCOPY (Pts 45-45yr Insurance coverage will need to be confirmed)  Discontinued    Health Maintenance  Health Maintenance Due  Topic Date Due   COVID-19 Vaccine (1) Never done   FOOT EXAM  Never done   OPHTHALMOLOGY EXAM  Never done   Zoster Vaccines- Shingrix (1 of 2) Never done   URINE MICROALBUMIN  04/22/2021   HEMOGLOBIN A1C  04/25/2021    Colorectal cancer screening: No longer required.   Mammogram status: Completed 03/25/2018. Repeat every  year  Bone Density status: Completed 03/25/2018. Results reflect: Bone density results: OSTEOPENIA. Repeat every 2-3 years.  Lung Cancer Screening: (Low Dose CT Chest recommended if Age 76-80years, 30 pack-year currently smoking OR have quit w/in 15years.) does not qualify.   Lung Cancer Screening Referral: no  Additional Screening:  Hepatitis C Screening: does qualify; Completed 12/29/2015  Vision Screening: Recommended annual ophthalmology exams for early detection of glaucoma and other disorders of the eye. Is the patient up to date with their annual eye exam?  Yes  Who is the provider or what is the name of the office in which the patient attends annual eye exams? [facility] Hunter MD. If pt is not established with a provider, would they like to be referred to a provider to establish care? No .   Dental Screening: Recommended annual dental exams for proper oral hygiene  Community Resource Referral / Chronic Care Management: CRR required this visit?  No   CCM required this visit?  No      Plan:     I have personally reviewed and noted the following in the patient's chart:  Medical and social history Use of alcohol, tobacco or illicit drugs  Current medications and supplements including opioid prescriptions.  Functional ability and status Nutritional status Physical activity Advanced directives List of other physicians Hospitalizations, surgeries, and ER visits in previous 12 months Vitals Screenings to include cognitive, depression, and falls Referrals and appointments  In addition, I have reviewed and discussed with patient certain preventive protocols, quality metrics, and best practice recommendations. A written personalized care plan for preventive services as well as general preventive health recommendations were provided to patient.     Sheral Flow, LPN   [id number]   Nurse Notes:  Hearing Screening - Comments:: Patient declined any hearing  difficulty. No hearing aids. Vision Screening - Comments:: Patient wears eye glasses.

## 2021-08-04 ENCOUNTER — Inpatient Hospital Stay: Payer: Medicare HMO | Attending: Hematology | Admitting: Hematology

## 2021-08-04 ENCOUNTER — Other Ambulatory Visit: Payer: Self-pay

## 2021-08-04 ENCOUNTER — Encounter: Payer: Self-pay | Admitting: Hematology

## 2021-08-04 VITALS — BP 124/72 | HR 66 | Temp 98.5°F | Resp 19 | Ht 65.0 in | Wt 183.4 lb

## 2021-08-04 DIAGNOSIS — C7B04 Secondary carcinoid tumors of peritoneum: Secondary | ICD-10-CM | POA: Diagnosis not present

## 2021-08-04 DIAGNOSIS — C7B01 Secondary carcinoid tumors of distant lymph nodes: Secondary | ICD-10-CM | POA: Insufficient documentation

## 2021-08-04 DIAGNOSIS — C7B8 Other secondary neuroendocrine tumors: Secondary | ICD-10-CM | POA: Diagnosis not present

## 2021-08-04 DIAGNOSIS — Z79899 Other long term (current) drug therapy: Secondary | ICD-10-CM | POA: Insufficient documentation

## 2021-08-04 DIAGNOSIS — H539 Unspecified visual disturbance: Secondary | ICD-10-CM | POA: Insufficient documentation

## 2021-08-04 DIAGNOSIS — E039 Hypothyroidism, unspecified: Secondary | ICD-10-CM | POA: Diagnosis not present

## 2021-08-04 DIAGNOSIS — I10 Essential (primary) hypertension: Secondary | ICD-10-CM | POA: Insufficient documentation

## 2021-08-04 DIAGNOSIS — D649 Anemia, unspecified: Secondary | ICD-10-CM | POA: Insufficient documentation

## 2021-08-04 DIAGNOSIS — Z7984 Long term (current) use of oral hypoglycemic drugs: Secondary | ICD-10-CM | POA: Diagnosis not present

## 2021-08-04 DIAGNOSIS — C7A012 Malignant carcinoid tumor of the ileum: Secondary | ICD-10-CM | POA: Diagnosis not present

## 2021-08-04 DIAGNOSIS — C7B09 Secondary carcinoid tumors of other sites: Secondary | ICD-10-CM | POA: Insufficient documentation

## 2021-08-04 DIAGNOSIS — E119 Type 2 diabetes mellitus without complications: Secondary | ICD-10-CM | POA: Diagnosis not present

## 2021-08-04 NOTE — Progress Notes (Signed)
[place]   [phone] [phone]   Clinic Follow up Note   Patient Care Team: Binnie Rail, MD as PCP - General (Internal Medicine) Truitt Merle, MD as Consulting Physician (Hematology) Jackolyn Confer, MD as Consulting Physician (General Surgery) Tomasa Blase, [place] (Inactive) as Pharmacist (Pharmacist) Danice Goltz, MD as Consulting Physician (Ophthalmology)  Date of Service:  08/04/2021  CHIEF COMPLAINT: f/u of metastatic carcinoid tumor  CURRENT THERAPY:  Surveillance  ASSESSMENT & PLAN:  Jessica Farrell is a 76 y.o. female with   1. Well differentiated low-grade neuroendocrine tumor of terminal ileum (carcinoid tumor), pT3 N1 M0, stage IIIB, peritoneal metastasis 09/2019 -Initially diagnosed 01/2014. Her tumor has been completely resected. Surgical margins were negative. -10/06/19 Dotatate PET showed metastatic disease to peritoneum, abdominal and mediastinal lymph nodes, and the lungs. Indeterminate liver lesions. 10/29/19 peritoneal biopsy confirmed well-differentiated neuroendocrine tumor. -she started First-line Sandostatin injections monthly on 11/06/19. She tolerated poorly and developed hyperglycemia, fatigue, and the episode of syncope and fall right after the injection. This was discontinued after 3 doses. -CT CAP on 02/08/21 showed interval enlargement of three pulmonary nodules, peritoneal and liver mets are stable  -Given her persistent diarrhea and fatigue, she was switched to lanreotide on 02/24/21. Her diarrhea improved, fatigue is stable, but unfortunately she developed visual disturbance that significantly affected her vision. This was discontinued 04/21/21. -restaging CT CAP 07/28/21 showed: slight increase in pulmonary nodules; otherwise no new signs of metastatic disease. I reviewed the results and images with her today.  -I discussed that since her lung nodules remain small and overall stable, we can continue to just  monitor her. Will continue to hold treatment due to her poor tolerance to Sandostatin.  She does have a chronic cough, which I advised her to watch. Labs from 07/19/21 reviewed, overall stable. -f/u in 3 months   2. Visual disturbance -initially noted shortly after starting lanreotide injections on 02/24/21. -describes as "blurry and dim," mainly in her right eye -Seen by Dr. Tama High ophthalmology, concerned for optic nerve abnormality and disc edema, referred to ED -03/17/21 Head CT and brain MRI were normal.  -S/p right ?temporal artery biopsy 03/23/21, path was negative per pt -visual disturbance listed as potential SE for somatostatin analogues. -lanreotide injections discontinued 04/21/21.   3. Mild anemia -New onset mild anemia with Hgb of 10.7 on 08/05/20 -she switched to prenatal vitamins after that visit. She is currently on B12 inj q4 weeks -overall mild and stable, hgb 11.5 on 07/19/21   4. Health maintenance -She has not had colonoscopy since her diagnosis in 01/2014, she is overdue for repeat. Previously referred her back to GI. -last mammogram 03/2018. Order was previously placed for repeat.   5. HTN, DM, hypothyroidism, mild neuropathy, cataracts s/p surgery -She has mild tingling of LE from DM. She rarely uses Gabapentin as needed.  -She is on lipitor, rybelsus, and synthroid. She follows her PCP regularly.     PLAN: -scan reviewed, will continue observation due to the low tumor burden. -lab and f/u in 3 months   No problem-specific Assessment & Plan notes found for this encounter.   SUMMARY OF ONCOLOGIC HISTORY: Oncology History Overview Note  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   Staging form: Colon and Rectum, AJCC 7th Edition     Clinical: T3, N2, M0 - Unsigned     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 ([place])  01/21/2014 Imaging   CT: Distal small bowel obstruction  due to 3.4 cm soft tissue mass near the ileocecal  valve, with adjacent right lower quadrant mesenteric lymphadenopathy   02/03/2014 Pathologic Stage   invasive well diff neuroendocrine tumor (carcinoid), 3cm, pT3pN2 (5/25 nodes positive). negative margines.   02/03/2014 Surgery   Laparoscopic-assisted right colectomy and resection of distal ileum   02/03/2014 Initial Diagnosis   Metastatic malignant neuroendocrine tumor of the ileocecal valve to regional lymph nodes.    05/31/2015 Imaging   CT A/P w contrast  IMPRESSION: 1. No evidence of metastatic disease. 2. Question mild basilar subpleural pulmonary fibrosis.   09/11/2019 Imaging   CT AP W contrast    IMPRESSION: 1. Redemonstrated postoperative findings of ileocolectomy and reanastomosis. 2. There are prominent lymph nodes in the right mesocolon which are slightly increased in size compared to prior examination, the largest node measuring 1.8 x 0.7 cm, previously 1.5 x 0.4 cm. Findings are nonspecific although concerning for nodal metastatic disease. 3. There are additional new small nodules of the transverse mesocolon or omentum measuring up to 9 mm, likewise concerning for nodal or omental metastatic disease. 4. Redemonstrated broad-based midline, fat containing ventral hernia components. 5. Fluid in the endometrial cavity, abnormal in the late postmenopausal setting although unchanged compared to prior examination. 6. Status post interval cholecystectomy. 7. Aortic Atherosclerosis (ICD10-I70.0).   10/06/2019 PET scan   IMPRESSION: 1. Several small nodules in the peritoneal space and retroperitoneal space with intense radiotracer activity consistent with metastatic well differentiated neuroendocrine tumor. 2. Several small liver lesions above background activity are indeterminate. Consider MRI liver with and without contrast. 3. Radiotracer activity within mediastinal lymph nodes and bilateral pulmonary nodules consistent with metastatic  well-differentiated neuroendocrine tumor to the lungs. Activity is very low within these lesions but measurable. 4. No clear evidence skeletal metastasis. Single lesion in the posterior LEFT SI joint warrants attention on follow-up. 5. Although there are multiple sites of metastatic disease, the overall tumor burden of metastatic disease very small.   10/28/2019 Relapse/Recurrence   FINAL MICROSCOPIC DIAGNOSIS:   A. PERITONEAL NODULES, BIOPSY:  -  Well-differentiated neuroendocrine tumor (carcinoid)  -  See comment   COMMENT:   By immunohistochemistry, the neoplastic cells are positive for CD56,  chromogranin and synaptophysin with a low proliferative rate by Ki-67  (less than 1%).  These findings are consistent with metastasis of the  patient's previously diagnosed small bowel carcinoid.  These findings  were discussed with Dr. Morey Hummingbird on October 30, 2019.    11/06/2019 -  Chemotherapy   First-line Monthly Sandostatin injection starting 11/06/19. Held since 01/05/20 due to poor toleration ( hypoglycemia, fatigue, and the episode of syncope and fall right after the injection. The diarrhea and flushing has, but overall symptoms are mild)    05/03/2020 Imaging   CT CAP  IMPRESSION: 1. Stable exam. No new or progressive interval findings. 2. Scattered tiny lung nodules are stable since PET-CT of 10/06/2019. These were hypermetabolic on that exam consistent with metastatic disease. 3. Multiple small omental and mesenteric soft tissue nodules are similar to prior. These were also noted to be hypermetabolic on the PET study. 4. Status post ileocolectomy. 5. Left colonic diverticulosis without diverticulitis. 6. Aortic Atherosclerosis (ICD10-I70.0).     02/08/2021 Imaging   EXAM: CT CHEST, ABDOMEN, AND PELVIS WITH CONTRAST  IMPRESSION: 1. Three of the pulmonary nodules have intervally enlarged, suggesting mild progression of malignancy. The remaining pulmonary nodules in the omental  nodules appear stable, and no definite new nodule is identified. 2. Other imaging  findings of potential clinical significance: Aortic Atherosclerosis (ICD10-I70.0). Coronary atherosclerosis. Systemic atherosclerosis. Complex ventral supraumbilical hernia with multiple defects and lobulations. Lumbar spondylosis and degenerative disc disease   07/28/2021 Imaging   EXAM: CT CHEST, ABDOMEN, AND PELVIS WITH CONTRAST  IMPRESSION: 1. Slight increased size of several pulmonary nodules, as detailed above, concerning for mild progression of metastatic disease to the lungs. In addition, there are findings in the left lower lobe which could be of infectious or inflammatory etiology, although the possibility of developing lymphangitic spread of tumor in the left lower lobe is not excluded. Further clinical evaluation and attention on follow-up studies is recommended. 2. Multiple small soft tissue human plants in the peritoneal cavity appear very similar to the prior examination. No other new signs of metastatic disease noted elsewhere in the abdomen or pelvis. 3. Aortic atherosclerosis, in addition to left anterior descending coronary artery disease. Assessment for potential risk factor modification, dietary therapy or pharmacologic therapy may be warranted, if clinically indicated. 4. Colonic diverticulosis without evidence of acute diverticulitis at this time. 5. Additional incidental findings, similar to prior study, as above.      INTERVAL HISTORY:  Jessica Farrell is here for a follow up of metastatic carcinoid tumor. She was last seen by me on 04/21/21. She presents to the clinic alone. She reports she is doing well overall. I asked about her recent orthopedics visits, and she tells me she tripped over her dog and "cracked her bone" (MRI shows nondisplaced fracture of tibial plateau). She reports chronic productive cough, unsure etiology as she never smoked. She reports clear phlegm.   All  other systems were reviewed with the patient and are negative.  MEDICAL HISTORY:  Past Medical History:  Diagnosis Date   Asthma    Cellulitis March  2014   Left foot and ankle   colon ca dx'd 01/2014   Colon polyps    2015    Diabetes mellitus ([place]) 05/05/2012   Type II   Hyperlipidemia    Morbid obesity ([place])    Thyroid disease 1990's   HypoThyroidism    SURGICAL HISTORY: Past Surgical History:  Procedure Laterality Date   CHOLECYSTECTOMY N/A 05/02/2017   Procedure: LAPAROSCOPIC CHOLECYSTECTOMY WITH INTRAOPERATIVE CHOLANGIOGRAM, VENTRAL HERNIA REPAIR;  Surgeon: Jovita Kussmaul, MD;  Location: [facility];  Service: General;  Laterality: N/A;   COLON RESECTION N/A 02/03/2014   Procedure: LAPAROSCOPIC ASSISTED BOWEL RESECTION;  Surgeon: Jackolyn Confer, MD;  Location: [facility];  Service: General;  Laterality: N/A;   FRACTURE SURGERY  2000   Ankle   TOOTH EXTRACTION     wisdom Teeth    I have reviewed the social history and family history with the patient and they are unchanged from previous note.  ALLERGIES:  is allergic to penicillins.  MEDICATIONS:  Current Outpatient Medications  Medication Sig Dispense Refill   albuterol (VENTOLIN HFA) 108 (90 Base) MCG/ACT inhaler INHALE 2 PUFFS INTO THE LUNGS EVERY 6 (SIX) HOURS AS NEEDED FOR WHEEZING OR SHORTNESS OF BREATH. 54 g 1   atorvastatin (LIPITOR) 10 MG tablet TAKE 1 TABLET EVERY DAY 90 tablet 1   cetirizine (ZYRTEC) 10 MG tablet Take 1 tablet (10 mg total) by mouth daily. 15 tablet 0   erythromycin ophthalmic ointment 3 (three) times daily.     fluticasone-salmeterol (ADVAIR DISKUS) 250-50 MCG/ACT AEPB Inhale 1 puff into the lungs in the morning and at bedtime. 60 each 5   gabapentin (NEURONTIN) 100 MG capsule Take 1 capsule (100 mg total)  by mouth at bedtime. 90 capsule 1   glimepiride (AMARYL) 2 MG tablet Take 1 tablet (2 mg total) by mouth daily before breakfast. 30 tablet 5   glucose blood test strip Based on insurance  preference. Use as instructed once daily to check sugars. Dx E11.9 100 each 12   Lancets (ONETOUCH ULTRASOFT) lancets Use as instructed once daily to check sugars. Dx E11.9 100 each 12   levothyroxine (SYNTHROID) 150 MCG tablet Take 1 tablet (150 mcg total) by mouth daily. 90 tablet 3   loperamide (IMODIUM) 1 MG/5ML solution Take by mouth as needed for diarrhea or loose stools.     metFORMIN (GLUCOPHAGE-XR) 750 MG 24 hr tablet TAKE 2 TABLETS EVERY DAY WITH SUPPER (NEW DOSE) 180 tablet 1   montelukast (SINGULAIR) 10 MG tablet TAKE 1 TABLET AT BEDTIME 90 tablet 1   naproxen (NAPROSYN) 375 MG tablet Take 1 tablet (375 mg total) by mouth 2 (two) times daily. 20 tablet 0   Prenatal Vit-Fe Fumarate-FA (PRENATAL VITAMINS PO) Take by mouth.     No current facility-administered medications for this visit.    PHYSICAL EXAMINATION: ECOG PERFORMANCE STATUS: 1 - Symptomatic but completely ambulatory  Vitals:   08/04/21 0821  BP: 124/72  Pulse: 66  Resp: 19  Temp: 98.5 F (36.9 C)  SpO2: 97%   Wt Readings from Last 3 Encounters:  08/04/21 183 lb 6.4 oz (83.2 kg)  08/01/21 180 lb 6.4 oz (81.8 kg)  05/18/21 175 lb (79.4 kg)     GENERAL:alert, no distress and comfortable SKIN: skin color, texture, turgor are normal, no rashes or significant lesions EYES: normal, Conjunctiva are pink and non-injected, sclera clear  LUNGS: clear to auscultation and percussion with normal breathing effort HEART: regular rate & rhythm and no murmurs and no lower extremity edema Musculoskeletal:no cyanosis of digits and no clubbing  NEURO: alert & oriented x 3 with fluent speech, no focal motor/sensory deficits  LABORATORY DATA:  I have reviewed the data as listed    Latest Ref Rng & Units 07/19/2021    9:41 AM 04/21/2021    9:15 AM 03/17/2021    4:28 PM  CBC  WBC 4.0 - 10.5 K/uL 7.4  8.3  9.2   Hemoglobin 12.0 - 15.0 g/dL 11.5  11.7  13.0   Hematocrit 36.0 - 46.0 % 35.0  36.9  40.5   Platelets 150 - 400 K/uL  214  231  283         Latest Ref Rng & Units 07/19/2021    9:41 AM 04/21/2021    9:15 AM 03/17/2021    6:07 PM  CMP  Glucose 70 - 99 mg/dL 109  179  150   BUN 8 - 23 mg/dL _0 Creatinine 0.44 - 1.00 mg/dL 0.83  0.77  0.80   Sodium 135 - 145 mmol/L 139  138  139   Potassium 3.5 - 5.1 mmol/L 4.1  4.8  4.3   Chloride 98 - 111 mmol/L 107  106  106   CO2 22 - 32 mmol/L _1 Calcium 8.9 - 10.3 mg/dL 9.0  9.2  8.9   Total Protein 6.5 - 8.1 g/dL 6.7  7.1  7.3   Total Bilirubin 0.3 - 1.2 mg/dL 0.2  0.4  0.5   Alkaline Phos 38 - 126 U/L 133  142  140   AST 15 - 41 U/L 16  21  15  ALT 0 - 44 U/L _0 RADIOGRAPHIC STUDIES: I have personally reviewed the radiological images as listed and agreed with the findings in the report. No results found.    No orders of the defined types were placed in this encounter.  All questions were answered. The patient knows to call the clinic with any problems, questions or concerns. No barriers to learning was detected. The total time spent in the appointment was 30 minutes.     Truitt Merle, MD 08/04/2021   I, Wilburn Mylar, am acting as scribe for Truitt Merle, MD.   I have reviewed the above documentation for accuracy and completeness, and I agree with the above.

## 2021-08-26 ENCOUNTER — Other Ambulatory Visit: Payer: Self-pay | Admitting: Internal Medicine

## 2021-09-25 ENCOUNTER — Other Ambulatory Visit: Payer: Self-pay | Admitting: Internal Medicine

## 2021-09-25 DIAGNOSIS — E1165 Type 2 diabetes mellitus with hyperglycemia: Secondary | ICD-10-CM

## 2021-10-11 ENCOUNTER — Other Ambulatory Visit: Payer: Self-pay | Admitting: Internal Medicine

## 2021-11-03 ENCOUNTER — Other Ambulatory Visit: Payer: Self-pay

## 2021-11-03 DIAGNOSIS — C7B8 Other secondary neuroendocrine tumors: Secondary | ICD-10-CM

## 2021-11-04 ENCOUNTER — Other Ambulatory Visit: Payer: Self-pay

## 2021-11-04 ENCOUNTER — Inpatient Hospital Stay: Payer: Medicare HMO | Attending: Hematology

## 2021-11-04 ENCOUNTER — Other Ambulatory Visit: Payer: Self-pay | Admitting: Lab

## 2021-11-04 ENCOUNTER — Encounter: Payer: Self-pay | Admitting: Hematology

## 2021-11-04 ENCOUNTER — Inpatient Hospital Stay (HOSPITAL_BASED_OUTPATIENT_CLINIC_OR_DEPARTMENT_OTHER): Payer: Medicare HMO | Admitting: Hematology

## 2021-11-04 VITALS — BP 139/86 | HR 69 | Temp 98.0°F | Resp 18 | Ht 65.0 in | Wt 190.6 lb

## 2021-11-04 DIAGNOSIS — C7A012 Malignant carcinoid tumor of the ileum: Secondary | ICD-10-CM | POA: Diagnosis not present

## 2021-11-04 DIAGNOSIS — C7B8 Other secondary neuroendocrine tumors: Secondary | ICD-10-CM

## 2021-11-04 DIAGNOSIS — C7B04 Secondary carcinoid tumors of peritoneum: Secondary | ICD-10-CM | POA: Diagnosis not present

## 2021-11-04 DIAGNOSIS — E538 Deficiency of other specified B group vitamins: Secondary | ICD-10-CM

## 2021-11-04 DIAGNOSIS — C7B02 Secondary carcinoid tumors of liver: Secondary | ICD-10-CM | POA: Diagnosis not present

## 2021-11-04 LAB — CMP (CANCER CENTER ONLY)
ALT: 19 U/L (ref 0–44)
AST: 18 U/L (ref 15–41)
Albumin: 3.3 g/dL — ABNORMAL LOW (ref 3.5–5.0)
Alkaline Phosphatase: 120 U/L (ref 38–126)
Anion gap: 4 — ABNORMAL LOW (ref 5–15)
BUN: 19 mg/dL (ref 8–23)
CO2: 27 mmol/L (ref 22–32)
Calcium: 9.1 mg/dL (ref 8.9–10.3)
Chloride: 109 mmol/L (ref 98–111)
Creatinine: 0.79 mg/dL (ref 0.44–1.00)
GFR, Estimated: >60 mL/min (ref >60)
Glucose, Bld: 167 mg/dL — ABNORMAL HIGH (ref 70–99)
Potassium: 4.5 mmol/L (ref 3.5–5.1)
Sodium: 140 mmol/L (ref 135–145)
Total Bilirubin: 0.4 mg/dL (ref 0.3–1.2)
Total Protein: 6.8 g/dL (ref 6.5–8.1)

## 2021-11-04 LAB — CBC WITH DIFFERENTIAL (CANCER CENTER ONLY)
Abs Immature Granulocytes: 0.01 10*3/uL (ref 0.00–0.07)
Basophils Absolute: 0.1 10*3/uL (ref 0.0–0.1)
Basophils Relative: 1 %
Eosinophils Absolute: 0.3 10*3/uL (ref 0.0–0.5)
Eosinophils Relative: 4 %
HCT: 35.2 % — ABNORMAL LOW (ref 36.0–46.0)
Hemoglobin: 11.5 g/dL — ABNORMAL LOW (ref 12.0–15.0)
Immature Granulocytes: 0 %
Lymphocytes Relative: 30 %
Lymphs Abs: 1.9 10*3/uL (ref 0.7–4.0)
MCH: 29 pg (ref 26.0–34.0)
MCHC: 32.7 g/dL (ref 30.0–36.0)
MCV: 88.7 fL (ref 80.0–100.0)
Monocytes Absolute: 0.5 10*3/uL (ref 0.1–1.0)
Monocytes Relative: 7 %
Neutro Abs: 3.6 10*3/uL (ref 1.7–7.7)
Neutrophils Relative %: 58 %
Platelet Count: 193 10*3/uL (ref 150–400)
RBC: 3.97 MIL/uL (ref 3.87–5.11)
RDW: 12.5 % (ref 11.5–15.5)
WBC Count: 6.3 10*3/uL (ref 4.0–10.5)
nRBC: 0 % (ref 0.0–0.2)

## 2021-11-04 NOTE — Progress Notes (Signed)
Jessica Farrell   [phone] [phone]   Clinic Follow up Note   Patient Care Team: Binnie Rail, MD as PCP - General (Internal Medicine) Truitt Merle, MD as Consulting Physician (Hematology) Jackolyn Confer, MD as Consulting Physician (General Surgery) Tomasa Blase, [facility] (Inactive) as Pharmacist (Pharmacist) Danice Goltz, MD as Consulting Physician (Ophthalmology)  Date of Service:  11/04/2021  CHIEF COMPLAINT: f/u of metastatic carcinoid tumor  CURRENT THERAPY:  Surveillance  ASSESSMENT & PLAN:  Jessica Farrell is a 76 y.o. female with   1. Well differentiated low-grade neuroendocrine tumor of terminal ileum (carcinoid tumor), pT3 N1 M0, stage IIIB, peritoneal metastasis 09/2019 -diagnosed 01/2014, s/p resection 02/03/14 showing 3 cm NET involving serosal tissue. Margins negative, but 5/25 positive nodes with extracapsular extension. -10/06/19 Dotatate PET showed metastatic disease to peritoneum, abdominal and mediastinal lymph nodes, and lungs. Indeterminate liver lesions. 10/29/19 peritoneal biopsy confirmed well-differentiated neuroendocrine tumor. -she took Sandostatin injections monthly 11/06/19 - 01/05/20, discontinued due to hyperglycemia, fatigue, and an episode of syncope and fall right after the injection. -CT CAP on 02/08/21 showed: enlargement of three pulmonary nodules; stable peritoneal and liver mets. -for symptom management, she took one dose lanreotide on 02/24/21. This improved her diarrhea, but she developed vision issues in her right eye. -restaging CT CAP 07/28/21 showed: slight increase in pulmonary nodules; otherwise no new signs of metastatic disease. Will continue to monitor given poor tolerance of prior treatment. -she is doing well overall with stable diarrhea, managed with imodium. She does have a cough. Will see what next scan shows. Lab reviewed, overall stable. -f/u in 3 months after CT scan.  2. Cough -she has  never smoked -she has rhonchi to b/l lung bases on exam today (11/04/21). If her symptoms persist or worsen, I may recommend restarting cancer treatment with oral Afinitor. Will monitor for now with repeat CT in 3 months.   2. Visual disturbance -initially noted shortly after lanreotide injection on 02/24/21. -describes as "blurry and dim," mainly in her right eye -03/17/21 Head CT and brain MRI were normal.  -S/p right ?temporal artery biopsy 03/23/21, path was negative per pt -followed by Dr. Tama High ophthalmology -she reports today (11/04/21) that this still persists in her right eye.   3. Mild anemia -New onset mild anemia with Hgb of 10.7 on 08/05/20 -she switched to prenatal vitamins after that visit. She was previously on B12 inj q4 weeks -overall mild and stable, hgb 11.5 today (11/04/21). Will check her B12 level at next visit.     PLAN: -f/u in 3 months with lab and CT several days before   No problem-specific Assessment & Plan notes found for this encounter.   SUMMARY OF ONCOLOGIC HISTORY: Oncology History Overview Note  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   Staging form: Colon and Rectum, AJCC 7th Edition     Clinical: T3, N2, M0 - Unsigned     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 (Jessica Farrell)  01/21/2014 Imaging   CT: Distal small bowel obstruction due to 3.4 cm soft tissue mass near the ileocecal valve, with adjacent right lower quadrant mesenteric lymphadenopathy   02/03/2014 Pathologic Stage   invasive well diff neuroendocrine tumor (carcinoid), 3cm, pT3pN2 (5/25 nodes positive). negative margines.   02/03/2014 Surgery   Laparoscopic-assisted right colectomy and resection of distal ileum   02/03/2014 Initial Diagnosis   Metastatic malignant neuroendocrine tumor of the ileocecal valve to regional lymph nodes.    05/31/2015 Imaging  CT A/P w contrast  IMPRESSION: 1. No evidence of metastatic disease. 2. Question mild  basilar subpleural pulmonary fibrosis.   09/11/2019 Imaging   CT AP W contrast    IMPRESSION: 1. Redemonstrated postoperative findings of ileocolectomy and reanastomosis. 2. There are prominent lymph nodes in the right mesocolon which are slightly increased in size compared to prior examination, the largest node measuring 1.8 x 0.7 cm, previously 1.5 x 0.4 cm. Findings are nonspecific although concerning for nodal metastatic disease. 3. There are additional new small nodules of the transverse mesocolon or omentum measuring up to 9 mm, likewise concerning for nodal or omental metastatic disease. 4. Redemonstrated broad-based midline, fat containing ventral hernia components. 5. Fluid in the endometrial cavity, abnormal in the late postmenopausal setting although unchanged compared to prior examination. 6. Status post interval cholecystectomy. 7. Aortic Atherosclerosis (ICD10-I70.0).   10/06/2019 PET scan   IMPRESSION: 1. Several small nodules in the peritoneal space and retroperitoneal space with intense radiotracer activity consistent with metastatic well differentiated neuroendocrine tumor. 2. Several small liver lesions above background activity are indeterminate. Consider MRI liver with and without contrast. 3. Radiotracer activity within mediastinal lymph nodes and bilateral pulmonary nodules consistent with metastatic well-differentiated neuroendocrine tumor to the lungs. Activity is very low within these lesions but measurable. 4. No clear evidence skeletal metastasis. Single lesion in the posterior LEFT SI joint warrants attention on follow-up. 5. Although there are multiple sites of metastatic disease, the overall tumor burden of metastatic disease very small.   10/28/2019 Relapse/Recurrence   FINAL MICROSCOPIC DIAGNOSIS:   A. PERITONEAL NODULES, BIOPSY:  -  Well-differentiated neuroendocrine tumor (carcinoid)  -  See comment   COMMENT:   By immunohistochemistry,  the neoplastic cells are positive for CD56,  chromogranin and synaptophysin with a low proliferative rate by Ki-67  (less than 1%).  These findings are consistent with metastasis of the  patient's previously diagnosed small bowel carcinoid.  These findings  were discussed with Dr. Morey Hummingbird on October 30, 2019.    11/06/2019 -  Chemotherapy   First-line Monthly Sandostatin injection starting 11/06/19. Held since 01/05/20 due to poor toleration ( hypoglycemia, fatigue, and the episode of syncope and fall right after the injection. The diarrhea and flushing has, but overall symptoms are mild)    05/03/2020 Imaging   CT CAP  IMPRESSION: 1. Stable exam. No new or progressive interval findings. 2. Scattered tiny lung nodules are stable since PET-CT of 10/06/2019. These were hypermetabolic on that exam consistent with metastatic disease. 3. Multiple small omental and mesenteric soft tissue nodules are similar to prior. These were also noted to be hypermetabolic on the PET study. 4. Status post ileocolectomy. 5. Left colonic diverticulosis without diverticulitis. 6. Aortic Atherosclerosis (ICD10-I70.0).     02/08/2021 Imaging   EXAM: CT CHEST, ABDOMEN, AND PELVIS WITH CONTRAST  IMPRESSION: 1. Three of the pulmonary nodules have intervally enlarged, suggesting mild progression of malignancy. The remaining pulmonary nodules in the omental nodules appear stable, and no definite new nodule is identified. 2. Other imaging findings of potential clinical significance: Aortic Atherosclerosis (ICD10-I70.0). Coronary atherosclerosis. Systemic atherosclerosis. Complex ventral supraumbilical hernia with multiple defects and lobulations. Lumbar spondylosis and degenerative disc disease   07/28/2021 Imaging   EXAM: CT CHEST, ABDOMEN, AND PELVIS WITH CONTRAST  IMPRESSION: 1. Slight increased size of several pulmonary nodules, as detailed above, concerning for mild progression of metastatic disease to  the lungs. In addition, there are findings in the left lower lobe which could be of  infectious or inflammatory etiology, although the possibility of developing lymphangitic spread of tumor in the left lower lobe is not excluded. Further clinical evaluation and attention on follow-up studies is recommended. 2. Multiple small soft tissue human plants in the peritoneal cavity appear very similar to the prior examination. No other new signs of metastatic disease noted elsewhere in the abdomen or pelvis. 3. Aortic atherosclerosis, in addition to left anterior descending coronary artery disease. Assessment for potential risk factor modification, dietary therapy or pharmacologic therapy may be warranted, if clinically indicated. 4. Colonic diverticulosis without evidence of acute diverticulitis at this time. 5. Additional incidental findings, similar to prior study, as above.      INTERVAL HISTORY:  Sonora Catlin is here for a follow up of metastatic NET. She was last seen by me on 08/04/21. She presents to the clinic alone. She reports she is doing well overall. She notes continued diarrhea,stable and managed with imodium.   All other systems were reviewed with the patient and are negative.  MEDICAL HISTORY:  Past Medical History:  Diagnosis Date   Asthma    Cellulitis March  2014   Left foot and ankle   colon ca dx'd 01/2014   Colon polyps    2015    Diabetes mellitus ([place]) 05/05/2012   Type II   Hyperlipidemia    Morbid obesity ([place])    Thyroid disease 1990's   HypoThyroidism    SURGICAL HISTORY: Past Surgical History:  Procedure Laterality Date   CHOLECYSTECTOMY N/A 05/02/2017   Procedure: LAPAROSCOPIC CHOLECYSTECTOMY WITH INTRAOPERATIVE CHOLANGIOGRAM, VENTRAL HERNIA REPAIR;  Surgeon: Jovita Kussmaul, MD;  Location: [facility];  Service: General;  Laterality: N/A;   COLON RESECTION N/A 02/03/2014   Procedure: LAPAROSCOPIC ASSISTED BOWEL RESECTION;  Surgeon: Jackolyn Confer,  MD;  Location: [facility];  Service: General;  Laterality: N/A;   FRACTURE SURGERY  2000   Ankle   TOOTH EXTRACTION     wisdom Teeth    I have reviewed the social history and family history with the patient and they are unchanged from previous note.  ALLERGIES:  is allergic to penicillins.  MEDICATIONS:  Current Outpatient Medications  Medication Sig Dispense Refill   albuterol (VENTOLIN HFA) 108 (90 Base) MCG/ACT inhaler INHALE 2 PUFFS EVERY 6 HOURS AS NEEDED FOR WHEEZING OR SHORTNESS OF BREATH 54 g 1   atorvastatin (LIPITOR) 10 MG tablet TAKE 1 TABLET EVERY DAY 90 tablet 1   cetirizine (ZYRTEC) 10 MG tablet Take 1 tablet (10 mg total) by mouth daily. 15 tablet 0   erythromycin ophthalmic ointment 3 (three) times daily.     fluticasone-salmeterol (ADVAIR DISKUS) 250-50 MCG/ACT AEPB Inhale 1 puff into the lungs in the morning and at bedtime. 60 each 5   gabapentin (NEURONTIN) 100 MG capsule Take 1 capsule (100 mg total) by mouth at bedtime. 90 capsule 1   glimepiride (AMARYL) 2 MG tablet TAKE 1 TABLET BY MOUTH ONCE DAILY BEFORE BREAKFAST 30 tablet 0   glucose blood test strip Based on insurance preference. Use as instructed once daily to check sugars. Dx E11.9 100 each 12   Lancets (ONETOUCH ULTRASOFT) lancets Use as instructed once daily to check sugars. Dx E11.9 100 each 12   levothyroxine (SYNTHROID) 150 MCG tablet Take 1 tablet (150 mcg total) by mouth daily. 90 tablet 3   loperamide (IMODIUM) 1 MG/5ML solution Take by mouth as needed for diarrhea or loose stools.     metFORMIN (GLUCOPHAGE-XR) 750 MG 24 hr tablet TAKE 2  TABLETS EVERY DAY WITH SUPPER (NEW DOSE) 180 tablet 1   montelukast (SINGULAIR) 10 MG tablet TAKE 1 TABLET AT BEDTIME 90 tablet 1   naproxen (NAPROSYN) 375 MG tablet Take 1 tablet (375 mg total) by mouth 2 (two) times daily. 20 tablet 0   Prenatal Vit-Fe Fumarate-FA (PRENATAL VITAMINS PO) Take by mouth.     No current facility-administered medications for this visit.     PHYSICAL EXAMINATION: ECOG PERFORMANCE STATUS: 1 - Symptomatic but completely ambulatory  Vitals:   11/04/21 0843  BP: 139/86  Pulse: 69  Resp: 18  Temp: 98 F (36.7 C)  SpO2: 100%   Wt Readings from Last 3 Encounters:  11/04/21 190 lb 9.6 oz (86.5 kg)  08/04/21 183 lb 6.4 oz (83.2 kg)  08/01/21 180 lb 6.4 oz (81.8 kg)     GENERAL:alert, no distress and comfortable SKIN: skin color, texture, turgor are normal, no rashes or significant lesions EYES: normal, Conjunctiva are pink and non-injected, sclera clear  NECK: supple, thyroid normal size, non-tender, without nodularity LYMPH:  no palpable lymphadenopathy in the cervical, axillary  LUNGS: normal breathing effort, (+) scatter bilateral rhonchi  HEART: regular rate & rhythm and no murmurs and no lower extremity edema ABDOMEN: soft, non-tender and normal bowel sounds Musculoskeletal:no cyanosis of digits and no clubbing  NEURO: alert & oriented x 3 with fluent speech, no focal motor/sensory deficits  LABORATORY DATA:  I have reviewed the data as listed    Latest Ref Rng & Units 11/04/2021    8:19 AM 07/19/2021    9:41 AM 04/21/2021    9:15 AM  CBC  WBC 4.0 - 10.5 K/uL 6.3  7.4  8.3   Hemoglobin 12.0 - 15.0 g/dL 11.5  11.5  11.7   Hematocrit 36.0 - 46.0 % 35.2  35.0  36.9   Platelets 150 - 400 K/uL 193  214  231         Latest Ref Rng & Units 11/04/2021    8:19 AM 07/19/2021    9:41 AM 04/21/2021    9:15 AM  CMP  Glucose 70 - 99 mg/dL 167  109  179   BUN 8 - 23 mg/dL $Remove'19  17  14   'qNDYjgx$ Creatinine 0.44 - 1.00 mg/dL 0.79  0.83  0.77   Sodium 135 - 145 mmol/L 140  139  138   Potassium 3.5 - 5.1 mmol/L 4.5  4.1  4.8   Chloride 98 - 111 mmol/L 109  107  106   CO2 22 - 32 mmol/L $RemoveB'27  27  28   'eVRyjKPF$ Calcium 8.9 - 10.3 mg/dL 9.1  9.0  9.2   Total Protein 6.5 - 8.1 g/dL 6.8  6.7  7.1   Total Bilirubin 0.3 - 1.2 mg/dL 0.4  0.2  0.4   Alkaline Phos 38 - 126 U/L 120  133  142   AST 15 - 41 U/L $Remo'18  16  21   'yOcbz$ ALT 0 - 44 U/L $Remo'19  17  28        'UxLrg$ RADIOGRAPHIC STUDIES: I have personally reviewed the radiological images as listed and agreed with the findings in the report. No results found.    Orders Placed This Encounter  Procedures   CT CHEST ABDOMEN PELVIS W CONTRAST    Standing Status:   Future    Standing Expiration Date:   11/05/2022    Order Specific Question:   Preferred imaging location?    Answer:   [facility]  Order Specific Question:   Is Oral Contrast requested for this exam?    Answer:   Yes, Per Radiology protocol   Vitamin B12    Standing Status:   Standing    Number of Occurrences:   5    Standing Expiration Date:   11/05/2022   All questions were answered. The patient knows to call the clinic with any problems, questions or concerns. No barriers to learning was detected. The total time spent in the appointment was 30 minutes.     Truitt Merle, MD 11/04/2021   I, Wilburn Mylar, am acting as scribe for Truitt Merle, MD.   I have reviewed the above documentation for accuracy and completeness, and I agree with the above.

## 2021-11-07 LAB — CHROMOGRANIN A: Chromogranin A (ng/mL): 153.8 ng/mL — ABNORMAL HIGH (ref 0.0–101.8)

## 2021-11-08 ENCOUNTER — Other Ambulatory Visit: Payer: Self-pay | Admitting: Internal Medicine

## 2021-11-17 ENCOUNTER — Encounter: Payer: Self-pay | Admitting: Internal Medicine

## 2021-11-17 NOTE — Patient Instructions (Addendum)
    Flu immunization administered today.     Consider getting the COVID booster and shingles vaccine at your pharmacy.  You should also consider the new RSV vaccine.   Blood work was ordered.     Medications changes include :   increase Advair dose to 500-50.  Rybelsus 3 mg -- if this is covered stop the glimepiride   Your prescription(s) have been sent to your pharmacy.      Return in about 6 months (around 05/19/2022) for Physical Exam.

## 2021-11-17 NOTE — Progress Notes (Signed)
Subjective:    Patient ID: Jessica Farrell, female    DOB: [birth year], 76 y.o.   MRN: [id number]     HPI [name] is here for follow up of her chronic medical problems, including DM, hld, hypothyroid, asthma  Stopped metformin  - sugars controlled.  Occ low sugar with glimepiride.   Had cough last week - felt it was a cold - taking mucinex.  Feeling better.  Having a lot of diarrhea.  At one point she had an episode of vertigo - thinks it was dehydration.     Medications and allergies reviewed with patient and updated if appropriate.  Current Outpatient Medications on File Prior to Visit  Medication Sig Dispense Refill   albuterol (VENTOLIN HFA) 108 (90 Base) MCG/ACT inhaler INHALE 2 PUFFS EVERY 6 HOURS AS NEEDED FOR WHEEZING OR SHORTNESS OF BREATH 54 g 1   atorvastatin (LIPITOR) 10 MG tablet TAKE 1 TABLET EVERY DAY 90 tablet 1   cetirizine (ZYRTEC) 10 MG tablet Take 1 tablet (10 mg total) by mouth daily. 15 tablet 0   erythromycin ophthalmic ointment 3 (three) times daily.     gabapentin (NEURONTIN) 100 MG capsule Take 1 capsule (100 mg total) by mouth at bedtime. 90 capsule 1   glimepiride (AMARYL) 2 MG tablet TAKE 1 TABLET BY MOUTH ONCE DAILY BEFORE BREAKFAST 30 tablet 0   glucose blood test strip Based on insurance preference. Use as instructed once daily to check sugars. Dx E11.9 100 each 12   Lancets (ONETOUCH ULTRASOFT) lancets Use as instructed once daily to check sugars. Dx E11.9 100 each 12   levothyroxine (SYNTHROID) 150 MCG tablet Take 1 tablet (150 mcg total) by mouth daily. 90 tablet 3   loperamide (IMODIUM) 1 MG/5ML solution Take by mouth as needed for diarrhea or loose stools.     montelukast (SINGULAIR) 10 MG tablet TAKE 1 TABLET AT BEDTIME 90 tablet 1   naproxen (NAPROSYN) 375 MG tablet Take 1 tablet (375 mg total) by mouth 2 (two) times daily. 20 tablet 0   Prenatal Vit-Fe Fumarate-FA (PRENATAL VITAMINS PO) Take by mouth.     [DISCONTINUED]  fluticasone-salmeterol (ADVAIR DISKUS) 250-50 MCG/ACT AEPB Inhale 1 puff into the lungs in the morning and at bedtime. 60 each 5   No current facility-administered medications on file prior to visit.     Review of Systems  Constitutional:  Negative for fever.  HENT:  Negative for congestion, sinus pain and sore throat.   Respiratory:  Positive for cough (from cold - better) and wheezing. Negative for shortness of breath.   Cardiovascular:  Positive for leg swelling. Negative for chest pain and palpitations.  Gastrointestinal:  Positive for diarrhea.       No gerd  Neurological:  Positive for dizziness (one episode). Negative for light-headedness and headaches.       Objective:   Vitals:   11/18/21 0806  BP: 118/70  Pulse: 95  Temp: 98.1 F (36.7 C)  SpO2: 96%   BP Readings from Last 3 Encounters:  11/18/21 118/70  11/04/21 139/86  08/04/21 124/72   Wt Readings from Last 3 Encounters:  11/18/21 188 lb (85.3 kg)  11/04/21 190 lb 9.6 oz (86.5 kg)  08/04/21 183 lb 6.4 oz (83.2 kg)   Body mass index is 31.28 kg/m.    Physical Exam Constitutional:      General: She is not in acute distress.    Appearance: Normal appearance.  HENT:     Head:  Normocephalic and atraumatic.  Eyes:     Conjunctiva/sclera: Conjunctivae normal.  Cardiovascular:     Rate and Rhythm: Normal rate and regular rhythm.     Heart sounds: Normal heart sounds. No murmur heard. Pulmonary:     Effort: Pulmonary effort is normal. No respiratory distress.     Breath sounds: Wheezing (very minimal) present.  Musculoskeletal:     Cervical back: Neck supple.     Right lower leg: No edema.     Left lower leg: No edema.  Lymphadenopathy:     Cervical: No cervical adenopathy.  Skin:    General: Skin is warm and dry.     Findings: No rash.  Neurological:     Mental Status: She is alert. Mental status is at baseline.  Psychiatric:        Mood and Affect: Mood normal.        Behavior: Behavior  normal.        Lab Results  Component Value Date   WBC 6.3 11/04/2021   HGB 11.5 (L) 11/04/2021   HCT 35.2 (L) 11/04/2021   PLT 193 11/04/2021   GLUCOSE 167 (H) 11/04/2021   CHOL 120 04/22/2020   TRIG 211.0 (H) 04/22/2020   HDL 29.40 (L) 04/22/2020   LDLDIRECT 64.0 04/22/2020   LDLCALC 55 07/20/2016   ALT 19 11/04/2021   AST 18 11/04/2021   NA 140 11/04/2021   K 4.5 11/04/2021   CL 109 11/04/2021   CREATININE 0.79 11/04/2021   BUN 19 11/04/2021   CO2 27 11/04/2021   TSH 0.013 (L) 03/17/2021   INR 0.9 10/28/2019   HGBA1C 6.9 (H) 10/26/2020   MICROALBUR 1.3 04/22/2020     Assessment & Plan:    See Problem List for Assessment and Plan of chronic medical problems.

## 2021-11-18 ENCOUNTER — Ambulatory Visit (INDEPENDENT_AMBULATORY_CARE_PROVIDER_SITE_OTHER): Payer: Medicare HMO | Admitting: Internal Medicine

## 2021-11-18 ENCOUNTER — Encounter: Payer: Self-pay | Admitting: Internal Medicine

## 2021-11-18 VITALS — BP 118/70 | HR 95 | Temp 98.1°F | Ht 65.0 in | Wt 188.0 lb

## 2021-11-18 DIAGNOSIS — E7849 Other hyperlipidemia: Secondary | ICD-10-CM

## 2021-11-18 DIAGNOSIS — J069 Acute upper respiratory infection, unspecified: Secondary | ICD-10-CM | POA: Diagnosis not present

## 2021-11-18 DIAGNOSIS — Z23 Encounter for immunization: Secondary | ICD-10-CM

## 2021-11-18 DIAGNOSIS — J4520 Mild intermittent asthma, uncomplicated: Secondary | ICD-10-CM

## 2021-11-18 DIAGNOSIS — E1165 Type 2 diabetes mellitus with hyperglycemia: Secondary | ICD-10-CM | POA: Diagnosis not present

## 2021-11-18 DIAGNOSIS — E038 Other specified hypothyroidism: Secondary | ICD-10-CM | POA: Diagnosis not present

## 2021-11-18 MED ORDER — FLUTICASONE-SALMETEROL 500-50 MCG/ACT IN AEPB: 1.0000 | INHALATION_SPRAY | Freq: Two times a day (BID) | RESPIRATORY_TRACT | 3 refills | Status: DC

## 2021-11-18 MED ORDER — RYBELSUS 3 MG PO TABS: 3.0000 mg | ORAL_TABLET | Freq: Every day | ORAL | 5 refills | Status: DC

## 2021-11-18 MED ORDER — FLUTICASONE-SALMETEROL 500-50 MCG/ACT IN AEPB: 1.0000 | INHALATION_SPRAY | Freq: Two times a day (BID) | RESPIRATORY_TRACT | 8 refills | Status: DC

## 2021-11-18 NOTE — Assessment & Plan Note (Addendum)
Chronic Mild, persistent Not ideally controlled  Increase advair 500-50 1 puff twice daily Albuterol inhaler as needed Continue singular 10 mg daily, Zyrtec 10 mg daily

## 2021-11-18 NOTE — Assessment & Plan Note (Signed)
Chronic  Clinically euthyroid Check tsh and will titrate med dose if needed Currently taking levothyroxine 150 mcg daily 

## 2021-11-18 NOTE — Assessment & Plan Note (Signed)
Chronic Regular exercise and healthy diet encouraged Check lipid panel  Continue atorvastatin 10 mg daily 

## 2021-11-18 NOTE — Assessment & Plan Note (Signed)
Acute Symptoms likely viral in nature Continue symptomatic treatment with over-the-counter cold medications, Tylenol/ibuprofen Inc advair to 500-50 Increase rest and fluids Call if symptoms worsen or do not improve

## 2021-11-18 NOTE — Assessment & Plan Note (Addendum)
Chronic  Lab Results  Component Value Date   HGBA1C 6.9 (H) 10/26/2020    Stopped metformin - felt better w/o it - no true side effects Sugars  controlled Testing sugars 1 times a day Check A1c, urine microalbumin today Continue glimepiride 2 mg daily Stressed regular exercise, diabetic diet

## 2021-12-14 ENCOUNTER — Telehealth: Payer: Self-pay

## 2021-12-14 MED ORDER — RYBELSUS 7 MG PO TABS: 7.0000 mg | ORAL_TABLET | Freq: Every day | ORAL | 2 refills | Status: DC

## 2021-12-14 NOTE — Telephone Encounter (Signed)
Yes - rybelsus 7 mg sent to pharmacy.  Ok to double up on what she has and take 6 mg and use it up.    The highest dose is 14 mg so can increase after one month if she wants.  She can just let us know

## 2021-12-14 NOTE — Telephone Encounter (Signed)
Patient is calling in wanting to know if Dr.Burns would increase Semaglutide (RYBELSUS) 3 MG TABS, her blood sugar has been running high. For example this morning she took the medication as soon as she woke up and ate a bowl of cereal and took her blood sugar which read in 200's.

## 2021-12-16 NOTE — Telephone Encounter (Signed)
Left message for patient today with info. 

## 2021-12-18 ENCOUNTER — Other Ambulatory Visit: Payer: Self-pay | Admitting: Internal Medicine

## 2022-01-31 ENCOUNTER — Inpatient Hospital Stay: Payer: Medicare HMO | Attending: Hematology

## 2022-01-31 ENCOUNTER — Other Ambulatory Visit: Payer: Self-pay

## 2022-01-31 DIAGNOSIS — C7A012 Malignant carcinoid tumor of the ileum: Secondary | ICD-10-CM | POA: Diagnosis not present

## 2022-01-31 DIAGNOSIS — E538 Deficiency of other specified B group vitamins: Secondary | ICD-10-CM

## 2022-01-31 DIAGNOSIS — C7B04 Secondary carcinoid tumors of peritoneum: Secondary | ICD-10-CM | POA: Insufficient documentation

## 2022-01-31 DIAGNOSIS — C7B8 Other secondary neuroendocrine tumors: Secondary | ICD-10-CM

## 2022-01-31 DIAGNOSIS — C7B09 Secondary carcinoid tumors of other sites: Secondary | ICD-10-CM | POA: Insufficient documentation

## 2022-01-31 DIAGNOSIS — R197 Diarrhea, unspecified: Secondary | ICD-10-CM | POA: Insufficient documentation

## 2022-01-31 LAB — CMP (CANCER CENTER ONLY)
ALT: 16 U/L (ref 0–44)
AST: 16 U/L (ref 15–41)
Albumin: 3.7 g/dL (ref 3.5–5.0)
Alkaline Phosphatase: 117 U/L (ref 38–126)
Anion gap: 6 (ref 5–15)
BUN: 14 mg/dL (ref 8–23)
CO2: 27 mmol/L (ref 22–32)
Calcium: 9.2 mg/dL (ref 8.9–10.3)
Chloride: 108 mmol/L (ref 98–111)
Creatinine: 0.85 mg/dL (ref 0.44–1.00)
GFR, Estimated: >60 mL/min (ref >60)
Glucose, Bld: 131 mg/dL — ABNORMAL HIGH (ref 70–99)
Potassium: 4 mmol/L (ref 3.5–5.1)
Sodium: 141 mmol/L (ref 135–145)
Total Bilirubin: 0.4 mg/dL (ref 0.3–1.2)
Total Protein: 6.6 g/dL (ref 6.5–8.1)

## 2022-01-31 LAB — CBC WITH DIFFERENTIAL (CANCER CENTER ONLY)
Abs Immature Granulocytes: 0.01 10*3/uL (ref 0.00–0.07)
Basophils Absolute: 0.1 10*3/uL (ref 0.0–0.1)
Basophils Relative: 1 %
Eosinophils Absolute: 0.3 10*3/uL (ref 0.0–0.5)
Eosinophils Relative: 4 %
HCT: 38.7 % (ref 36.0–46.0)
Hemoglobin: 12.5 g/dL (ref 12.0–15.0)
Immature Granulocytes: 0 %
Lymphocytes Relative: 26 %
Lymphs Abs: 1.9 10*3/uL (ref 0.7–4.0)
MCH: 29 pg (ref 26.0–34.0)
MCHC: 32.3 g/dL (ref 30.0–36.0)
MCV: 89.8 fL (ref 80.0–100.0)
Monocytes Absolute: 0.4 10*3/uL (ref 0.1–1.0)
Monocytes Relative: 6 %
Neutro Abs: 4.5 10*3/uL (ref 1.7–7.7)
Neutrophils Relative %: 63 %
Platelet Count: 219 10*3/uL (ref 150–400)
RBC: 4.31 MIL/uL (ref 3.87–5.11)
RDW: 12.6 % (ref 11.5–15.5)
WBC Count: 7.1 10*3/uL (ref 4.0–10.5)
nRBC: 0 % (ref 0.0–0.2)

## 2022-01-31 LAB — VITAMIN B12: Vitamin B-12: 836 pg/mL (ref 180–914)

## 2022-02-02 DIAGNOSIS — D519 Vitamin B12 deficiency anemia, unspecified: Secondary | ICD-10-CM | POA: Insufficient documentation

## 2022-02-02 DIAGNOSIS — D649 Anemia, unspecified: Secondary | ICD-10-CM | POA: Insufficient documentation

## 2022-02-02 LAB — CHROMOGRANIN A: Chromogranin A (ng/mL): 173.9 ng/mL — ABNORMAL HIGH (ref 0.0–101.8)

## 2022-02-02 NOTE — Progress Notes (Deleted)
[place]   [phone] [phone]   Clinic Follow up Note   Patient Care Team: Binnie Rail, MD as PCP - General (Internal Medicine) Truitt Merle, MD as Consulting Physician (Hematology) Jackolyn Confer, MD as Consulting Physician (General Surgery) Szabat, Darnelle Maffucci, [facility] (Inactive) as Pharmacist (Pharmacist) Danice Goltz, MD as Consulting Physician (Ophthalmology)  Date of Service:  02/02/2022  CHIEF COMPLAINT: f/u of metastatic carcinoid tumor   CURRENT THERAPY:  Surveillance   ASSESSMENT: *** Jessica Farrell is a 76 y.o. female with   No problem-specific Assessment & Plan notes found for this encounter.  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 negative margines.   02/03/2014 Surgery   Laparoscopic-assisted right colectomy and resection of distal ileum   02/03/2014 Initial Diagnosis   Metastatic malignant neuroendocrine tumor of the ileocecal valve to regional lymph nodes.    05/31/2015 Imaging   CT A/P w contrast  IMPRESSION: 1. No evidence of metastatic disease. 2. Question mild basilar subpleural pulmonary fibrosis.   09/11/2019 Imaging   CT AP W contrast    IMPRESSION: 1. Redemonstrated postoperative findings of ileocolectomy and reanastomosis. 2. There are prominent lymph nodes in the right mesocolon which are slightly increased in size compared to prior examination,  the largest node measuring 1.8 x 0.7 cm, previously 1.5 x 0.4 cm. Findings are nonspecific although concerning for nodal metastatic disease. 3. There are additional new small nodules of the transverse mesocolon or omentum measuring up to 9 mm, likewise concerning for nodal or omental metastatic disease. 4. Redemonstrated broad-based midline, fat containing ventral hernia components. 5. Fluid in the endometrial cavity, abnormal in the late postmenopausal setting although unchanged compared to prior examination. 6. Status post interval cholecystectomy. 7. Aortic Atherosclerosis (ICD10-I70.0).   10/06/2019 PET scan   IMPRESSION: 1. Several small nodules in the peritoneal space and retroperitoneal space with intense radiotracer activity consistent with metastatic well differentiated neuroendocrine tumor. 2. Several small liver lesions above background activity are indeterminate. Consider MRI liver with and without contrast. 3. Radiotracer activity within mediastinal lymph nodes and bilateral pulmonary nodules consistent with metastatic well-differentiated neuroendocrine tumor to the lungs. Activity is very low within these lesions but measurable. 4. No clear evidence skeletal metastasis. Single lesion in the posterior LEFT SI joint warrants attention on follow-up. 5. Although there are multiple sites of metastatic disease, the overall tumor burden of metastatic disease very small.   10/28/2019 Relapse/Recurrence   FINAL MICROSCOPIC DIAGNOSIS:   A. PERITONEAL NODULES, BIOPSY:  -  Well-differentiated neuroendocrine tumor (carcinoid)  -  See comment   COMMENT:   By immunohistochemistry, the neoplastic cells are positive for CD56,  chromogranin and synaptophysin with a low proliferative rate by Ki-67  (less than 1%).  These findings are consistent with metastasis of the  patient's previously diagnosed small bowel carcinoid.  These findings  were discussed with Dr. Morey Hummingbird on October 30, 2019.    11/06/2019 -  Chemotherapy  First-line Monthly Sandostatin injection starting 11/06/19. Held since 01/05/20 due to poor toleration ( hypoglycemia, fatigue, and the episode of syncope and fall right after the injection. The diarrhea and flushing has, but overall symptoms are mild)    05/03/2020 Imaging   CT CAP  IMPRESSION: 1. Stable exam. No new or progressive interval findings. 2. Scattered tiny lung nodules are stable since PET-CT of 10/06/2019. These were hypermetabolic on that exam consistent with metastatic disease. 3. Multiple small omental and mesenteric soft tissue nodules are similar to prior. These were also noted to be hypermetabolic on the PET study. 4. Status post ileocolectomy. 5. Left colonic diverticulosis without diverticulitis. 6. Aortic Atherosclerosis (ICD10-I70.0).     02/08/2021 Imaging   EXAM: CT CHEST, ABDOMEN, AND PELVIS WITH CONTRAST  IMPRESSION: 1. Three of the pulmonary nodules have intervally enlarged, suggesting mild progression of malignancy. The remaining pulmonary nodules in the omental nodules appear stable, and no definite new nodule is identified. 2. Other imaging findings of potential clinical significance: Aortic Atherosclerosis (ICD10-I70.0). Coronary atherosclerosis. Systemic atherosclerosis. Complex ventral supraumbilical hernia with multiple defects and lobulations. Lumbar spondylosis and degenerative disc disease   07/28/2021 Imaging   EXAM: CT CHEST, ABDOMEN, AND PELVIS WITH CONTRAST  IMPRESSION: 1. Slight increased size of several pulmonary nodules, as detailed above, concerning for mild progression of metastatic disease to the lungs. In addition, there are findings in the left lower lobe which could be of infectious or inflammatory etiology, although the possibility of developing lymphangitic spread of tumor in the left lower lobe is not excluded. Further clinical evaluation and attention on follow-up studies is  recommended. 2. Multiple small soft tissue human plants in the peritoneal cavity appear very similar to the prior examination. No other new signs of metastatic disease noted elsewhere in the abdomen or pelvis. 3. Aortic atherosclerosis, in addition to left anterior descending coronary artery disease. Assessment for potential risk factor modification, dietary therapy or pharmacologic therapy may be warranted, if clinically indicated. 4. Colonic diverticulosis without evidence of acute diverticulitis at this time. 5. Additional incidental findings, similar to prior study, as above.      INTERVAL HISTORY: *** Jessica Farrell is here for a follow up of metastatic carcinoid tumor  She was last seen by me on 08/04/21 She presents to the clinic .    All other systems were reviewed with the patient and are negative.  MEDICAL HISTORY:  Past Medical History:  Diagnosis Date   Asthma    Cellulitis March  2014   Left foot and ankle   colon ca dx'd 01/2014   Colon polyps    2015    Diabetes mellitus ([place]) 05/05/2012   Type II   Hyperlipidemia    Morbid obesity ([place])    Thyroid disease 1990's   HypoThyroidism    SURGICAL HISTORY: Past Surgical History:  Procedure Laterality Date   CHOLECYSTECTOMY N/A 05/02/2017   Procedure: LAPAROSCOPIC CHOLECYSTECTOMY WITH INTRAOPERATIVE CHOLANGIOGRAM, VENTRAL HERNIA REPAIR;  Surgeon: Jovita Kussmaul, MD;  Location: [facility];  Service: General;  Laterality: N/A;   COLON RESECTION N/A 02/03/2014   Procedure: LAPAROSCOPIC ASSISTED BOWEL RESECTION;  Surgeon: Jackolyn Confer, MD;  Location: [facility];  Service: General;  Laterality: N/A;   FRACTURE SURGERY  2000   Ankle   TOOTH EXTRACTION     wisdom Teeth    I have reviewed the social history and family history with the patient and they are unchanged from previous note.  ALLERGIES:  is allergic to penicillins.  MEDICATIONS:  Current Outpatient Medications  Medication Sig Dispense Refill   albuterol  (VENTOLIN HFA) 108 (90 Base) MCG/ACT inhaler INHALE 2 PUFFS EVERY 6 HOURS AS NEEDED FOR WHEEZING OR SHORTNESS OF BREATH 54 g 1   atorvastatin (LIPITOR) 10 MG tablet TAKE 1 TABLET EVERY DAY 90 tablet 1   cetirizine (ZYRTEC) 10 MG tablet Take 1 tablet (10 mg total) by mouth daily. 15 tablet 0   erythromycin ophthalmic ointment 3 (three) times daily.     fluticasone-salmeterol (ADVAIR DISKUS) 500-50 MCG/ACT AEPB Inhale 1 puff into the lungs in the morning and at bedtime. 180 each 3   gabapentin (NEURONTIN) 100 MG capsule Take 1 capsule (100 mg total) by mouth at bedtime. 90 capsule 1   glimepiride (AMARYL) 2 MG tablet TAKE 1 TABLET BY MOUTH ONCE DAILY BEFORE BREAKFAST 30 tablet 0   glucose blood test strip Based on insurance preference. Use as instructed once daily to check sugars. Dx E11.9 100 each 12   Lancets (ONETOUCH ULTRASOFT) lancets Use as instructed once daily to check sugars. Dx E11.9 100 each 12   levothyroxine (SYNTHROID) 150 MCG tablet Take 1 tablet (150 mcg total) by mouth daily. 90 tablet 3   loperamide (IMODIUM) 1 MG/5ML solution Take by mouth as needed for diarrhea or loose stools.     metFORMIN (GLUCOPHAGE-XR) 750 MG 24 hr tablet TAKE 2 TABLETS EVERY DAY WITH SUPPER 180 tablet 10   montelukast (SINGULAIR) 10 MG tablet TAKE 1 TABLET AT BEDTIME 90 tablet 1   naproxen (NAPROSYN) 375 MG tablet Take 1 tablet (375 mg total) by mouth 2 (two) times daily. 20 tablet 0   Prenatal Vit-Fe Fumarate-FA (PRENATAL VITAMINS PO) Take by mouth.     Semaglutide (RYBELSUS) 7 MG TABS Take 7 mg by mouth daily. 30 tablet 2   No current facility-administered medications for this visit.    PHYSICAL EXAMINATION: ECOG PERFORMANCE STATUS: {CHL ONC ECOG PS:[id number]}  There were no vitals filed for this visit. Wt Readings from Last 3 Encounters:  11/18/21 188 lb (85.3 kg)  11/04/21 190 lb 9.6 oz (86.5 kg)  08/04/21 183 lb 6.4 oz (83.2 kg)    {Only keep what was examined. If exam not performed, can use  .CEXAM } GENERAL:alert, no distress and comfortable SKIN: skin color, texture, turgor are normal, no rashes or significant lesions EYES: normal, Conjunctiva are pink and non-injected, sclera clear {OROPHARYNX:no exudate, no erythema and lips, buccal mucosa, and tongue normal}  NECK: supple, thyroid normal size, non-tender, without nodularity LYMPH:  no palpable lymphadenopathy in the cervical, axillary {or inguinal} LUNGS: clear to auscultation and percussion with normal breathing effort HEART: regular rate & rhythm and no murmurs and no lower extremity edema ABDOMEN:abdomen soft, non-tender and normal bowel sounds Musculoskeletal:no cyanosis of digits and no clubbing  NEURO: alert & oriented x 3 with fluent speech, no focal motor/sensory deficits  LABORATORY DATA:  I have reviewed the data as listed    Latest Ref Rng & Units 01/31/2022    8:33 AM 11/04/2021    8:19 AM 07/19/2021    9:41 AM  CBC  WBC 4.0 - 10.5 K/uL 7.1  6.3  7.4   Hemoglobin 12.0 - 15.0 g/dL 12.5  11.5  11.5   Hematocrit 36.0 - 46.0 % 38.7  35.2  35.0   Platelets 150 - 400 K/uL 219  193  214         Latest Ref Rng & Units 01/31/2022    8:33 AM 11/04/2021  8:19 AM 07/19/2021    9:41 AM  CMP  Glucose 70 - 99 mg/dL 131  167  109   BUN 8 - 23 mg/dL _0 Creatinine 0.44 - 1.00 mg/dL 0.85  0.79  0.83   Sodium 135 - 145 mmol/L 141  140  139   Potassium 3.5 - 5.1 mmol/L 4.0  4.5  4.1   Chloride 98 - 111 mmol/L 108  109  107   CO2 22 - 32 mmol/L _1 Calcium 8.9 - 10.3 mg/dL 9.2  9.1  9.0   Total Protein 6.5 - 8.1 g/dL 6.6  6.8  6.7   Total Bilirubin 0.3 - 1.2 mg/dL 0.4  0.4  0.2   Alkaline Phos 38 - 126 U/L 117  120  133   AST 15 - 41 U/L _2 ALT 0 - 44 U/L _3 RADIOGRAPHIC STUDIES: I have personally reviewed the radiological images as listed and agreed with the findings in the report. No results found.    No orders of the defined types were placed in this  encounter.  All questions were answered. The patient knows to call the clinic with any problems, questions or concerns. No barriers to learning was detected. The total time spent in the appointment was {CHL ONC TIME VISIT - [id number]}.     Baldemar Friday, CMA 02/02/2022   I, Audry Riles, CMA, am acting as scribe for Truitt Merle, MD.   {Add scribe attestation statement}

## 2022-02-02 NOTE — Assessment & Plan Note (Deleted)
pT3 N1 M0, stage IIIB, peritoneal metastasis 09/2019 -diagnosed 01/2014, s/p resection 02/03/14 showing 3 cm NET involving serosal tissue. Margins negative, but 5/25 positive nodes with extracapsular extension. -10/06/19 [facility] PET showed metastatic disease to peritoneum, abdominal and mediastinal lymph nodes, and lungs. Indeterminate liver lesions. 10/29/19 peritoneal biopsy confirmed well-differentiated neuroendocrine tumor. -she took Sandostatin injections monthly 11/06/19 - 01/05/20, discontinued due to hyperglycemia, fatigue, and an episode of syncope and fall right after the injection. -CT CAP on 02/08/21 showed: enlargement of three pulmonary nodules; stable peritoneal and liver mets. -for symptom management, she took one dose lanreotide on 02/24/21. This improved her diarrhea, but she developed vision issues in her right eye, so stopped after that.  -restaging CT CAP 07/28/21 showed: slight increase in pulmonary nodules; otherwise no new signs of metastatic disease. Will continue to monitor given poor tolerance of prior treatment.

## 2022-02-02 NOTE — Assessment & Plan Note (Deleted)
-  continue meds

## 2022-02-02 NOTE — Assessment & Plan Note (Deleted)
-  New onset mild anemia with Hgb of 10.7 on 08/05/20 -she switched to prenatal vitamins after that visit. She was previously on B12 inj q4 weeks for a few months, on oral B12 now -recently repeated B12 level normal

## 2022-02-03 ENCOUNTER — Other Ambulatory Visit: Payer: Self-pay | Admitting: *Deleted

## 2022-02-03 ENCOUNTER — Inpatient Hospital Stay: Payer: Medicare HMO | Admitting: Hematology

## 2022-02-03 DIAGNOSIS — C7B04 Secondary carcinoid tumors of peritoneum: Secondary | ICD-10-CM | POA: Diagnosis not present

## 2022-02-03 DIAGNOSIS — C7B8 Other secondary neuroendocrine tumors: Secondary | ICD-10-CM

## 2022-02-03 DIAGNOSIS — C7B09 Secondary carcinoid tumors of other sites: Secondary | ICD-10-CM | POA: Diagnosis not present

## 2022-02-03 DIAGNOSIS — E1165 Type 2 diabetes mellitus with hyperglycemia: Secondary | ICD-10-CM

## 2022-02-03 DIAGNOSIS — R197 Diarrhea, unspecified: Secondary | ICD-10-CM | POA: Diagnosis not present

## 2022-02-03 DIAGNOSIS — C7A012 Malignant carcinoid tumor of the ileum: Secondary | ICD-10-CM | POA: Diagnosis not present

## 2022-02-03 DIAGNOSIS — D518 Other vitamin B12 deficiency anemias: Secondary | ICD-10-CM

## 2022-02-08 ENCOUNTER — Ambulatory Visit (HOSPITAL_COMMUNITY)
Admission: RE | Admit: 2022-02-08 | Discharge: 2022-02-08 | Disposition: A | Discharge status: Home / Self Care | Payer: Medicare HMO | Source: Ambulatory Visit | Attending: Hematology | Admitting: Hematology

## 2022-02-08 DIAGNOSIS — K439 Ventral hernia without obstruction or gangrene: Secondary | ICD-10-CM | POA: Diagnosis not present

## 2022-02-08 DIAGNOSIS — I2510 Atherosclerotic heart disease of native coronary artery without angina pectoris: Secondary | ICD-10-CM | POA: Diagnosis not present

## 2022-02-08 DIAGNOSIS — R918 Other nonspecific abnormal finding of lung field: Secondary | ICD-10-CM | POA: Insufficient documentation

## 2022-02-08 DIAGNOSIS — Z9049 Acquired absence of other specified parts of digestive tract: Secondary | ICD-10-CM | POA: Diagnosis not present

## 2022-02-08 DIAGNOSIS — C7A8 Other malignant neuroendocrine tumors: Secondary | ICD-10-CM | POA: Diagnosis not present

## 2022-02-08 DIAGNOSIS — C7B8 Other secondary neuroendocrine tumors: Secondary | ICD-10-CM | POA: Diagnosis not present

## 2022-02-08 DIAGNOSIS — I700 Atherosclerosis of aorta: Secondary | ICD-10-CM | POA: Insufficient documentation

## 2022-02-08 DIAGNOSIS — K5730 Diverticulosis of large intestine without perforation or abscess without bleeding: Secondary | ICD-10-CM | POA: Diagnosis not present

## 2022-02-08 DIAGNOSIS — Z8603 Personal history of neoplasm of uncertain behavior: Secondary | ICD-10-CM | POA: Diagnosis not present

## 2022-02-08 DIAGNOSIS — K6389 Other specified diseases of intestine: Secondary | ICD-10-CM | POA: Diagnosis not present

## 2022-02-08 LAB — 5 HIAA, QUANTITATIVE, URINE, 24 HOUR
5-HIAA, Ur: 15.4 mg/L
5-HIAA,Quant.,24 Hr Urine: 8.5 mg/24 hr (ref 0.0–14.9)
Total Volume: 550

## 2022-02-08 MED ORDER — IOHEXOL 300 MG/ML  SOLN
100.0000 mL | Freq: Once | INTRAMUSCULAR | Status: AC | PRN
  Administered 2022-02-08: 100 mL via INTRAVENOUS

## 2022-02-09 ENCOUNTER — Other Ambulatory Visit (HOSPITAL_COMMUNITY): Payer: Medicare HMO

## 2022-02-10 ENCOUNTER — Inpatient Hospital Stay (HOSPITAL_BASED_OUTPATIENT_CLINIC_OR_DEPARTMENT_OTHER): Payer: Medicare HMO | Admitting: Hematology

## 2022-02-10 DIAGNOSIS — C7B8 Other secondary neuroendocrine tumors: Secondary | ICD-10-CM

## 2022-02-10 NOTE — Progress Notes (Unsigned)
[place]   [phone] Fax:(336) [phone]   Clinic Follow up Note   Patient Care Team: Binnie Rail, MD as PCP - General (Internal Medicine) Truitt Merle, MD as Consulting Physician (Hematology) Jackolyn Confer, MD as Consulting Physician (General Surgery) Szabat, Darnelle Maffucci, [facility] (Inactive) as Pharmacist (Pharmacist) Danice Goltz, MD as Consulting Physician (Ophthalmology)  Date of Service:  02/10/2022  I connected with Jessica Farrell on 02/10/2022 at  3:20 PM EST by telephone visit and verified that I am speaking with the correct person using two identifiers.  I discussed the limitations, risks, security and privacy concerns of performing an evaluation and management service by telephone and the availability of in person appointments. I also discussed with the patient that there may be a patient responsible charge related to this service. The patient expressed understanding and agreed to proceed.   Other persons participating in the visit and their role in the encounter:  No  Patient's location:  Work Provider's location:  Office  CHIEF COMPLAINT: f/u of metastatic carcinoid tumor   CURRENT THERAPY:  Surveillance   ASSESSMENT & PLAN:  Jessica Farrell is a 76 y.o. female with   1. Well differentiated low-grade neuroendocrine tumor of terminal ileum (carcinoid tumor), pT3 N1 M0, stage IIIB, peritoneal metastasis 09/2019 -diagnosed 01/2014, s/p resection 02/03/14 showing 3 cm NET involving serosal tissue. Margins negative, but 5/25 positive nodes with extracapsular extension. -10/06/19 Dotatate PET showed metastatic disease to peritoneum, abdominal and mediastinal lymph nodes, and lungs. Indeterminate liver lesions. 10/29/19 peritoneal biopsy confirmed well-differentiated neuroendocrine tumor. -she took Sandostatin injections monthly 11/06/19 - 01/05/20, discontinued due to hyperglycemia, fatigue, and an episode of syncope and fall right after the  injection. -for symptom management, she took one dose lanreotide on 02/24/21. This improved her diarrhea, but she developed vision issues in her right eye, and stopped -restaging CT CAP 02/08/2022 showed stable to slightly increased pulmonary nodules and peritoneal mets; otherwise no new signs of metastatic disease. Will continue to monitor given poor tolerance of prior treatment. -we discussed alternative therapy with oral Afinitor, this continue observation.  Given her overall low disease burden, mild symptoms, will plan to continue observation. -Follow-up in 3 months.   2. Cough -she has never smoked -Mild cough, not bothersome -maybe related to her pulmonary metastasis   2. Visual disturbance -initially noted shortly after lanreotide injection on 02/24/21. -describes as "blurry and dim," mainly in her right eye -03/17/21 Head CT and brain MRI were normal.  -S/p right temporal artery biopsy 03/23/21, path was negative per pt -followed by Dr. Tama High ophthalmology -stable      PLAN: - Discuss CT Scan findings, overall stable to mild progression  -Continue to Monitor -F/u in 42mhs    SUMMARY OF ONCOLOGIC HISTORY: Oncology History Overview Note  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   Staging form: Colon and Rectum, AJCC 7th Edition     Clinical: T3, N2, M0 - Unsigned     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 ([place]  01/21/2014 Imaging   CT: Distal small bowel obstruction due to 3.4 cm soft tissue mass near the ileocecal valve, with adjacent right lower quadrant mesenteric lymphadenopathy   02/03/2014 Pathologic Stage   invasive well diff neuroendocrine tumor (carcinoid), 3cm, pT3pN2 (5/25 nodes positive). negative margines.   02/03/2014 Surgery   Laparoscopic-assisted right colectomy and resection of distal ileum   02/03/2014 Initial Diagnosis   Metastatic malignant neuroendocrine tumor of the ileocecal valve to regional lymph  nodes.     05/31/2015 Imaging   CT A/P w contrast  IMPRESSION: 1. No evidence of metastatic disease. 2. Question mild basilar subpleural pulmonary fibrosis.   09/11/2019 Imaging   CT AP W contrast    IMPRESSION: 1. Redemonstrated postoperative findings of ileocolectomy and reanastomosis. 2. There are prominent lymph nodes in the right mesocolon which are slightly increased in size compared to prior examination, the largest node measuring 1.8 x 0.7 cm, previously 1.5 x 0.4 cm. Findings are nonspecific although concerning for nodal metastatic disease. 3. There are additional new small nodules of the transverse mesocolon or omentum measuring up to 9 mm, likewise concerning for nodal or omental metastatic disease. 4. Redemonstrated broad-based midline, fat containing ventral hernia components. 5. Fluid in the endometrial cavity, abnormal in the late postmenopausal setting although unchanged compared to prior examination. 6. Status post interval cholecystectomy. 7. Aortic Atherosclerosis (ICD10-I70.0).   10/06/2019 PET scan   IMPRESSION: 1. Several small nodules in the peritoneal space and retroperitoneal space with intense radiotracer activity consistent with metastatic well differentiated neuroendocrine tumor. 2. Several small liver lesions above background activity are indeterminate. Consider MRI liver with and without contrast. 3. Radiotracer activity within mediastinal lymph nodes and bilateral pulmonary nodules consistent with metastatic well-differentiated neuroendocrine tumor to the lungs. Activity is very low within these lesions but measurable. 4. No clear evidence skeletal metastasis. Single lesion in the posterior LEFT SI joint warrants attention on follow-up. 5. Although there are multiple sites of metastatic disease, the overall tumor burden of metastatic disease very small.   10/28/2019 Relapse/Recurrence   FINAL MICROSCOPIC DIAGNOSIS:   A. PERITONEAL NODULES, BIOPSY:   -  Well-differentiated neuroendocrine tumor (carcinoid)  -  See comment   COMMENT:   By immunohistochemistry, the neoplastic cells are positive for CD56,  chromogranin and synaptophysin with a low proliferative rate by Ki-67  (less than 1%).  These findings are consistent with metastasis of the  patient's previously diagnosed small bowel carcinoid.  These findings  were discussed with Dr. Morey Hummingbird on October 30, 2019.    11/06/2019 -  Chemotherapy   First-line Monthly Sandostatin injection starting 11/06/19. Held since 01/05/20 due to poor toleration ( hypoglycemia, fatigue, and the episode of syncope and fall right after the injection. The diarrhea and flushing has, but overall symptoms are mild)    05/03/2020 Imaging   CT CAP  IMPRESSION: 1. Stable exam. No new or progressive interval findings. 2. Scattered tiny lung nodules are stable since PET-CT of 10/06/2019. These were hypermetabolic on that exam consistent with metastatic disease. 3. Multiple small omental and mesenteric soft tissue nodules are similar to prior. These were also noted to be hypermetabolic on the PET study. 4. Status post ileocolectomy. 5. Left colonic diverticulosis without diverticulitis. 6. Aortic Atherosclerosis (ICD10-I70.0).     02/08/2021 Imaging   EXAM: CT CHEST, ABDOMEN, AND PELVIS WITH CONTRAST  IMPRESSION: 1. Three of the pulmonary nodules have intervally enlarged, suggesting mild progression of malignancy. The remaining pulmonary nodules in the omental nodules appear stable, and no definite new nodule is identified. 2. Other imaging findings of potential clinical significance: Aortic Atherosclerosis (ICD10-I70.0). Coronary atherosclerosis. Systemic atherosclerosis. Complex ventral supraumbilical hernia with multiple defects and lobulations. Lumbar spondylosis and degenerative disc disease   07/28/2021 Imaging   EXAM: CT CHEST, ABDOMEN, AND PELVIS WITH CONTRAST  IMPRESSION: 1. Slight increased  size of several pulmonary nodules, as detailed above, concerning for mild progression of metastatic disease to the lungs. In addition, there are findings in  the left lower lobe which could be of infectious or inflammatory etiology, although the possibility of developing lymphangitic spread of tumor in the left lower lobe is not excluded. Further clinical evaluation and attention on follow-up studies is recommended. 2. Multiple small soft tissue human plants in the peritoneal cavity appear very similar to the prior examination. No other new signs of metastatic disease noted elsewhere in the abdomen or pelvis. 3. Aortic atherosclerosis, in addition to left anterior descending coronary artery disease. Assessment for potential risk factor modification, dietary therapy or pharmacologic therapy may be warranted, if clinically indicated. 4. Colonic diverticulosis without evidence of acute diverticulitis at this time. 5. Additional incidental findings, similar to prior study, as above.      INTERVAL HISTORY:  Jessica Farrell was contacted for a follow up of  metastatic carcinoid tumor  . She was last seen by me on 11/04/2021.  Pt reports of having some diarrhea and she takes the Imodium. Pt states her her eye is still blurry and she dont expect it to get back to normal.   All other systems were reviewed with the patient and are negative.  MEDICAL HISTORY:  Past Medical History:  Diagnosis Date   Asthma    Cellulitis March  2014   Left foot and ankle   colon ca dx'd 01/2014   Colon polyps    2015    Diabetes mellitus ([place]) 05/05/2012   Type II   Hyperlipidemia    Morbid obesity ([place])    Thyroid disease 1990's   HypoThyroidism    SURGICAL HISTORY: Past Surgical History:  Procedure Laterality Date   CHOLECYSTECTOMY N/A 05/02/2017   Procedure: LAPAROSCOPIC CHOLECYSTECTOMY WITH INTRAOPERATIVE CHOLANGIOGRAM, VENTRAL HERNIA REPAIR;  Surgeon: Jovita Kussmaul, MD;  Location: [facility];   Service: General;  Laterality: N/A;   COLON RESECTION N/A 02/03/2014   Procedure: LAPAROSCOPIC ASSISTED BOWEL RESECTION;  Surgeon: Jackolyn Confer, MD;  Location: [facility];  Service: General;  Laterality: N/A;   FRACTURE SURGERY  2000   Ankle   TOOTH EXTRACTION     wisdom Teeth    I have reviewed the social history and family history with the patient and they are unchanged from previous note.  ALLERGIES:  is allergic to penicillins.  MEDICATIONS:  Current Outpatient Medications  Medication Sig Dispense Refill   albuterol (VENTOLIN HFA) 108 (90 Base) MCG/ACT inhaler INHALE 2 PUFFS EVERY 6 HOURS AS NEEDED FOR WHEEZING OR SHORTNESS OF BREATH 54 g 1   atorvastatin (LIPITOR) 10 MG tablet TAKE 1 TABLET EVERY DAY 90 tablet 1   cetirizine (ZYRTEC) 10 MG tablet Take 1 tablet (10 mg total) by mouth daily. 15 tablet 0   erythromycin ophthalmic ointment 3 (three) times daily.     fluticasone-salmeterol (ADVAIR DISKUS) 500-50 MCG/ACT AEPB Inhale 1 puff into the lungs in the morning and at bedtime. 180 each 3   gabapentin (NEURONTIN) 100 MG capsule Take 1 capsule (100 mg total) by mouth at bedtime. 90 capsule 1   glimepiride (AMARYL) 2 MG tablet TAKE 1 TABLET BY MOUTH ONCE DAILY BEFORE BREAKFAST 30 tablet 0   glucose blood test strip Based on insurance preference. Use as instructed once daily to check sugars. Dx E11.9 100 each 12   Lancets (ONETOUCH ULTRASOFT) lancets Use as instructed once daily to check sugars. Dx E11.9 100 each 12   levothyroxine (SYNTHROID) 150 MCG tablet Take 1 tablet (150 mcg total) by mouth daily. 90 tablet 3   loperamide (IMODIUM) 1 MG/5ML solution Take by  mouth as needed for diarrhea or loose stools.     metFORMIN (GLUCOPHAGE-XR) 750 MG 24 hr tablet TAKE 2 TABLETS EVERY DAY WITH SUPPER 180 tablet 10   montelukast (SINGULAIR) 10 MG tablet TAKE 1 TABLET AT BEDTIME 90 tablet 1   naproxen (NAPROSYN) 375 MG tablet Take 1 tablet (375 mg total) by mouth 2 (two) times daily. 20 tablet  0   Prenatal Vit-Fe Fumarate-FA (PRENATAL VITAMINS PO) Take by mouth.     Semaglutide (RYBELSUS) 7 MG TABS Take 7 mg by mouth daily. 30 tablet 2   No current facility-administered medications for this visit.    PHYSICAL EXAMINATION: ECOG PERFORMANCE STATUS: 1 - Symptomatic but completely ambulatory  There were no vitals filed for this visit. Wt Readings from Last 3 Encounters:  11/18/21 188 lb (85.3 kg)  11/04/21 190 lb 9.6 oz (86.5 kg)  08/04/21 183 lb 6.4 oz (83.2 kg)     No vitals taken today, Exam not performed today  LABORATORY DATA:  I have reviewed the data as listed    Latest Ref Rng & Units 01/31/2022    8:33 AM 11/04/2021    8:19 AM 07/19/2021    9:41 AM  CBC  WBC 4.0 - 10.5 K/uL 7.1  6.3  7.4   Hemoglobin 12.0 - 15.0 g/dL 12.5  11.5  11.5   Hematocrit 36.0 - 46.0 % 38.7  35.2  35.0   Platelets 150 - 400 K/uL 219  193  214         Latest Ref Rng & Units 01/31/2022    8:33 AM 11/04/2021    8:19 AM 07/19/2021    9:41 AM  CMP  Glucose 70 - 99 mg/dL 131  167  109   BUN 8 - 23 mg/dL _0 Creatinine 0.44 - 1.00 mg/dL 0.85  0.79  0.83   Sodium 135 - 145 mmol/L 141  140  139   Potassium 3.5 - 5.1 mmol/L 4.0  4.5  4.1   Chloride 98 - 111 mmol/L 108  109  107   CO2 22 - 32 mmol/L _1 Calcium 8.9 - 10.3 mg/dL 9.2  9.1  9.0   Total Protein 6.5 - 8.1 g/dL 6.6  6.8  6.7   Total Bilirubin 0.3 - 1.2 mg/dL 0.4  0.4  0.2   Alkaline Phos 38 - 126 U/L 117  120  133   AST 15 - 41 U/L _2 ALT 0 - 44 U/L _3 RADIOGRAPHIC STUDIES: I have personally reviewed the radiological images as listed and agreed with the findings in the report. CT CHEST ABDOMEN PELVIS W CONTRAST  Result Date: 02/09/2022 CLINICAL DATA:  Metastatic neuroendocrine tumor of the colon restaging * Tracking Code: BO * EXAM: CT CHEST, ABDOMEN, AND PELVIS WITH CONTRAST TECHNIQUE: Multidetector CT imaging of the chest, abdomen and pelvis was performed following the  standard protocol during bolus administration of intravenous contrast. RADIATION DOSE REDUCTION: This exam was performed according to the departmental dose-optimization program which includes automated exposure control, adjustment of the mA and/or kV according to patient size and/or use of iterative reconstruction technique. CONTRAST:  163m OMNIPAQUE IOHEXOL 300 MG/ML SOLN additional oral enteric contrast COMPARISON:  07/28/2021 FINDINGS: CT CHEST FINDINGS Cardiovascular: Scattered aortic atherosclerosis. Normal heart size. Left coronary artery calcifications. No pericardial effusion. Mediastinum/Nodes: Unchanged prominent pretracheal lymph nodes. No  overtly enlarged mediastinal, hilar, or axillary lymph nodes. Thyroid gland, trachea, and esophagus demonstrate no significant findings. Lungs/Pleura: Multiple small bilateral pulmonary nodules. These are stable to slightly enlarged compared to prior examination, for example a nodule of the medial right lower lobe measuring 1.4 x 1.1 cm, previously 1.3 x 0.9 cm (series 6, image 97) and a nodule of the dependent left lower lobe measuring 1.0 x 1.0 cm, previously 0.9 x 0.9 cm (series 6, image 90). Interval improvement of heterogeneous airspace opacity previously seen in the dependent left lung base, with residual mild, bibasilar predominant scarring or fibrosis (series 6, image 100). No pleural effusion or pneumothorax. Musculoskeletal: No chest wall abnormality. No acute osseous findings. CT ABDOMEN PELVIS FINDINGS Hepatobiliary: No focal liver abnormality is seen. Mildly coarse contour of the liver. Status post cholecystectomy. No biliary dilatation. Pancreas: Unremarkable. No pancreatic ductal dilatation or surrounding inflammatory changes. Spleen: Normal in size without significant abnormality. Adrenals/Urinary Tract: Adrenal glands are unremarkable. Kidneys are normal, without renal calculi, solid lesion, or hydronephrosis. Bladder is unremarkable. Stomach/Bowel:  Stomach is within normal limits. Unchanged postoperative appearance status post right hemicolectomy and ileocolic anastomosis. Sigmoid diverticulosis. Unchanged, chronic wall thickening of the mid and distal sigmoid colon (series 2, image 98). Vascular/Lymphatic: Aortic atherosclerosis. No enlarged abdominal or pelvic lymph nodes. Reproductive: No mass or other abnormality. Other: Unchanged, multi compartment midline ventral hernia, containing a partial, nonobstructed loop of transverse colon superiorly (series 2, image 70), and omental fat inferiorly, as well as multiple small soft tissue nodules, measuring up to 0.8 x 0.7 cm (series 2, image 78). Additional small peritoneal and omental nodules internal to the abdomen, for example in the ventral right hemiabdomen measuring 0.6 cm, unchanged (series 2, image 64). Peritoneal nodule at the right aspect of the cervix measuring 1.5 x 1.5 cm, slightly enlarged compared to prior examination, previously 1.2 x 1.0 cm (series 2, image 23). Musculoskeletal: No acute osseous findings. IMPRESSION: 1. Multiple small bilateral pulmonary nodules, stable to slightly enlarged compared to prior examination. 2. Peritoneal nodule at the right aspect of the cervix, slightly enlarged compared to prior examination. Additional small peritoneal and omental nodules both contained within a ventral hernia and internal to the abdomen are not appreciably changed. 3. Findings are consistent with very slightly worsened pulmonary and peritoneal metastatic disease. 4. Unchanged postoperative findings status post right hemicolectomy and ileocolic anastomosis. 5. Interval improvement of heterogeneous airspace opacity previously seen in the dependent left lung base, consistent with resolution of infection or aspiration. 6. Mildly coarse contour of the liver, suggestive of cirrhosis. Correlate with biochemical findings. 7. Coronary artery disease. Aortic Atherosclerosis (ICD10-I70.0). Electronically  Signed   By: Delanna Ahmadi M.D.   On: 02/09/2022 13:39      No orders of the defined types were placed in this encounter.  All questions were answered. The patient knows to call the clinic with any problems, questions or concerns. No barriers to learning was detected. The total time spent in the appointment was 22 minutes.     Truitt Merle, MD 02/10/2022   Felicity Coyer am acting as scribe for Truitt Merle, MD.   I have reviewed the above documentation for accuracy and completeness, and I agree with the above.

## 2022-02-11 ENCOUNTER — Encounter: Payer: Self-pay | Admitting: Hematology

## 2022-02-15 ENCOUNTER — Telehealth: Payer: Self-pay | Admitting: Hematology

## 2022-02-15 NOTE — Telephone Encounter (Signed)
Left patient a voicemail regarding upcoming appointments  

## 2022-02-24 ENCOUNTER — Ambulatory Visit
Admission: EM | Admit: 2022-02-24 | Discharge: 2022-02-24 | Disposition: A | Discharge status: Home / Self Care | Payer: Medicare HMO | Attending: Nurse Practitioner | Admitting: Nurse Practitioner

## 2022-02-24 DIAGNOSIS — J209 Acute bronchitis, unspecified: Secondary | ICD-10-CM

## 2022-02-24 DIAGNOSIS — J4520 Mild intermittent asthma, uncomplicated: Secondary | ICD-10-CM

## 2022-02-24 MED ORDER — PREDNISONE 20 MG PO TABS: 20.0000 mg | ORAL_TABLET | Freq: Every day | ORAL | 0 refills | Status: AC

## 2022-02-24 MED ORDER — BENZONATATE 100 MG PO CAPS: 100.0000 mg | ORAL_CAPSULE | Freq: Three times a day (TID) | ORAL | 0 refills | Status: DC

## 2022-02-24 MED ORDER — AZITHROMYCIN 250 MG PO TABS: 250.0000 mg | ORAL_TABLET | Freq: Every day | ORAL | 0 refills | Status: DC

## 2022-02-24 NOTE — Discharge Instructions (Signed)
Zithromax as prescribed Prednisone daily for 5 days Tessalon as needed for cough Rest and fluids Please follow-up with your PCP in 2 to 3 days for recheck Please go to the emergency room if you have any worsening symptoms

## 2022-02-24 NOTE — ED Triage Notes (Signed)
[facility] for one week and new onset of hoarseness today. Pt has been taking Nyquil and robitussin.

## 2022-02-24 NOTE — ED Provider Notes (Signed)
[name]    CSN: [id number] Arrival date & time: 02/24/22  1516      History   Chief Complaint Chief Complaint  Patient presents with   Cough   Hoarse    HPI Jessica Farrell is a 77 y.o. female who presents for evaluation of URI symptoms for 7 days. Patient reports associated symptoms of worsening cough, shortness of breath, laryngitis. Denies N/V/D, fevers, ear pain, body aches. Patient does have a hx of asthma.  She has a steroid inhaler and albuterol inhaler.  No smoking. No known sick contacts and no recent travel. Pt is vaccinated for COVID. Pt is vaccinated for flu this season. Pt has taken DayQuil and NyQuil OTC for symptoms. Pt has no other concerns at this time.     Cough   Past Medical History:  Diagnosis Date   Asthma    Cellulitis March  2014   Left foot and ankle   colon ca dx'd 01/2014   Colon polyps    2015    Diabetes mellitus ([place]) 05/05/2012   Type II   Hyperlipidemia    Morbid obesity ([place])    Thyroid disease 1990's   HypoThyroidism    Patient Active Problem List   Diagnosis Date Noted   B12 deficiency anemia 02/02/2022   Acute pain of right knee 05/18/2021   Aortic atherosclerosis ([place]) 04/22/2020   Tingling in extremities 05/23/2018   Osteopenia 98/92/1194   Umbilical hernia without obstruction or gangrene 11/21/2017   Onychomycosis 05/22/2017   Athlete's foot 05/22/2017   Carcinoid tumor of cecum 01/26/2017   Cough 06/06/2016   URI (upper respiratory infection) 06/06/2016   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 ([place]) 02/18/2014   Chronic venous insufficiency 08/02/2012   HLD (hyperlipidemia) 05/15/2012   Diabetes mellitus ([place]) 05/05/2012   Hypothyroidism 05/05/2012   Asthma 05/05/2012    Past Surgical History:  Procedure Laterality Date   CHOLECYSTECTOMY N/A 05/02/2017   Procedure: LAPAROSCOPIC CHOLECYSTECTOMY WITH INTRAOPERATIVE CHOLANGIOGRAM, VENTRAL HERNIA REPAIR;  Surgeon: Jovita Kussmaul, MD;   Location: [facility];  Service: General;  Laterality: N/A;   COLON RESECTION N/A 02/03/2014   Procedure: LAPAROSCOPIC ASSISTED BOWEL RESECTION;  Surgeon: Jackolyn Confer, MD;  Location: [facility];  Service: General;  Laterality: N/A;   FRACTURE SURGERY  2000   Ankle   TOOTH EXTRACTION     wisdom Teeth    OB History   No obstetric history on file.      Home Medications    Prior to Admission medications   Medication Sig Start Date End Date Taking? Authorizing Provider  azithromycin (ZITHROMAX) 250 MG tablet Take 1 tablet (250 mg total) by mouth daily. Take first 2 tablets together, then 1 every day until finished. 02/24/22  Yes Melynda Ripple, NP  benzonatate (TESSALON) 100 MG capsule Take 1 capsule (100 mg total) by mouth every 8 (eight) hours. 02/24/22  Yes Melynda Ripple, NP  predniSONE (DELTASONE) 20 MG tablet Take 1 tablet (20 mg total) by mouth daily with breakfast for 5 days. 02/24/22 03/01/22 Yes Melynda Ripple, NP  albuterol (VENTOLIN HFA) 108 (90 Base) MCG/ACT inhaler INHALE 2 PUFFS EVERY 6 HOURS AS NEEDED FOR WHEEZING OR SHORTNESS OF BREATH 08/26/21   Binnie Rail, MD  atorvastatin (LIPITOR) 10 MG tablet TAKE 1 TABLET EVERY DAY 09/26/21   Binnie Rail, MD  cetirizine (ZYRTEC) 10 MG tablet Take 1 tablet (10 mg total) by mouth daily. 01/18/20   Varney Biles, MD  erythromycin ophthalmic ointment 3 (three) times daily. 03/18/21   [provider]  fluticasone-salmeterol (ADVAIR DISKUS) 500-50 MCG/ACT AEPB Inhale 1 puff into the lungs in the morning and at bedtime. 11/18/21   Binnie Rail, MD  gabapentin (NEURONTIN) 100 MG capsule Take 1 capsule (100 mg total) by mouth at bedtime. 01/23/20   Binnie Rail, MD  glimepiride (AMARYL) 2 MG tablet TAKE 1 TABLET BY MOUTH ONCE DAILY BEFORE BREAKFAST 11/08/21   Binnie Rail, MD  glucose blood test strip Based on insurance preference. Use as instructed once daily to check sugars. Dx E11.9 06/04/18   Binnie Rail, MD  Lancets [facility] ULTRASOFT)  lancets Use as instructed once daily to check sugars. Dx E11.9 05/23/18   Binnie Rail, MD  levothyroxine (SYNTHROID) 150 MCG tablet Take 1 tablet (150 mcg total) by mouth daily. 02/17/21   Binnie Rail, MD  loperamide (IMODIUM) 1 MG/5ML solution Take by mouth as needed for diarrhea or loose stools.    [provider]  metFORMIN (GLUCOPHAGE-XR) 750 MG 24 hr tablet TAKE 2 TABLETS EVERY DAY WITH SUPPER 12/20/21   Burns, Claudina Lick, MD  montelukast (SINGULAIR) 10 MG tablet TAKE 1 TABLET AT BEDTIME 09/26/21   Binnie Rail, MD  naproxen (NAPROSYN) 375 MG tablet Take 1 tablet (375 mg total) by mouth 2 (two) times daily. 01/18/20   Varney Biles, MD  Prenatal Vit-Fe Fumarate-FA (PRENATAL VITAMINS PO) Take by mouth.    [provider]  Semaglutide (RYBELSUS) 7 MG TABS Take 7 mg by mouth daily. 12/14/21   Binnie Rail, MD    Family History Family History  Problem Relation Age of Onset   Alzheimer's disease Father    Heart disease Mother    Arthritis Mother    Hypertension Mother    Alzheimer's disease Sister    Cancer Sister 72       breast cancer    Heart disease Brother        Heart Disease before age 14   Breast cancer Sister    Colon cancer Neg Hx    Esophageal cancer Neg Hx    Rectal cancer Neg Hx    Stomach cancer Neg Hx     Social History Social History   Tobacco Use   Smoking status: Never   Smokeless tobacco: Never  Vaping Use   Vaping Use: Never used  Substance Use Topics   Alcohol use: No   Drug use: No     Allergies   Penicillins   Review of Systems Review of Systems  HENT:  Positive for congestion and voice change.   Respiratory:  Positive for cough.      Physical Exam Triage Vital Signs ED Triage Vitals [02/24/22 1631]  Enc Vitals Group     BP 130/84     Pulse Rate 87     Resp 19     Temp 98.3 F (36.8 C)     Temp src      SpO2 98 %     Weight      Height      Head Circumference      Peak Flow      Pain Score 0     Pain  Loc      Pain Edu?      Excl. in [place]?    No data found.  Updated Vital Signs BP 130/84   Pulse 87   Temp 98.3 F (36.8 C)   Resp  19   SpO2 98%   Visual Acuity Right Eye Distance:   Left Eye Distance:   Bilateral Distance:    Right Eye Near:   Left Eye Near:    Bilateral Near:     Physical Exam Vitals and nursing note reviewed.  Constitutional:      General: She is not in acute distress.    Appearance: She is well-developed. She is not ill-appearing.  HENT:     Head: Normocephalic and atraumatic.     Right Ear: Tympanic membrane and ear canal normal.     Left Ear: Tympanic membrane and ear canal normal.     Nose: Congestion present.     Mouth/Throat:     Mouth: Mucous membranes are moist.     Pharynx: Oropharynx is clear. Uvula midline. No oropharyngeal exudate or posterior oropharyngeal erythema.     Tonsils: No tonsillar exudate or tonsillar abscesses.  Eyes:     Conjunctiva/sclera: Conjunctivae normal.     Pupils: Pupils are equal, round, and reactive to light.  Cardiovascular:     Rate and Rhythm: Normal rate and regular rhythm.     Heart sounds: Normal heart sounds.  Pulmonary:     Effort: Pulmonary effort is normal.     Breath sounds: Rhonchi present.     Comments: Mild rhonchi right lower base Musculoskeletal:     Cervical back: Normal range of motion and neck supple.  Lymphadenopathy:     Cervical: No cervical adenopathy.  Skin:    General: Skin is warm and dry.  Neurological:     General: No focal deficit present.     Mental Status: She is alert and oriented to person, place, and time.  Psychiatric:        Mood and Affect: Mood normal.        Behavior: Behavior normal.      UC Treatments / Results  Labs (all labs ordered are listed, but only abnormal results are displayed) Labs Reviewed - No data to display  EKG   Radiology No results found.  Procedures Procedures (including critical care time)  Medications Ordered in UC Medications  - No data to display  Initial Impression / Assessment and Plan / UC Course  I have reviewed the triage vital signs and the nursing notes.  Pertinent labs & imaging results that were available during my care of the patient were reviewed by me and considered in my medical decision making (see chart for details).     Reviewed exam and symptoms with patient. Start Zithromax Prednisone x 5 days Continue inhalers as needed Tessalon as needed cough Rest and fluids PCP follow-up 2 to 3 days for recheck ER for any worsening symptoms Final Clinical Impressions(s) / UC Diagnoses   Final diagnoses:  Acute bronchitis, unspecified organism  Mild intermittent asthma, unspecified whether complicated     Discharge Instructions      Zithromax as prescribed Prednisone daily for 5 days Tessalon as needed for cough Rest and fluids Please follow-up with your PCP in 2 to 3 days for recheck Please go to the emergency room if you have any worsening symptoms    ED Prescriptions     Medication Sig Dispense Auth. Provider   azithromycin (ZITHROMAX) 250 MG tablet Take 1 tablet (250 mg total) by mouth daily. Take first 2 tablets together, then 1 every day until finished. 6 tablet Melynda Ripple, NP   predniSONE (DELTASONE) 20 MG tablet Take 1 tablet (20 mg total) by mouth daily  with breakfast for 5 days. 5 tablet Melynda Ripple, NP   benzonatate (TESSALON) 100 MG capsule Take 1 capsule (100 mg total) by mouth every 8 (eight) hours. 21 capsule Melynda Ripple, NP      PDMP not reviewed this encounter.   Melynda Ripple, NP 02/24/22 [phone]

## 2022-03-09 ENCOUNTER — Other Ambulatory Visit: Payer: Self-pay | Admitting: Internal Medicine

## 2022-03-17 ENCOUNTER — Other Ambulatory Visit: Payer: Self-pay

## 2022-03-17 ENCOUNTER — Other Ambulatory Visit: Payer: Self-pay | Admitting: Internal Medicine

## 2022-03-28 NOTE — Progress Notes (Unsigned)
    Subjective:    Patient ID: Jessica Farrell, female    DOB: October 26, 1945, 77 y.o.   MRN: [id number]      HPI Belita is here for No chief complaint on file.   She is here for an acute visit for cold symptoms.   Her symptoms started   She is experiencing   She has tried taking       Medications and allergies reviewed with patient and updated if appropriate.  Current Outpatient Medications on File Prior to Visit  Medication Sig Dispense Refill   albuterol (VENTOLIN HFA) 108 (90 Base) MCG/ACT inhaler INHALE 2 PUFFS EVERY 6 HOURS AS NEEDED FOR WHEEZING OR SHORTNESS OF BREATH 54 g 3   atorvastatin (LIPITOR) 10 MG tablet TAKE 1 TABLET EVERY DAY 90 tablet 1   azithromycin (ZITHROMAX) 250 MG tablet Take 1 tablet (250 mg total) by mouth daily. Take first 2 tablets together, then 1 every day until finished. 6 tablet 0   benzonatate (TESSALON) 100 MG capsule Take 1 capsule (100 mg total) by mouth every 8 (eight) hours. 21 capsule 0   cetirizine (ZYRTEC) 10 MG tablet Take 1 tablet (10 mg total) by mouth daily. 15 tablet 0   erythromycin ophthalmic ointment 3 (three) times daily.     fluticasone-salmeterol (ADVAIR DISKUS) 500-50 MCG/ACT AEPB Inhale 1 puff into the lungs in the morning and at bedtime. 180 each 3   gabapentin (NEURONTIN) 100 MG capsule Take 1 capsule (100 mg total) by mouth at bedtime. 90 capsule 1   glimepiride (AMARYL) 2 MG tablet TAKE 1 TABLET BY MOUTH ONCE DAILY BEFORE BREAKFAST 30 tablet 0   glucose blood test strip Based on insurance preference. Use as instructed once daily to check sugars. Dx E11.9 100 each 12   Lancets (ONETOUCH ULTRASOFT) lancets Use as instructed once daily to check sugars. Dx E11.9 100 each 12   levothyroxine (SYNTHROID) 150 MCG tablet Take 1 tablet (150 mcg total) by mouth daily. 90 tablet 3   loperamide (IMODIUM) 1 MG/5ML solution Take by mouth as needed for diarrhea or loose stools.     metFORMIN (GLUCOPHAGE-XR) 750 MG 24 hr tablet TAKE 2  TABLETS EVERY DAY WITH SUPPER 180 tablet 10   montelukast (SINGULAIR) 10 MG tablet TAKE 1 TABLET AT BEDTIME 90 tablet 1   naproxen (NAPROSYN) 375 MG tablet Take 1 tablet (375 mg total) by mouth 2 (two) times daily. 20 tablet 0   Prenatal Vit-Fe Fumarate-FA (PRENATAL VITAMINS PO) Take by mouth.     RYBELSUS 7 MG TABS Take 1 tablet by mouth once daily 30 tablet 0   No current facility-administered medications on file prior to visit.    Review of Systems     Objective:  There were no vitals filed for this visit. BP Readings from Last 3 Encounters:  02/24/22 130/84  11/18/21 118/70  11/04/21 139/86   Wt Readings from Last 3 Encounters:  11/18/21 188 lb (85.3 kg)  11/04/21 190 lb 9.6 oz (86.5 kg)  08/04/21 183 lb 6.4 oz (83.2 kg)   There is no height or weight on file to calculate BMI.    Physical Exam         Assessment & Plan:    See Problem List for Assessment and Plan of chronic medical problems.

## 2022-03-29 ENCOUNTER — Encounter: Payer: Self-pay | Admitting: Internal Medicine

## 2022-03-29 ENCOUNTER — Ambulatory Visit (INDEPENDENT_AMBULATORY_CARE_PROVIDER_SITE_OTHER): Payer: Medicare HMO | Admitting: Internal Medicine

## 2022-03-29 ENCOUNTER — Ambulatory Visit (INDEPENDENT_AMBULATORY_CARE_PROVIDER_SITE_OTHER): Payer: Medicare HMO

## 2022-03-29 VITALS — BP 128/72 | HR 91 | Temp 98.5°F | Ht 65.0 in | Wt 171.0 lb

## 2022-03-29 DIAGNOSIS — J189 Pneumonia, unspecified organism: Secondary | ICD-10-CM | POA: Insufficient documentation

## 2022-03-29 DIAGNOSIS — R059 Cough, unspecified: Secondary | ICD-10-CM | POA: Diagnosis not present

## 2022-03-29 DIAGNOSIS — J45901 Unspecified asthma with (acute) exacerbation: Secondary | ICD-10-CM | POA: Insufficient documentation

## 2022-03-29 DIAGNOSIS — R0602 Shortness of breath: Secondary | ICD-10-CM | POA: Diagnosis not present

## 2022-03-29 MED ORDER — CEFDINIR 300 MG PO CAPS: 300.0000 mg | ORAL_CAPSULE | Freq: Two times a day (BID) | ORAL | 0 refills | Status: DC

## 2022-03-29 MED ORDER — DEXAMETHASONE 4 MG PO TABS: 4.0000 mg | ORAL_TABLET | Freq: Every day | ORAL | 0 refills | Status: DC

## 2022-03-29 NOTE — Patient Instructions (Addendum)
      Have a chest xray downstairs.     Medications changes include :   cefdinir twice daily x 10 day, decadron '4mg'$  once a day for 5 days      Return if symptoms worsen or fail to improve.

## 2022-03-29 NOTE — Assessment & Plan Note (Signed)
Acute Mild asthma exacerbation-crackles and mild expiratory wheezing on exam Related to URI-concern for community-acquired pneumonia Dexamethasone 4 mg daily x 5 days Omnicef 300 mg twice daily x 10 days Continue inhalers Call if no improvement

## 2022-03-29 NOTE — Assessment & Plan Note (Addendum)
Acute Symptoms started a month ago-no improvement after Z-Pak, low-dose prednisone Mild expiratory wheeze and crackles at the bases Clinically she has community-acquired pneumonia with mild asthma exacerbation Chest x-ray today shows no evidence of pneumonia, but clinically there is still concern Start Omnicef 300 mg twice daily x 10 days Dexamethasone 4 mg daily x 5 days Continue inhalers Call if no improvement

## 2022-04-06 ENCOUNTER — Other Ambulatory Visit: Payer: Self-pay

## 2022-04-06 ENCOUNTER — Other Ambulatory Visit: Payer: Self-pay | Admitting: Internal Medicine

## 2022-04-14 ENCOUNTER — Other Ambulatory Visit: Payer: Self-pay

## 2022-04-14 ENCOUNTER — Other Ambulatory Visit: Payer: Self-pay | Admitting: Internal Medicine

## 2022-04-25 ENCOUNTER — Telehealth: Payer: Self-pay | Admitting: Internal Medicine

## 2022-04-25 NOTE — Telephone Encounter (Signed)
Reached out to patient to reschedule appointments, providers is going to be out of office. Left voicemail for patient to call back to confirm or reschedule.

## 2022-05-03 ENCOUNTER — Other Ambulatory Visit: Payer: Self-pay | Admitting: Internal Medicine

## 2022-05-03 DIAGNOSIS — E1165 Type 2 diabetes mellitus with hyperglycemia: Secondary | ICD-10-CM

## 2022-05-12 ENCOUNTER — Other Ambulatory Visit: Payer: Self-pay | Admitting: Internal Medicine

## 2022-05-18 ENCOUNTER — Ambulatory Visit: Payer: Medicare HMO | Admitting: Hematology

## 2022-05-18 ENCOUNTER — Other Ambulatory Visit: Payer: Medicare HMO

## 2022-05-21 ENCOUNTER — Encounter: Payer: Self-pay | Admitting: Internal Medicine

## 2022-05-21 NOTE — Progress Notes (Unsigned)
Subjective:    Patient ID: Jessica Farrell, female    DOB: 07-30-1945, 77 y.o.   MRN: [id number]      HPI Jessica Farrell is here for a Physical exam and her chronic medical problems.   She is coughing more.  She coughs up a lot in the morning.  Uses the advair once a day.  She uses albuterol as needed, but not regularly.  Mucus that she coughs up is usually white in color. She has occasional wheezing, no shortness of breath or fevers.  She does not feel as many any improvement since she was here last.   Medications and allergies reviewed with patient and updated if appropriate.  Current Outpatient Medications on File Prior to Visit  Medication Sig Dispense Refill   albuterol (VENTOLIN HFA) 108 (90 Base) MCG/ACT inhaler INHALE 2 PUFFS EVERY 6 HOURS AS NEEDED FOR WHEEZING OR SHORTNESS OF BREATH 54 g 3   atorvastatin (LIPITOR) 10 MG tablet TAKE 1 TABLET EVERY DAY 90 tablet 3   benzonatate (TESSALON) 100 MG capsule Take 1 capsule (100 mg total) by mouth every 8 (eight) hours. 21 capsule 0   cetirizine (ZYRTEC) 10 MG tablet Take 1 tablet (10 mg total) by mouth daily. 15 tablet 0   fluticasone-salmeterol (ADVAIR DISKUS) 500-50 MCG/ACT AEPB Inhale 1 puff into the lungs in the morning and at bedtime. 180 each 3   gabapentin (NEURONTIN) 100 MG capsule Take 1 capsule (100 mg total) by mouth at bedtime. 90 capsule 1   glimepiride (AMARYL) 2 MG tablet TAKE 1 TABLET BY MOUTH ONCE DAILY BEFORE BREAKFAST 30 tablet 0   glucose blood test strip Based on insurance preference. Use as instructed once daily to check sugars. Dx E11.9 100 each 12   Lancets (ONETOUCH ULTRASOFT) lancets Use as instructed once daily to check sugars. Dx E11.9 100 each 12   levothyroxine (SYNTHROID) 150 MCG tablet TAKE 1 TABLET EVERY DAY 90 tablet 3   loperamide (IMODIUM) 1 MG/5ML solution Take by mouth as needed for diarrhea or loose stools.     metFORMIN (GLUCOPHAGE-XR) 750 MG 24 hr tablet TAKE 2 TABLETS EVERY DAY WITH SUPPER 180  tablet 10   montelukast (SINGULAIR) 10 MG tablet TAKE 1 TABLET AT BEDTIME 90 tablet 3   naproxen (NAPROSYN) 375 MG tablet Take 1 tablet (375 mg total) by mouth 2 (two) times daily. 20 tablet 0   Prenatal Vit-Fe Fumarate-FA (PRENATAL VITAMINS PO) Take by mouth.     RYBELSUS 7 MG TABS Take 1 tablet by mouth once daily 30 tablet 0   No current facility-administered medications on file prior to visit.    Review of Systems  Constitutional:  Negative for appetite change, chills and fever.  Eyes:  Positive for visual disturbance.  Respiratory:  Positive for cough (productive) and wheezing (occ). Negative for shortness of breath.   Cardiovascular:  Positive for leg swelling (mild). Negative for chest pain and palpitations.  Gastrointestinal:  Negative for abdominal pain, blood in stool and constipation. Diarrhea: at times with rybelsus.      No gerd  Genitourinary:  Negative for dysuria.  Musculoskeletal:  Positive for back pain (occ with prolonged standing). Negative for arthralgias.  Skin:  Negative for rash.  Neurological:  Positive for dizziness. Negative for light-headedness and headaches.  Psychiatric/Behavioral:  Negative for dysphoric mood. The patient is not nervous/anxious.        Objective:   Vitals:   05/22/22 0811  BP: 124/60  Pulse: 79  Temp: 98.3  F (36.8 C)  SpO2: 96%   Filed Weights   05/22/22 0811  Weight: 164 lb (74.4 kg)   Body mass index is 27.29 kg/m.  BP Readings from Last 3 Encounters:  05/22/22 124/60  03/29/22 128/72  02/24/22 130/84    Wt Readings from Last 3 Encounters:  05/22/22 164 lb (74.4 kg)  03/29/22 171 lb (77.6 kg)  11/18/21 188 lb (85.3 kg)       Physical Exam Constitutional: She appears well-developed and well-nourished. No distress.  HENT:  Head: Normocephalic and atraumatic.  Right Ear: External ear normal. Normal ear canal and TM Left Ear: External ear normal.  Normal ear canal and TM Mouth/Throat: Oropharynx is clear and  moist.  Eyes: Conjunctivae normal.  Neck: Neck supple. No tracheal deviation present. No thyromegaly present.  No carotid bruit  Cardiovascular: Normal rate, regular rhythm and normal heart sounds.   No murmur heard.  No edema. Pulmonary/Chest: Effort normal and breath sounds normal. No respiratory distress. She has no wheezes. She has no rales.  Breast: deferred   Abdominal: Soft. She exhibits no distension. There is no tenderness.  Lymphadenopathy: She has no cervical adenopathy.  Skin: Skin is warm and dry. She is not diaphoretic.  Psychiatric: She has a normal mood and affect. Her behavior is normal.     Lab Results  Component Value Date   WBC 7.1 01/31/2022   HGB 12.5 01/31/2022   HCT 38.7 01/31/2022   PLT 219 01/31/2022   GLUCOSE 131 (H) 01/31/2022   CHOL 120 04/22/2020   TRIG 211.0 (H) 04/22/2020   HDL 29.40 (L) 04/22/2020   LDLDIRECT 64.0 04/22/2020   LDLCALC 55 07/20/2016   ALT 16 01/31/2022   AST 16 01/31/2022   NA 141 01/31/2022   K 4.0 01/31/2022   CL 108 01/31/2022   CREATININE 0.85 01/31/2022   BUN 14 01/31/2022   CO2 27 01/31/2022   TSH 0.013 (L) 03/17/2021   INR 0.9 10/28/2019   HGBA1C 6.9 (H) 10/26/2020   MICROALBUR 1.3 04/22/2020         Assessment & Plan:   Physical exam: Screening blood work  ordered Exercise  active at work - works 5 days a week [employer]  ok for age Substance abuse  none   Reviewed recommended immunizations.   Health Maintenance  Topic Date Due   COVID-19 Vaccine (1) Never done   FOOT EXAM  Never done   OPHTHALMOLOGY EXAM  Never done   Zoster Vaccines- Shingrix (1 of 2) Never done   DEXA SCAN  03/25/2020   Diabetic kidney evaluation - Urine ACR  04/22/2021   HEMOGLOBIN A1C  04/25/2021   Medicare Annual Wellness (AWV)  08/02/2022   INFLUENZA VACCINE  09/21/2022   Diabetic kidney evaluation - eGFR measurement  02/01/2023   DTaP/Tdap/Td (2 - Td or Tdap) 02/24/2029   Pneumonia Vaccine 67+ Years old  Completed    Hepatitis C Screening  Completed   HPV VACCINES  Aged Out   COLONOSCOPY (Pts 45-30yrs Insurance coverage will need to be confirmed)  Discontinued          See Problem List for Assessment and Plan of chronic medical problems.

## 2022-05-21 NOTE — Patient Instructions (Addendum)
Blood work was ordered.   The lab is on the first floor.    Medications changes include :   none     Return in about 6 months (around 11/21/2022) for follow up.     Health Maintenance, Female Adopting a healthy lifestyle and getting preventive care are important in promoting health and wellness. Ask your health care provider about: The right schedule for you to have regular tests and exams. Things you can do on your own to prevent diseases and keep yourself healthy. What should I know about diet, weight, and exercise? Eat a healthy diet  Eat a diet that includes plenty of vegetables, fruits, low-fat dairy products, and lean protein. Do not eat a lot of foods that are high in solid fats, added sugars, or sodium. Maintain a healthy weight Body mass index (BMI) is used to identify weight problems. It estimates body fat based on height and weight. Your health care provider can help determine your BMI and help you achieve or maintain a healthy weight. Get regular exercise Get regular exercise. This is one of the most important things you can do for your health. Most adults should: Exercise for at least 150 minutes each week. The exercise should increase your heart rate and make you sweat (moderate-intensity exercise). Do strengthening exercises at least twice a week. This is in addition to the moderate-intensity exercise. Spend less time sitting. Even light physical activity can be beneficial. Watch cholesterol and blood lipids Have your blood tested for lipids and cholesterol at 77 years of age, then have this test every 5 years. Have your cholesterol levels checked more often if: Your lipid or cholesterol levels are high. You are older than 77 years of age. You are at high risk for heart disease. What should I know about cancer screening? Depending on your health history and family history, you may need to have cancer screening at various ages. This may include screening  for: Breast cancer. Cervical cancer. Colorectal cancer. Skin cancer. Lung cancer. What should I know about heart disease, diabetes, and high blood pressure? Blood pressure and heart disease High blood pressure causes heart disease and increases the risk of stroke. This is more likely to develop in people who have high blood pressure readings or are overweight. Have your blood pressure checked: Every 3-5 years if you are 59-12 years of age. Every year if you are 81 years old or older. Diabetes Have regular diabetes screenings. This checks your fasting blood sugar level. Have the screening done: Once every three years after age 71 if you are at a normal weight and have a low risk for diabetes. More often and at a younger age if you are overweight or have a high risk for diabetes. What should I know about preventing infection? Hepatitis B If you have a higher risk for hepatitis B, you should be screened for this virus. Talk with your health care provider to find out if you are at risk for hepatitis B infection. Hepatitis C Testing is recommended for: Everyone born from [birth year] through 1965. Anyone with known risk factors for hepatitis C. Sexually transmitted infections (STIs) Get screened for STIs, including gonorrhea and chlamydia, if: You are sexually active and are younger than 77 years of age. You are older than 77 years of age and your health care provider tells you that you are at risk for this type of infection. Your sexual activity has changed since you were last screened, and you  are at increased risk for chlamydia or gonorrhea. Ask your health care provider if you are at risk. Ask your health care provider about whether you are at high risk for HIV. Your health care provider may recommend a prescription medicine to help prevent HIV infection. If you choose to take medicine to prevent HIV, you should first get tested for HIV. You should then be tested every 3 months for as long as you  are taking the medicine. Pregnancy If you are about to stop having your period (premenopausal) and you may become pregnant, seek counseling before you get pregnant. Take 400 to 800 micrograms (mcg) of folic acid every day if you become pregnant. Ask for birth control (contraception) if you want to prevent pregnancy. Osteoporosis and menopause Osteoporosis is a disease in which the bones lose minerals and strength with aging. This can result in bone fractures. If you are 74 years old or older, or if you are at risk for osteoporosis and fractures, ask your health care provider if you should: Be screened for bone loss. Take a calcium or vitamin D supplement to lower your risk of fractures. Be given hormone replacement therapy (HRT) to treat symptoms of menopause. Follow these instructions at home: Alcohol use Do not drink alcohol if: Your health care provider tells you not to drink. You are pregnant, may be pregnant, or are planning to become pregnant. If you drink alcohol: Limit how much you have to: 0-1 drink a day. Know how much alcohol is in your drink. In the [place] one drink equals one 12 oz bottle of beer (355 mL), one 5 oz glass of wine (148 mL), or one 1 oz glass of hard liquor (44 mL). Lifestyle Do not use any products that contain nicotine or tobacco. These products include cigarettes, chewing tobacco, and vaping devices, such as e-cigarettes. If you need help quitting, ask your health care provider. Do not use street drugs. Do not share needles. Ask your health care provider for help if you need support or information about quitting drugs. General instructions Schedule regular health, dental, and eye exams. Stay current with your vaccines. Tell your health care provider if: You often feel depressed. You have ever been abused or do not feel safe at home. Summary Adopting a healthy lifestyle and getting preventive care are important in promoting health and wellness. Follow your  health care provider's instructions about healthy diet, exercising, and getting tested or screened for diseases. Follow your health care provider's instructions on monitoring your cholesterol and blood pressure. This information is not intended to replace advice given to you by your health care provider. Make sure you discuss any questions you have with your health care provider. Document Revised: 06/28/2020 Document Reviewed: 06/28/2020 Elsevier Patient Education  2023 [employer].

## 2022-05-22 ENCOUNTER — Ambulatory Visit (INDEPENDENT_AMBULATORY_CARE_PROVIDER_SITE_OTHER): Payer: Medicare HMO | Admitting: Internal Medicine

## 2022-05-22 VITALS — BP 124/60 | HR 79 | Temp 98.3°F | Ht 65.0 in | Wt 164.0 lb

## 2022-05-22 DIAGNOSIS — M8589 Other specified disorders of bone density and structure, multiple sites: Secondary | ICD-10-CM | POA: Diagnosis not present

## 2022-05-22 DIAGNOSIS — I700 Atherosclerosis of aorta: Secondary | ICD-10-CM

## 2022-05-22 DIAGNOSIS — J4520 Mild intermittent asthma, uncomplicated: Secondary | ICD-10-CM

## 2022-05-22 DIAGNOSIS — E7849 Other hyperlipidemia: Secondary | ICD-10-CM | POA: Diagnosis not present

## 2022-05-22 DIAGNOSIS — E1165 Type 2 diabetes mellitus with hyperglycemia: Secondary | ICD-10-CM | POA: Diagnosis not present

## 2022-05-22 DIAGNOSIS — K429 Umbilical hernia without obstruction or gangrene: Secondary | ICD-10-CM

## 2022-05-22 DIAGNOSIS — Z0000 Encounter for general adult medical examination without abnormal findings: Secondary | ICD-10-CM | POA: Diagnosis not present

## 2022-05-22 DIAGNOSIS — E038 Other specified hypothyroidism: Secondary | ICD-10-CM | POA: Diagnosis not present

## 2022-05-22 DIAGNOSIS — R052 Subacute cough: Secondary | ICD-10-CM | POA: Diagnosis not present

## 2022-05-22 LAB — CBC WITH DIFFERENTIAL/PLATELET
Basophils Absolute: 0.1 10*3/uL (ref 0.0–0.1)
Basophils Relative: 0.7 % (ref 0.0–3.0)
Eosinophils Absolute: 0.2 10*3/uL (ref 0.0–0.7)
Eosinophils Relative: 3 % (ref 0.0–5.0)
HCT: 37.9 % (ref 36.0–46.0)
Hemoglobin: 12.9 g/dL (ref 12.0–15.0)
Lymphocytes Relative: 24.2 % (ref 12.0–46.0)
Lymphs Abs: 1.8 10*3/uL (ref 0.7–4.0)
MCHC: 33.9 g/dL (ref 30.0–36.0)
MCV: 85.9 fl (ref 78.0–100.0)
Monocytes Absolute: 0.6 10*3/uL (ref 0.1–1.0)
Monocytes Relative: 7.3 % (ref 3.0–12.0)
Neutro Abs: 4.9 10*3/uL (ref 1.4–7.7)
Neutrophils Relative %: 64.8 % (ref 43.0–77.0)
Platelets: 220 10*3/uL (ref 150.0–400.0)
RBC: 4.42 Mil/uL (ref 3.87–5.11)
RDW: 13.3 % (ref 11.5–15.5)
WBC: 7.6 10*3/uL (ref 4.0–10.5)

## 2022-05-22 LAB — TSH: TSH: 0.04 u[IU]/mL — ABNORMAL LOW (ref 0.35–5.50)

## 2022-05-22 LAB — LIPID PANEL
Cholesterol: 136 mg/dL (ref 0–200)
HDL: 37 mg/dL — ABNORMAL LOW (ref >39.00)
LDL Cholesterol: 68 mg/dL (ref 0–99)
NonHDL: 98.59
Total CHOL/HDL Ratio: 4
Triglycerides: 155 mg/dL — ABNORMAL HIGH (ref 0.0–149.0)
VLDL: 31 mg/dL (ref 0.0–40.0)

## 2022-05-22 LAB — COMPREHENSIVE METABOLIC PANEL
ALT: 15 U/L (ref 0–35)
AST: 17 U/L (ref 0–37)
Albumin: 3.8 g/dL (ref 3.5–5.2)
Alkaline Phosphatase: 117 U/L (ref 39–117)
BUN: 16 mg/dL (ref 6–23)
CO2: 29 mEq/L (ref 19–32)
Calcium: 9.4 mg/dL (ref 8.4–10.5)
Chloride: 106 mEq/L (ref 96–112)
Creatinine, Ser: 0.87 mg/dL (ref 0.40–1.20)
GFR: 64.44 mL/min (ref >60.00)
Glucose, Bld: 130 mg/dL — ABNORMAL HIGH (ref 70–99)
Potassium: 4.3 mEq/L (ref 3.5–5.1)
Sodium: 138 mEq/L (ref 135–145)
Total Bilirubin: 0.3 mg/dL (ref 0.2–1.2)
Total Protein: 6.8 g/dL (ref 6.0–8.3)

## 2022-05-22 LAB — MICROALBUMIN / CREATININE URINE RATIO
Creatinine,U: 149.4 mg/dL
Microalb Creat Ratio: 0.7 mg/g (ref 0.0–30.0)
Microalb, Ur: 1.1 mg/dL (ref 0.0–1.9)

## 2022-05-22 LAB — HEMOGLOBIN A1C: Hgb A1c MFr Bld: 7.2 % — ABNORMAL HIGH (ref 4.6–6.5)

## 2022-05-22 MED ORDER — RYBELSUS 7 MG PO TABS: 1.0000 | ORAL_TABLET | Freq: Every day | ORAL | 1 refills | Status: DC

## 2022-05-22 MED ORDER — FLUTICASONE-SALMETEROL 500-50 MCG/ACT IN AEPB: 1.0000 | INHALATION_SPRAY | Freq: Two times a day (BID) | RESPIRATORY_TRACT | 3 refills | Status: DC

## 2022-05-22 NOTE — Assessment & Plan Note (Addendum)
Chronic   Lab Results  Component Value Date   HGBA1C 6.9 (H) 10/26/2020  Chronic Lab Results  Component Value Date   HGBA1C 6.9 (H) 10/26/2020    Stopped metformin - felt better w/o it - no true side effects Check A1c, urine microalbumin today Continue glimepiride 2 mg daily, Rybelsus 7 mg daily Stressed regular exercise, diabetic diet

## 2022-05-22 NOTE — Assessment & Plan Note (Signed)
Chronic She does have a residual cough from her community-acquired pneumonia she had earlier this year-cough is productive of white sputum, occasional wheeze Taking Advair once a day most days-stressed trying to increase that to twice a day Use albuterol twice a day to see if that helps clear some of the mucus She will let me know if there is no improvement

## 2022-05-22 NOTE — Assessment & Plan Note (Signed)
Chronic Regular exercise and healthy diet encouraged Check lipid panel, CMP Continue atorvastatin 10 mg daily 

## 2022-05-22 NOTE — Assessment & Plan Note (Signed)
Chronic  Clinically euthyroid Check tsh and will titrate med dose if needed Currently taking levothyroxine 150 mcg daily 

## 2022-05-22 NOTE — Assessment & Plan Note (Signed)
Chronic.  Stable. Continue to monitor. 

## 2022-05-22 NOTE — Assessment & Plan Note (Signed)
Chronic Deferred DEXA She is active at work Taking a multivitamin

## 2022-05-22 NOTE — Assessment & Plan Note (Signed)
Chronic Check lipid panel Continue atorvastatin 10 mg daily Encourage healthy diet and regular exercise

## 2022-05-22 NOTE — Assessment & Plan Note (Signed)
Subacute Productive cough with white sputum, occasional wheeze Cough has not improved since her illness with pneumonia Using Advair once a day for the most part and has not been taking the albuterol Possible mild bronchiectasis Advised using the Advair twice a day and using the albuterol couple of times during the day to see if that helps Was on steroids when she was acutely ill-does not tolerate them well and does not want to take them-lungs are clear so we will hold off on any steroids

## 2022-05-23 MED ORDER — LEVOTHYROXINE SODIUM 150 MCG PO TABS: 150.0000 ug | ORAL_TABLET | Freq: Every day | ORAL | 3 refills | Status: DC

## 2022-05-23 NOTE — Addendum Note (Signed)
Addended by: Binnie Rail on: 05/23/2022 05:58 AM   Modules accepted: Orders

## 2022-05-26 ENCOUNTER — Encounter: Payer: Self-pay | Admitting: Hematology

## 2022-05-26 ENCOUNTER — Inpatient Hospital Stay (HOSPITAL_BASED_OUTPATIENT_CLINIC_OR_DEPARTMENT_OTHER): Payer: Medicare HMO | Admitting: Hematology

## 2022-05-26 ENCOUNTER — Other Ambulatory Visit: Payer: Self-pay

## 2022-05-26 ENCOUNTER — Inpatient Hospital Stay: Payer: Medicare HMO | Attending: Hematology

## 2022-05-26 VITALS — BP 118/72 | HR 66 | Temp 98.0°F | Resp 17 | Ht 65.0 in | Wt 164.4 lb

## 2022-05-26 DIAGNOSIS — C7A8 Other malignant neuroendocrine tumors: Secondary | ICD-10-CM | POA: Insufficient documentation

## 2022-05-26 DIAGNOSIS — C7B8 Other secondary neuroendocrine tumors: Secondary | ICD-10-CM | POA: Diagnosis not present

## 2022-05-26 DIAGNOSIS — E538 Deficiency of other specified B group vitamins: Secondary | ICD-10-CM

## 2022-05-26 LAB — CMP (CANCER CENTER ONLY)
ALT: 15 U/L (ref 0–44)
AST: 17 U/L (ref 15–41)
Albumin: 3.8 g/dL (ref 3.5–5.0)
Alkaline Phosphatase: 119 U/L (ref 38–126)
Anion gap: 7 (ref 5–15)
BUN: 11 mg/dL (ref 8–23)
CO2: 29 mmol/L (ref 22–32)
Calcium: 9.5 mg/dL (ref 8.9–10.3)
Chloride: 107 mmol/L (ref 98–111)
Creatinine: 0.86 mg/dL (ref 0.44–1.00)
GFR, Estimated: >60 mL/min (ref >60)
Glucose, Bld: 110 mg/dL — ABNORMAL HIGH (ref 70–99)
Potassium: 4 mmol/L (ref 3.5–5.1)
Sodium: 143 mmol/L (ref 135–145)
Total Bilirubin: 0.4 mg/dL (ref 0.3–1.2)
Total Protein: 6.9 g/dL (ref 6.5–8.1)

## 2022-05-26 LAB — CBC WITH DIFFERENTIAL (CANCER CENTER ONLY)
Abs Immature Granulocytes: 0.02 10*3/uL (ref 0.00–0.07)
Basophils Absolute: 0.1 10*3/uL (ref 0.0–0.1)
Basophils Relative: 1 %
Eosinophils Absolute: 0.2 10*3/uL (ref 0.0–0.5)
Eosinophils Relative: 3 %
HCT: 36.9 % (ref 36.0–46.0)
Hemoglobin: 12.4 g/dL (ref 12.0–15.0)
Immature Granulocytes: 0 %
Lymphocytes Relative: 28 %
Lymphs Abs: 2 10*3/uL (ref 0.7–4.0)
MCH: 29.2 pg (ref 26.0–34.0)
MCHC: 33.6 g/dL (ref 30.0–36.0)
MCV: 87 fL (ref 80.0–100.0)
Monocytes Absolute: 0.5 10*3/uL (ref 0.1–1.0)
Monocytes Relative: 7 %
Neutro Abs: 4.3 10*3/uL (ref 1.7–7.7)
Neutrophils Relative %: 61 %
Platelet Count: 217 10*3/uL (ref 150–400)
RBC: 4.24 MIL/uL (ref 3.87–5.11)
RDW: 12.5 % (ref 11.5–15.5)
WBC Count: 7 10*3/uL (ref 4.0–10.5)
nRBC: 0 % (ref 0.0–0.2)

## 2022-05-26 LAB — VITAMIN B12: Vitamin B-12: 911 pg/mL (ref 180–914)

## 2022-05-26 NOTE — Assessment & Plan Note (Signed)
pT3 N1 M0, stage IIIB, peritoneal metastasis 09/2019 -diagnosed 01/2014, s/p resection 02/03/14 showing 3 cm NET involving serosal tissue. Margins negative, but 5/25 positive nodes with extracapsular extension. -10/06/19 Dotatate PET showed metastatic disease to peritoneum, abdominal and mediastinal lymph nodes, and lungs. Indeterminate liver lesions. 10/29/19 peritoneal biopsy confirmed well-differentiated neuroendocrine tumor. -she took Sandostatin injections monthly 11/06/19 - 01/05/20, discontinued due to hyperglycemia, fatigue, and an episode of syncope and fall right after the injection. -for symptom management, she took one dose lanreotide on 02/24/21. This improved her diarrhea, but she developed vision issues in her right eye, and stopped -restaging CT CAP 02/08/2022 showed stable to slightly increased pulmonary nodules and peritoneal mets; otherwise no new signs of metastatic disease. Will continue to monitor given poor tolerance of prior treatment. -we discussed alternative therapy with oral Afinitor, this continue observation.  Given her overall low disease burden, mild symptoms, will plan to continue observation. -Follow-up in 3 months.

## 2022-05-26 NOTE — Progress Notes (Signed)
[facility]   Telephone:([phone] Fax:([phone]) [phone]   Clinic Follow up Note   Patient Care Team: Pincus SanesBurns, Stacy J, MD as PCP - General (Internal Medicine) Malachy MoodFeng, Adams Hinch, MD as Consulting Physician (Hematology) Avel Peaceosenbower, Todd, MD as Consulting Physician (General Surgery) Szabat, Vinnie Levelaniel C, [facility] (Inactive) as Pharmacist (Pharmacist) Elwin MochaShah, Christopher Tulip, MD as Consulting Physician (Ophthalmology)  Date of Service:  05/26/2022  CHIEF COMPLAINT: f/u of metastatic carcinoid tumor   CURRENT THERAPY: Surveillance    ASSESSMENT:  Jessica Farrell is a 77 y.o. 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 Margins negative, but 5/25 positive nodes with extracapsular extension. -10/06/19 Dotatate PET showed metastatic disease to peritoneum, abdominal and mediastinal lymph nodes, and lungs. Indeterminate liver lesions. 10/29/19 peritoneal biopsy confirmed well-differentiated neuroendocrine tumor. -she took Sandostatin injections monthly 11/06/19 - 01/05/20, discontinued due to hyperglycemia, fatigue, and an episode of syncope and fall right after the injection. -for symptom management, she took one dose lanreotide on 02/24/21. This improved her diarrhea, but she developed vision issues in her right eye, and stopped -restaging CT CAP 02/08/2022 showed stable to slightly increased pulmonary nodules and peritoneal mets; otherwise no new signs of metastatic disease. Will continue to monitor given poor tolerance of prior treatment. -we discussed alternative therapy with oral Afinitor, this continue observation.  Given her overall low disease burden, mild symptoms, will plan to continue observation. -she is clinically doing well overall, no concerns for disease progression    PLAN: -Continue observation -discuss other  treatment regiments if needed. 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 negative margines.   02/03/2014 Surgery   Laparoscopic-assisted right colectomy and resection of distal ileum   02/03/2014 Initial Diagnosis   Metastatic malignant neuroendocrine tumor of the ileocecal valve to regional lymph nodes.    05/31/2015 Imaging   CT A/P w contrast  IMPRESSION: 1. No evidence of metastatic disease. 2. Question mild basilar subpleural pulmonary fibrosis.   09/11/2019 Imaging   CT AP W contrast    IMPRESSION: 1. Redemonstrated postoperative findings of ileocolectomy and reanastomosis. 2. There are prominent lymph nodes in the right mesocolon which are slightly increased in size compared to prior examination, the largest node measuring 1.8 x 0.7 cm, previously 1.5 x 0.4 cm. Findings are nonspecific although concerning for nodal metastatic disease. 3. There are additional new small nodules of the transverse mesocolon or omentum measuring up to 9 mm, likewise concerning for nodal or omental metastatic disease. 4. Redemonstrated broad-based midline, fat containing ventral hernia components. 5. Fluid in the endometrial cavity, abnormal in the late postmenopausal setting although unchanged compared to prior examination. 6. Status post interval cholecystectomy. 7. Aortic Atherosclerosis (ICD10-I70.0).    10/06/2019 PET scan   IMPRESSION: 1. Several small nodules in the peritoneal space  and retroperitoneal space with intense radiotracer activity consistent with metastatic well differentiated neuroendocrine tumor. 2. Several small liver lesions above background activity are indeterminate. Consider MRI liver with and without contrast. 3. Radiotracer activity within mediastinal lymph nodes and bilateral pulmonary nodules consistent with metastatic well-differentiated neuroendocrine tumor to the lungs. Activity is very low within these lesions but measurable. 4. No clear evidence skeletal metastasis. Single lesion in the posterior LEFT SI joint warrants attention on follow-up. 5. Although there are multiple sites of metastatic disease, the overall tumor burden of metastatic disease very small.   10/28/2019 Relapse/Recurrence   FINAL MICROSCOPIC DIAGNOSIS:   A. PERITONEAL NODULES, BIOPSY:  -  Well-differentiated neuroendocrine tumor (carcinoid)  -  See comment   COMMENT:   By immunohistochemistry, the neoplastic cells are positive for CD56,  chromogranin and synaptophysin with a low proliferative rate by Ki-67  (less than 1%).  These findings are consistent with metastasis of the  patient's previously diagnosed small bowel carcinoid.  These findings  were discussed with Dr. Blake Divine on October 30, 2019.    11/06/2019 -  Chemotherapy   First-line Monthly Sandostatin injection starting 11/06/19. Held since 01/05/20 due to poor toleration ( hypoglycemia, fatigue, and the episode of syncope and fall right after the injection. The diarrhea and flushing has, but overall symptoms are mild)    05/03/2020 Imaging   CT CAP  IMPRESSION: 1. Stable exam. No new or progressive interval findings. 2. Scattered tiny lung nodules are stable since PET-CT of 10/06/2019. These were hypermetabolic on that exam consistent with metastatic disease. 3. Multiple small omental and mesenteric soft tissue nodules  are similar to prior. These were also noted to be hypermetabolic on the PET study. 4. Status post ileocolectomy. 5. Left colonic diverticulosis without diverticulitis. 6. Aortic Atherosclerosis (ICD10-I70.0).     02/08/2021 Imaging   EXAM: CT CHEST, ABDOMEN, AND PELVIS WITH CONTRAST  IMPRESSION: 1. Three of the pulmonary nodules have intervally enlarged, suggesting mild progression of malignancy. The remaining pulmonary nodules in the omental nodules appear stable, and no definite new nodule is identified. 2. Other imaging findings of potential clinical significance: Aortic Atherosclerosis (ICD10-I70.0). Coronary atherosclerosis. Systemic atherosclerosis. Complex ventral supraumbilical hernia with multiple defects and lobulations. Lumbar spondylosis and degenerative disc disease   07/28/2021 Imaging   EXAM: CT CHEST, ABDOMEN, AND PELVIS WITH CONTRAST  IMPRESSION: 1. Slight increased size of several pulmonary nodules, as detailed above, concerning for mild progression of metastatic disease to the lungs. In addition, there are findings in the left lower lobe which could be of infectious or inflammatory etiology, although the possibility of developing lymphangitic spread of tumor in the left lower lobe is not excluded. Further clinical evaluation and attention on follow-up studies is recommended. 2. Multiple small soft tissue human plants in the peritoneal cavity appear very similar to the prior examination. No other new signs of metastatic disease noted elsewhere in the abdomen or pelvis. 3. Aortic atherosclerosis, in addition to left anterior descending coronary artery disease. Assessment for potential risk factor modification, dietary therapy or pharmacologic therapy may be warranted, if clinically indicated. 4. Colonic diverticulosis without evidence of acute diverticulitis at this time. 5. Additional incidental findings, similar to prior study, as above.      INTERVAL  HISTORY:  Jessica Farrell is here for a follow up of metastatic carcinoid tumor  She was last seen by me on 02/10/2022 She presents to the clinic alone. Pt state that she has had pneumonia twice since last visit. Pt reports that  she still has some diarrhea and she takes Imodium. Pt states her energy level is about the same.  Pt state that she has been using Advair for her cough and it has helped.     All other systems were reviewed with the patient and are negative.  MEDICAL HISTORY:  Past Medical History:  Diagnosis Date   Asthma    Cellulitis March  2014   Left foot and ankle   colon ca dx'd 01/2014   Colon polyps    2015    Diabetes mellitus 05/05/2012   Type II   Hyperlipidemia    Morbid obesity    Thyroid disease 1990's   HypoThyroidism    SURGICAL HISTORY: Past Surgical History:  Procedure Laterality Date   CHOLECYSTECTOMY N/A 05/02/2017   Procedure: LAPAROSCOPIC CHOLECYSTECTOMY WITH INTRAOPERATIVE CHOLANGIOGRAM, VENTRAL HERNIA REPAIR;  Surgeon: Griselda Miner, MD;  Location: [facility];  Service: General;  Laterality: N/A;   COLON RESECTION N/A 02/03/2014   Procedure: LAPAROSCOPIC ASSISTED BOWEL RESECTION;  Surgeon: Avel Peace, MD;  Location: [facility];  Service: General;  Laterality: N/A;   FRACTURE SURGERY  2000   Ankle   TOOTH EXTRACTION     wisdom Teeth    I have reviewed the social history and family history with the patient and they are unchanged from previous note.  ALLERGIES:  is allergic to penicillins.  MEDICATIONS:  Current Outpatient Medications  Medication Sig Dispense Refill   albuterol (VENTOLIN HFA) 108 (90 Base) MCG/ACT inhaler INHALE 2 PUFFS EVERY 6 HOURS AS NEEDED FOR WHEEZING OR SHORTNESS OF BREATH 54 g 3   atorvastatin (LIPITOR) 10 MG tablet TAKE 1 TABLET EVERY DAY 90 tablet 3   benzonatate (TESSALON) 100 MG capsule Take 1 capsule (100 mg total) by mouth every 8 (eight) hours. 21 capsule 0   cetirizine (ZYRTEC) 10 MG tablet Take 1 tablet (10 mg  total) by mouth daily. 15 tablet 0   fluticasone-salmeterol (ADVAIR DISKUS) 500-50 MCG/ACT AEPB Inhale 1 puff into the lungs in the morning and at bedtime. 180 each 3   gabapentin (NEURONTIN) 100 MG capsule Take 1 capsule (100 mg total) by mouth at bedtime. 90 capsule 1   glimepiride (AMARYL) 2 MG tablet TAKE 1 TABLET BY MOUTH ONCE DAILY BEFORE BREAKFAST 30 tablet 0   glucose blood test strip Based on insurance preference. Use as instructed once daily to check sugars. Dx E11.9 100 each 12   Lancets (ONETOUCH ULTRASOFT) lancets Use as instructed once daily to check sugars. Dx E11.9 100 each 12   levothyroxine (SYNTHROID) 150 MCG tablet Take 1 tablet (150 mcg total) by mouth daily. Take 6 days a week only. 90 tablet 3   loperamide (IMODIUM) 1 MG/5ML solution Take by mouth as needed for diarrhea or loose stools.     montelukast (SINGULAIR) 10 MG tablet TAKE 1 TABLET AT BEDTIME 90 tablet 3   naproxen (NAPROSYN) 375 MG tablet Take 1 tablet (375 mg total) by mouth 2 (two) times daily. 20 tablet 0   Prenatal Vit-Fe Fumarate-FA (PRENATAL VITAMINS PO) Take by mouth.     Semaglutide (RYBELSUS) 7 MG TABS Take 1 tablet (7 mg total) by mouth daily. 90 tablet 1   No current facility-administered medications for this visit.    PHYSICAL EXAMINATION: ECOG PERFORMANCE STATUS: 1 - Symptomatic but completely ambulatory  Vitals:   05/26/22 1351  BP: 118/72  Pulse: 66  Resp: 17  Temp: 98 F (36.7 C)  SpO2: 96%   Wt Readings from  Last 3 Encounters:  05/26/22 164 lb 6.4 oz (74.6 kg)  05/22/22 164 lb (74.4 kg)  03/29/22 171 lb (77.6 kg)    LUNGS:(-)  clear to auscultation and percussion with normal breathing effort HEART: regular rate & rhythm and no murmurs and no lower extremity edema ABDOMEN:(-) abdomen soft, (-) non-tender and normal bowel sounds LABORATORY DATA:  I have reviewed the data as listed    Latest Ref Rng & Units 05/26/2022    1:07 PM 05/22/2022    8:55 AM 01/31/2022    8:33 AM  CBC   WBC 4.0 - 10.5 K/uL 7.0  7.6  7.1   Hemoglobin 12.0 - 15.0 g/dL 45.4  09.8  11.9   Hematocrit 36.0 - 46.0 % 36.9  37.9  38.7   Platelets 150 - 400 K/uL 217  220.0  219         Latest Ref Rng & Units 05/26/2022    1:07 PM 05/22/2022    8:55 AM 01/31/2022    8:33 AM  CMP  Glucose 70 - 99 mg/dL 147  829  562   BUN 8 - 23 mg/dL 11  16  14    Creatinine 0.44 - 1.00 mg/dL 1.30  8.65  7.84   Sodium 135 - 145 mmol/L 143  138  141   Potassium 3.5 - 5.1 mmol/L 4.0  4.3  4.0   Chloride 98 - 111 mmol/L 107  106  108   CO2 22 - 32 mmol/L 29  29  27    Calcium 8.9 - 10.3 mg/dL 9.5  9.4  9.2   Total Protein 6.5 - 8.1 g/dL 6.9  6.8  6.6   Total Bilirubin 0.3 - 1.2 mg/dL 0.4  0.3  0.4   Alkaline Phos 38 - 126 U/L 119  117  117   AST 15 - 41 U/L 17  17  16    ALT 0 - 44 U/L 15  15  16        RADIOGRAPHIC STUDIES: I have personally reviewed the radiological images as listed and agreed with the findings in the report. No results found.    No orders of the defined types were placed in this encounter.  All questions were answered. The patient knows to call the clinic with any problems, questions or concerns. No barriers to learning was detected. The total time spent in the appointment was 25 minutes.     Malachy Mood, MD 05/26/2022   Carolin Coy, CMA, am acting as scribe for Malachy Mood, MD.   I have reviewed the above documentation for accuracy and completeness, and I agree with the above.

## 2022-05-29 LAB — CHROMOGRANIN A: Chromogranin A (ng/mL): 180.5 ng/mL — ABNORMAL HIGH (ref 0.0–101.8)

## 2022-06-12 DIAGNOSIS — H47011 Ischemic optic neuropathy, right eye: Secondary | ICD-10-CM | POA: Diagnosis not present

## 2022-06-28 DIAGNOSIS — H524 Presbyopia: Secondary | ICD-10-CM | POA: Diagnosis not present

## 2022-06-28 DIAGNOSIS — H52209 Unspecified astigmatism, unspecified eye: Secondary | ICD-10-CM | POA: Diagnosis not present

## 2022-06-28 DIAGNOSIS — H5203 Hypermetropia, bilateral: Secondary | ICD-10-CM | POA: Diagnosis not present

## 2022-06-28 DIAGNOSIS — H5213 Myopia, bilateral: Secondary | ICD-10-CM | POA: Diagnosis not present

## 2022-07-26 ENCOUNTER — Ambulatory Visit
Admission: EM | Admit: 2022-07-26 | Discharge: 2022-07-26 | Disposition: A | Discharge status: Home / Self Care | Payer: Medicare HMO | Attending: Internal Medicine | Admitting: Internal Medicine

## 2022-07-26 ENCOUNTER — Ambulatory Visit (INDEPENDENT_AMBULATORY_CARE_PROVIDER_SITE_OTHER): Payer: Medicare HMO

## 2022-07-26 DIAGNOSIS — R000 Tachycardia, unspecified: Secondary | ICD-10-CM | POA: Diagnosis not present

## 2022-07-26 DIAGNOSIS — R0602 Shortness of breath: Secondary | ICD-10-CM | POA: Diagnosis not present

## 2022-07-26 NOTE — ED Triage Notes (Signed)
Pt states 2 days ago she got a high heart rate warning on her phone and then she started to get SOB.  States she has had a cough for a long time and that she just isn't feeling good. Pt respirations even and unlabored sat 96% on RA.

## 2022-07-26 NOTE — Discharge Instructions (Signed)
Blood work is pending.  Will call if it is abnormal.  I have placed a referral to cardiology.  If they do not call you in the next 48 to 72 hours, please call them yourself at provided contact information.  Also follow-up with your family medicine doctor.

## 2022-07-26 NOTE — ED Provider Notes (Addendum)
[name]    CSN: [id number] Arrival date & time: 07/26/22  4098      History   Chief Complaint Chief Complaint  Patient presents with   Shortness of Breath    HPI Jessica Farrell is a 77 y.o. female.   Patient presents today with generally not feeling well, increased heart rate, shortness of breath, chest tightness.  Reports that she started being really fatigued, weakness, generally not feeling well approximately 5 days ago.  States that she got an alert about 3 days ago on her smart watch that her heart rate was in the 130s.  She states that has been intermittently high ever since and it has rarely went below 100.  She developed chest tightness and shortness of breath shortly after.  She denies chest pain.  Reports that she does have intermittent headaches but they started "a while ago". Reports blurry vision but states this is baseline for her.  Denies nausea, vomiting, dizziness.  She denies any pertinent cardiac or pulmonary history.  States that she does have a cough that is productive but states this has been present for multiple years.   Shortness of Breath   Past Medical History:  Diagnosis Date   Asthma    Cellulitis March  2014   Left foot and ankle   colon ca dx'd 01/2014   Colon polyps    2015    Diabetes mellitus (HCC) 05/05/2012   Type II   Hyperlipidemia    Morbid obesity (HCC)    Thyroid disease 1990's   HypoThyroidism    Patient Active Problem List   Diagnosis Date Noted   Community acquired pneumonia 03/29/2022   Asthma exacerbation, mild 03/29/2022   B12 deficiency anemia 02/02/2022   Aortic atherosclerosis (HCC) 04/22/2020   Tingling in extremities 05/23/2018   Osteopenia 03/27/2018   Umbilical hernia without obstruction or gangrene 11/21/2017   Onychomycosis 05/22/2017   Carcinoid tumor of cecum 01/26/2017   Cough 06/06/2016   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   Chronic venous  insufficiency 08/02/2012   HLD (hyperlipidemia) 05/15/2012   Diabetes mellitus (HCC) 05/05/2012   Hypothyroidism 05/05/2012   Asthma 05/05/2012    Past Surgical History:  Procedure Laterality Date   CHOLECYSTECTOMY N/A 05/02/2017   Procedure: LAPAROSCOPIC CHOLECYSTECTOMY WITH INTRAOPERATIVE CHOLANGIOGRAM, VENTRAL HERNIA REPAIR;  Surgeon: Griselda Miner, MD;  Location: [facility];  Service: General;  Laterality: N/A;   COLON RESECTION N/A 02/03/2014   Procedure: LAPAROSCOPIC ASSISTED BOWEL RESECTION;  Surgeon: Avel Peace, MD;  Location: [facility];  Service: General;  Laterality: N/A;   FRACTURE SURGERY  2000   Ankle   TOOTH EXTRACTION     wisdom Teeth    OB History   No obstetric history on file.      Home Medications    Prior to Admission medications   Medication Sig Start Date End Date Taking? Authorizing Provider  albuterol (VENTOLIN HFA) 108 (90 Base) MCG/ACT inhaler INHALE 2 PUFFS EVERY 6 HOURS AS NEEDED FOR WHEEZING OR SHORTNESS OF BREATH 03/09/22   Pincus Sanes, MD  atorvastatin (LIPITOR) 10 MG tablet TAKE 1 TABLET EVERY DAY 05/03/22   Burns, Bobette Mo, MD  benzonatate (TESSALON) 100 MG capsule Take 1 capsule (100 mg total) by mouth every 8 (eight) hours. 02/24/22   Radford Pax, NP  cetirizine (ZYRTEC) 10 MG tablet Take 1 tablet (10 mg total) by mouth daily. 01/18/20   Derwood Kaplan, MD  fluticasone-salmeterol (ADVAIR DISKUS) 500-50  MCG/ACT AEPB Inhale 1 puff into the lungs in the morning and at bedtime. 05/22/22   Pincus Sanes, MD  gabapentin (NEURONTIN) 100 MG capsule Take 1 capsule (100 mg total) by mouth at bedtime. 01/23/20   Pincus Sanes, MD  glimepiride (AMARYL) 2 MG tablet TAKE 1 TABLET BY MOUTH ONCE DAILY BEFORE BREAKFAST 11/08/21   Pincus Sanes, MD  glucose blood test strip Based on insurance preference. Use as instructed once daily to check sugars. Dx E11.9 06/04/18   Pincus Sanes, MD  Lancets [facility] ULTRASOFT) lancets Use as instructed once daily to check  sugars. Dx E11.9 05/23/18   Pincus Sanes, MD  levothyroxine (SYNTHROID) 150 MCG tablet Take 1 tablet (150 mcg total) by mouth daily. Take 6 days a week only. 05/23/22   Pincus Sanes, MD  loperamide (IMODIUM) 1 MG/5ML solution Take by mouth as needed for diarrhea or loose stools.    [provider]  montelukast (SINGULAIR) 10 MG tablet TAKE 1 TABLET AT BEDTIME 05/03/22   Pincus Sanes, MD  naproxen (NAPROSYN) 375 MG tablet Take 1 tablet (375 mg total) by mouth 2 (two) times daily. 01/18/20   Derwood Kaplan, MD  Prenatal Vit-Fe Fumarate-FA (PRENATAL VITAMINS PO) Take by mouth.    [provider]  Semaglutide (RYBELSUS) 7 MG TABS Take 1 tablet (7 mg total) by mouth daily. 05/22/22   Pincus Sanes, MD    Family History Family History  Problem Relation Age of Onset   Alzheimer's disease Father    Heart disease Mother    Arthritis Mother    Hypertension Mother    Alzheimer's disease Sister    Cancer Sister 80       breast cancer    Heart disease Brother        Heart Disease before age 55   Breast cancer Sister    Colon cancer Neg Hx    Esophageal cancer Neg Hx    Rectal cancer Neg Hx    Stomach cancer Neg Hx     Social History Social History   Tobacco Use   Smoking status: Never   Smokeless tobacco: Never  Vaping Use   Vaping Use: Never used  Substance Use Topics   Alcohol use: No   Drug use: No     Allergies   Penicillins   Review of Systems Review of Systems Per HPI  Physical Exam Triage Vital Signs ED Triage Vitals  Enc Vitals Group     BP 07/26/22 0900 117/73     Pulse Rate 07/26/22 0900 92     Resp 07/26/22 0900 16     Temp 07/26/22 0900 97.8 F (36.6 C)     Temp Source 07/26/22 0900 Oral     SpO2 07/26/22 0900 96 %     Weight --      Height --      Head Circumference --      Peak Flow --      Pain Score 07/26/22 0901 0     Pain Loc --      Pain Edu? --      Excl. in GC? --    No data found.  Updated Vital Signs BP 117/73 (BP  Location: Left Arm)   Pulse 92   Temp 97.8 F (36.6 C) (Oral)   Resp 16   SpO2 96%   Visual Acuity Right Eye Distance:   Left Eye Distance:   Bilateral Distance:    Right  Eye Near:   Left Eye Near:    Bilateral Near:     Physical Exam Constitutional:      General: She is not in acute distress.    Appearance: Normal appearance. She is not toxic-appearing or diaphoretic.  HENT:     Head: Normocephalic and atraumatic.  Eyes:     Extraocular Movements: Extraocular movements intact.     Conjunctiva/sclera: Conjunctivae normal.  Cardiovascular:     Rate and Rhythm: Normal rate and regular rhythm.     Pulses: Normal pulses.     Heart sounds: Normal heart sounds.  Pulmonary:     Effort: Pulmonary effort is normal. No respiratory distress.     Breath sounds: Normal breath sounds. No stridor. No wheezing, rhonchi or rales.  Neurological:     General: No focal deficit present.     Mental Status: She is alert and oriented to person, place, and time. Mental status is at baseline.  Psychiatric:        Mood and Affect: Mood normal.        Behavior: Behavior normal.        Thought Content: Thought content normal.        Judgment: Judgment normal.      UC Treatments / Results  Labs (all labs ordered are listed, but only abnormal results are displayed) Labs Reviewed  CBC  COMPREHENSIVE METABOLIC PANEL    EKG   Radiology DG Chest 2 View  Result Date: 07/26/2022 CLINICAL DATA:  Shortness of breath. EXAM: CHEST - 2 VIEW COMPARISON:  March 29, 2022. FINDINGS: The heart size and mediastinal contours are within normal limits. Right lung is clear. Minimal left basilar subsegmental atelectasis or scarring is noted. The visualized skeletal structures are unremarkable. IMPRESSION: Minimal left basilar subsegmental atelectasis or scarring. Electronically Signed   By: Lupita Raider M.D.   On: 07/26/2022 10:25    Procedures Procedures (including critical care time)  Medications  Ordered in UC Medications - No data to display  Initial Impression / Assessment and Plan / UC Course  I have reviewed the triage vital signs and the nursing notes.  Pertinent labs & imaging results that were available during my care of the patient were reviewed by me and considered in my medical decision making (see chart for details).     Given patient's symptoms and age, recommended to patient that she go to the emergency department for further evaluation and management given limited resources here in urgent care.  Patient declined going to the ER.  Risks associated with not going to ER discussed with patient.  Patient voiced understanding and accepted risks.  She wished to have limited evaluation here in urgent care.  EKG completed that was normal sinus rhythm and unremarkable.  Chest x-ray completed that showed some left lower atelectasis versus scarring but I do think this could be incidental and not related to patient's symptoms.  Although, I did discuss this with patient.  Basic blood work ordered but clinical staff was not able to obtain it. Patient advised that if symptoms persist or worsen, she is to go straight to the emergency department.  Ambulatory referral to cardiology was placed for patient as well.  Advised patient that if they do not call her within 48 to 72 hours, she is to call them herself at provided contact information.  Also advised follow-up with PCP as soon as possible.  Patient verbalized understanding and was agreeable with plan. Final Clinical Impressions(s) / UC Diagnoses  Final diagnoses:  Shortness of breath  Rapid heart rate     Discharge Instructions      Blood work is pending.  Will call if it is abnormal.  I have placed a referral to cardiology.  If they do not call you in the next 48 to 72 hours, please call them yourself at provided contact information.  Also follow-up with your family medicine doctor.     ED Prescriptions   None    PDMP not  reviewed this encounter.   Gustavus Bryant, [place] 07/26/22 1040    [address], [place] 07/26/22 1053

## 2022-07-27 ENCOUNTER — Encounter (HOSPITAL_BASED_OUTPATIENT_CLINIC_OR_DEPARTMENT_OTHER): Payer: Self-pay | Admitting: Emergency Medicine

## 2022-07-27 ENCOUNTER — Other Ambulatory Visit: Payer: Self-pay

## 2022-07-27 ENCOUNTER — Emergency Department (HOSPITAL_BASED_OUTPATIENT_CLINIC_OR_DEPARTMENT_OTHER)
Admission: EM | Admit: 2022-07-27 | Discharge: 2022-07-27 | Disposition: A | Discharge status: Home / Self Care | Payer: Medicare HMO | Attending: Emergency Medicine | Admitting: Emergency Medicine

## 2022-07-27 ENCOUNTER — Emergency Department (HOSPITAL_BASED_OUTPATIENT_CLINIC_OR_DEPARTMENT_OTHER): Payer: Medicare HMO

## 2022-07-27 DIAGNOSIS — R911 Solitary pulmonary nodule: Secondary | ICD-10-CM | POA: Diagnosis not present

## 2022-07-27 DIAGNOSIS — R0789 Other chest pain: Secondary | ICD-10-CM | POA: Insufficient documentation

## 2022-07-27 DIAGNOSIS — Z7984 Long term (current) use of oral hypoglycemic drugs: Secondary | ICD-10-CM | POA: Diagnosis not present

## 2022-07-27 DIAGNOSIS — R000 Tachycardia, unspecified: Secondary | ICD-10-CM | POA: Diagnosis not present

## 2022-07-27 DIAGNOSIS — R079 Chest pain, unspecified: Secondary | ICD-10-CM | POA: Diagnosis not present

## 2022-07-27 DIAGNOSIS — R0602 Shortness of breath: Secondary | ICD-10-CM | POA: Insufficient documentation

## 2022-07-27 DIAGNOSIS — E119 Type 2 diabetes mellitus without complications: Secondary | ICD-10-CM | POA: Diagnosis not present

## 2022-07-27 LAB — BASIC METABOLIC PANEL
Anion gap: 10 (ref 5–15)
BUN: 10 mg/dL (ref 8–23)
CO2: 24 mmol/L (ref 22–32)
Calcium: 8.6 mg/dL — ABNORMAL LOW (ref 8.9–10.3)
Chloride: 103 mmol/L (ref 98–111)
Creatinine, Ser: 0.86 mg/dL (ref 0.44–1.00)
GFR, Estimated: >60 mL/min (ref >60)
Glucose, Bld: 94 mg/dL (ref 70–99)
Potassium: 3.7 mmol/L (ref 3.5–5.1)
Sodium: 137 mmol/L (ref 135–145)

## 2022-07-27 LAB — CBC
HCT: 37.1 % (ref 36.0–46.0)
Hemoglobin: 12.3 g/dL (ref 12.0–15.0)
MCH: 28.5 pg (ref 26.0–34.0)
MCHC: 33.2 g/dL (ref 30.0–36.0)
MCV: 85.9 fL (ref 80.0–100.0)
Platelets: 227 10*3/uL (ref 150–400)
RBC: 4.32 MIL/uL (ref 3.87–5.11)
RDW: 12.4 % (ref 11.5–15.5)
WBC: 7.8 10*3/uL (ref 4.0–10.5)
nRBC: 0 % (ref 0.0–0.2)

## 2022-07-27 LAB — TROPONIN I (HIGH SENSITIVITY)
Troponin I (High Sensitivity): 2 ng/L (ref <18)
Troponin I (High Sensitivity): 3 ng/L (ref <18)

## 2022-07-27 MED ORDER — IOHEXOL 350 MG/ML SOLN
75.0000 mL | Freq: Once | INTRAVENOUS | Status: AC | PRN
  Administered 2022-07-27: 75 mL via INTRAVENOUS

## 2022-07-27 NOTE — ED Notes (Signed)
Pt. Reports it hurts to breath or feels tight in the middle of her chest.  Pt. Reports she started feeling bad on Sat. And hasn't felt good since.  Pt. Feeling short of breath and has pain with breathing she said.

## 2022-07-27 NOTE — Discharge Instructions (Signed)
You should still follow-up with a cardiologist with your referral, for potential arrhythmias or irregular heart rhythm evaluation.  I do recommend that you follow-up with your oncologist, please contact their office and let them know that you had a CT scan performed of your chest.  They can review your CT scan and determine what other scans you may need for your cancer screening.

## 2022-07-27 NOTE — ED Provider Notes (Signed)
[facility] Provider Note   CSN: [id number] Arrival date & time: 07/27/22  1214     History  Chief Complaint  Patient presents with   Shortness of Breath   Tachycardia    Jessica Farrell is a 77 y.o. female with history of metastatic malignant neuroendocrine tumor, peritoneal metastasis, presenting to the ED with complaint of shortness of breath and tachycardia.  Patient reports that she began to have a racing heart last night, with her Apple Watch showing her heart rate was about 140 bpm.  Her typical resting heart rate is 80 bpm on average she reports.  The tachycardia is improved but she continues have a sense of soreness that is persistent in the left side of her chest, worse with inspiration.  She has never had this feeling before.  She denies any history of PE or DVT.  She denies history of MI or known coronary disease.  She denies smoking history.  She does have high cholesterol and diabetes.  Oncology office evaluation in April by Dr Threasa Beards does note that the patient has a history of pneumonia as well.  HPI     Home Medications Prior to Admission medications   Medication Sig Start Date End Date Taking? Authorizing Provider  albuterol (VENTOLIN HFA) 108 (90 Base) MCG/ACT inhaler INHALE 2 PUFFS EVERY 6 HOURS AS NEEDED FOR WHEEZING OR SHORTNESS OF BREATH 07/28/22   Pincus Sanes, MD  atorvastatin (LIPITOR) 10 MG tablet TAKE 1 TABLET EVERY DAY 05/03/22   Burns, Bobette Mo, MD  benzonatate (TESSALON) 100 MG capsule Take 1 capsule (100 mg total) by mouth every 8 (eight) hours. 02/24/22   Radford Pax, NP  cetirizine (ZYRTEC) 10 MG tablet Take 1 tablet (10 mg total) by mouth daily. 01/18/20   Derwood Kaplan, MD  fluticasone-salmeterol (ADVAIR DISKUS) 500-50 MCG/ACT AEPB Inhale 1 puff into the lungs in the morning and at bedtime. 05/22/22   Pincus Sanes, MD  gabapentin (NEURONTIN) 100 MG capsule Take 1 capsule (100 mg total) by mouth at bedtime.  01/23/20   Pincus Sanes, MD  glimepiride (AMARYL) 2 MG tablet TAKE 1 TABLET BY MOUTH ONCE DAILY BEFORE BREAKFAST 11/08/21   Pincus Sanes, MD  glucose blood test strip Based on insurance preference. Use as instructed once daily to check sugars. Dx E11.9 06/04/18   Pincus Sanes, MD  Lancets [facility] ULTRASOFT) lancets Use as instructed once daily to check sugars. Dx E11.9 05/23/18   Pincus Sanes, MD  levothyroxine (SYNTHROID) 150 MCG tablet Take 1 tablet (150 mcg total) by mouth daily. Take 6 days a week only. 05/23/22   Pincus Sanes, MD  loperamide (IMODIUM) 1 MG/5ML solution Take by mouth as needed for diarrhea or loose stools.    [provider]  montelukast (SINGULAIR) 10 MG tablet TAKE 1 TABLET AT BEDTIME 05/03/22   Pincus Sanes, MD  naproxen (NAPROSYN) 375 MG tablet Take 1 tablet (375 mg total) by mouth 2 (two) times daily. 01/18/20   Derwood Kaplan, MD  Prenatal Vit-Fe Fumarate-FA (PRENATAL VITAMINS PO) Take by mouth.    [provider]  Semaglutide (RYBELSUS) 7 MG TABS Take 1 tablet (7 mg total) by mouth daily. 05/22/22   Pincus Sanes, MD      Allergies    Penicillins    Review of Systems   Review of Systems  Physical Exam Updated Vital Signs BP 136/73   Pulse 86   Temp  98.6 F (37 C) (Oral)   Resp 19   Ht 5\' 5"  (1.651 m)   Wt 72.6 kg   SpO2 96%   BMI 26.63 kg/m  Physical Exam Constitutional:      General: She is not in acute distress. HENT:     Head: Normocephalic and atraumatic.  Eyes:     Conjunctiva/sclera: Conjunctivae normal.     Pupils: Pupils are equal, round, and reactive to light.  Cardiovascular:     Rate and Rhythm: Normal rate and regular rhythm.  Pulmonary:     Effort: Pulmonary effort is normal. No respiratory distress.  Abdominal:     General: There is no distension.     Tenderness: There is no abdominal tenderness.  Skin:    General: Skin is warm and dry.  Neurological:     General: No focal deficit present.     Mental  Status: She is alert. Mental status is at baseline.  Psychiatric:        Mood and Affect: Mood normal.        Behavior: Behavior normal.     ED Results / Procedures / Treatments   Labs (all labs ordered are listed, but only abnormal results are displayed) Labs Reviewed  BASIC METABOLIC PANEL - Abnormal; Notable for the following components:      Result Value   Calcium 8.6 (*)    All other components within normal limits  CBC  TROPONIN I (HIGH SENSITIVITY)  TROPONIN I (HIGH SENSITIVITY)    EKG EKG Interpretation  Date/Time:  Thursday July 27 2022 12:22:54 EDT Ventricular Rate:  100 PR Interval:  142 QRS Duration: 79 QT Interval:  344 QTC Calculation: 444 R Axis:   -20 Text Interpretation: Sinus tachycardia Borderline left axis deviation Low voltage, precordial leads Confirmed by Alvester Chou [phone]) on 07/27/2022 3:20:22 PM  Radiology CT Angio Chest PE W and/or Wo Contrast  Result Date: 07/27/2022 CLINICAL DATA:  Chest pain with breathing. History of colon carcinoma with lung metastatic disease diagnosed in 2015. EXAM: CT ANGIOGRAPHY CHEST WITH CONTRAST TECHNIQUE: Multidetector CT imaging of the chest was performed using the standard protocol during bolus administration of intravenous contrast. Multiplanar CT image reconstructions and MIPs were obtained to evaluate the vascular anatomy. RADIATION DOSE REDUCTION: This exam was performed according to the departmental dose-optimization program which includes automated exposure control, adjustment of the mA and/or kV according to patient size and/or use of iterative reconstruction technique. CONTRAST:  75mL OMNIPAQUE IOHEXOL 350 MG/ML SOLN COMPARISON:  Chest CT, 02/16/2022. FINDINGS: Cardiovascular: Pulmonary arteries are well opacified. There is no evidence of a pulmonary embolism. Heart is normal in size and configuration. Left coronary artery calcifications. No pericardial effusion. Great vessels are normal in caliber. No aortic  dissection or atherosclerosis. Arch branch vessels are widely patent. Mediastinum/Nodes: No neck base, mediastinal or hilar masses. Several prominent lymph nodes. Right subcarinal lymph node, 12 mm in short axis. Right paratracheal node 7 mm in short axis. Trachea and esophagus are unremarkable. Lungs/Pleura: No lung consolidation to suggest pneumonia. There are several lung nodules. Reference measurements are as follows. Left lower lobe nodule, posterior costophrenic sulcus, image 67, series 5, 1.3 cm, previously 1.2 cm. Left lower lobe nodule, subpleural, image 54, series 5, 9 mm, unchanged. Small left upper lobe nodule, image 45, unchanged. Nodule abutting the oblique fissure, right lower lobe, image 44 1 cm in long axis, stable. Right lower lobe nodule, image 53, 1.4 cm, also unchanged. Interstitial thickening noted in the lower lungs.  Additional dependent lower lobe subsegmental atelectasis. Findings are similar to the prior study. No convincing pulmonary edema. No pleural effusion or pneumothorax. Upper Abdomen: No acute findings. No visualized liver or adrenal mass. Status post cholecystectomy. Musculoskeletal: No fracture or acute finding. No bone lesion. No chest wall mass. Review of the MIP images confirms the above findings. IMPRESSION: 1. No evidence of a pulmonary embolism. 2. No acute findings. 3. Lung nodules as detailed, without change compared to the CT dated 02/08/2022. Aortic Atherosclerosis (ICD10-I70.0). Electronically Signed   By: Amie Portland M.D.   On: 07/27/2022 17:01   DG Chest 2 View  Result Date: 07/27/2022 CLINICAL DATA:  Shortness of breath. EXAM: CHEST - 2 VIEW COMPARISON:  07/26/2022 FINDINGS: Similar trace atelectasis or scarring at the left base. The lungs are otherwise clear without focal pneumonia, edema, pneumothorax or pleural effusion. The cardiopericardial silhouette is within normal limits for size. No acute bony abnormality. IMPRESSION: No active cardiopulmonary disease.  Electronically Signed   By: Kennith Center M.D.   On: 07/27/2022 13:28    Procedures Procedures    Medications Ordered in ED Medications  iohexol (OMNIPAQUE) 350 MG/ML injection 75 mL (75 mLs Intravenous Contrast Given 07/27/22 1603)    ED Course/ Medical Decision Making/ A&P Clinical Course as of 07/28/22 1014  Thu Jul 27, 2022  1722 Patient reassessed, remained stable, normal vital signs.  It is possible she is having pleuritic pain from the nodule noted on her lungs.  She does still have a cardiology appointment arranged through urgent care, regarding potential arrhythmia, I think it is reasonable for her to still follow-up.  Patient verbalized understanding. [MT]    Clinical Course User Index [MT] Ambermarie Honeyman, Kermit Balo, MD                             Medical Decision Making Amount and/or Complexity of Data Reviewed Labs: ordered. Radiology: ordered.  Risk Prescription drug management.   This patient presents to the ED with concern for persistent left-sided pleuritic chest pain and discomfort, intermittent tachycardia. This involves an extensive number of treatment options, and is a complaint that carries with it a high risk of complications and morbidity.  The differential diagnosis includes PE versus new tumor encroaching mass versus pneumonia versus other  Co-morbidities that complicate the patient evaluation: History of malignancy at higher risk of thromboembolic events  Additional history obtained from [facility]  External records from outside source obtained and reviewed including oncology office notes  I ordered and personally interpreted labs.  The pertinent results include:  no emergent findings  I ordered imaging studies including x-ray of the chest, CT PE study I independently visualized and interpreted imaging which showed no acute PE, multiple pulmonary nodules noted I agree with the radiologist interpretation  The patient was maintained on a cardiac monitor.   I personally viewed and interpreted the cardiac monitored which showed an underlying rhythm of: Sinus rhythm  Per my interpretation the patient's ECG shows no acute ischemic findings  I have reviewed the patients home medicines and have made adjustments as needed  After the interventions noted above, I reevaluated the patient and found that they have: stayed the same  Patient asymptomatic (no palpitations) for 5 hour observation in the ED.  Remains in NSR.  Dispostion:  After consideration of the diagnostic results and the patients response to treatment, I feel that the patent would benefit from outpatient follow up.  Patient is  aware of pulmonary nodules and has follow up already in place.  She was also referred to cardiology by UC yesterday - reasonable to continue with cards evaluation for potential arrhythmias / zio monitoring.  This seems much less likely an issue of ACS to me.         Final Clinical Impression(s) / ED Diagnoses Final diagnoses:  Chest discomfort  Lung nodule    Rx / DC Orders ED Discharge Orders     None         Terald Sleeper, MD 07/28/22 1015

## 2022-07-27 NOTE — ED Triage Notes (Signed)
Pt with intermittent bouts of tachycardia and shob since Monday. Evaluated at [facility] yesterday.

## 2022-07-27 NOTE — ED Notes (Signed)
Discharge paperwork reviewed entirely with patient, including follow up care. Pain was under control. No prescriptions were called in, but all questions were addressed.  Pt verbalized understanding as well as all parties involved. No questions or concerns voiced at the time of discharge. No acute distress noted.   Pt ambulated out to PVA without incident or assistance.  

## 2022-07-28 ENCOUNTER — Other Ambulatory Visit: Payer: Self-pay

## 2022-07-28 ENCOUNTER — Telehealth: Payer: Self-pay | Admitting: Internal Medicine

## 2022-07-28 MED ORDER — ALBUTEROL SULFATE HFA 108 (90 BASE) MCG/ACT IN AERS: INHALATION_SPRAY | RESPIRATORY_TRACT | 3 refills | Status: AC

## 2022-07-28 NOTE — Telephone Encounter (Signed)
Prescription Request  07/28/2022  LOV: 05/22/2022  What is the name of the medication or equipment? albuterol (VENTOLIN HFA) 108 (90 Base) MCG/ACT inhaler  fluticasone-salmeterol (ADVAIR DISKUS) 500-50 MCG/ACT AEPB  Have you contacted your pharmacy to request a refill? No   [facility] [address] ([address] [place] - [address] [place]) [place] [address] Phone: [phone] Fax: [phone]     Patient notified that their request is being sent to the clinical staff for review and that they should receive a response within 2 business days.   Please advise at Mobile [phone] (mobile)

## 2022-07-28 NOTE — Telephone Encounter (Signed)
Refill sent in today

## 2022-07-31 ENCOUNTER — Other Ambulatory Visit: Payer: Self-pay

## 2022-07-31 ENCOUNTER — Telehealth: Payer: Self-pay | Admitting: Internal Medicine

## 2022-07-31 MED ORDER — FLUTICASONE-SALMETEROL 500-50 MCG/ACT IN AEPB: 1.0000 | INHALATION_SPRAY | Freq: Two times a day (BID) | RESPIRATORY_TRACT | 3 refills | Status: DC

## 2022-07-31 NOTE — Telephone Encounter (Signed)
Prescription Request  07/31/2022  LOV: 05/22/2022  What is the name of the medication or equipment? fluticasone-salmeterol (ADVAIR DISKUS) 500-50 MCG/ACT AEPB  Have you contacted your pharmacy to request a refill? Yes   Which pharmacy would you like this sent to?   [facility] [address] ([address] [place] - [address] [place]) [place] [address] Phone: [phone] Fax: [phone]      Patient notified that their request is being sent to the clinical staff for review and that they should receive a response within 2 business days.   Please advise at [employer] [facility] [phone]

## 2022-07-31 NOTE — Telephone Encounter (Signed)
Updated script sent in for patient today.

## 2022-08-17 ENCOUNTER — Telehealth: Payer: Self-pay | Admitting: Internal Medicine

## 2022-08-17 NOTE — Telephone Encounter (Signed)
Patient has came into the office to drop off a form for the provider to fill out. Labeled and placed in provider box

## 2022-08-18 NOTE — Telephone Encounter (Signed)
Forms placed in providers office

## 2022-08-20 NOTE — Telephone Encounter (Signed)
Please call patient - I need more info on what this form is for

## 2022-08-22 NOTE — Telephone Encounter (Signed)
Form faxed today

## 2022-08-24 NOTE — Assessment & Plan Note (Signed)
pT3 N1 M0, stage IIIB, lung, nodes and peritoneal metastasis 09/2019 -diagnosed 01/2014, s/p resection 02/03/14 showing 3 cm NET involving serosal tissue. Margins negative, but 5/25 positive nodes with extracapsular extension. -10/06/19 Dotatate PET showed metastatic disease to peritoneum, abdominal and mediastinal lymph nodes, and lungs. Indeterminate liver lesions. 10/29/19 peritoneal biopsy confirmed well-differentiated neuroendocrine tumor. -she took Sandostatin injections monthly 11/06/19 - 01/05/20, discontinued due to hyperglycemia, fatigue, and an episode of syncope and fall right after the injection. -for symptom management, she took one dose lanreotide on 02/24/21. This improved her diarrhea, but she developed vision issues in her right eye, and stopped -restaging CT CAP 02/08/2022 showed stable to slightly increased pulmonary nodules and peritoneal mets; otherwise no new signs of metastatic disease. Will continue to monitor given poor tolerance of prior treatment. -we discussed alternative therapy with oral Afinitor if she has disease progression. will continue observation.  Given her overall low disease burden, mild symptoms, will plan to continue observation. -She was seen in the emergency room on July 26, 2022 for chest pain, CT chest with contrast was obtained during her visit, which showed negative PE, stable lung nodules, no other acute findings.  I personally reviewed and discussed with patient. -She is due for restaging CT scans, will schedul for her in the next 2 to 4 weeks.

## 2022-08-25 ENCOUNTER — Inpatient Hospital Stay: Payer: Medicare HMO | Attending: Hematology

## 2022-08-25 ENCOUNTER — Encounter: Payer: Self-pay | Admitting: Hematology

## 2022-08-25 ENCOUNTER — Inpatient Hospital Stay: Payer: Medicare HMO | Admitting: Hematology

## 2022-08-25 ENCOUNTER — Other Ambulatory Visit: Payer: Self-pay

## 2022-08-25 VITALS — BP 119/68 | HR 61 | Temp 98.1°F | Resp 14 | Ht 65.0 in | Wt 157.4 lb

## 2022-08-25 DIAGNOSIS — C7A8 Other malignant neuroendocrine tumors: Secondary | ICD-10-CM | POA: Diagnosis not present

## 2022-08-25 DIAGNOSIS — D518 Other vitamin B12 deficiency anemias: Secondary | ICD-10-CM | POA: Diagnosis not present

## 2022-08-25 DIAGNOSIS — C7B8 Other secondary neuroendocrine tumors: Secondary | ICD-10-CM

## 2022-08-25 LAB — CMP (CANCER CENTER ONLY)
ALT: 16 U/L (ref 0–44)
AST: 14 U/L — ABNORMAL LOW (ref 15–41)
Albumin: 3.5 g/dL (ref 3.5–5.0)
Alkaline Phosphatase: 125 U/L (ref 38–126)
Anion gap: 7 (ref 5–15)
BUN: 19 mg/dL (ref 8–23)
CO2: 27 mmol/L (ref 22–32)
Calcium: 9 mg/dL (ref 8.9–10.3)
Chloride: 104 mmol/L (ref 98–111)
Creatinine: 0.94 mg/dL (ref 0.44–1.00)
GFR, Estimated: >60 mL/min (ref >60)
Glucose, Bld: 108 mg/dL — ABNORMAL HIGH (ref 70–99)
Potassium: 4.3 mmol/L (ref 3.5–5.1)
Sodium: 138 mmol/L (ref 135–145)
Total Bilirubin: 0.5 mg/dL (ref 0.3–1.2)
Total Protein: 6.1 g/dL — ABNORMAL LOW (ref 6.5–8.1)

## 2022-08-25 LAB — CBC WITH DIFFERENTIAL (CANCER CENTER ONLY)
Abs Immature Granulocytes: 0.01 10*3/uL (ref 0.00–0.07)
Basophils Absolute: 0.1 10*3/uL (ref 0.0–0.1)
Basophils Relative: 1 %
Eosinophils Absolute: 0.2 10*3/uL (ref 0.0–0.5)
Eosinophils Relative: 4 %
HCT: 36.1 % (ref 36.0–46.0)
Hemoglobin: 11.7 g/dL — ABNORMAL LOW (ref 12.0–15.0)
Immature Granulocytes: 0 %
Lymphocytes Relative: 29 %
Lymphs Abs: 1.8 10*3/uL (ref 0.7–4.0)
MCH: 29 pg (ref 26.0–34.0)
MCHC: 32.4 g/dL (ref 30.0–36.0)
MCV: 89.6 fL (ref 80.0–100.0)
Monocytes Absolute: 0.5 10*3/uL (ref 0.1–1.0)
Monocytes Relative: 8 %
Neutro Abs: 3.8 10*3/uL (ref 1.7–7.7)
Neutrophils Relative %: 58 %
Platelet Count: 202 10*3/uL (ref 150–400)
RBC: 4.03 MIL/uL (ref 3.87–5.11)
RDW: 12.7 % (ref 11.5–15.5)
WBC Count: 6.4 10*3/uL (ref 4.0–10.5)
nRBC: 0 % (ref 0.0–0.2)

## 2022-08-25 NOTE — Progress Notes (Signed)
[facility]   Telephone:(336) [phone] [phone]   Clinic Follow up Note   Patient Care Team: Pincus Sanes, MD as PCP - General (Internal Medicine) Malachy Mood, MD as Consulting Physician (Hematology) Avel Peace, MD as Consulting Physician (General Surgery) Szabat, Vinnie Level, [facility] (Inactive) as Pharmacist (Pharmacist) Elwin Mocha, MD as Consulting Physician (Ophthalmology)  Date of Service:  08/25/2022  CHIEF COMPLAINT: f/u of metastatic carcinoid tumor     CURRENT THERAPY:  Surveillance     ASSESSMENT:  Jessica Farrell is a 77 y.o. 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 Margins negative, but 5/25 positive nodes with extracapsular extension. -10/06/19 Dotatate PET showed metastatic disease to peritoneum, abdominal and mediastinal lymph nodes, and lungs. Indeterminate liver lesions. 10/29/19 peritoneal biopsy confirmed well-differentiated neuroendocrine tumor. -she took Sandostatin injections monthly 11/06/19 - 01/05/20, discontinued due to hyperglycemia, fatigue, and an episode of syncope and fall right after the injection. -for symptom management, she took one dose lanreotide on 02/24/21. This improved her diarrhea, but she developed vision issues in her right eye, and stopped -restaging CT CAP 02/08/2022 showed stable to slightly increased pulmonary nodules and peritoneal mets; otherwise no new signs of metastatic disease. Will continue to monitor given poor tolerance of prior treatment. -we discussed alternative therapy with oral Afinitor if she has disease progression. will continue observation.  Given her overall low disease burden, mild symptoms, will plan to continue observation. -She was seen in the emergency room on July 26, 2022 for chest pain, CT chest  with contrast was obtained during her visit, which showed negative PE, stable lung nodules, no other acute findings.  I personally reviewed and discussed with patient. -She is due for restaging CT scans, will schedul for her in the next 2 to 4 weeks.     PLAN: -Reviewed Ct chest scan from last month with pt. - Collect 24 hr urine for 5HIAA -CT abdomen and pelvis in 3 weeks, will call her with results  -lab and f/u in 3 months -Continue observation    SUMMARY OF ONCOLOGIC HISTORY: Oncology History Overview Note  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   Staging form: Colon and Rectum, AJCC 7th Edition     Clinical: T3, N2, M0 - Unsigned     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 (HCC)  01/21/2014 Imaging   CT: Distal small bowel obstruction due to 3.4 cm soft tissue mass near the ileocecal valve, with adjacent right lower quadrant mesenteric lymphadenopathy   02/03/2014 Pathologic Stage   invasive well diff neuroendocrine tumor (carcinoid), 3cm, pT3pN2 (5/25 nodes positive). negative margines.   02/03/2014 Surgery   Laparoscopic-assisted right colectomy and resection of distal ileum   02/03/2014 Initial Diagnosis   Metastatic malignant neuroendocrine tumor of the ileocecal valve to regional lymph nodes.    05/31/2015 Imaging   CT A/P w contrast  IMPRESSION: 1. No evidence of metastatic disease. 2. Question mild basilar subpleural pulmonary fibrosis.   09/11/2019 Imaging   CT AP W contrast    IMPRESSION: 1. Redemonstrated postoperative findings of ileocolectomy and reanastomosis. 2. There are prominent lymph nodes in the right mesocolon which are slightly increased in size compared to prior examination, the largest node measuring 1.8 x 0.7 cm, previously 1.5 x 0.4 cm. Findings are nonspecific although concerning for nodal metastatic disease. 3.  There are additional new small nodules of the transverse mesocolon or omentum measuring up  to 9 mm, likewise concerning for nodal or omental metastatic disease. 4. Redemonstrated broad-based midline, fat containing ventral hernia components. 5. Fluid in the endometrial cavity, abnormal in the late postmenopausal setting although unchanged compared to prior examination. 6. Status post interval cholecystectomy. 7. Aortic Atherosclerosis (ICD10-I70.0).   10/06/2019 PET scan   IMPRESSION: 1. Several small nodules in the peritoneal space and retroperitoneal space with intense radiotracer activity consistent with metastatic well differentiated neuroendocrine tumor. 2. Several small liver lesions above background activity are indeterminate. Consider MRI liver with and without contrast. 3. Radiotracer activity within mediastinal lymph nodes and bilateral pulmonary nodules consistent with metastatic well-differentiated neuroendocrine tumor to the lungs. Activity is very low within these lesions but measurable. 4. No clear evidence skeletal metastasis. Single lesion in the posterior LEFT SI joint warrants attention on follow-up. 5. Although there are multiple sites of metastatic disease, the overall tumor burden of metastatic disease very small.   10/28/2019 Relapse/Recurrence   FINAL MICROSCOPIC DIAGNOSIS:   A. PERITONEAL NODULES, BIOPSY:  -  Well-differentiated neuroendocrine tumor (carcinoid)  -  See comment   COMMENT:   By immunohistochemistry, the neoplastic cells are positive for CD56,  chromogranin and synaptophysin with a low proliferative rate by Ki-67  (less than 1%).  These findings are consistent with metastasis of the  patient's previously diagnosed small bowel carcinoid.  These findings  were discussed with Dr. Blake Divine on October 30, 2019.    11/06/2019 -  Chemotherapy   First-line Monthly Sandostatin injection starting 11/06/19. Held since 01/05/20 due to poor toleration ( hypoglycemia, fatigue, and the episode of syncope and fall right after the injection. The  diarrhea and flushing has, but overall symptoms are mild)    05/03/2020 Imaging   CT CAP  IMPRESSION: 1. Stable exam. No new or progressive interval findings. 2. Scattered tiny lung nodules are stable since PET-CT of 10/06/2019. These were hypermetabolic on that exam consistent with metastatic disease. 3. Multiple small omental and mesenteric soft tissue nodules are similar to prior. These were also noted to be hypermetabolic on the PET study. 4. Status post ileocolectomy. 5. Left colonic diverticulosis without diverticulitis. 6. Aortic Atherosclerosis (ICD10-I70.0).     02/08/2021 Imaging   EXAM: CT CHEST, ABDOMEN, AND PELVIS WITH CONTRAST  IMPRESSION: 1. Three of the pulmonary nodules have intervally enlarged, suggesting mild progression of malignancy. The remaining pulmonary nodules in the omental nodules appear stable, and no definite new nodule is identified. 2. Other imaging findings of potential clinical significance: Aortic Atherosclerosis (ICD10-I70.0). Coronary atherosclerosis. Systemic atherosclerosis. Complex ventral supraumbilical hernia with multiple defects and lobulations. Lumbar spondylosis and degenerative disc disease   07/28/2021 Imaging   EXAM: CT CHEST, ABDOMEN, AND PELVIS WITH CONTRAST  IMPRESSION: 1. Slight increased size of several pulmonary nodules, as detailed above, concerning for mild progression of metastatic disease to the lungs. In addition, there are findings in the left lower lobe which could be of infectious or inflammatory etiology, although the possibility of developing lymphangitic spread of tumor in the left lower lobe is not excluded. Further clinical evaluation and attention on follow-up studies is recommended. 2. Multiple small soft tissue human plants in the peritoneal cavity appear very similar to the prior examination. No other new signs of metastatic disease noted elsewhere in the abdomen or pelvis. 3. Aortic atherosclerosis, in  addition to left anterior descending coronary artery disease. Assessment for potential risk factor modification, dietary therapy or  pharmacologic therapy may be warranted, if clinically indicated. 4. Colonic diverticulosis without evidence of acute diverticulitis at this time. 5. Additional incidental findings, similar to prior study, as above.      INTERVAL HISTORY:  Jessica Farrell is here for a follow up of metastatic carcinoid tumor . She was last seen by me on 05/26/2022. She presents to the clinic alone. Pt state that she was in the ED for elevated heart rate and chest pain. Pt state that she had her Advair refill and that has helped. Pt state that she has appointment with an cardiologist.      All other systems were reviewed with the patient and are negative.  MEDICAL HISTORY:  Past Medical History:  Diagnosis Date   Asthma    Cellulitis March  2014   Left foot and ankle   colon ca dx'd 01/2014   Colon polyps    2015    Diabetes mellitus (HCC) 05/05/2012   Type II   Hyperlipidemia    Morbid obesity (HCC)    Thyroid disease 1990's   HypoThyroidism    SURGICAL HISTORY: Past Surgical History:  Procedure Laterality Date   CHOLECYSTECTOMY N/A 05/02/2017   Procedure: LAPAROSCOPIC CHOLECYSTECTOMY WITH INTRAOPERATIVE CHOLANGIOGRAM, VENTRAL HERNIA REPAIR;  Surgeon: Griselda Miner, MD;  Location: [facility];  Service: General;  Laterality: N/A;   COLON RESECTION N/A 02/03/2014   Procedure: LAPAROSCOPIC ASSISTED BOWEL RESECTION;  Surgeon: Avel Peace, MD;  Location: [facility];  Service: General;  Laterality: N/A;   FRACTURE SURGERY  2000   Ankle   TOOTH EXTRACTION     wisdom Teeth    I have reviewed the social history and family history with the patient and they are unchanged from previous note.  ALLERGIES:  is allergic to penicillins.  MEDICATIONS:  Current Outpatient Medications  Medication Sig Dispense Refill   albuterol (VENTOLIN HFA) 108 (90 Base) MCG/ACT inhaler  INHALE 2 PUFFS EVERY 6 HOURS AS NEEDED FOR WHEEZING OR SHORTNESS OF BREATH 54 g 3   atorvastatin (LIPITOR) 10 MG tablet TAKE 1 TABLET EVERY DAY 90 tablet 3   benzonatate (TESSALON) 100 MG capsule Take 1 capsule (100 mg total) by mouth every 8 (eight) hours. 21 capsule 0   cetirizine (ZYRTEC) 10 MG tablet Take 1 tablet (10 mg total) by mouth daily. 15 tablet 0   fluticasone-salmeterol (ADVAIR DISKUS) 500-50 MCG/ACT AEPB Inhale 1 puff into the lungs in the morning and at bedtime. 180 each 3   gabapentin (NEURONTIN) 100 MG capsule Take 1 capsule (100 mg total) by mouth at bedtime. 90 capsule 1   glimepiride (AMARYL) 2 MG tablet TAKE 1 TABLET BY MOUTH ONCE DAILY BEFORE BREAKFAST 30 tablet 0   glucose blood test strip Based on insurance preference. Use as instructed once daily to check sugars. Dx E11.9 100 each 12   Lancets (ONETOUCH ULTRASOFT) lancets Use as instructed once daily to check sugars. Dx E11.9 100 each 12   levothyroxine (SYNTHROID) 150 MCG tablet Take 1 tablet (150 mcg total) by mouth daily. Take 6 days a week only. 90 tablet 3   loperamide (IMODIUM) 1 MG/5ML solution Take by mouth as needed for diarrhea or loose stools.     montelukast (SINGULAIR) 10 MG tablet TAKE 1 TABLET AT BEDTIME 90 tablet 3   naproxen (NAPROSYN) 375 MG tablet Take 1 tablet (375 mg total) by mouth 2 (two) times daily. 20 tablet 0   Prenatal Vit-Fe Fumarate-FA (PRENATAL VITAMINS PO) Take by mouth.  Semaglutide (RYBELSUS) 7 MG TABS Take 1 tablet (7 mg total) by mouth daily. 90 tablet 1   No current facility-administered medications for this visit.    PHYSICAL EXAMINATION: ECOG PERFORMANCE STATUS: 0 - Asymptomatic  Vitals:   08/25/22 0829  BP: 119/68  Pulse: 61  Resp: 14  Temp: 98.1 F (36.7 C)  SpO2: 99%   Wt Readings from Last 3 Encounters:  08/25/22 157 lb 6.4 oz (71.4 kg)  07/27/22 160 lb (72.6 kg)  05/26/22 164 lb 6.4 oz (74.6 kg)     GENERAL:alert, no distress and comfortable SKIN: skin  color normal, no rashes or significant lesions EYES: normal, Conjunctiva are pink and non-injected, sclera clear  NEURO: alert & oriented x 3 with fluent speech  NECK: (-)supple, thyroid normal size, non-tender, without nodularity LYMPH: (-) no palpable lymphadenopathy in the cervical, axillary  ABDOMEN:(-)abdomen soft, non-tender and normal bowel sounds  LABORATORY DATA:  I have reviewed the data as listed    Latest Ref Rng & Units 08/25/2022    8:08 AM 07/27/2022   12:33 PM 05/26/2022    1:07 PM  CBC  WBC 4.0 - 10.5 K/uL 6.4  7.8  7.0   Hemoglobin 12.0 - 15.0 g/dL 16.1  09.6  04.5   Hematocrit 36.0 - 46.0 % 36.1  37.1  36.9   Platelets 150 - 400 K/uL 202  227  217         Latest Ref Rng & Units 08/25/2022    8:08 AM 07/27/2022   12:33 PM 05/26/2022    1:07 PM  CMP  Glucose 70 - 99 mg/dL 409  94  811   BUN 8 - 23 mg/dL 19  10  11    Creatinine 0.44 - 1.00 mg/dL 9.14  7.82  9.56   Sodium 135 - 145 mmol/L 138  137  143   Potassium 3.5 - 5.1 mmol/L 4.3  3.7  4.0   Chloride 98 - 111 mmol/L 104  103  107   CO2 22 - 32 mmol/L 27  24  29    Calcium 8.9 - 10.3 mg/dL 9.0  8.6  9.5   Total Protein 6.5 - 8.1 g/dL 6.1   6.9   Total Bilirubin 0.3 - 1.2 mg/dL 0.5   0.4   Alkaline Phos 38 - 126 U/L 125   119   AST 15 - 41 U/L 14   17   ALT 0 - 44 U/L 16   15       RADIOGRAPHIC STUDIES: I have personally reviewed the radiological images as listed and agreed with the findings in the report. No results found.    No orders of the defined types were placed in this encounter.  All questions were answered. The patient knows to call the clinic with any problems, questions or concerns. No barriers to learning was detected. The total time spent in the appointment was 25 minutes.     Malachy Mood, MD 08/25/2022   Carolin Coy, CMA, am acting as scribe for Malachy Mood, MD.   I have reviewed the above documentation for accuracy and completeness, and I agree with the above.

## 2022-08-28 DIAGNOSIS — C7B8 Other secondary neuroendocrine tumors: Secondary | ICD-10-CM | POA: Diagnosis not present

## 2022-08-28 DIAGNOSIS — C7A8 Other malignant neuroendocrine tumors: Secondary | ICD-10-CM | POA: Diagnosis not present

## 2022-08-28 LAB — CHROMOGRANIN A: Chromogranin A (ng/mL): 198.5 ng/mL — ABNORMAL HIGH (ref 0.0–101.8)

## 2022-08-30 LAB — 5 HIAA, QUANTITATIVE, URINE, 24 HOUR
5-HIAA, Ur: 26.3 mg/L
5-HIAA,Quant.,24 Hr Urine: 18.4 mg/24 hr — ABNORMAL HIGH (ref 0.0–14.9)
Total Volume: 700

## 2022-09-19 ENCOUNTER — Ambulatory Visit (HOSPITAL_COMMUNITY): Payer: Medicare HMO

## 2022-09-20 ENCOUNTER — Ambulatory Visit (HOSPITAL_COMMUNITY)
Admission: RE | Admit: 2022-09-20 | Discharge: 2022-09-20 | Disposition: A | Discharge status: Home / Self Care | Payer: Medicare HMO | Source: Ambulatory Visit | Attending: Hematology | Admitting: Hematology

## 2022-09-20 ENCOUNTER — Other Ambulatory Visit: Payer: Self-pay

## 2022-09-20 ENCOUNTER — Other Ambulatory Visit: Payer: Self-pay | Admitting: Hematology

## 2022-09-20 DIAGNOSIS — C7B8 Other secondary neuroendocrine tumors: Secondary | ICD-10-CM

## 2022-09-20 DIAGNOSIS — R918 Other nonspecific abnormal finding of lung field: Secondary | ICD-10-CM | POA: Insufficient documentation

## 2022-09-20 DIAGNOSIS — C7A029 Malignant carcinoid tumor of the large intestine, unspecified portion: Secondary | ICD-10-CM | POA: Diagnosis not present

## 2022-09-20 DIAGNOSIS — K5750 Diverticulosis of both small and large intestine without perforation or abscess without bleeding: Secondary | ICD-10-CM | POA: Diagnosis not present

## 2022-09-20 DIAGNOSIS — C786 Secondary malignant neoplasm of retroperitoneum and peritoneum: Secondary | ICD-10-CM | POA: Diagnosis not present

## 2022-09-20 DIAGNOSIS — I700 Atherosclerosis of aorta: Secondary | ICD-10-CM | POA: Diagnosis not present

## 2022-09-20 MED ORDER — IOHEXOL 300 MG/ML  SOLN
100.0000 mL | Freq: Once | INTRAMUSCULAR | Status: AC | PRN
  Administered 2022-09-20: 100 mL via INTRAVENOUS

## 2022-09-20 MED ORDER — IOHEXOL 9 MG/ML PO SOLN
1000.0000 mL | ORAL | Status: AC
  Administered 2022-09-20: 1000 mL via ORAL

## 2022-09-20 MED ORDER — SODIUM CHLORIDE (PF) 0.9 % IJ SOLN
INTRAMUSCULAR | Status: AC
  Filled 2022-09-20: qty 50

## 2022-09-20 NOTE — Plan of Care (Signed)
  Evaluation after Contrast Extravasation  Patient seen and examined immediately after contrast extravasation while in CT holding. Patient with previous IV in the left [facility] which has been removed at the time of my exam. Per CT tech ~ 100 cc of Omnipaque 300 extravasation into the left upper arm. Per patient arm is sore but otherwise denies complaints.  Exam: There is moderate swelling and firmness at the left [facility] area which tracks up to just below the left axilla. There is no erythema. There is no discoloration. There are no blisters. There are no signs of decreased perfusion of the skin. Capillary refill < 2 seconds. It is warm to touch.  The patient has full ROM in fingers, able to easily raise left arm. Neurologically in tact.   Patient instructed to keep an ice pack on the area for 20-60 minutes every 2 hours while awake for the next 48 hours. She may switch to heat 20-60 minutes every 2 hours while awake for an additional 48 hours if swelling persists.    Keep arm elevated as much as possible.   The patient understands to call the radiology department if there is: - increase in pain or swelling - changed or altered sensation - ulceration or blistering - increasing redness - warmth or increasing firmness - decreased tissue perfusion as noted by decreased capillary refill or discoloration of skin - decreased pulses peripheral to site  Patient given contrast extravasation information sheet and radiology contact information.  Villa Herb PA-C 09/20/2022 10:13 AM

## 2022-09-27 ENCOUNTER — Encounter: Payer: Self-pay | Admitting: Hematology

## 2022-10-02 ENCOUNTER — Telehealth: Payer: Self-pay

## 2022-10-02 NOTE — Telephone Encounter (Addendum)
Called patient to relay message below as per Dr.feng. Paatient voiced full understanding.  ----- Message from Malachy Mood sent at 10/01/2022  6:38 PM EDT ----- Please let pt know her CT result, overall very mild disease progression in lung and peritoneum, overall disease burden is very low and OK to continue observation. Thanks   Malachy Mood

## 2022-10-03 ENCOUNTER — Ambulatory Visit (HOSPITAL_BASED_OUTPATIENT_CLINIC_OR_DEPARTMENT_OTHER): Payer: Medicare HMO | Admitting: Cardiology

## 2022-10-03 ENCOUNTER — Encounter (HOSPITAL_BASED_OUTPATIENT_CLINIC_OR_DEPARTMENT_OTHER): Payer: Self-pay | Admitting: Cardiology

## 2022-10-03 VITALS — BP 130/70 | HR 88 | Ht 65.0 in | Wt 155.8 lb

## 2022-10-03 DIAGNOSIS — R079 Chest pain, unspecified: Secondary | ICD-10-CM | POA: Diagnosis not present

## 2022-10-03 DIAGNOSIS — R0602 Shortness of breath: Secondary | ICD-10-CM | POA: Diagnosis not present

## 2022-10-03 DIAGNOSIS — C7B8 Other secondary neuroendocrine tumors: Secondary | ICD-10-CM

## 2022-10-03 DIAGNOSIS — Z9189 Other specified personal risk factors, not elsewhere classified: Secondary | ICD-10-CM

## 2022-10-03 DIAGNOSIS — I700 Atherosclerosis of aorta: Secondary | ICD-10-CM

## 2022-10-03 DIAGNOSIS — R002 Palpitations: Secondary | ICD-10-CM

## 2022-10-03 NOTE — Progress Notes (Signed)
Cardiology Office Note:  .   Date:  10/03/2022  ID:  Jessica Farrell, DOB Sep 13, 1945, MRN [id number] PCP: Pincus Sanes, MD  [facility] Providers Cardiologist:  None {  History of Present Illness: .   Jessica Farrell is a 77 y.o. female with PMH asthma, metastatic malignant neuroendocrine cancer who is seen for shortness of breath, palpitations today.  Today: Had a day back in June when her smart watch gave her a warning for high heart rate, read in the 140s. Felt her heart racing, chest was tight. She was at work at the time. Went to urgent care, then ER. No clear etiology found. Since then her resting heart rate has been 80s-100s, had one more episode when HR on watch was 120 bpm but she felt fine at that time.  Patient has a history of prior pneumonia, has chronic congestion. Has chronic cough with phelgm. Feels that breathing has gradually decreased over time, but it has not prevented her from working part time as a Conservation officer, nature at [employer]. Able to lift, walk, etc.   No recent syncope, one episode of remote syncope with administratino of chemotherapy.  ROS: Denies PND, orthopnea, LE edema or unexpected weight gain. ROS otherwise negative except as noted.   Studies Reviewed: Marland Kitchen    EKG:  EKG Interpretation Date/Time:  Tuesday October 03 2022 09:43:10 EDT Ventricular Rate:  88 PR Interval:  140 QRS Duration:  74 QT Interval:  360 QTC Calculation: 435 R Axis:   -13  Text Interpretation: Normal sinus rhythm Normal ECG Confirmed by Jodelle Red [phone]) on 10/03/2022 9:58:09 AM    Physical Exam:   VS:  BP 130/70 (BP Location: Left Arm, Patient Position: Sitting, Cuff Size: Normal)   Pulse 88   Ht 5\' 5"  (1.651 m)   Wt 155 lb 12.8 oz (70.7 kg)   BMI 25.93 kg/m    Wt Readings from Last 3 Encounters:  10/03/22 155 lb 12.8 oz (70.7 kg)  08/25/22 157 lb 6.4 oz (71.4 kg)  07/27/22 160 lb (72.6 kg)    GEN: Well nourished, well developed in no acute distress HEENT:  Normal, moist mucous membranes NECK: No JVD CARDIAC: regular rhythm, normal S1 and S2, no rubs or gallops. No murmur. VASCULAR: Radial and DP pulses 2+ bilaterally. No carotid bruits RESPIRATORY:  Clear to auscultation without rales, wheezing or rhonchi  ABDOMEN: Soft, non-tender, non-distended MUSCULOSKELETAL:  Ambulates independently SKIN: Warm and dry, no edema NEUROLOGIC:  Alert and oriented x 3. No focal neuro deficits noted. PSYCHIATRIC:  Normal affect    ASSESSMENT AND PLAN: .    Shortness of breath Palpitations Chest tightness -nonexertional, intermittent, limited -will check echo -she will call me if palpitations become more frequent and we will get a monitor  Aortic atherosclerosis Elevated ASCVD risk -on atorvastatin  CV risk counseling and prevention -recommend heart healthy/Mediterranean diet, with whole grains, fruits, vegetable, fish, lean meats, nuts, and olive oil. Limit salt. -recommend moderate walking, 3-5 times/week for 30-50 minutes each session. Aim for at least 150 minutes.week. Goal should be pace of 3 miles/hours, or walking 1.5 miles in 30 minutes -recommend avoidance of tobacco products. Avoid excess alcohol. -ASCVD risk score: The 10-year ASCVD risk score (Arnett DK, et al., 2019) is: 34.2%   Values used to calculate the score:     Age: 77 years     Sex: Female     Is Non-Hispanic African American: No     Diabetic: Yes     Tobacco  smoker: No     Systolic Blood Pressure: 130 mmHg     Is BP treated: No     HDL Cholesterol: 37 mg/dL     Total Cholesterol: 136 mg/dL    Dispo: to be determined based on symptoms and results of testing  Signed, Jodelle Red, MD   Jodelle Red, MD, PhD, [facility]  [facility]  [place]  Heart & Vascular at [facility] [address] [place], [place] [address] [phone]

## 2022-10-03 NOTE — Patient Instructions (Signed)
Medication Instructions:  Continue same medications   Lab Work: None ordered   Testing/Procedures: Echo   Follow-Up: At [facility], you and your health needs are our priority.  As part of our continuing mission to provide you with exceptional heart care, we have created designated Provider Care Teams.  These Care Teams include your primary Cardiologist (physician) and Advanced Practice Providers (APPs -  Physician Assistants and Nurse Practitioners) who all work together to provide you with the care you need, when you need it.  We recommend signing up for the patient portal called "MyChart".  Sign up information is provided on this After Visit Summary.  MyChart is used to connect with patients for Virtual Visits (Telemedicine).  Patients are able to view lab/test results, encounter notes, upcoming appointments, etc.  Non-urgent messages can be sent to your provider as well.   To learn more about what you can do with MyChart, go to [email or web address].    Your next appointment:  As Needed    Provider:  Dr.Christopher

## 2022-10-27 ENCOUNTER — Ambulatory Visit (HOSPITAL_BASED_OUTPATIENT_CLINIC_OR_DEPARTMENT_OTHER): Payer: Medicare HMO

## 2022-10-27 DIAGNOSIS — R002 Palpitations: Secondary | ICD-10-CM

## 2022-10-27 DIAGNOSIS — I700 Atherosclerosis of aorta: Secondary | ICD-10-CM

## 2022-10-27 DIAGNOSIS — Z9189 Other specified personal risk factors, not elsewhere classified: Secondary | ICD-10-CM

## 2022-10-27 DIAGNOSIS — R0602 Shortness of breath: Secondary | ICD-10-CM | POA: Diagnosis not present

## 2022-10-27 DIAGNOSIS — C7B8 Other secondary neuroendocrine tumors: Secondary | ICD-10-CM

## 2022-10-27 DIAGNOSIS — R079 Chest pain, unspecified: Secondary | ICD-10-CM

## 2022-10-27 LAB — ECHOCARDIOGRAM COMPLETE
AR max vel: 2.35 cm2
AV Area VTI: 2.23 cm2
AV Area mean vel: 2.26 cm2
AV Mean grad: 4 mmHg
AV Peak grad: 7.2 mmHg
Ao pk vel: 1.34 m/s
Area-P 1/2: 3.4 cm2
S' Lateral: 2.35 cm

## 2022-11-02 ENCOUNTER — Telehealth (HOSPITAL_BASED_OUTPATIENT_CLINIC_OR_DEPARTMENT_OTHER): Payer: Self-pay | Admitting: Cardiology

## 2022-11-02 NOTE — Telephone Encounter (Signed)
Left messaged as soon as reviewed by Dr Cristal Deer will let her know

## 2022-11-02 NOTE — Telephone Encounter (Signed)
Patient called to follow-up on her test results and noted can leave a voice message.

## 2022-11-20 ENCOUNTER — Encounter: Payer: Self-pay | Admitting: Internal Medicine

## 2022-11-20 NOTE — Patient Instructions (Addendum)
    Flu immunization administered today.     Blood work was ordered.   The lab is on the first floor.    Medications changes include :   prednisone 20 mg daily x 5 days,  doxycycline twice daily x 7 days     Return in about 6 months (around 05/22/2023) for Physical Exam.

## 2022-11-20 NOTE — Progress Notes (Unsigned)
Subjective:    Patient ID: Jessica Farrell, female    DOB: 1945/07/15, 77 y.o.   MRN: [id number]     HPI Jessica Farrell is here for follow up of her chronic medical problems.  Having runny nose.   Still with cough - sounds wet.  Some SOB and chest tightness.  HR usually 90-120.  She does not feel it.  Know by her watch.    Medications and allergies reviewed with patient and updated if appropriate.  Current Outpatient Medications on File Prior to Visit  Medication Sig Dispense Refill   albuterol (VENTOLIN HFA) 108 (90 Base) MCG/ACT inhaler INHALE 2 PUFFS EVERY 6 HOURS AS NEEDED FOR WHEEZING OR SHORTNESS OF BREATH 54 g 3   atorvastatin (LIPITOR) 10 MG tablet TAKE 1 TABLET EVERY DAY 90 tablet 3   cetirizine (ZYRTEC) 10 MG tablet Take 1 tablet (10 mg total) by mouth daily. 15 tablet 0   fluticasone-salmeterol (ADVAIR DISKUS) 500-50 MCG/ACT AEPB Inhale 1 puff into the lungs in the morning and at bedtime. 180 each 3   glimepiride (AMARYL) 2 MG tablet TAKE 1 TABLET BY MOUTH ONCE DAILY BEFORE BREAKFAST 30 tablet 0   glucose blood test strip Based on insurance preference. Use as instructed once daily to check sugars. Dx E11.9 100 each 12   Lancets (ONETOUCH ULTRASOFT) lancets Use as instructed once daily to check sugars. Dx E11.9 100 each 12   levothyroxine (SYNTHROID) 150 MCG tablet Take 1 tablet (150 mcg total) by mouth daily. Take 6 days a week only. 90 tablet 3   loperamide (IMODIUM) 1 MG/5ML solution Take by mouth as needed for diarrhea or loose stools.     montelukast (SINGULAIR) 10 MG tablet TAKE 1 TABLET AT BEDTIME 90 tablet 3   naproxen (NAPROSYN) 375 MG tablet Take 1 tablet (375 mg total) by mouth 2 (two) times daily. 20 tablet 0   Prenatal Vit-Fe Fumarate-FA (PRENATAL VITAMINS PO) Take by mouth.     Semaglutide (RYBELSUS) 7 MG TABS Take 1 tablet (7 mg total) by mouth daily. 90 tablet 1   No current facility-administered medications on file prior to visit.     Review of  Systems  Constitutional:  Negative for fever.  HENT:  Positive for rhinorrhea and sinus pressure (little). Negative for congestion and sore throat.   Respiratory:  Positive for cough (productive in am - yellow mucus), chest tightness and shortness of breath. Negative for wheezing.   Cardiovascular:  Negative for chest pain, palpitations and leg swelling.  Neurological:  Negative for light-headedness and headaches.       Objective:   Vitals:   11/21/22 0812  BP: 112/64  Pulse: 65  Temp: 98 F (36.7 C)  SpO2: 97%   BP Readings from Last 3 Encounters:  11/21/22 112/64  10/03/22 130/70  08/25/22 119/68   Wt Readings from Last 3 Encounters:  11/21/22 147 lb (66.7 kg)  10/03/22 155 lb 12.8 oz (70.7 kg)  08/25/22 157 lb 6.4 oz (71.4 kg)   Body mass index is 24.46 kg/m.    Physical Exam Constitutional:      General: She is not in acute distress.    Appearance: Normal appearance.  HENT:     Head: Normocephalic and atraumatic.  Eyes:     Conjunctiva/sclera: Conjunctivae normal.  Cardiovascular:     Rate and Rhythm: Normal rate and regular rhythm.     Heart sounds: Normal heart sounds.  Pulmonary:     Effort: Pulmonary effort  is normal. No respiratory distress.     Breath sounds: Wheezing (mild expiratory wheeze) present.  Musculoskeletal:     Cervical back: Neck supple.     Right lower leg: No edema.     Left lower leg: No edema.  Lymphadenopathy:     Cervical: No cervical adenopathy.  Skin:    General: Skin is warm and dry.     Findings: No rash.  Neurological:     Mental Status: She is alert. Mental status is at baseline.  Psychiatric:        Mood and Affect: Mood normal.        Behavior: Behavior normal.        Lab Results  Component Value Date   WBC 6.4 08/25/2022   HGB 11.7 (L) 08/25/2022   HCT 36.1 08/25/2022   PLT 202 08/25/2022   GLUCOSE 108 (H) 08/25/2022   CHOL 136 05/22/2022   TRIG 155.0 (H) 05/22/2022   HDL 37.00 (L) 05/22/2022   LDLDIRECT  64.0 04/22/2020   LDLCALC 68 05/22/2022   ALT 16 08/25/2022   AST 14 (L) 08/25/2022   NA 138 08/25/2022   K 4.3 08/25/2022   CL 104 08/25/2022   CREATININE 0.94 08/25/2022   BUN 19 08/25/2022   CO2 27 08/25/2022   TSH 0.04 (L) 05/22/2022   INR 0.9 10/28/2019   HGBA1C 7.2 (H) 05/22/2022   MICROALBUR 1.1 05/22/2022     Assessment & Plan:    See Problem List for Assessment and Plan of chronic medical problems.

## 2022-11-21 ENCOUNTER — Ambulatory Visit (INDEPENDENT_AMBULATORY_CARE_PROVIDER_SITE_OTHER): Payer: Medicare HMO | Admitting: Internal Medicine

## 2022-11-21 VITALS — BP 112/64 | HR 65 | Temp 98.0°F | Ht 65.0 in | Wt 147.0 lb

## 2022-11-21 DIAGNOSIS — E1165 Type 2 diabetes mellitus with hyperglycemia: Secondary | ICD-10-CM

## 2022-11-21 DIAGNOSIS — I700 Atherosclerosis of aorta: Secondary | ICD-10-CM | POA: Diagnosis not present

## 2022-11-21 DIAGNOSIS — Z23 Encounter for immunization: Secondary | ICD-10-CM | POA: Diagnosis not present

## 2022-11-21 DIAGNOSIS — J45901 Unspecified asthma with (acute) exacerbation: Secondary | ICD-10-CM | POA: Diagnosis not present

## 2022-11-21 DIAGNOSIS — E038 Other specified hypothyroidism: Secondary | ICD-10-CM

## 2022-11-21 DIAGNOSIS — J4520 Mild intermittent asthma, uncomplicated: Secondary | ICD-10-CM

## 2022-11-21 DIAGNOSIS — Z7984 Long term (current) use of oral hypoglycemic drugs: Secondary | ICD-10-CM

## 2022-11-21 DIAGNOSIS — E7849 Other hyperlipidemia: Secondary | ICD-10-CM

## 2022-11-21 LAB — COMPREHENSIVE METABOLIC PANEL
ALT: 14 U/L (ref 0–35)
AST: 15 U/L (ref 0–37)
Albumin: 3.7 g/dL (ref 3.5–5.2)
Alkaline Phosphatase: 111 U/L (ref 39–117)
BUN: 14 mg/dL (ref 6–23)
CO2: 29 meq/L (ref 19–32)
Calcium: 9.1 mg/dL (ref 8.4–10.5)
Chloride: 105 meq/L (ref 96–112)
Creatinine, Ser: 0.81 mg/dL (ref 0.40–1.20)
GFR: 69.97 mL/min (ref >60.00)
Glucose, Bld: 110 mg/dL — ABNORMAL HIGH (ref 70–99)
Potassium: 4.5 meq/L (ref 3.5–5.1)
Sodium: 140 meq/L (ref 135–145)
Total Bilirubin: 0.3 mg/dL (ref 0.2–1.2)
Total Protein: 6.7 g/dL (ref 6.0–8.3)

## 2022-11-21 LAB — LIPID PANEL
Cholesterol: 126 mg/dL (ref 0–200)
HDL: 48.5 mg/dL (ref >39.00)
LDL Cholesterol: 58 mg/dL (ref 0–99)
NonHDL: 77.71
Total CHOL/HDL Ratio: 3
Triglycerides: 100 mg/dL (ref 0.0–149.0)
VLDL: 20 mg/dL (ref 0.0–40.0)

## 2022-11-21 LAB — TSH: TSH: 0.11 u[IU]/mL — ABNORMAL LOW (ref 0.35–5.50)

## 2022-11-21 LAB — HEMOGLOBIN A1C: Hgb A1c MFr Bld: 6.2 % (ref 4.6–6.5)

## 2022-11-21 MED ORDER — DOXYCYCLINE HYCLATE 100 MG PO TABS: 100.0000 mg | ORAL_TABLET | Freq: Two times a day (BID) | ORAL | 0 refills | Status: AC

## 2022-11-21 MED ORDER — PREDNISONE 20 MG PO TABS: 20.0000 mg | ORAL_TABLET | Freq: Every day | ORAL | 0 refills | Status: AC

## 2022-11-21 NOTE — Assessment & Plan Note (Signed)
Chronic Regular exercise and healthy diet encouraged Check lipid panel, CMP Continue atorvastatin 10 mg daily 

## 2022-11-21 NOTE — Assessment & Plan Note (Addendum)
Chronic Not ideally controlled - ? Residual infection, mild exacerbation Continue Advair 500-50 mcg per ACT once -twice daily - sometimes forgets it in evening Continue albuterol as needed

## 2022-11-21 NOTE — Assessment & Plan Note (Signed)
Chronic Check lipid panel Continue atorvastatin 10 mg daily Encourage healthy diet and regular exercise

## 2022-11-21 NOTE — Assessment & Plan Note (Signed)
pT3 N1 M0, stage IIIB, lung, nodes and peritoneal metastasis 09/2019 -diagnosed 01/2014, s/p resection 02/03/14 showing 3 cm NET involving serosal tissue. Margins negative, but 5/25 positive nodes with extracapsular extension. -10/06/19 Dotatate PET showed metastatic disease to peritoneum, abdominal and mediastinal lymph nodes, and lungs. Indeterminate liver lesions. 10/29/19 peritoneal biopsy confirmed well-differentiated neuroendocrine tumor. -she took Sandostatin injections monthly 11/06/19 - 01/05/20, discontinued due to hyperglycemia, fatigue, and an episode of syncope and fall right after the injection. -for symptom management, she took one dose lanreotide on 02/24/21. This improved her diarrhea, but she developed vision issues in her right eye, and stopped -restaging CT CAP 02/08/2022 showed stable to slightly increased pulmonary nodules and peritoneal mets; otherwise no new signs of metastatic disease. Will continue to monitor given poor tolerance of prior treatment. -we discussed alternative therapy with oral Afinitor if she has disease progression. will continue observation.  Given her overall low disease burden, mild symptoms, will plan to continue observation. -She was seen in the emergency room on July 26, 2022 for chest pain, CT chest with contrast was obtained during her visit, which showed negative PE, stable lung nodules, no other acute findings.  I personally reviewed and discussed with patient. -Restaging CT 09/20/22 showed mild progression of pulmonary and peritoneal metastasis, stable RP nodes. Plan to continue monitoring

## 2022-11-21 NOTE — Assessment & Plan Note (Addendum)
subacute Mild asthma exacerbation-crackles and mild expiratory wheezing on exam - ? Mild bronchiectasis Prednisone 20 mg daily x 5 days Doxycycline 100 mg bid x 7 days Continue inhalers-stressed taking Advair twice a day and using the albuterol as needed

## 2022-11-21 NOTE — Assessment & Plan Note (Signed)
Chronic  Clinically euthyroid Check tsh and will titrate med dose if needed Currently taking levothyroxine 150 mcg daily 

## 2022-11-21 NOTE — Assessment & Plan Note (Signed)
Chronic   Lab Results  Component Value Date   HGBA1C 7.2 (H) 05/22/2022  Chronic Lab Results  Component Value Date   HGBA1C 7.2 (H) 05/22/2022   Sugars not ideally controlled-goal less than 7% Stopped metformin - felt better w/o it - no true side effects Check A1c Continue glimepiride 2 mg daily, Rybelsus 7 mg daily If A1c not at goal can increase Rybelsus Stressed regular exercise, diabetic diet

## 2022-11-22 ENCOUNTER — Encounter: Payer: Self-pay | Admitting: Hematology

## 2022-11-22 ENCOUNTER — Other Ambulatory Visit: Payer: Self-pay

## 2022-11-22 ENCOUNTER — Inpatient Hospital Stay: Payer: Medicare HMO | Admitting: Hematology

## 2022-11-22 ENCOUNTER — Inpatient Hospital Stay: Payer: Medicare HMO | Attending: Hematology

## 2022-11-22 VITALS — BP 117/57 | HR 90 | Temp 98.7°F | Resp 18 | Ht 65.0 in | Wt 148.1 lb

## 2022-11-22 DIAGNOSIS — D518 Other vitamin B12 deficiency anemias: Secondary | ICD-10-CM

## 2022-11-22 DIAGNOSIS — C7A8 Other malignant neuroendocrine tumors: Secondary | ICD-10-CM | POA: Insufficient documentation

## 2022-11-22 DIAGNOSIS — C7B8 Other secondary neuroendocrine tumors: Secondary | ICD-10-CM | POA: Insufficient documentation

## 2022-11-22 DIAGNOSIS — E538 Deficiency of other specified B group vitamins: Secondary | ICD-10-CM

## 2022-11-22 LAB — CMP (CANCER CENTER ONLY)
ALT: 25 U/L (ref 0–44)
AST: 32 U/L (ref 15–41)
Albumin: 3.8 g/dL (ref 3.5–5.0)
Alkaline Phosphatase: 134 U/L — ABNORMAL HIGH (ref 38–126)
Anion gap: 6 (ref 5–15)
BUN: 13 mg/dL (ref 8–23)
CO2: 28 mmol/L (ref 22–32)
Calcium: 9.2 mg/dL (ref 8.9–10.3)
Chloride: 103 mmol/L (ref 98–111)
Creatinine: 0.9 mg/dL (ref 0.44–1.00)
GFR, Estimated: >60 mL/min (ref >60)
Glucose, Bld: 120 mg/dL — ABNORMAL HIGH (ref 70–99)
Potassium: 3.9 mmol/L (ref 3.5–5.1)
Sodium: 137 mmol/L (ref 135–145)
Total Bilirubin: 0.9 mg/dL (ref 0.3–1.2)
Total Protein: 7 g/dL (ref 6.5–8.1)

## 2022-11-22 LAB — CBC WITH DIFFERENTIAL (CANCER CENTER ONLY)
Abs Immature Granulocytes: 0.03 10*3/uL (ref 0.00–0.07)
Basophils Absolute: 0.1 10*3/uL (ref 0.0–0.1)
Basophils Relative: 0 %
Eosinophils Absolute: 0.1 10*3/uL (ref 0.0–0.5)
Eosinophils Relative: 1 %
HCT: 37.2 % (ref 36.0–46.0)
Hemoglobin: 12.1 g/dL (ref 12.0–15.0)
Immature Granulocytes: 0 %
Lymphocytes Relative: 14 %
Lymphs Abs: 1.8 10*3/uL (ref 0.7–4.0)
MCH: 29 pg (ref 26.0–34.0)
MCHC: 32.5 g/dL (ref 30.0–36.0)
MCV: 89.2 fL (ref 80.0–100.0)
Monocytes Absolute: 0.7 10*3/uL (ref 0.1–1.0)
Monocytes Relative: 6 %
Neutro Abs: 9.9 10*3/uL — ABNORMAL HIGH (ref 1.7–7.7)
Neutrophils Relative %: 79 %
Platelet Count: 203 10*3/uL (ref 150–400)
RBC: 4.17 MIL/uL (ref 3.87–5.11)
RDW: 12.6 % (ref 11.5–15.5)
WBC Count: 12.5 10*3/uL — ABNORMAL HIGH (ref 4.0–10.5)
nRBC: 0 % (ref 0.0–0.2)

## 2022-11-22 NOTE — Progress Notes (Signed)
[facility]   [phone] Fax:([phone]   Clinic Follow up Note   Patient Care Team: Pincus Sanes, MD as PCP - General (Internal Medicine) Jodelle Red, MD as PCP - Cardiology (Cardiology) Malachy Mood, MD as Consulting Physician (Hematology) Avel Peace, MD as Consulting Physician (General Surgery) Szabat, Vinnie Level, [facility] (Inactive) as Pharmacist (Pharmacist) Elwin Mocha, MD as Consulting Physician (Ophthalmology)  Date of Service:  11/22/2022  CHIEF COMPLAINT: f/u of metastatic neuroendocrine tumor  CURRENT THERAPY:  Surveillance  Oncology History   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 (HCC) pT3 N1 M0, stage IIIB, lung, nodes and peritoneal metastasis 09/2019 -diagnosed 01/2014, s/p resection 02/03/14 showing 3 cm NET involving serosal tissue. Margins negative, but 5/25 positive nodes with extracapsular extension. -10/06/19 Dotatate PET showed metastatic disease to peritoneum, abdominal and mediastinal lymph nodes, and lungs. Indeterminate liver lesions. 10/29/19 peritoneal biopsy confirmed well-differentiated neuroendocrine tumor. -she took Sandostatin injections monthly 11/06/19 - 01/05/20, discontinued due to hyperglycemia, fatigue, and an episode of syncope and fall right after the injection. -for symptom management, she took one dose lanreotide on 02/24/21. This improved her diarrhea, but she developed vision issues in her right eye, and stopped -restaging CT CAP 02/08/2022 showed stable to slightly increased pulmonary nodules and peritoneal mets; otherwise no new signs of metastatic disease. Will continue to monitor given poor tolerance of prior treatment. -we discussed alternative therapy with oral Afinitor if she has disease progression. will continue observation.  Given her overall low disease burden, mild symptoms, will plan to continue observation. -She was seen in the emergency room on July 26, 2022 for  chest pain, CT chest with contrast was obtained during her visit, which showed negative PE, stable lung nodules, no other acute findings.  I personally reviewed and discussed with patient. -Restaging CT 09/20/22 showed mild progression of pulmonary and peritoneal metastasis, stable RP nodes. Plan to continue monitoring   Assessment and Plan    Metastatic Neuroendocrine Tumor Stable disease with slow progression over time. Patient has been off treatment due to intolerance to octreotide. Currently experiencing loose bowel movements, occasional constipation, and decreased appetite. -Continue monitoring without treatment at this time. -Repeat CT scan in three months (end of December 2024). -Collect 24-hour urine sample prior to next visit. -Encourage high protein, high carbohydrate diet to address weight loss.  Diabetes Improved control with Rybelsus, but potential contribution to weight loss. -Continue Rybelsus as prescribed. -Monitor weight and appetite.  Cough Increased coughing, possibly related to lung metastases or other respiratory issue. -Complete course of doxycycline as prescribed by Dr. Lawerance Bach. -Report if cough worsens or new symptoms develop.      PLAN -Lab reviewed, will continue monitoring -Follow-up in 3 months with lab and CT chest, abdomen pelvis with contrast a week before   SUMMARY OF ONCOLOGIC HISTORY: Oncology History Overview Note  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   Staging form: Colon and Rectum, AJCC 7th Edition     Clinical: T3, N2, M0 - Unsigned     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 (HCC)  01/21/2014 Imaging   CT: Distal small bowel obstruction due to 3.4 cm soft tissue mass near the ileocecal valve, with adjacent right lower quadrant mesenteric lymphadenopathy   02/03/2014 Pathologic Stage   invasive well diff neuroendocrine tumor (carcinoid), 3cm, pT3pN2 (5/25 nodes positive). negative margines.   02/03/2014  Surgery   Laparoscopic-assisted right colectomy and resection of distal ileum   02/03/2014 Initial Diagnosis   Metastatic  malignant neuroendocrine tumor of the ileocecal valve to regional lymph nodes.    05/31/2015 Imaging   CT A/P w contrast  IMPRESSION: 1. No evidence of metastatic disease. 2. Question mild basilar subpleural pulmonary fibrosis.   09/11/2019 Imaging   CT AP W contrast    IMPRESSION: 1. Redemonstrated postoperative findings of ileocolectomy and reanastomosis. 2. There are prominent lymph nodes in the right mesocolon which are slightly increased in size compared to prior examination, the largest node measuring 1.8 x 0.7 cm, previously 1.5 x 0.4 cm. Findings are nonspecific although concerning for nodal metastatic disease. 3. There are additional new small nodules of the transverse mesocolon or omentum measuring up to 9 mm, likewise concerning for nodal or omental metastatic disease. 4. Redemonstrated broad-based midline, fat containing ventral hernia components. 5. Fluid in the endometrial cavity, abnormal in the late postmenopausal setting although unchanged compared to prior examination. 6. Status post interval cholecystectomy. 7. Aortic Atherosclerosis (ICD10-I70.0).   10/06/2019 PET scan   IMPRESSION: 1. Several small nodules in the peritoneal space and retroperitoneal space with intense radiotracer activity consistent with metastatic well differentiated neuroendocrine tumor. 2. Several small liver lesions above background activity are indeterminate. Consider MRI liver with and without contrast. 3. Radiotracer activity within mediastinal lymph nodes and bilateral pulmonary nodules consistent with metastatic well-differentiated neuroendocrine tumor to the lungs. Activity is very low within these lesions but measurable. 4. No clear evidence skeletal metastasis. Single lesion in the posterior LEFT SI joint warrants attention on follow-up. 5. Although  there are multiple sites of metastatic disease, the overall tumor burden of metastatic disease very small.   10/28/2019 Relapse/Recurrence   FINAL MICROSCOPIC DIAGNOSIS:   A. PERITONEAL NODULES, BIOPSY:  -  Well-differentiated neuroendocrine tumor (carcinoid)  -  See comment   COMMENT:   By immunohistochemistry, the neoplastic cells are positive for CD56,  chromogranin and synaptophysin with a low proliferative rate by Ki-67  (less than 1%).  These findings are consistent with metastasis of the  patient's previously diagnosed small bowel carcinoid.  These findings  were discussed with Dr. Blake Divine on October 30, 2019.    11/06/2019 -  Chemotherapy   First-line Monthly Sandostatin injection starting 11/06/19. Held since 01/05/20 due to poor toleration ( hypoglycemia, fatigue, and the episode of syncope and fall right after the injection. The diarrhea and flushing has, but overall symptoms are mild)    05/03/2020 Imaging   CT CAP  IMPRESSION: 1. Stable exam. No new or progressive interval findings. 2. Scattered tiny lung nodules are stable since PET-CT of 10/06/2019. These were hypermetabolic on that exam consistent with metastatic disease. 3. Multiple small omental and mesenteric soft tissue nodules are similar to prior. These were also noted to be hypermetabolic on the PET study. 4. Status post ileocolectomy. 5. Left colonic diverticulosis without diverticulitis. 6. Aortic Atherosclerosis (ICD10-I70.0).     02/08/2021 Imaging   EXAM: CT CHEST, ABDOMEN, AND PELVIS WITH CONTRAST  IMPRESSION: 1. Three of the pulmonary nodules have intervally enlarged, suggesting mild progression of malignancy. The remaining pulmonary nodules in the omental nodules appear stable, and no definite new nodule is identified. 2. Other imaging findings of potential clinical significance: Aortic Atherosclerosis (ICD10-I70.0). Coronary atherosclerosis. Systemic atherosclerosis. Complex ventral  supraumbilical hernia with multiple defects and lobulations. Lumbar spondylosis and degenerative disc disease   07/28/2021 Imaging   EXAM: CT CHEST, ABDOMEN, AND PELVIS WITH CONTRAST  IMPRESSION: 1. Slight increased size of several pulmonary nodules, as detailed above, concerning for mild progression of metastatic disease  to the lungs. In addition, there are findings in the left lower lobe which could be of infectious or inflammatory etiology, although the possibility of developing lymphangitic spread of tumor in the left lower lobe is not excluded. Further clinical evaluation and attention on follow-up studies is recommended. 2. Multiple small soft tissue human plants in the peritoneal cavity appear very similar to the prior examination. No other new signs of metastatic disease noted elsewhere in the abdomen or pelvis. 3. Aortic atherosclerosis, in addition to left anterior descending coronary artery disease. Assessment for potential risk factor modification, dietary therapy or pharmacologic therapy may be warranted, if clinically indicated. 4. Colonic diverticulosis without evidence of acute diverticulitis at this time. 5. Additional incidental findings, similar to prior study, as above.      Discussed the use of AI scribe software for clinical note transcription with the patient, who gave verbal consent to proceed.  History of Present Illness   The patient, a 77 year old female with a history of metastatic neuroendocrine tumor, presents for a routine follow-up. She reports occasional constipation, which is unusual as she typically has loose bowel movements, occurring two to three times a day. She manages this with over-the-counter Imodium. She also reports a decrease in appetite, often feeling full after only a few bites of food. This has led to a weight loss from 156 lbs in August to 148 lbs at the current visit. She attributes this weight loss and decreased appetite to her diabetes  medication, Rybelsus. She also reports a persistent cough, for which she was prescribed doxycycline by another provider. She has been experiencing some discomfort when her bowel movements are not loose. She has a history of diabetes, which is currently well-controlled with a recent HbA1c of 6.2.         All other systems were reviewed with the patient and are negative.  MEDICAL HISTORY:  Past Medical History:  Diagnosis Date   Asthma    Cellulitis March  2014   Left foot and ankle   colon ca dx'd 01/2014   Colon polyps    2015    Diabetes mellitus (HCC) 05/05/2012   Type II   Hyperlipidemia    Morbid obesity (HCC)    Thyroid disease 1990's   HypoThyroidism    SURGICAL HISTORY: Past Surgical History:  Procedure Laterality Date   CHOLECYSTECTOMY N/A 05/02/2017   Procedure: LAPAROSCOPIC CHOLECYSTECTOMY WITH INTRAOPERATIVE CHOLANGIOGRAM, VENTRAL HERNIA REPAIR;  Surgeon: Griselda Miner, MD;  Location: [facility];  Service: General;  Laterality: N/A;   COLON RESECTION N/A 02/03/2014   Procedure: LAPAROSCOPIC ASSISTED BOWEL RESECTION;  Surgeon: Avel Peace, MD;  Location: [facility];  Service: General;  Laterality: N/A;   FRACTURE SURGERY  2000   Ankle   TOOTH EXTRACTION     wisdom Teeth    I have reviewed the social history and family history with the patient and they are unchanged from previous note.  ALLERGIES:  is allergic to penicillins.  MEDICATIONS:  Current Outpatient Medications  Medication Sig Dispense Refill   albuterol (VENTOLIN HFA) 108 (90 Base) MCG/ACT inhaler INHALE 2 PUFFS EVERY 6 HOURS AS NEEDED FOR WHEEZING OR SHORTNESS OF BREATH 54 g 3   atorvastatin (LIPITOR) 10 MG tablet TAKE 1 TABLET EVERY DAY 90 tablet 3   cetirizine (ZYRTEC) 10 MG tablet Take 1 tablet (10 mg total) by mouth daily. 15 tablet 0   doxycycline (VIBRA-TABS) 100 MG tablet Take 1 tablet (100 mg total) by mouth 2 (two) times daily for  7 days. 14 tablet 0   fluticasone-salmeterol (ADVAIR DISKUS)  500-50 MCG/ACT AEPB Inhale 1 puff into the lungs in the morning and at bedtime. 180 each 3   glimepiride (AMARYL) 2 MG tablet TAKE 1 TABLET BY MOUTH ONCE DAILY BEFORE BREAKFAST 30 tablet 0   glucose blood test strip Based on insurance preference. Use as instructed once daily to check sugars. Dx E11.9 100 each 12   Lancets (ONETOUCH ULTRASOFT) lancets Use as instructed once daily to check sugars. Dx E11.9 100 each 12   levothyroxine (SYNTHROID) 150 MCG tablet Take 1 tablet (150 mcg total) by mouth daily. Take 6 days a week only. 90 tablet 3   loperamide (IMODIUM) 1 MG/5ML solution Take by mouth as needed for diarrhea or loose stools.     montelukast (SINGULAIR) 10 MG tablet TAKE 1 TABLET AT BEDTIME 90 tablet 3   naproxen (NAPROSYN) 375 MG tablet Take 1 tablet (375 mg total) by mouth 2 (two) times daily. 20 tablet 0   predniSONE (DELTASONE) 20 MG tablet Take 1 tablet (20 mg total) by mouth daily with breakfast for 5 days. 5 tablet 0   Prenatal Vit-Fe Fumarate-FA (PRENATAL VITAMINS PO) Take by mouth.     Semaglutide (RYBELSUS) 7 MG TABS Take 1 tablet (7 mg total) by mouth daily. 90 tablet 1   No current facility-administered medications for this visit.    PHYSICAL EXAMINATION: ECOG PERFORMANCE STATUS: 0 - Asymptomatic  Vitals:   11/22/22 0837  BP: (!) 117/57  Pulse: 90  Resp: 18  Temp: 98.7 F (37.1 C)  SpO2: 97%   Wt Readings from Last 3 Encounters:  11/22/22 148 lb 1.6 oz (67.2 kg)  11/21/22 147 lb (66.7 kg)  10/03/22 155 lb 12.8 oz (70.7 kg)     GENERAL:alert, no distress and comfortable SKIN: skin color, texture, turgor are normal, no rashes or significant lesions EYES: normal, Conjunctiva are pink and non-injected, sclera clear NECK: supple, thyroid normal size, non-tender, without nodularity LYMPH:  no palpable lymphadenopathy in the cervical, axillary  LUNGS: clear to auscultation and percussion with normal breathing effort HEART: regular rate & rhythm and no murmurs and no  lower extremity edema ABDOMEN:abdomen soft, non-tender and normal bowel sounds Musculoskeletal:no cyanosis of digits and no clubbing  NEURO: alert & oriented x 3 with fluent speech, no focal motor/sensory deficits   LABORATORY DATA:  I have reviewed the data as listed    Latest Ref Rng & Units 11/22/2022    8:21 AM 08/25/2022    8:08 AM 07/27/2022   12:33 PM  CBC  WBC 4.0 - 10.5 K/uL 12.5  6.4  7.8   Hemoglobin 12.0 - 15.0 g/dL 29.5  62.1  30.8   Hematocrit 36.0 - 46.0 % 37.2  36.1  37.1   Platelets 150 - 400 K/uL 203  202  227         Latest Ref Rng & Units 11/22/2022    8:21 AM 11/21/2022    8:59 AM 08/25/2022    8:08 AM  CMP  Glucose 70 - 99 mg/dL 657  846  962   BUN 8 - 23 mg/dL 13  14  19    Creatinine 0.44 - 1.00 mg/dL 9.52  8.41  3.24   Sodium 135 - 145 mmol/L 137  140  138   Potassium 3.5 - 5.1 mmol/L 3.9  4.5  4.3   Chloride 98 - 111 mmol/L 103  105  104   CO2 22 - 32 mmol/L 28  29  27   Calcium 8.9 - 10.3 mg/dL 9.2  9.1  9.0   Total Protein 6.5 - 8.1 g/dL 7.0  6.7  6.1   Total Bilirubin 0.3 - 1.2 mg/dL 0.9  0.3  0.5   Alkaline Phos 38 - 126 U/L 134  111  125   AST 15 - 41 U/L 32  15  14   ALT 0 - 44 U/L 25  14  16        RADIOGRAPHIC STUDIES: I have personally reviewed the radiological images as listed and agreed with the findings in the report. No results found.    Orders Placed This Encounter  Procedures   CT CHEST ABDOMEN PELVIS W CONTRAST    Standing Status:   Future    Standing Expiration Date:   11/22/2023    Order Specific Question:   If indicated for the ordered procedure, I authorize the administration of contrast media per Radiology protocol    Answer:   Yes    Order Specific Question:   Does the patient have a contrast media/X-ray dye allergy?    Answer:   No    Order Specific Question:   Preferred imaging location?    Answer:   [facility]    Order Specific Question:   If indicated for the ordered procedure, I authorize the administration  of oral contrast media per Radiology protocol    Answer:   Yes   Chromogranin A    Standing Status:   Standing    Number of Occurrences:   20    Standing Expiration Date:   11/22/2023   All questions were answered. The patient knows to call the clinic with any problems, questions or concerns. No barriers to learning was detected. The total time spent in the appointment was 25 minutes.     Malachy Mood, MD 11/22/2022

## 2022-11-26 MED ORDER — LEVOTHYROXINE SODIUM 112 MCG PO TABS: 112.0000 ug | ORAL_TABLET | Freq: Every day | ORAL | 3 refills | Status: DC

## 2022-11-26 NOTE — Addendum Note (Signed)
Addended by: Pincus Sanes on: 11/26/2022 02:02 PM   Modules accepted: Orders

## 2022-12-08 ENCOUNTER — Other Ambulatory Visit: Payer: Self-pay | Admitting: Internal Medicine

## 2022-12-20 ENCOUNTER — Other Ambulatory Visit: Payer: Self-pay | Admitting: Internal Medicine

## 2023-01-08 ENCOUNTER — Other Ambulatory Visit: Payer: Self-pay | Admitting: Internal Medicine

## 2023-02-16 ENCOUNTER — Inpatient Hospital Stay: Payer: Medicare HMO | Attending: Hematology

## 2023-02-16 DIAGNOSIS — C7B8 Other secondary neuroendocrine tumors: Secondary | ICD-10-CM

## 2023-02-16 DIAGNOSIS — C7B04 Secondary carcinoid tumors of peritoneum: Secondary | ICD-10-CM | POA: Diagnosis not present

## 2023-02-16 DIAGNOSIS — C7B01 Secondary carcinoid tumors of distant lymph nodes: Secondary | ICD-10-CM | POA: Insufficient documentation

## 2023-02-16 DIAGNOSIS — C7A00 Malignant carcinoid tumor of unspecified site: Secondary | ICD-10-CM | POA: Insufficient documentation

## 2023-02-16 LAB — CBC WITH DIFFERENTIAL (CANCER CENTER ONLY)
Abs Immature Granulocytes: 0.01 10*3/uL (ref 0.00–0.07)
Basophils Absolute: 0.1 10*3/uL (ref 0.0–0.1)
Basophils Relative: 1 %
Eosinophils Absolute: 0.2 10*3/uL (ref 0.0–0.5)
Eosinophils Relative: 3 %
HCT: 36.5 % (ref 36.0–46.0)
Hemoglobin: 12 g/dL (ref 12.0–15.0)
Immature Granulocytes: 0 %
Lymphocytes Relative: 28 %
Lymphs Abs: 1.9 10*3/uL (ref 0.7–4.0)
MCH: 29.3 pg (ref 26.0–34.0)
MCHC: 32.9 g/dL (ref 30.0–36.0)
MCV: 89 fL (ref 80.0–100.0)
Monocytes Absolute: 0.5 10*3/uL (ref 0.1–1.0)
Monocytes Relative: 8 %
Neutro Abs: 4.1 10*3/uL (ref 1.7–7.7)
Neutrophils Relative %: 60 %
Platelet Count: 204 10*3/uL (ref 150–400)
RBC: 4.1 MIL/uL (ref 3.87–5.11)
RDW: 12.9 % (ref 11.5–15.5)
WBC Count: 6.7 10*3/uL (ref 4.0–10.5)
nRBC: 0 % (ref 0.0–0.2)

## 2023-02-16 LAB — CMP (CANCER CENTER ONLY)
ALT: 14 U/L (ref 0–44)
AST: 14 U/L — ABNORMAL LOW (ref 15–41)
Albumin: 3.7 g/dL (ref 3.5–5.0)
Alkaline Phosphatase: 107 U/L (ref 38–126)
Anion gap: 6 (ref 5–15)
BUN: 13 mg/dL (ref 8–23)
CO2: 28 mmol/L (ref 22–32)
Calcium: 9 mg/dL (ref 8.9–10.3)
Chloride: 110 mmol/L (ref 98–111)
Creatinine: 0.8 mg/dL (ref 0.44–1.00)
GFR, Estimated: >60 mL/min (ref >60)
Glucose, Bld: 116 mg/dL — ABNORMAL HIGH (ref 70–99)
Potassium: 4.3 mmol/L (ref 3.5–5.1)
Sodium: 144 mmol/L (ref 135–145)
Total Bilirubin: 0.4 mg/dL (ref <1.2)
Total Protein: 6.7 g/dL (ref 6.5–8.1)

## 2023-02-19 ENCOUNTER — Ambulatory Visit (HOSPITAL_COMMUNITY)
Admission: RE | Admit: 2023-02-19 | Discharge: 2023-02-19 | Disposition: A | Discharge status: Home / Self Care | Payer: Medicare HMO | Source: Ambulatory Visit | Attending: Hematology | Admitting: Hematology

## 2023-02-19 ENCOUNTER — Encounter (HOSPITAL_COMMUNITY): Payer: Self-pay

## 2023-02-19 DIAGNOSIS — C7B8 Other secondary neuroendocrine tumors: Secondary | ICD-10-CM | POA: Insufficient documentation

## 2023-02-19 DIAGNOSIS — I2510 Atherosclerotic heart disease of native coronary artery without angina pectoris: Secondary | ICD-10-CM | POA: Diagnosis not present

## 2023-02-19 DIAGNOSIS — K5730 Diverticulosis of large intestine without perforation or abscess without bleeding: Secondary | ICD-10-CM | POA: Diagnosis not present

## 2023-02-19 DIAGNOSIS — I700 Atherosclerosis of aorta: Secondary | ICD-10-CM | POA: Insufficient documentation

## 2023-02-19 DIAGNOSIS — C7802 Secondary malignant neoplasm of left lung: Secondary | ICD-10-CM | POA: Diagnosis not present

## 2023-02-19 DIAGNOSIS — J90 Pleural effusion, not elsewhere classified: Secondary | ICD-10-CM | POA: Insufficient documentation

## 2023-02-19 DIAGNOSIS — C7A1 Malignant poorly differentiated neuroendocrine tumors: Secondary | ICD-10-CM | POA: Diagnosis not present

## 2023-02-19 LAB — CHROMOGRANIN A: Chromogranin A (ng/mL): 296.2 ng/mL — ABNORMAL HIGH (ref 0.0–101.8)

## 2023-02-19 MED ORDER — IOHEXOL 300 MG/ML  SOLN
30.0000 mL | Freq: Once | INTRAMUSCULAR | Status: AC | PRN
  Administered 2023-02-19: 30 mL via ORAL

## 2023-02-19 MED ORDER — IOHEXOL 300 MG/ML  SOLN
100.0000 mL | Freq: Once | INTRAMUSCULAR | Status: AC | PRN
  Administered 2023-02-19: 100 mL via INTRAVENOUS

## 2023-02-21 ENCOUNTER — Other Ambulatory Visit: Payer: Self-pay | Admitting: Internal Medicine

## 2023-02-21 DIAGNOSIS — E1165 Type 2 diabetes mellitus with hyperglycemia: Secondary | ICD-10-CM

## 2023-02-21 NOTE — Assessment & Plan Note (Signed)
pT3 N1 M0, stage IIIB, lung, nodes and peritoneal metastasis 09/2019 -diagnosed 01/2014, s/p resection 02/03/14 showing 3 cm NET involving serosal tissue. Margins negative, but 5/25 positive nodes with extracapsular extension. -10/06/19 Dotatate PET showed metastatic disease to peritoneum, abdominal and mediastinal lymph nodes, and lungs. Indeterminate liver lesions. 10/29/19 peritoneal biopsy confirmed well-differentiated neuroendocrine tumor. -she took Sandostatin injections monthly 11/06/19 - 01/05/20, discontinued due to hyperglycemia, fatigue, and an episode of syncope and fall right after the injection. -for symptom management, she took one dose lanreotide on 02/24/21. This improved her diarrhea, but she developed vision issues in her right eye, and stopped -restaging CT CAP 02/08/2022 showed stable to slightly increased pulmonary nodules and peritoneal mets; otherwise no new signs of metastatic disease. Will continue to monitor given poor tolerance of prior treatment. -we discussed alternative therapy with oral Afinitor if she has disease progression. will continue observation.  Given her overall low disease burden, mild symptoms, will plan to continue observation. -She was seen in the emergency room on July 26, 2022 for chest pain, CT chest with contrast was obtained during her visit, which showed negative PE, stable lung nodules, no other acute findings.  I personally reviewed and discussed with patient. -Restaging CT 09/20/22 showed mild progression of pulmonary and peritoneal metastasis, stable RP nodes. Plan to continue monitoring

## 2023-02-22 ENCOUNTER — Inpatient Hospital Stay: Payer: Medicare HMO | Attending: Hematology | Admitting: Hematology

## 2023-02-22 ENCOUNTER — Encounter: Payer: Self-pay | Admitting: Hematology

## 2023-02-22 VITALS — BP 133/68 | HR 81 | Temp 98.0°F | Resp 15 | Wt 152.1 lb

## 2023-02-22 DIAGNOSIS — C7A00 Malignant carcinoid tumor of unspecified site: Secondary | ICD-10-CM | POA: Insufficient documentation

## 2023-02-22 DIAGNOSIS — C7B04 Secondary carcinoid tumors of peritoneum: Secondary | ICD-10-CM | POA: Insufficient documentation

## 2023-02-22 DIAGNOSIS — C7B8 Other secondary neuroendocrine tumors: Secondary | ICD-10-CM | POA: Diagnosis not present

## 2023-02-22 NOTE — Progress Notes (Addendum)
 [facility]   [phone] [phone]   Clinic Follow up Note   Patient Care Team: Geofm Glade PARAS, MD as PCP - General (Internal Medicine) Lonni Slain, MD as PCP - Cardiology (Cardiology) Lanny Callander, MD as Consulting Physician (Hematology) Lily Boas, MD as Consulting Physician (General Surgery) Szabat, Toribio BROCKS, [facility] (Inactive) as Pharmacist (Pharmacist) Maree Lonni Inks, MD as Consulting Physician (Ophthalmology)  Date of Service:  02/22/2023  CHIEF COMPLAINT: f/u of metastatic neuroendocrine tumor  CURRENT THERAPY:  Observation  Oncology History   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 (HCC) pT3 N1 M0, stage IIIB, lung, nodes and peritoneal metastasis 09/2019 -diagnosed 01/2014, s/p resection 02/03/14 showing 3 cm NET involving serosal tissue. Margins negative, but 5/25 positive nodes with extracapsular extension. -10/06/19 Dotatate PET showed metastatic disease to peritoneum, abdominal and mediastinal lymph nodes, and lungs. Indeterminate liver lesions. 10/29/19 peritoneal biopsy confirmed well-differentiated neuroendocrine tumor. -she took Sandostatin  injections monthly 11/06/19 - 01/05/20, discontinued due to hyperglycemia, fatigue, and an episode of syncope and fall right after the injection. -for symptom management, she took one dose lanreotide on 02/24/21. This improved her diarrhea, but she developed vision issues in her right eye, and stopped -restaging CT CAP 02/08/2022 showed stable to slightly increased pulmonary nodules and peritoneal mets; otherwise no new signs of metastatic disease. Will continue to monitor given poor tolerance of prior treatment. -we discussed alternative therapy with oral Afinitor if she has disease progression. will continue observation.  Given her overall low disease burden, mild symptoms, will plan to continue observation. -She was seen in the emergency room on July 26, 2022 for chest  pain, CT chest with contrast was obtained during her visit, which showed negative PE, stable lung nodules, no other acute findings.  I personally reviewed and discussed with patient. -Restaging CT 09/20/22 showed mild progression of pulmonary and peritoneal metastasis, stable RP nodes. Plan to continue monitoring      Assessment and Plan    Metastatic Neuroendocrine Tumor Presents for follow-up. Recent CT shows disease progression with enlarging pulmonary nodules, increased lymph nodes, and enlarged omentum and peritoneal nodules. Reports persistent cough with white phlegm. Tumor marker levels increased from 150-190 to 296, correlating with CT findings. Previously tried injections without success. Discussed Lutathera  treatment, an intravenous nuclear medicine, with potential side effects including nausea, vomiting, and low blood counts. Most patients tolerate it well with good disease control but not a cure. Alternative treatment is Afinitor, an oral medication with manageable side effects but less effective. Decision pending insurance approval and consultation with Dr. Malva. - Refer to Dr. Malva for consultation regarding Lutathera  treatment - Monitor blood counts during treatment - Consider Afinitor as an alternative if Lutathera  is not feasible - Order repeat DOTA-TATE scan if recommended by Dr. Malva - Follow up in six weeks to review treatment plan and progress  General Health Maintenance Maintaining weight with no significant changes in general health. Experienced chills recently but no fever recorded. - Continue monitoring general health and report any new symptoms  Plan -Lab and CT scan reviewed, which unfortunately showed disease progression -I will refer her to radiology Dr. Malva to consider lutathera  treatment - Follow up with me in six weeks.         SUMMARY OF ONCOLOGIC HISTORY: Oncology History Overview Note  Metastatic malignant neuroendocrine tumor to lymph  node   Staging form: Colon and Rectum, AJCC 7th Edition     Clinical: T3, N2, M0 - Unsigned  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 (HCC)  01/21/2014 Imaging   CT: Distal small bowel obstruction due to 3.4 cm soft tissue mass near the ileocecal valve, with adjacent right lower quadrant mesenteric lymphadenopathy   02/03/2014 Pathologic Stage   invasive well diff neuroendocrine tumor (carcinoid), 3cm, pT3pN2 (5/25 nodes positive). negative margines.   02/03/2014 Surgery   Laparoscopic-assisted right colectomy and resection of distal ileum   02/03/2014 Initial Diagnosis   Metastatic malignant neuroendocrine tumor of the ileocecal valve to regional lymph nodes.    05/31/2015 Imaging   CT A/P w contrast  IMPRESSION: 1. No evidence of metastatic disease. 2. Question mild basilar subpleural pulmonary fibrosis.   09/11/2019 Imaging   CT AP W contrast    IMPRESSION: 1. Redemonstrated postoperative findings of ileocolectomy and reanastomosis. 2. There are prominent lymph nodes in the right mesocolon which are slightly increased in size compared to prior examination, the largest node measuring 1.8 x 0.7 cm, previously 1.5 x 0.4 cm. Findings are nonspecific although concerning for nodal metastatic disease. 3. There are additional new small nodules of the transverse mesocolon or omentum measuring up to 9 mm, likewise concerning for nodal or omental metastatic disease. 4. Redemonstrated broad-based midline, fat containing ventral hernia components. 5. Fluid in the endometrial cavity, abnormal in the late postmenopausal setting although unchanged compared to prior examination. 6. Status post interval cholecystectomy. 7. Aortic Atherosclerosis (ICD10-I70.0).   10/06/2019 PET scan   IMPRESSION: 1. Several small nodules in the peritoneal space and retroperitoneal space with intense radiotracer activity consistent with metastatic well differentiated neuroendocrine  tumor. 2. Several small liver lesions above background activity are indeterminate. Consider MRI liver with and without contrast. 3. Radiotracer activity within mediastinal lymph nodes and bilateral pulmonary nodules consistent with metastatic well-differentiated neuroendocrine tumor to the lungs. Activity is very low within these lesions but measurable. 4. No clear evidence skeletal metastasis. Single lesion in the posterior LEFT SI joint warrants attention on follow-up. 5. Although there are multiple sites of metastatic disease, the overall tumor burden of metastatic disease very small.   10/28/2019 Relapse/Recurrence   FINAL MICROSCOPIC DIAGNOSIS:   A. PERITONEAL NODULES, BIOPSY:  -  Well-differentiated neuroendocrine tumor (carcinoid)  -  See comment   COMMENT:   By immunohistochemistry, the neoplastic cells are positive for CD56,  chromogranin and synaptophysin with a low proliferative rate by Ki-67  (less than 1%).  These findings are consistent with metastasis of the  patient's previously diagnosed small bowel carcinoid.  These findings  were discussed with Dr. Wilmer on October 30, 2019.    11/06/2019 -  Chemotherapy   First-line Monthly Sandostatin  injection starting 11/06/19. Held since 01/05/20 due to poor toleration ( hypoglycemia, fatigue, and the episode of syncope and fall right after the injection. The diarrhea and flushing has, but overall symptoms are mild)    05/03/2020 Imaging   CT CAP  IMPRESSION: 1. Stable exam. No new or progressive interval findings. 2. Scattered tiny lung nodules are stable since PET-CT of 10/06/2019. These were hypermetabolic on that exam consistent with metastatic disease. 3. Multiple small omental and mesenteric soft tissue nodules are similar to prior. These were also noted to be hypermetabolic on the PET study. 4. Status post ileocolectomy. 5. Left colonic diverticulosis without diverticulitis. 6. Aortic Atherosclerosis  (ICD10-I70.0).     02/08/2021 Imaging   EXAM: CT CHEST, ABDOMEN, AND PELVIS WITH CONTRAST  IMPRESSION: 1. Three of the pulmonary nodules have intervally enlarged, suggesting mild progression of malignancy. The remaining pulmonary  nodules in the omental nodules appear stable, and no definite new nodule is identified. 2. Other imaging findings of potential clinical significance: Aortic Atherosclerosis (ICD10-I70.0). Coronary atherosclerosis. Systemic atherosclerosis. Complex ventral supraumbilical hernia with multiple defects and lobulations. Lumbar spondylosis and degenerative disc disease   07/28/2021 Imaging   EXAM: CT CHEST, ABDOMEN, AND PELVIS WITH CONTRAST  IMPRESSION: 1. Slight increased size of several pulmonary nodules, as detailed above, concerning for mild progression of metastatic disease to the lungs. In addition, there are findings in the left lower lobe which could be of infectious or inflammatory etiology, although the possibility of developing lymphangitic spread of tumor in the left lower lobe is not excluded. Further clinical evaluation and attention on follow-up studies is recommended. 2. Multiple small soft tissue human plants in the peritoneal cavity appear very similar to the prior examination. No other new signs of metastatic disease noted elsewhere in the abdomen or pelvis. 3. Aortic atherosclerosis, in addition to left anterior descending coronary artery disease. Assessment for potential risk factor modification, dietary therapy or pharmacologic therapy may be warranted, if clinically indicated. 4. Colonic diverticulosis without evidence of acute diverticulitis at this time. 5. Additional incidental findings, similar to prior study, as above.      Discussed the use of AI scribe software for clinical note transcription with the patient, who gave verbal consent to proceed.  History of Present Illness   Jessica Farrell, a 78 year old patient with a known  diagnosis of metastatic neuroendocrine tumor, presents for a routine follow-up. The patient reports no significant changes in her health status since the last visit. 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  Tenelle experiences occasional abdominal discomfort, particularly after eating, which she describes as 'normal' for her. She reports frequent bowel movements, averaging three times a day, which are sometimes loose or diarrheal in nature. There is no reported blood in the stool.  The patient also reports a persistent cough, which she describes as productive with white phlegm. She denies any chest discomfort associated with the cough.  Recently, Avelina experienced an episode of chills and feeling cold, which she attributes to a possible viral infection. She denies any fever during this episode and reports feeling better at the time of the visit.  Sullivan also mentions a past adverse reaction to an injection treatment for her condition, which resulted in her passing out in the parking lot and experiencing changes in her eyesight.         All other systems were reviewed with the patient and are negative.  MEDICAL HISTORY:  Past Medical History:  Diagnosis Date   Asthma    Cellulitis March  2014   Left foot and ankle   colon ca dx'd 01/2014   Colon polyps    2015    Diabetes mellitus (HCC) 05/05/2012   Type II   Hyperlipidemia    Morbid obesity (HCC)    Thyroid  disease 1990's   HypoThyroidism    SURGICAL HISTORY: Past Surgical History:  Procedure Laterality Date   CHOLECYSTECTOMY N/A 05/02/2017   Procedure: LAPAROSCOPIC CHOLECYSTECTOMY WITH INTRAOPERATIVE CHOLANGIOGRAM, VENTRAL HERNIA REPAIR;  Surgeon: Curvin Deward MOULD, MD;  Location: [facility];  Service: General;  Laterality: N/A;   COLON RESECTION N/A 02/03/2014   Procedure: LAPAROSCOPIC ASSISTED BOWEL RESECTION;  Surgeon: Krystal Russell, MD;  Location: [facility];  Service: General;   Laterality: N/A;   FRACTURE SURGERY  2000   Ankle   TOOTH EXTRACTION     wisdom Teeth  I have reviewed the social history and family history with the patient and they are unchanged from previous note.  ALLERGIES:  is allergic to penicillins.  MEDICATIONS:  Current Outpatient Medications  Medication Sig Dispense Refill   albuterol  (VENTOLIN  HFA) 108 (90 Base) MCG/ACT inhaler INHALE 2 PUFFS EVERY 6 HOURS AS NEEDED FOR WHEEZING OR SHORTNESS OF BREATH 54 g 3   atorvastatin  (LIPITOR) 10 MG tablet TAKE 1 TABLET EVERY DAY 90 tablet 3   cetirizine  (ZYRTEC ) 10 MG tablet Take 1 tablet (10 mg total) by mouth daily. 15 tablet 0   fluticasone -salmeterol (ADVAIR  DISKUS) 500-50 MCG/ACT AEPB Inhale 1 puff into the lungs in the morning and at bedtime. 180 each 3   glimepiride  (AMARYL ) 2 MG tablet TAKE 1 TABLET BY MOUTH ONCE DAILY BEFORE BREAKFAST 30 tablet 0   glucose blood test strip Based on insurance preference. Use as instructed once daily to check sugars. Dx E11.9 100 each 12   Lancets (ONETOUCH ULTRASOFT) lancets Use as instructed once daily to check sugars. Dx E11.9 100 each 12   levothyroxine  (SYNTHROID ) 112 MCG tablet Take 1 tablet (112 mcg total) by mouth daily. 90 tablet 3   loperamide (IMODIUM) 1 MG/5ML solution Take by mouth as needed for diarrhea or loose stools.     metFORMIN  (GLUCOPHAGE -XR) 750 MG 24 hr tablet TAKE 2 TABLETS EVERY DAY WITH SUPPER 180 tablet 3   montelukast  (SINGULAIR ) 10 MG tablet TAKE 1 TABLET AT BEDTIME 90 tablet 3   naproxen  (NAPROSYN ) 375 MG tablet Take 1 tablet (375 mg total) by mouth 2 (two) times daily. 20 tablet 0   Prenatal Vit-Fe Fumarate-FA (PRENATAL VITAMINS PO) Take by mouth.     Semaglutide  (RYBELSUS ) 7 MG TABS Take 1 tablet by mouth once daily 30 tablet 5   No current facility-administered medications for this visit.    PHYSICAL EXAMINATION: ECOG PERFORMANCE STATUS: 1 - Symptomatic but completely ambulatory  Vitals:   02/22/23 0827  BP: 133/68   Pulse: 81  Resp: 15  Temp: 98 F (36.7 C)  SpO2: 96%   Wt Readings from Last 3 Encounters:  02/22/23 152 lb 1.6 oz (69 kg)  11/22/22 148 lb 1.6 oz (67.2 kg)  11/21/22 147 lb (66.7 kg)     GENERAL:alert, no distress and comfortable SKIN: skin color, texture, turgor are normal, no rashes or significant lesions EYES: normal, Conjunctiva are pink and non-injected, sclera clear NECK: supple, thyroid  normal size, non-tender, without nodularity LYMPH:  no palpable lymphadenopathy in the cervical, axillary  LUNGS: clear to auscultation and percussion with normal breathing effort HEART: regular rate & rhythm and no murmurs and no lower extremity edema ABDOMEN:abdomen soft, non-tender and normal bowel sounds Musculoskeletal:no cyanosis of digits and no clubbing  NEURO: alert & oriented x 3 with fluent speech, no focal motor/sensory deficits  LABORATORY DATA:  I have reviewed the data as listed    Latest Ref Rng & Units 02/16/2023    8:04 AM 11/22/2022    8:21 AM 08/25/2022    8:08 AM  CBC  WBC 4.0 - 10.5 K/uL 6.7  12.5  6.4   Hemoglobin 12.0 - 15.0 g/dL 87.9  87.8  88.2   Hematocrit 36.0 - 46.0 % 36.5  37.2  36.1   Platelets 150 - 400 K/uL 204  203  202         Latest Ref Rng & Units 02/16/2023    8:04 AM 11/22/2022    8:21 AM 11/21/2022    8:59 AM  CMP  Glucose 70 - 99 mg/dL 883  879  889   BUN 8 - 23 mg/dL 13  13  14    Creatinine 0.44 - 1.00 mg/dL 9.19  9.09  9.18   Sodium 135 - 145 mmol/L 144  137  140   Potassium 3.5 - 5.1 mmol/L 4.3  3.9  4.5   Chloride 98 - 111 mmol/L 110  103  105   CO2 22 - 32 mmol/L 28  28  29    Calcium  8.9 - 10.3 mg/dL 9.0  9.2  9.1   Total Protein 6.5 - 8.1 g/dL 6.7  7.0  6.7   Total Bilirubin <1.2 mg/dL 0.4  0.9  0.3   Alkaline Phos 38 - 126 U/L 107  134  111   AST 15 - 41 U/L 14  32  15   ALT 0 - 44 U/L 14  25  14        RADIOGRAPHIC STUDIES: I have personally reviewed the radiological images as listed and agreed with the findings in the  report. No results found.    No orders of the defined types were placed in this encounter.  All questions were answered. The patient knows to call the clinic with any problems, questions or concerns. No barriers to learning was detected. The total time spent in the appointment was 40 minutes.     Onita Mattock, MD 02/22/2023

## 2023-03-10 ENCOUNTER — Other Ambulatory Visit: Payer: Self-pay

## 2023-03-10 ENCOUNTER — Ambulatory Visit
Admission: EM | Admit: 2023-03-10 | Discharge: 2023-03-10 | Disposition: A | Discharge status: Home / Self Care | Payer: Medicare HMO

## 2023-03-10 ENCOUNTER — Encounter: Payer: Self-pay | Admitting: Emergency Medicine

## 2023-03-10 ENCOUNTER — Ambulatory Visit (INDEPENDENT_AMBULATORY_CARE_PROVIDER_SITE_OTHER): Payer: Medicare HMO

## 2023-03-10 DIAGNOSIS — J45901 Unspecified asthma with (acute) exacerbation: Secondary | ICD-10-CM | POA: Diagnosis not present

## 2023-03-10 DIAGNOSIS — R918 Other nonspecific abnormal finding of lung field: Secondary | ICD-10-CM | POA: Diagnosis not present

## 2023-03-10 DIAGNOSIS — J329 Chronic sinusitis, unspecified: Secondary | ICD-10-CM | POA: Diagnosis not present

## 2023-03-10 DIAGNOSIS — R0602 Shortness of breath: Secondary | ICD-10-CM | POA: Diagnosis not present

## 2023-03-10 DIAGNOSIS — J40 Bronchitis, not specified as acute or chronic: Secondary | ICD-10-CM | POA: Diagnosis not present

## 2023-03-10 DIAGNOSIS — R059 Cough, unspecified: Secondary | ICD-10-CM | POA: Diagnosis not present

## 2023-03-10 MED ORDER — DOXYCYCLINE HYCLATE 100 MG PO CAPS: 100.0000 mg | ORAL_CAPSULE | Freq: Two times a day (BID) | ORAL | 0 refills | Status: DC

## 2023-03-10 MED ORDER — PREDNISONE 10 MG PO TABS: 30.0000 mg | ORAL_TABLET | Freq: Every day | ORAL | 0 refills | Status: AC

## 2023-03-10 MED ORDER — IPRATROPIUM-ALBUTEROL 0.5-2.5 (3) MG/3ML IN SOLN
3.0000 mL | Freq: Once | RESPIRATORY_TRACT | Status: AC
  Administered 2023-03-10: 3 mL via RESPIRATORY_TRACT

## 2023-03-10 MED ORDER — METHYLPREDNISOLONE ACETATE 80 MG/ML IJ SUSP
40.0000 mg | Freq: Once | INTRAMUSCULAR | Status: AC
  Administered 2023-03-10: 40 mg via INTRAMUSCULAR

## 2023-03-10 MED ORDER — BENZONATATE 100 MG PO CAPS: 100.0000 mg | ORAL_CAPSULE | Freq: Three times a day (TID) | ORAL | 0 refills | Status: DC

## 2023-03-10 NOTE — ED Triage Notes (Signed)
Pt sts cough and feeling bad x 2 weeks; pt sts cough worsening and pain with cough

## 2023-03-10 NOTE — Discharge Instructions (Addendum)
Your x-ray did not show any evidence of pneumonia but did show inflammation in your airways consistent with bronchitis/asthma exacerbation.  Use your albuterol on a scheduled basis for the next 3 days (every 4-6 hours).  We gave an injection of steroids today so start prednisone 30 mg daily for 5 days tomorrow (03/10/2022).  Do not take NSAIDs with this medication including aspirin, ibuprofen/Advil, naproxen/Aleve.  Take doxycycline 100 mg twice daily for 10 days.  Stay out of the sun while on this medication.  Use over-the-counter medication including Mucinex, Tylenol, Flonase, nasal saline/sinus rinses.  Make sure that you are resting and drinking plenty of fluid.  Follow-up with your primary care next week.  If anything worsens you need to go to the emergency room including chest pain, shortness of breath, high fever, worsening cough, weakness.

## 2023-03-10 NOTE — ED Provider Notes (Signed)
[name]    CSN: [id number] Arrival date & time: 03/10/23  1338      History   Chief Complaint Chief Complaint  Patient presents with   Cough    HPI Jessica Farrell is a 78 y.o. female.   Patient presents today with a 2-week history of cough, shortness of breath, wheezing, fatigue.  She does have some congestion but this is minimal.  She has not had any fever, nausea, vomiting.  She denies any known sick contacts but does work at [employer] so is exposed to many people.  She has had COVID several years ago.  She has had COVID vaccines but not most recent booster.  She does have a history of asthma and has been using her albuterol and Advair with improvement but not resolution of symptoms.  Denies hospitalization related to asthma.  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  She is not currently receiving any treatment.  She denies any recent antibiotics or steroids.  She does have diabetes but her last A1c was appropriate at 6.2% on 11/21/2022.  She has been taking multiple over-the-counter medications including Mucinex and Robitussin without improvement of symptoms.    Past Medical History:  Diagnosis Date   Asthma    Cellulitis March  2014   Left foot and ankle   colon ca dx'd 01/2014   Colon polyps    2015    Diabetes mellitus (HCC) 05/05/2012   Type II   Hyperlipidemia    Morbid obesity (HCC)    Thyroid disease 1990's   HypoThyroidism    Patient Active Problem List   Diagnosis Date Noted   Community acquired pneumonia 03/29/2022   Asthma exacerbation, mild 03/29/2022   B12 deficiency anemia 02/02/2022   Aortic atherosclerosis (HCC) 04/22/2020   Tingling in extremities 05/23/2018   Osteopenia 03/27/2018   Umbilical hernia without obstruction or gangrene 11/21/2017   Onychomycosis 05/22/2017   Carcinoid tumor of cecum 01/26/2017   Cough 06/06/2016   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 (HCC)  02/18/2014   Chronic venous insufficiency 08/02/2012   HLD (hyperlipidemia) 05/15/2012   Diabetes mellitus (HCC) 05/05/2012   Hypothyroidism 05/05/2012   Asthma 05/05/2012    Past Surgical History:  Procedure Laterality Date   CHOLECYSTECTOMY N/A 05/02/2017   Procedure: LAPAROSCOPIC CHOLECYSTECTOMY WITH INTRAOPERATIVE CHOLANGIOGRAM, VENTRAL HERNIA REPAIR;  Surgeon: Griselda Miner, MD;  Location: [facility];  Service: General;  Laterality: N/A;   COLON RESECTION N/A 02/03/2014   Procedure: LAPAROSCOPIC ASSISTED BOWEL RESECTION;  Surgeon: Avel Peace, MD;  Location: [facility];  Service: General;  Laterality: N/A;   FRACTURE SURGERY  2000   Ankle   TOOTH EXTRACTION     wisdom Teeth    OB History   No obstetric history on file.      Home Medications    Prior to Admission medications   Medication Sig Start Date End Date Taking? Authorizing Provider  benzonatate (TESSALON) 100 MG capsule Take 1 capsule (100 mg total) by mouth every 8 (eight) hours. 03/10/23  Yes Nyoka Alcoser K, PA-C  doxycycline (VIBRAMYCIN) 100 MG capsule Take 1 capsule (100 mg total) by mouth 2 (two) times daily. 03/10/23  Yes Kaylanni Ezelle, Noberto Retort, PA-C  levothyroxine (SYNTHROID) 150 MCG tablet Take 150 mcg by mouth daily before breakfast. 01/18/23  Yes [provider]  predniSONE (DELTASONE) 10 MG tablet Take 3 tablets (30 mg total) by mouth daily for 5 days. 03/10/23 03/15/23 Yes Christ Fullenwider,  Luvina Poirier K, PA-C  albuterol (VENTOLIN HFA) 108 (90 Base) MCG/ACT inhaler INHALE 2 PUFFS EVERY 6 HOURS AS NEEDED FOR WHEEZING OR SHORTNESS OF BREATH 07/28/22   Pincus Sanes, MD  atorvastatin (LIPITOR) 10 MG tablet TAKE 1 TABLET EVERY DAY 02/22/23   Pincus Sanes, MD  cetirizine (ZYRTEC) 10 MG tablet Take 1 tablet (10 mg total) by mouth daily. 01/18/20   Derwood Kaplan, MD  fluticasone-salmeterol (ADVAIR DISKUS) 500-50 MCG/ACT AEPB Inhale 1 puff into the lungs in the morning and at bedtime. 07/31/22   Pincus Sanes, MD  glimepiride (AMARYL)  2 MG tablet TAKE 1 TABLET BY MOUTH ONCE DAILY BEFORE BREAKFAST 11/08/21   Pincus Sanes, MD  glucose blood test strip Based on insurance preference. Use as instructed once daily to check sugars. Dx E11.9 06/04/18   Pincus Sanes, MD  Lancets [facility] ULTRASOFT) lancets Use as instructed once daily to check sugars. Dx E11.9 05/23/18   Pincus Sanes, MD  levothyroxine (SYNTHROID) 112 MCG tablet Take 1 tablet (112 mcg total) by mouth daily. 11/26/22   Pincus Sanes, MD  loperamide (IMODIUM) 1 MG/5ML solution Take by mouth as needed for diarrhea or loose stools.    [provider]  metFORMIN (GLUCOPHAGE-XR) 750 MG 24 hr tablet TAKE 2 TABLETS EVERY DAY WITH SUPPER 12/20/22   Burns, Bobette Mo, MD  montelukast (SINGULAIR) 10 MG tablet TAKE 1 TABLET AT BEDTIME 02/22/23   Pincus Sanes, MD  naproxen (NAPROSYN) 375 MG tablet Take 1 tablet (375 mg total) by mouth 2 (two) times daily. 01/18/20   Derwood Kaplan, MD  Prenatal Vit-Fe Fumarate-FA (PRENATAL VITAMINS PO) Take by mouth.    [provider]  Semaglutide (RYBELSUS) 7 MG TABS Take 1 tablet by mouth once daily 01/08/23   Pincus Sanes, MD    Family History Family History  Problem Relation Age of Onset   Alzheimer's disease Father    Heart disease Mother    Arthritis Mother    Hypertension Mother    Alzheimer's disease Sister    Cancer Sister 74       breast cancer    Heart disease Brother        Heart Disease before age 32   Breast cancer Sister    Colon cancer Neg Hx    Esophageal cancer Neg Hx    Rectal cancer Neg Hx    Stomach cancer Neg Hx     Social History Social History   Tobacco Use   Smoking status: Never   Smokeless tobacco: Never  Vaping Use   Vaping status: Never Used  Substance Use Topics   Alcohol use: No   Drug use: No     Allergies   Penicillins   Review of Systems Review of Systems  Constitutional:  Positive for activity change. Negative for appetite change, fatigue and fever.  HENT:   Positive for congestion. Negative for sinus pressure, sneezing and sore throat.   Respiratory:  Positive for cough, chest tightness, shortness of breath and wheezing.   Cardiovascular:  Negative for chest pain.  Gastrointestinal:  Negative for abdominal pain, diarrhea, nausea and vomiting.  Neurological:  Negative for dizziness, light-headedness and headaches.     Physical Exam Triage Vital Signs ED Triage Vitals  Encounter Vitals Group     BP 03/10/23 1346 118/76     Systolic BP Percentile --      Diastolic BP Percentile --      Pulse Rate 03/10/23 1346  98     Resp 03/10/23 1346 18     Temp 03/10/23 1346 98.1 F (36.7 C)     Temp Source 03/10/23 1346 Oral     SpO2 03/10/23 1346 94 %     Weight --      Height --      Head Circumference --      Peak Flow --      Pain Score 03/10/23 1347 3     Pain Loc --      Pain Education --      Exclude from Growth Chart --    No data found.  Updated Vital Signs BP 118/76 (BP Location: Right Arm)   Pulse 90   Temp 98.1 F (36.7 C) (Oral)   Resp 18   SpO2 97%   Visual Acuity Right Eye Distance:   Left Eye Distance:   Bilateral Distance:    Right Eye Near:   Left Eye Near:    Bilateral Near:     Physical Exam Vitals reviewed.  Constitutional:      General: She is awake. She is not in acute distress.    Appearance: Normal appearance. She is well-developed. She is not ill-appearing.     Comments: Very pleasant female appears stated age in no acute distress sitting comfortably in exam room  HENT:     Head: Normocephalic and atraumatic.     Right Ear: Tympanic membrane, ear canal and external ear normal. Tympanic membrane is not erythematous or bulging.     Left Ear: Tympanic membrane, ear canal and external ear normal. Tympanic membrane is not erythematous or bulging.     Nose:     Right Sinus: No maxillary sinus tenderness or frontal sinus tenderness.     Left Sinus: No maxillary sinus tenderness or frontal sinus tenderness.      Mouth/Throat:     Pharynx: Uvula midline. No oropharyngeal exudate or posterior oropharyngeal erythema.  Cardiovascular:     Rate and Rhythm: Normal rate and regular rhythm.     Heart sounds: Normal heart sounds, S1 normal and S2 normal. No murmur heard. Pulmonary:     Effort: Pulmonary effort is normal.     Breath sounds: Wheezing and rhonchi present. No rales.     Comments: Widespread wheezing and rhonchi throughout lung fields Psychiatric:        Behavior: Behavior is cooperative.      UC Treatments / Results  Labs (all labs ordered are listed, but only abnormal results are displayed) Labs Reviewed - No data to display  EKG   Radiology DG Chest 2 View Result Date: 03/10/2023 CLINICAL DATA:  Worsening cough and shortness of breath. EXAM: CHEST - 2 VIEW COMPARISON:  CT 02/19/2023 FINDINGS: Heart size and mediastinal contours appear normal. No pleural fluid. Diffuse bronchial wall thickening. Persistent opacities within the posterior left lung base corresponding to recently characterized progressive metastatic disease. Medial right lower lobe pulmonary nodule and lateral left lower lobe pulmonary nodules are again noted, also compatible with known metastases. No new findings. IMPRESSION: 1. Persistent opacities within the posterior left lung base corresponding to recently characterized progressive metastatic disease. 2. Diffuse bronchial wall thickening compatible with bronchitis. Electronically Signed   By: Signa Kell M.D.   On: 03/10/2023 15:05    Procedures Procedures (including critical care time)  Medications Ordered in UC Medications  ipratropium-albuterol (DUONEB) 0.5-2.5 (3) MG/3ML nebulizer solution 3 mL (3 mLs Nebulization Given 03/10/23 1424)  methylPREDNISolone acetate (DEPO-MEDROL) injection 40 mg (  40 mg Intramuscular Given 03/10/23 1424)    Initial Impression / Assessment and Plan / UC Course  I have reviewed the triage vital signs and the nursing  notes.  Pertinent labs & imaging results that were available during my care of the patient were reviewed by me and considered in my medical decision making (see chart for details).     Patient is well-appearing, afebrile, nontoxic, nontachycardic.  Viral testing was deferred as she has been symptomatic for several weeks and this would not change management.  Chest x-ray was obtained that showed persistent opacities consistent with previous imaging for metastatic disease as well as bronchial wall thickening but no focal consolidation.  She was given DuoNeb and Depo-Medrol in clinic with improvement of symptoms.  Concern for sinobronchitis given prolonged and worsening symptoms that has triggered her asthma.  She was encouraged to continue Advair as previously prescribed and use her albuterol on a scheduled basis for the next several days.  Will start doxycycline 100 mg twice daily for 10 days and we discussed that she is to avoid prolonged sun exposure while on this medication.  She was given Depo-Medrol in clinic and so we will start prednisone burst of 30 mg for 5 days tomorrow (03/11/2023).  Discussed that she is not to take NSAIDs with this medication but can use over-the-counter medicine such as Mucinex, Tylenol, Flonase.  She is to rest and drink plenty of fluid.  She was given Tessalon for cough.  Recommend close follow-up with her primary care first thing next week.  Discussed that if anything worsens or changes and she has chest pain, increasing shortness of breath, fever, nausea, vomiting she needs to be seen immediately.  Strict return precautions given.  Excuse note provided.  Final Clinical Impressions(s) / UC Diagnoses   Final diagnoses:  Asthma exacerbation, mild  Sinobronchitis     Discharge Instructions      Your x-ray did not show any evidence of pneumonia but did show inflammation in your airways consistent with bronchitis/asthma exacerbation.  Use your albuterol on a scheduled  basis for the next 3 days (every 4-6 hours).  We gave an injection of steroids today so start prednisone 30 mg daily for 5 days tomorrow (03/10/2022).  Do not take NSAIDs with this medication including aspirin, ibuprofen/Advil, naproxen/Aleve.  Take doxycycline 100 mg twice daily for 10 days.  Stay out of the sun while on this medication.  Use over-the-counter medication including Mucinex, Tylenol, Flonase, nasal saline/sinus rinses.  Make sure that you are resting and drinking plenty of fluid.  Follow-up with your primary care next week.  If anything worsens you need to go to the emergency room including chest pain, shortness of breath, high fever, worsening cough, weakness.     ED Prescriptions     Medication Sig Dispense Auth. Provider   doxycycline (VIBRAMYCIN) 100 MG capsule Take 1 capsule (100 mg total) by mouth 2 (two) times daily. 20 capsule [place] K, PA-C   predniSONE (DELTASONE) 10 MG tablet Take 3 tablets (30 mg total) by mouth daily for 5 days. 15 tablet Azadeh Hyder K, PA-C   benzonatate (TESSALON) 100 MG capsule Take 1 capsule (100 mg total) by mouth every 8 (eight) hours. 21 capsule Hamza Empson K, PA-C      PDMP not reviewed this encounter.   Jeani Hawking, PA-C 03/10/23 1528

## 2023-04-01 ENCOUNTER — Other Ambulatory Visit: Payer: Self-pay | Admitting: Internal Medicine

## 2023-04-09 ENCOUNTER — Other Ambulatory Visit: Payer: Self-pay | Admitting: *Deleted

## 2023-04-09 ENCOUNTER — Telehealth: Payer: Self-pay | Admitting: Hematology

## 2023-04-09 NOTE — Telephone Encounter (Signed)
 Patient is aware of rescheduled appointment times/dates per staff message on 04/09/2023

## 2023-04-09 NOTE — Assessment & Plan Note (Deleted)
pT3 N1 M0, stage IIIB, lung, nodes and peritoneal metastasis 09/2019 -diagnosed 01/2014, s/p resection 02/03/14 showing 3 cm NET involving serosal tissue. Margins negative, but 5/25 positive nodes with extracapsular extension. -10/06/19 Dotatate PET showed metastatic disease to peritoneum, abdominal and mediastinal lymph nodes, and lungs. Indeterminate liver lesions. 10/29/19 peritoneal biopsy confirmed well-differentiated neuroendocrine tumor. -she took Sandostatin injections monthly 11/06/19 - 01/05/20, discontinued due to hyperglycemia, fatigue, and an episode of syncope and fall right after the injection. -for symptom management, she took one dose lanreotide on 02/24/21. This improved her diarrhea, but she developed vision issues in her right eye, and stopped -restaging CT CAP 02/08/2022 showed stable to slightly increased pulmonary nodules and peritoneal mets; otherwise no new signs of metastatic disease. Will continue to monitor given poor tolerance of prior treatment. -we discussed alternative therapy with oral Afinitor if she has disease progression. will continue observation.  Given her overall low disease burden, mild symptoms, will plan to continue observation. -She was seen in the emergency room on July 26, 2022 for chest pain, CT chest with contrast was obtained during her visit, which showed negative PE, stable lung nodules, no other acute findings.  I personally reviewed and discussed with patient. -Restaging CT 09/20/22 showed mild progression of pulmonary and peritoneal metastasis, stable RP nodes. Plan to continue monitoring

## 2023-04-10 ENCOUNTER — Inpatient Hospital Stay: Payer: Medicare HMO

## 2023-04-10 ENCOUNTER — Other Ambulatory Visit (HOSPITAL_COMMUNITY): Payer: Self-pay | Admitting: Hematology

## 2023-04-10 ENCOUNTER — Inpatient Hospital Stay: Payer: Medicare HMO | Admitting: Hematology

## 2023-04-10 DIAGNOSIS — C7B8 Other secondary neuroendocrine tumors: Secondary | ICD-10-CM

## 2023-04-19 ENCOUNTER — Other Ambulatory Visit (HOSPITAL_COMMUNITY): Payer: Self-pay | Admitting: Hematology

## 2023-04-19 ENCOUNTER — Ambulatory Visit (HOSPITAL_COMMUNITY)
Admission: RE | Admit: 2023-04-19 | Discharge: 2023-04-19 | Disposition: A | Discharge status: Home / Self Care | Payer: Medicare HMO | Source: Ambulatory Visit | Attending: Hematology | Admitting: Hematology

## 2023-04-19 ENCOUNTER — Encounter (HOSPITAL_COMMUNITY)
Admission: RE | Admit: 2023-04-19 | Discharge: 2023-04-19 | Disposition: A | Discharge status: Home / Self Care | Payer: Medicare HMO | Source: Ambulatory Visit | Attending: Hematology | Admitting: Hematology

## 2023-04-19 DIAGNOSIS — C7B8 Other secondary neuroendocrine tumors: Secondary | ICD-10-CM

## 2023-04-19 DIAGNOSIS — C799 Secondary malignant neoplasm of unspecified site: Secondary | ICD-10-CM | POA: Diagnosis not present

## 2023-04-19 DIAGNOSIS — C7A8 Other malignant neuroendocrine tumors: Secondary | ICD-10-CM | POA: Diagnosis not present

## 2023-04-19 DIAGNOSIS — R918 Other nonspecific abnormal finding of lung field: Secondary | ICD-10-CM | POA: Diagnosis not present

## 2023-04-19 MED ORDER — COPPER CU 64 DOTATATE 1 MCI/ML IV SOLN
4.0000 | Freq: Once | INTRAVENOUS | Status: AC
  Administered 2023-04-19: 4.1 via INTRAVENOUS

## 2023-04-19 NOTE — Consult Note (Signed)
 Chief Complaint: Patient with metastatic neuroendocrine tumor .   Evaluation Peptide receptor radiotherapy (PRRT) with Lu177 DOTATATE (Lutathera).  Referring Physician(s):Yeng     Patient Status: [facility], [place] - Out-pt  History of Present Illness: Jessica Farrell is a 78 y.o. female with metastatic NET  Copied below from [facility] Oncology note - ------ 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 (HCC) pT3 N1 M0, stage IIIB, lung, nodes and peritoneal metastasis 09/2019 -diagnosed 01/2014, s/p resection 02/03/14 showing 3 cm NET involving serosal tissue. Margins negative, but 5/25 positive nodes with extracapsular extension. -10/06/19 Dotatate PET showed metastatic disease to peritoneum, abdominal and mediastinal lymph nodes, and lungs. Indeterminate liver lesions. 10/29/19 peritoneal biopsy confirmed well-differentiated neuroendocrine tumor. -she took Sandostatin injections monthly 11/06/19 - 01/05/20, discontinued due to hyperglycemia, fatigue, and an episode of syncope and fall right after the injection. -for symptom management, she took one dose lanreotide on 02/24/21. This improved her diarrhea, but she developed vision issues in her right eye, and stopped -restaging CT CAP 02/08/2022 showed stable to slightly increased pulmonary nodules and peritoneal mets; otherwise no new signs of metastatic disease. Will continue to monitor given poor tolerance of prior treatment. -we discussed alternative therapy with oral Afinitor if she has disease progression. will continue observation.  Given her overall low disease burden, mild symptoms, will plan to continue observation. -She was seen in the emergency room on July 26, 2022 for chest pain, CT chest with contrast was obtained during her visit, which showed negative PE, stable lung nodules, no other acute findings.  I personally reviewed and discussed with patient. -Restaging CT 09/20/22 showed mild progression of pulmonary and peritoneal metastasis,  stable RP nodes. Plan to continue monitoring ----- End copy -   DOTATATE PET scan 04/19/2023 demonstrates clear progression well differentiated neuroendocrine tumor with new lung metastasis, liver metastasis, and skeletal metastasis.   Chromogranin A increased to  292 from 173 one  year prior.    Past Medical History:  Diagnosis Date   Asthma    Cellulitis March  2014   Left foot and ankle   colon ca dx'd 01/2014   Colon polyps    2015    Diabetes mellitus (HCC) 05/05/2012   Type II   Hyperlipidemia    Morbid obesity (HCC)    Thyroid disease 1990's   HypoThyroidism    Past Surgical History:  Procedure Laterality Date   CHOLECYSTECTOMY N/A 05/02/2017   Procedure: LAPAROSCOPIC CHOLECYSTECTOMY WITH INTRAOPERATIVE CHOLANGIOGRAM, VENTRAL HERNIA REPAIR;  Surgeon: Griselda Miner, MD;  Location: [facility] ORS;  Service: General;  Laterality: N/A;   COLON RESECTION N/A 02/03/2014   Procedure: LAPAROSCOPIC ASSISTED BOWEL RESECTION;  Surgeon: Avel Peace, MD;  Location: [facility] ORS;  Service: General;  Laterality: N/A;   FRACTURE SURGERY  2000   Ankle   TOOTH EXTRACTION     wisdom Teeth    Allergies: Penicillins  Medications: Prior to Admission medications   Medication Sig Start Date End Date Taking? Authorizing Provider  albuterol (VENTOLIN HFA) 108 (90 Base) MCG/ACT inhaler INHALE 2 PUFFS EVERY 6 HOURS AS NEEDED FOR WHEEZING OR SHORTNESS OF BREATH 07/28/22   Pincus Sanes, MD  atorvastatin (LIPITOR) 10 MG tablet TAKE 1 TABLET EVERY DAY 02/22/23   Burns, Bobette Mo, MD  benzonatate (TESSALON) 100 MG capsule Take 1 capsule (100 mg total) by mouth every 8 (eight) hours. 03/10/23   Raspet, Noberto Retort, PA-C  cetirizine (ZYRTEC) 10 MG tablet Take 1 tablet (10 mg total) by mouth daily. 01/18/20  Derwood Kaplan, MD  doxycycline (VIBRAMYCIN) 100 MG capsule Take 1 capsule (100 mg total) by mouth 2 (two) times daily. 03/10/23   Raspet, Noberto Retort, PA-C  fluticasone-salmeterol (ADVAIR DISKUS) 500-50 MCG/ACT AEPB  Inhale 1 puff into the lungs in the morning and at bedtime. 07/31/22   Pincus Sanes, MD  glimepiride (AMARYL) 2 MG tablet TAKE 1 TABLET BY MOUTH ONCE DAILY BEFORE BREAKFAST 11/08/21   Pincus Sanes, MD  glucose blood test strip Based on insurance preference. Use as instructed once daily to check sugars. Dx E11.9 06/04/18   Pincus Sanes, MD  Lancets [facility] ULTRASOFT) lancets Use as instructed once daily to check sugars. Dx E11.9 05/23/18   Pincus Sanes, MD  levothyroxine (SYNTHROID) 112 MCG tablet Take 1 tablet (112 mcg total) by mouth daily. 11/26/22   Pincus Sanes, MD  levothyroxine (SYNTHROID) 150 MCG tablet Take 150 mcg by mouth daily before breakfast. 01/18/23   [provider]  loperamide (IMODIUM) 1 MG/5ML solution Take by mouth as needed for diarrhea or loose stools.    [provider]  metFORMIN (GLUCOPHAGE-XR) 750 MG 24 hr tablet TAKE 2 TABLETS EVERY DAY WITH SUPPER 12/20/22   Burns, Bobette Mo, MD  montelukast (SINGULAIR) 10 MG tablet TAKE 1 TABLET AT BEDTIME 02/22/23   Pincus Sanes, MD  naproxen (NAPROSYN) 375 MG tablet Take 1 tablet (375 mg total) by mouth 2 (two) times daily. 01/18/20   Derwood Kaplan, MD  Prenatal Vit-Fe Fumarate-FA (PRENATAL VITAMINS PO) Take by mouth.    [provider]  Semaglutide (RYBELSUS) 7 MG TABS Take 1 tablet by mouth once daily 01/08/23   Pincus Sanes, MD     Family History  Problem Relation Age of Onset   Alzheimer's disease Father    Heart disease Mother    Arthritis Mother    Hypertension Mother    Alzheimer's disease Sister    Cancer Sister 45       breast cancer    Heart disease Brother        Heart Disease before age 102   Breast cancer Sister    Colon cancer Neg Hx    Esophageal cancer Neg Hx    Rectal cancer Neg Hx    Stomach cancer Neg Hx     Social History   Socioeconomic History   Marital status: Divorced    Spouse name: Not on file   Number of children: Not on file   Years of education: Not on  file   Highest education level: Some college, no degree  Occupational History   Not on file  Tobacco Use   Smoking status: Never   Smokeless tobacco: Never  Vaping Use   Vaping status: Never Used  Substance and Sexual Activity   Alcohol use: No   Drug use: No   Sexual activity: Not Currently    Birth control/protection: Post-menopausal  Other Topics Concern   Not on file  Social History Narrative   Not on file   Social Drivers of Health   Financial Resource Strain: Medium Risk (11/20/2022)   Overall Financial Resource Strain (CARDIA)    Difficulty of Paying Living Expenses: Somewhat hard  Food Insecurity: Food Insecurity Present (11/20/2022)   Hunger Vital Sign    Worried About Running Out of Food in the Last Year: Sometimes true    Ran Out of Food in the Last Year: Sometimes true  Transportation Needs: No Transportation Needs (11/20/2022)   PRAPARE - Transportation  Lack of Transportation (Medical): No    Lack of Transportation (Non-Medical): No  Physical Activity: Unknown (11/20/2022)   Exercise Vital Sign    Days of Exercise per Week: 5 days    Minutes of Exercise per Session: Patient declined  Stress: No Stress Concern Present (11/20/2022)   [employer] of Occupational Health - Occupational Stress Questionnaire    Feeling of Stress : Not at all  Social Connections: Moderately Integrated (11/20/2022)   Social Connection and Isolation Panel [NHANES]    Frequency of Communication with Friends and Family: More than three times a week    Frequency of Social Gatherings with Friends and Family: Once a week    Attends Religious Services: More than 4 times per year    Active Member of [employer] or Organizations: Yes    Attends Banker Meetings: Patient declined    Marital Status: Divorced    ECOG Status: 1 - Symptomatic but completely ambulatory  Review of Systems: A 12 point ROS discussed and pertinent positives are indicated in the HPI above.  All other  systems are negative.  Review of Systems  Chronic diarrhea. No flushing. Patient works at [employer]   Vital Signs: There were no vitals taken for this visit.  Physical Exam  Well nourished fit 78 year old female.   Imaging: NM PET DOTATATE SKULL BASE TO MID THIGH Result Date: 04/19/2023 CLINICAL DATA:  One different tumor. Concern for disease progression. Increase in chromogranin A. increased disease by CT imaging 01/23/2023 EXAM: NUCLEAR MEDICINE PET SKULL BASE TO THIGH TECHNIQUE: 4.1 mCi copper 64 DOTATATE was injected intravenously. Full-ring PET imaging was performed from the skull base to thigh after the radiotracer. CT data was obtained and used for attenuation correction and anatomic localization. COMPARISON:  CT scan 02/19/2023, DOTATATE PET scan 10/06/2019 is FINDINGS: NECK No radiotracer activity in neck lymph nodes. Incidental CT findings: None CHEST Interval increase in size and radiotracer activity of pulmonary nodules. Example new small nodule in the LEFT upper lobe on image 34 with SUV max equal 3.1. increased in size and radiotracer activity of RIGHT upper lobe nodule measuring 11 mm increased from 5 mm with SUV max equal 11.5 increased from SUV max equal 3.1. LEFT lower lobe nodule measuring 14 mm increased from 2 mm on prior with SUV max equal 18.0. New radiotracer avid LEFT axillary lymph node with SUV max equal 20.1 on image 37. Incidental CT finding:None ABDOMEN/PELVIS New radiotracer avid lesion in the superior RIGHT hepatic lobe with SUV max equal 16.1 on image 68. Progression of peritoneal implants.  Examples: New peritoneal implant within a ventral abdominal hernia of with SUV max equal 12.2 on image 111. Marked increase in size peritoneal implant in the posterior cul-de-sac measuring 17 mm with SUV max equal 32 (image 144. Physiologic activity noted in the liver, spleen, adrenal glands and kidneys. Incidental CT findings:None SKELETON New radiotracer avid skeletal metastasis  in the posterior LEFT iliac bone with SUV max equal 30 (image 127) new metastatic lesion in the C3 vertebral body (image 201) Incidental CT findings:None IMPRESSION: 1. Progression of somatostatin receptor positive metastatic neuroendocrine tumor. 2. Increase in size and radiotracer activity of pulmonary nodules. 3. New radiotracer avid RIGHT axillary lymph node. 4. New radiotracer avid lesion in the superior RIGHT hepatic lobe. 5. Progression of peritoneal metastatic implants. 6. New radiotracer avid skeletal metastasis in the LEFT iliac bone and C3 vertebral body. Electronically Signed   By: Genevive Bi M.D.   On:  04/19/2023 10:40    Labs:  CBC: Recent Labs    07/27/22 1233 08/25/22 0808 11/22/22 0821 02/16/23 0804  WBC 7.8 6.4 12.5* 6.7  HGB 12.3 11.7* 12.1 12.0  HCT 37.1 36.1 37.2 36.5  PLT 227 202 203 204    COAGS: No results for input(s): "INR", "APTT" in the last 8760 hours.  BMP: Recent Labs    07/27/22 1233 08/25/22 0808 11/21/22 0859 11/22/22 0821 02/16/23 0804  NA 137 138 140 137 144  K 3.7 4.3 4.5 3.9 4.3  CL 103 104 105 103 110  CO2 24 27 29 28 28   GLUCOSE 94 108* 110* 120* 116*  BUN 10 19 14 13 13   CALCIUM 8.6* 9.0 9.1 9.2 9.0  CREATININE 0.86 0.94 0.81 0.90 0.80  GFRNONAA >60 >60  --  >60 >60    LIVER FUNCTION TESTS: Recent Labs    08/25/22 0808 11/21/22 0859 11/22/22 0821 02/16/23 0804  BILITOT 0.5 0.3 0.9 0.4  AST 14* 15 32 14*  ALT 16 14 25 14   ALKPHOS 125 111 134* 107  PROT 6.1* 6.7 7.0 6.7  ALBUMIN 3.5 3.7 3.8 3.7    TUMOR MARKERS: Recent Labs    05/26/22 1307 08/25/22 0808 02/16/23 0804  CHROMOGA 180.5* 198.5* 296.2*    Assessment and Plan:  [Patient is a good candidate for peptide receptor radiotherapy.  Patient has well differentiated neuroendocrine tumor identified within progession in the lungs, liver, pertineal space and skeleton.  The tumor is positive for DOTATATE/ somatostatin receptors.  Patient has symptoms which  may be attributable to carcinoid tumor including diarrhea.   The patient was counseled on the primary goal of therapy which is prolongation of progression free survival (79% improvement over standard therapy).  Secondary goals would include decrease in tumor burden and decrease in carcinoid symptoms.    Primary of toxicities of therapy were explained to patient including marrow suppression, renal toxicity and hepatic toxicity.  Rare toxicity of myelosuppression and leukemia also explained.  Potential toxicity will be monitored throughout the course of therapy with interval  CBC and CMP laboratory evaluation.    Four therapies will be scheduled  2 months apart over a  six-month interval.  Patient will receive IM Sandostatin injection in the molecular imaging department after each therapy.  Patient will return to oncology clinic 1 month following each therapy for CBC and CMP and IM Sandostatin injection.   Thank you for this interesting consult.  I greatly enjoyed meeting Jessica Farrell and look forward to participating in their care.  A copy of this report was sent to the requesting provider on this date.  Electronically Signed: Patriciaann Clan, MD 04/19/2023, 11:40 AM   I spent a total of  30 Minutes   in face to face in clinical consultation, greater than 50% of which was counseling/coordinating care for metastatic neuroendocrine tumor.

## 2023-04-23 ENCOUNTER — Telehealth: Payer: Self-pay

## 2023-04-23 NOTE — Telephone Encounter (Signed)
 Attempted to contact patient via telephone call, per Dr. Mosetta Putt. Unable to reach patient.  LVM to return my call. Awaiting call back from patient.

## 2023-04-24 ENCOUNTER — Other Ambulatory Visit: Payer: Self-pay

## 2023-04-24 ENCOUNTER — Telehealth: Payer: Self-pay

## 2023-04-24 NOTE — Telephone Encounter (Signed)
 Received a call back from the patient as requested.  let pt know that the PET scan confirmed disease progression and she is a candidate for Lutathera therapy which she is scheduled to start.  Patient verbalized understanding.

## 2023-04-25 ENCOUNTER — Telehealth: Payer: Self-pay | Admitting: Hematology

## 2023-04-26 ENCOUNTER — Inpatient Hospital Stay: Attending: Hematology

## 2023-04-26 DIAGNOSIS — C7B09 Secondary carcinoid tumors of other sites: Secondary | ICD-10-CM | POA: Diagnosis not present

## 2023-04-26 DIAGNOSIS — C7B8 Other secondary neuroendocrine tumors: Secondary | ICD-10-CM

## 2023-04-26 DIAGNOSIS — C7A019 Malignant carcinoid tumor of the small intestine, unspecified portion: Secondary | ICD-10-CM | POA: Insufficient documentation

## 2023-04-26 LAB — CBC WITH DIFFERENTIAL (CANCER CENTER ONLY)
Abs Immature Granulocytes: 0.02 10*3/uL (ref 0.00–0.07)
Basophils Absolute: 0.1 10*3/uL (ref 0.0–0.1)
Basophils Relative: 1 %
Eosinophils Absolute: 0.2 10*3/uL (ref 0.0–0.5)
Eosinophils Relative: 3 %
HCT: 36.4 % (ref 36.0–46.0)
Hemoglobin: 11.7 g/dL — ABNORMAL LOW (ref 12.0–15.0)
Immature Granulocytes: 0 %
Lymphocytes Relative: 29 %
Lymphs Abs: 2.1 10*3/uL (ref 0.7–4.0)
MCH: 28.3 pg (ref 26.0–34.0)
MCHC: 32.1 g/dL (ref 30.0–36.0)
MCV: 87.9 fL (ref 80.0–100.0)
Monocytes Absolute: 0.6 10*3/uL (ref 0.1–1.0)
Monocytes Relative: 9 %
Neutro Abs: 4.1 10*3/uL (ref 1.7–7.7)
Neutrophils Relative %: 58 %
Platelet Count: 207 10*3/uL (ref 150–400)
RBC: 4.14 MIL/uL (ref 3.87–5.11)
RDW: 13 % (ref 11.5–15.5)
WBC Count: 7 10*3/uL (ref 4.0–10.5)
nRBC: 0 % (ref 0.0–0.2)

## 2023-04-26 LAB — CMP (CANCER CENTER ONLY)
ALT: 15 U/L (ref 0–44)
AST: 15 U/L (ref 15–41)
Albumin: 3.7 g/dL (ref 3.5–5.0)
Alkaline Phosphatase: 106 U/L (ref 38–126)
Anion gap: 5 (ref 5–15)
BUN: 11 mg/dL (ref 8–23)
CO2: 30 mmol/L (ref 22–32)
Calcium: 8.8 mg/dL — ABNORMAL LOW (ref 8.9–10.3)
Chloride: 106 mmol/L (ref 98–111)
Creatinine: 0.79 mg/dL (ref 0.44–1.00)
GFR, Estimated: >60 mL/min (ref >60)
Glucose, Bld: 129 mg/dL — ABNORMAL HIGH (ref 70–99)
Potassium: 4.3 mmol/L (ref 3.5–5.1)
Sodium: 141 mmol/L (ref 135–145)
Total Bilirubin: 0.3 mg/dL (ref 0.0–1.2)
Total Protein: 6.6 g/dL (ref 6.5–8.1)

## 2023-04-27 LAB — CHROMOGRANIN A: Chromogranin A (ng/mL): 329.5 ng/mL — ABNORMAL HIGH (ref 0.0–101.8)

## 2023-04-30 ENCOUNTER — Other Ambulatory Visit: Payer: Self-pay

## 2023-04-30 DIAGNOSIS — C7A019 Malignant carcinoid tumor of the small intestine, unspecified portion: Secondary | ICD-10-CM | POA: Diagnosis not present

## 2023-04-30 DIAGNOSIS — C7B8 Other secondary neuroendocrine tumors: Secondary | ICD-10-CM

## 2023-04-30 DIAGNOSIS — C7B09 Secondary carcinoid tumors of other sites: Secondary | ICD-10-CM | POA: Diagnosis not present

## 2023-05-02 MED ORDER — SODIUM CHLORIDE 0.9 % IV SOLN: 500.0000 mL | Freq: Once | INTRAVENOUS | Status: DC

## 2023-05-02 MED ORDER — LUTETIUM LU 177 DOTATATE 370 MBQ/ML IV SOLN: 200.0000 | Freq: Once | INTRAVENOUS | Status: DC

## 2023-05-02 MED ORDER — ONDANSETRON HCL 8 MG PO TABS: 8.0000 mg | ORAL_TABLET | Freq: Two times a day (BID) | ORAL | 0 refills | Status: DC | PRN

## 2023-05-02 MED ORDER — OCTREOTIDE ACETATE 500 MCG/ML IJ SOLN: 500.0000 ug | Freq: Once | INTRAMUSCULAR | Status: DC | PRN

## 2023-05-02 MED ORDER — ONDANSETRON 8 MG/NS 50 ML IVPB
8.0000 mg | Freq: Once | INTRAVENOUS | Status: AC
  Administered 2023-05-03: 8 mg via INTRAVENOUS
  Filled 2023-05-02: qty 8

## 2023-05-02 MED ORDER — PROCHLORPERAZINE EDISYLATE 10 MG/2ML IJ SOLN: 10.0000 mg | Freq: Four times a day (QID) | INTRAMUSCULAR | Status: DC | PRN

## 2023-05-02 MED ORDER — AMINO ACID RADIOPROTECTANT - L-LYSINE 2.5%/L-ARGININE 2.5% IN NS
250.0000 mL/h | INTRAVENOUS | Status: AC
  Filled 2023-05-02: qty 1000

## 2023-05-02 MED ORDER — SODIUM CHLORIDE 0.9 % IV SOLN
8.0000 mg | Freq: Once | INTRAVENOUS | Status: DC
  Filled 2023-05-02: qty 4

## 2023-05-02 NOTE — Written Directive (Addendum)
 LUTATHERA THERAPY   RADIOPHARMACEUTICAL:  Lutetium 177 Dotatate (Lutathera)     PRESCRIBED DOSE FOR ADMINISTRATION:  200 mCi   ROUTE OFADMINISTRATION:  IV   DIAGNOSIS: 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     REFERRING PHYSICIAN: Dr. Mosetta Putt    TREATMENT #: 1    DATE OF LAST LONG LIVED SOMATOSTATIN INJECTION: n/a (discontinued)    ADDITIONAL PHYSICIAN COMMENTS/NOTES: No sandostatin injection  AUTHORIZED USER SIGNATURE & TIME STAMP: Patriciaann Clan, MD   05/02/23    12:25 PM

## 2023-05-03 ENCOUNTER — Encounter (HOSPITAL_COMMUNITY)
Admission: RE | Admit: 2023-05-03 | Discharge: 2023-05-03 | Disposition: A | Discharge status: Home / Self Care | Payer: Medicare HMO | Source: Ambulatory Visit | Attending: Hematology | Admitting: Hematology

## 2023-05-03 VITALS — BP 111/56 | HR 56

## 2023-05-03 DIAGNOSIS — C7B8 Other secondary neuroendocrine tumors: Secondary | ICD-10-CM | POA: Diagnosis not present

## 2023-05-03 DIAGNOSIS — C7A8 Other malignant neuroendocrine tumors: Secondary | ICD-10-CM | POA: Diagnosis not present

## 2023-05-03 DIAGNOSIS — C7951 Secondary malignant neoplasm of bone: Secondary | ICD-10-CM | POA: Diagnosis not present

## 2023-05-03 DIAGNOSIS — D3A021 Benign carcinoid tumor of the cecum: Secondary | ICD-10-CM | POA: Diagnosis not present

## 2023-05-03 LAB — CBC WITH DIFFERENTIAL/PLATELET
Abs Immature Granulocytes: 0.02 10*3/uL (ref 0.00–0.07)
Basophils Absolute: 0 10*3/uL (ref 0.0–0.1)
Basophils Relative: 1 %
Eosinophils Absolute: 0.2 10*3/uL (ref 0.0–0.5)
Eosinophils Relative: 3 %
HCT: 37.3 % (ref 36.0–46.0)
Hemoglobin: 11.6 g/dL — ABNORMAL LOW (ref 12.0–15.0)
Immature Granulocytes: 0 %
Lymphocytes Relative: 25 %
Lymphs Abs: 1.3 10*3/uL (ref 0.7–4.0)
MCH: 28.9 pg (ref 26.0–34.0)
MCHC: 31.1 g/dL (ref 30.0–36.0)
MCV: 93 fL (ref 80.0–100.0)
Monocytes Absolute: 0.5 10*3/uL (ref 0.1–1.0)
Monocytes Relative: 9 %
Neutro Abs: 3.3 10*3/uL (ref 1.7–7.7)
Neutrophils Relative %: 62 %
Platelets: 203 10*3/uL (ref 150–400)
RBC: 4.01 MIL/uL (ref 3.87–5.11)
RDW: 13.2 % (ref 11.5–15.5)
WBC: 5.3 10*3/uL (ref 4.0–10.5)
nRBC: 0 % (ref 0.0–0.2)

## 2023-05-03 LAB — UIFE/LIGHT CHAINS/TP QN, 24-HR UR
FR KAPPA LT CH,24HR: 7.13 mg/(24.h)
FR LAMBDA LT CH,24HR: 1.58 mg/(24.h)
Free Kappa Lt Chains,Ur: 5.7 mg/L (ref 1.17–86.46)
Free Kappa/Lambda Ratio: 4.52 (ref 1.83–14.26)
Free Lambda Lt Chains,Ur: 1.26 mg/L (ref 0.27–15.21)
Total Protein, Urine-Ur/day: 54 mg/(24.h) (ref 30–150)
Total Protein, Urine: 4.3 mg/dL
Total Volume: 1250

## 2023-05-03 LAB — COMPREHENSIVE METABOLIC PANEL
ALT: 19 U/L (ref 0–44)
AST: 20 U/L (ref 15–41)
Albumin: 3.5 g/dL (ref 3.5–5.0)
Alkaline Phosphatase: 100 U/L (ref 38–126)
Anion gap: 7 (ref 5–15)
BUN: 13 mg/dL (ref 8–23)
CO2: 27 mmol/L (ref 22–32)
Calcium: 8.9 mg/dL (ref 8.9–10.3)
Chloride: 105 mmol/L (ref 98–111)
Creatinine, Ser: 0.84 mg/dL (ref 0.44–1.00)
GFR, Estimated: >60 mL/min (ref >60)
Glucose, Bld: 129 mg/dL — ABNORMAL HIGH (ref 70–99)
Potassium: 4.1 mmol/L (ref 3.5–5.1)
Sodium: 139 mmol/L (ref 135–145)
Total Bilirubin: 0.4 mg/dL (ref 0.0–1.2)
Total Protein: 6.6 g/dL (ref 6.5–8.1)

## 2023-05-03 MED ORDER — AMINO ACID RADIOPROTECTANT - L-LYSINE 2.5%/L-ARGININE 2.5% IN NS
250.0000 mL/h | INTRAVENOUS | Status: AC
Start: 2023-05-03 — End: 2023-05-03
  Administered 2023-05-03: 250 mL/h via INTRAVENOUS
  Filled 2023-05-03 (×2): qty 1000

## 2023-05-03 MED ORDER — ONDANSETRON 8 MG/NS 50 ML IVPB
8.0000 mg | Freq: Once | INTRAVENOUS | Status: AC
  Filled 2023-05-03: qty 54

## 2023-05-03 NOTE — Progress Notes (Signed)
 CLINICAL DATA: [89] year-old [female] with metastatic neuroendocrine tumor. Well differentiated tumor with somatostatin receptor is identified within the [pulmonary nodules, peritoneum and liver.  Additional skeletal metastasis.] By DOTATATE PET CT scan.  EXAM: NUCLEAR MEDICINE LUTATHERA ADMINISTRATION  TECHNIQUE: Infusion: The nuclear medicine technologist and I personally verified the dose activity ([205] mCi) to be delivered as specified in the written directive (200 mCi), and verified the patient identification via 2 separate methods.  20 gauge IV were started in the antecubital veins. Anti-emetics were administered by nursing staff. Amino acid renal protection was initiated 30 minutes prior to Lu 177 DOTATATE (Lutathera) infusion and continued continuously for 4 hours. Lutathera infusion was administered over 30 minutes.      The total administered dose was [203.6] mCi Lu 177 DOTATATE.    The entire IV tubing, venocatheter, stopcock and syringes was removed in total, placed in a disposal bag and sent for assay of the residual activity, which will be reported at a later time in our EMR by the physics staff. Pressure was applied to the venipuncture sites, and a compression bandage placed. Radiation Safety personnel were present to perform the discharge survey, as detailed on their documentation.   No Sandostatin injection.  Prior adverse reactions   RADIOPHARMACEUTICALS:   [203.6] mCi Lu 177 DOTATATE   FINDINGS: Diagnosis: [Metastatic neuroendocrine tumor.]    Current Infusion: [1]  Planned Infusions: [4]  Patient reports [minimal] interval symptoms prior to therapy.  The patient's most recent blood counts were reviewed and remains a good candidate to proceed with Lutathera. The patient was situated in an infusion suite and administered Lutathera as above. Patient will follow-up with referring oncologist for interval serum laboratories (CBC and CMP) in approximately 4 weeks.     Sandostatin injection deferred.    IMPRESSION: [First]  Lu 177 DOTATATE treatment for metastatic neuroendocrine tumor. The patient tolerated the infusion well. The patient will return in 8 weeks for ongoing care.

## 2023-05-03 NOTE — Progress Notes (Signed)
 Tolerated first Lutathera treatment well.

## 2023-05-07 NOTE — Assessment & Plan Note (Signed)
 pT3 N1 M0, stage IIIB, lung, nodes and peritoneal metastasis 09/2019 -diagnosed 01/2014, s/p resection 02/03/14 showing 3 cm NET involving serosal tissue. Margins negative, but 5/25 positive nodes with extracapsular extension. -10/06/19 Dotatate PET showed metastatic disease to peritoneum, abdominal and mediastinal lymph nodes, and lungs. Indeterminate liver lesions. 10/29/19 peritoneal biopsy confirmed well-differentiated neuroendocrine tumor. -she took Sandostatin injections monthly 11/06/19 - 01/05/20, discontinued due to hyperglycemia, fatigue, and an episode of syncope and fall right after the injection. -for symptom management, she took one dose lanreotide on 02/24/21. This improved her diarrhea, but she developed vision issues in her right eye, and stopped -restaging CT CAP 02/08/2022 showed stable to slightly increased pulmonary nodules and peritoneal mets; otherwise no new signs of metastatic disease. Will continue to monitor given poor tolerance of prior treatment. -we discussed alternative therapy with oral Afinitor if she has disease progression. will continue observation.  Given her overall low disease burden, mild symptoms, will plan to continue observation. -She was seen in the emergency room on July 26, 2022 for chest pain, CT chest with contrast was obtained during her visit, which showed negative PE, stable lung nodules, no other acute findings.  I personally reviewed and discussed with patient. -Restaging CT 09/20/22 showed mild progression of pulmonary and peritoneal metastasis, stable RP nodes. Plan to continue monitoring -due to disease progression on CT in 02/2023, confirmed by dotatate PET scan in February 2025, patient started Lutathera treatment on May 03, 2023

## 2023-05-08 ENCOUNTER — Inpatient Hospital Stay: Payer: Medicare HMO

## 2023-05-08 ENCOUNTER — Encounter: Payer: Self-pay | Admitting: Hematology

## 2023-05-08 ENCOUNTER — Inpatient Hospital Stay: Payer: Medicare HMO | Admitting: Hematology

## 2023-05-08 VITALS — BP 135/58 | HR 88 | Temp 97.7°F | Resp 20 | Ht 65.0 in | Wt 152.6 lb

## 2023-05-08 DIAGNOSIS — C7B8 Other secondary neuroendocrine tumors: Secondary | ICD-10-CM | POA: Diagnosis not present

## 2023-05-08 DIAGNOSIS — C7A019 Malignant carcinoid tumor of the small intestine, unspecified portion: Secondary | ICD-10-CM | POA: Diagnosis not present

## 2023-05-08 DIAGNOSIS — C7B09 Secondary carcinoid tumors of other sites: Secondary | ICD-10-CM | POA: Diagnosis not present

## 2023-05-08 LAB — CMP (CANCER CENTER ONLY)
ALT: 13 U/L (ref 0–44)
AST: 11 U/L — ABNORMAL LOW (ref 15–41)
Albumin: 3.6 g/dL (ref 3.5–5.0)
Alkaline Phosphatase: 98 U/L (ref 38–126)
Anion gap: 5 (ref 5–15)
BUN: 13 mg/dL (ref 8–23)
CO2: 27 mmol/L (ref 22–32)
Calcium: 8.8 mg/dL — ABNORMAL LOW (ref 8.9–10.3)
Chloride: 107 mmol/L (ref 98–111)
Creatinine: 0.73 mg/dL (ref 0.44–1.00)
GFR, Estimated: >60 mL/min (ref >60)
Glucose, Bld: 203 mg/dL — ABNORMAL HIGH (ref 70–99)
Potassium: 3.8 mmol/L (ref 3.5–5.1)
Sodium: 139 mmol/L (ref 135–145)
Total Bilirubin: 0.3 mg/dL (ref 0.0–1.2)
Total Protein: 6.2 g/dL — ABNORMAL LOW (ref 6.5–8.1)

## 2023-05-08 LAB — CBC WITH DIFFERENTIAL (CANCER CENTER ONLY)
Abs Immature Granulocytes: 0.01 10*3/uL (ref 0.00–0.07)
Basophils Absolute: 0 10*3/uL (ref 0.0–0.1)
Basophils Relative: 1 %
Eosinophils Absolute: 0.1 10*3/uL (ref 0.0–0.5)
Eosinophils Relative: 2 %
HCT: 35 % — ABNORMAL LOW (ref 36.0–46.0)
Hemoglobin: 11.4 g/dL — ABNORMAL LOW (ref 12.0–15.0)
Immature Granulocytes: 0 %
Lymphocytes Relative: 16 %
Lymphs Abs: 1 10*3/uL (ref 0.7–4.0)
MCH: 28.5 pg (ref 26.0–34.0)
MCHC: 32.6 g/dL (ref 30.0–36.0)
MCV: 87.5 fL (ref 80.0–100.0)
Monocytes Absolute: 0.4 10*3/uL (ref 0.1–1.0)
Monocytes Relative: 6 %
Neutro Abs: 4.5 10*3/uL (ref 1.7–7.7)
Neutrophils Relative %: 75 %
Platelet Count: 203 10*3/uL (ref 150–400)
RBC: 4 MIL/uL (ref 3.87–5.11)
RDW: 13.1 % (ref 11.5–15.5)
WBC Count: 6 10*3/uL (ref 4.0–10.5)
nRBC: 0 % (ref 0.0–0.2)

## 2023-05-08 NOTE — Progress Notes (Signed)
 [facility]   [phone] [phone]   Clinic Follow up Note   Patient Care Team: Pincus Sanes, MD as PCP - General (Internal Medicine) Jodelle Red, MD as PCP - Cardiology (Cardiology) Malachy Mood, MD as Consulting Physician (Hematology) Avel Peace, MD as Consulting Physician (General Surgery) Szabat, Vinnie Level, [facility] (Inactive) as Pharmacist (Pharmacist) Elwin Mocha, MD as Consulting Physician (Ophthalmology)  Date of Service:  05/08/2023  CHIEF COMPLAINT: f/u of metastatic neuroendocrine tumor  CURRENT THERAPY:  Lutathera therapy  Oncology History   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 (HCC) pT3 N1 M0, stage IIIB, lung, nodes and peritoneal metastasis 09/2019 -diagnosed 01/2014, s/p resection 02/03/14 showing 3 cm NET involving serosal tissue. Margins negative, but 5/25 positive nodes with extracapsular extension. -10/06/19 Dotatate PET showed metastatic disease to peritoneum, abdominal and mediastinal lymph nodes, and lungs. Indeterminate liver lesions. 10/29/19 peritoneal biopsy confirmed well-differentiated neuroendocrine tumor. -she took Sandostatin injections monthly 11/06/19 - 01/05/20, discontinued due to hyperglycemia, fatigue, and an episode of syncope and fall right after the injection. -for symptom management, she took one dose lanreotide on 02/24/21. This improved her diarrhea, but she developed vision issues in her right eye, and stopped -restaging CT CAP 02/08/2022 showed stable to slightly increased pulmonary nodules and peritoneal mets; otherwise no new signs of metastatic disease. Will continue to monitor given poor tolerance of prior treatment. -we discussed alternative therapy with oral Afinitor if she has disease progression. will continue observation.  Given her overall low disease burden, mild symptoms, will plan to continue observation. -She was seen in the emergency room on July 26, 2022  for chest pain, CT chest with contrast was obtained during her visit, which showed negative PE, stable lung nodules, no other acute findings.  I personally reviewed and discussed with patient. -Restaging CT 09/20/22 showed mild progression of pulmonary and peritoneal metastasis, stable RP nodes. Plan to continue monitoring -due to disease progression on CT in 02/2023, confirmed by dotatate PET scan in February 2025, patient started Lutathera treatment on May 03, 2023   Assessment and Plan    Metastatic neuroendocrine tumor Undergoing Lutathera treatment with four planned sessions every two months. Completed first treatment with significant fatigue, a common side effect, but no nausea. Treatment aims for disease control, not cure, with potential for several years of control. Post-treatment scan in eight months to evaluate response. Chromogranin A level was 329 pre-treatment and will be monitored. Further treatment depends on response. - Continue treatment schedule, next on May 8 - Monitor chromogranin A levels - Perform post-treatment scan after four sessions - Schedule follow-up in three months  Mild anemia Mild anemia with hemoglobin at 11.4, consistent with previous results, under routine monitoring. - Monitor hemoglobin levels with routine blood work  Plan -She has recovered well from first treatment last week -She will continue Lutathera treatment every 2 months as planned -Lab and follow-up in 3 months    SUMMARY OF ONCOLOGIC HISTORY: Oncology History Overview Note  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   Staging form: Colon and Rectum, AJCC 7th Edition     Clinical: T3, N2, M0 - Unsigned     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 (HCC)  01/21/2014 Imaging   CT: Distal small bowel obstruction due to 3.4 cm soft tissue mass near the ileocecal valve, with adjacent right lower quadrant mesenteric lymphadenopathy   02/03/2014 Pathologic Stage    invasive well diff neuroendocrine tumor (carcinoid), 3cm, pT3pN2 (5/25 nodes positive).  negative margines.   02/03/2014 Surgery   Laparoscopic-assisted right colectomy and resection of distal ileum   02/03/2014 Initial Diagnosis   Metastatic malignant neuroendocrine tumor of the ileocecal valve to regional lymph nodes.    05/31/2015 Imaging   CT A/P w contrast  IMPRESSION: 1. No evidence of metastatic disease. 2. Question mild basilar subpleural pulmonary fibrosis.   09/11/2019 Imaging   CT AP W contrast    IMPRESSION: 1. Redemonstrated postoperative findings of ileocolectomy and reanastomosis. 2. There are prominent lymph nodes in the right mesocolon which are slightly increased in size compared to prior examination, the largest node measuring 1.8 x 0.7 cm, previously 1.5 x 0.4 cm. Findings are nonspecific although concerning for nodal metastatic disease. 3. There are additional new small nodules of the transverse mesocolon or omentum measuring up to 9 mm, likewise concerning for nodal or omental metastatic disease. 4. Redemonstrated broad-based midline, fat containing ventral hernia components. 5. Fluid in the endometrial cavity, abnormal in the late postmenopausal setting although unchanged compared to prior examination. 6. Status post interval cholecystectomy. 7. Aortic Atherosclerosis (ICD10-I70.0).   10/06/2019 PET scan   IMPRESSION: 1. Several small nodules in the peritoneal space and retroperitoneal space with intense radiotracer activity consistent with metastatic well differentiated neuroendocrine tumor. 2. Several small liver lesions above background activity are indeterminate. Consider MRI liver with and without contrast. 3. Radiotracer activity within mediastinal lymph nodes and bilateral pulmonary nodules consistent with metastatic well-differentiated neuroendocrine tumor to the lungs. Activity is very low within these lesions but measurable. 4. No clear  evidence skeletal metastasis. Single lesion in the posterior LEFT SI joint warrants attention on follow-up. 5. Although there are multiple sites of metastatic disease, the overall tumor burden of metastatic disease very small.   10/28/2019 Relapse/Recurrence   FINAL MICROSCOPIC DIAGNOSIS:   A. PERITONEAL NODULES, BIOPSY:  -  Well-differentiated neuroendocrine tumor (carcinoid)  -  See comment   COMMENT:   By immunohistochemistry, the neoplastic cells are positive for CD56,  chromogranin and synaptophysin with a low proliferative rate by Ki-67  (less than 1%).  These findings are consistent with metastasis of the  patient's previously diagnosed small bowel carcinoid.  These findings  were discussed with Dr. Blake Divine on October 30, 2019.    11/06/2019 -  Chemotherapy   First-line Monthly Sandostatin injection starting 11/06/19. Held since 01/05/20 due to poor toleration ( hypoglycemia, fatigue, and the episode of syncope and fall right after the injection. The diarrhea and flushing has, but overall symptoms are mild)    05/03/2020 Imaging   CT CAP  IMPRESSION: 1. Stable exam. No new or progressive interval findings. 2. Scattered tiny lung nodules are stable since PET-CT of 10/06/2019. These were hypermetabolic on that exam consistent with metastatic disease. 3. Multiple small omental and mesenteric soft tissue nodules are similar to prior. These were also noted to be hypermetabolic on the PET study. 4. Status post ileocolectomy. 5. Left colonic diverticulosis without diverticulitis. 6. Aortic Atherosclerosis (ICD10-I70.0).     02/08/2021 Imaging   EXAM: CT CHEST, ABDOMEN, AND PELVIS WITH CONTRAST  IMPRESSION: 1. Three of the pulmonary nodules have intervally enlarged, suggesting mild progression of malignancy. The remaining pulmonary nodules in the omental nodules appear stable, and no definite new nodule is identified. 2. Other imaging findings of potential clinical significance:  Aortic Atherosclerosis (ICD10-I70.0). Coronary atherosclerosis. Systemic atherosclerosis. Complex ventral supraumbilical hernia with multiple defects and lobulations. Lumbar spondylosis and degenerative disc disease   07/28/2021 Imaging   EXAM: CT CHEST, ABDOMEN,  AND PELVIS WITH CONTRAST  IMPRESSION: 1. Slight increased size of several pulmonary nodules, as detailed above, concerning for mild progression of metastatic disease to the lungs. In addition, there are findings in the left lower lobe which could be of infectious or inflammatory etiology, although the possibility of developing lymphangitic spread of tumor in the left lower lobe is not excluded. Further clinical evaluation and attention on follow-up studies is recommended. 2. Multiple small soft tissue human plants in the peritoneal cavity appear very similar to the prior examination. No other new signs of metastatic disease noted elsewhere in the abdomen or pelvis. 3. Aortic atherosclerosis, in addition to left anterior descending coronary artery disease. Assessment for potential risk factor modification, dietary therapy or pharmacologic therapy may be warranted, if clinically indicated. 4. Colonic diverticulosis without evidence of acute diverticulitis at this time. 5. Additional incidental findings, similar to prior study, as above.      Discussed the use of AI scribe software for clinical note transcription with the patient, who gave verbal consent to proceed.  History of Present Illness   The patient, a 78 year old female with a history of metastatic neuroendocrine tumor, presents for follow-up after her first treatment. She reports feeling "very tired" for a couple of days following the treatment but has since recovered and returned to work. She denies experiencing any other side effects from the treatment, including nausea.         All other systems were reviewed with the patient and are negative.  MEDICAL  HISTORY:  Past Medical History:  Diagnosis Date   Asthma    Cellulitis March  2014   Left foot and ankle   colon ca dx'd 01/2014   Colon polyps    2015    Diabetes mellitus (HCC) 05/05/2012   Type II   Hyperlipidemia    Morbid obesity (HCC)    Thyroid disease 1990's   HypoThyroidism    SURGICAL HISTORY: Past Surgical History:  Procedure Laterality Date   CHOLECYSTECTOMY N/A 05/02/2017   Procedure: LAPAROSCOPIC CHOLECYSTECTOMY WITH INTRAOPERATIVE CHOLANGIOGRAM, VENTRAL HERNIA REPAIR;  Surgeon: Griselda Miner, MD;  Location: [facility];  Service: General;  Laterality: N/A;   COLON RESECTION N/A 02/03/2014   Procedure: LAPAROSCOPIC ASSISTED BOWEL RESECTION;  Surgeon: Avel Peace, MD;  Location: [facility];  Service: General;  Laterality: N/A;   FRACTURE SURGERY  2000   Ankle   TOOTH EXTRACTION     wisdom Teeth    I have reviewed the social history and family history with the patient and they are unchanged from previous note.  ALLERGIES:  is allergic to penicillins.  MEDICATIONS:  Current Outpatient Medications  Medication Sig Dispense Refill   albuterol (VENTOLIN HFA) 108 (90 Base) MCG/ACT inhaler INHALE 2 PUFFS EVERY 6 HOURS AS NEEDED FOR WHEEZING OR SHORTNESS OF BREATH 54 g 3   atorvastatin (LIPITOR) 10 MG tablet TAKE 1 TABLET EVERY DAY 90 tablet 3   benzonatate (TESSALON) 100 MG capsule Take 1 capsule (100 mg total) by mouth every 8 (eight) hours. 21 capsule 0   cetirizine (ZYRTEC) 10 MG tablet Take 1 tablet (10 mg total) by mouth daily. 15 tablet 0   doxycycline (VIBRAMYCIN) 100 MG capsule Take 1 capsule (100 mg total) by mouth 2 (two) times daily. 20 capsule 0   fluticasone-salmeterol (ADVAIR DISKUS) 500-50 MCG/ACT AEPB Inhale 1 puff into the lungs in the morning and at bedtime. 180 each 3   glimepiride (AMARYL) 2 MG tablet TAKE 1 TABLET BY MOUTH ONCE DAILY  BEFORE BREAKFAST 30 tablet 0   glucose blood test strip Based on insurance preference. Use as instructed once daily to  check sugars. Dx E11.9 100 each 12   Lancets (ONETOUCH ULTRASOFT) lancets Use as instructed once daily to check sugars. Dx E11.9 100 each 12   levothyroxine (SYNTHROID) 112 MCG tablet Take 1 tablet (112 mcg total) by mouth daily. 90 tablet 3   levothyroxine (SYNTHROID) 150 MCG tablet Take 150 mcg by mouth daily before breakfast.     loperamide (IMODIUM) 1 MG/5ML solution Take by mouth as needed for diarrhea or loose stools.     metFORMIN (GLUCOPHAGE-XR) 750 MG 24 hr tablet TAKE 2 TABLETS EVERY DAY WITH SUPPER 180 tablet 3   montelukast (SINGULAIR) 10 MG tablet TAKE 1 TABLET AT BEDTIME 90 tablet 3   naproxen (NAPROSYN) 375 MG tablet Take 1 tablet (375 mg total) by mouth 2 (two) times daily. 20 tablet 0   ondansetron (ZOFRAN) 8 MG tablet Take 1 tablet (8 mg total) by mouth 2 (two) times daily as needed for nausea or vomiting. 20 tablet 0   Prenatal Vit-Fe Fumarate-FA (PRENATAL VITAMINS PO) Take by mouth.     Semaglutide (RYBELSUS) 7 MG TABS Take 1 tablet by mouth once daily 30 tablet 5   No current facility-administered medications for this visit.   Facility-Administered Medications Ordered in Other Visits  Medication Dose Route Frequency Provider Last Rate Last Admin   0.9 %  sodium chloride infusion  500 mL Intravenous Once Lorna Few, MD       lutetium Lu 177 dotatate (LUTATHERA) 370 MBQ/ML 200 millicurie  200 millicurie Intravenous Once Lorna Few, MD       octreotide (SANDOSTATIN) injection 500 mcg  500 mcg Intravenous Once PRN Lorna Few, MD       prochlorperazine (COMPAZINE) injection 10 mg  10 mg Intravenous Q6H PRN Lorna Few, MD        PHYSICAL EXAMINATION: ECOG PERFORMANCE STATUS: 1 - Symptomatic but completely ambulatory  Vitals:   05/08/23 0850  BP: (!) 135/58  Pulse: 88  Resp: 20  Temp: 97.7 F (36.5 C)  SpO2: 97%   Wt Readings from Last 3 Encounters:  05/08/23 152 lb 9.6 oz (69.2 kg)  02/22/23 152 lb 1.6 oz (69 kg)  11/22/22 148 lb 1.6 oz (67.2 kg)      GENERAL:alert, no distress and comfortable SKIN: skin color, texture, turgor are normal, no rashes or significant lesions EYES: normal, Conjunctiva are pink and non-injected, sclera clear NECK: supple, thyroid normal size, non-tender, without nodularity LYMPH:  no palpable lymphadenopathy in the cervical, axillary  LUNGS: clear to auscultation and percussion with normal breathing effort HEART: regular rate & rhythm and no murmurs and no lower extremity edema ABDOMEN:abdomen soft, non-tender and normal bowel sounds Musculoskeletal:no cyanosis of digits and no clubbing  NEURO: alert & oriented x 3 with fluent speech, no focal motor/sensory deficits    LABORATORY DATA:  I have reviewed the data as listed    Latest Ref Rng & Units 05/08/2023    8:29 AM 05/03/2023    8:00 AM 04/26/2023    8:07 AM  CBC  WBC 4.0 - 10.5 K/uL 6.0  5.3  7.0   Hemoglobin 12.0 - 15.0 g/dL 62.9  52.8  41.3   Hematocrit 36.0 - 46.0 % 35.0  37.3  36.4   Platelets 150 - 400 K/uL 203  203  207         Latest Ref Rng & Units 05/03/2023  8:00 AM 04/26/2023    8:07 AM 02/16/2023    8:04 AM  CMP  Glucose 70 - 99 mg/dL 638  756  433   BUN 8 - 23 mg/dL 13  11  13    Creatinine 0.44 - 1.00 mg/dL 2.95  1.88  4.16   Sodium 135 - 145 mmol/L 139  141  144   Potassium 3.5 - 5.1 mmol/L 4.1  4.3  4.3   Chloride 98 - 111 mmol/L 105  106  110   CO2 22 - 32 mmol/L 27  30  28    Calcium 8.9 - 10.3 mg/dL 8.9  8.8  9.0   Total Protein 6.5 - 8.1 g/dL 6.6  6.6  6.7   Total Bilirubin 0.0 - 1.2 mg/dL 0.4  0.3  0.4   Alkaline Phos 38 - 126 U/L 100  106  107   AST 15 - 41 U/L 20  15  14    ALT 0 - 44 U/L 19  15  14        RADIOGRAPHIC STUDIES: I have personally reviewed the radiological images as listed and agreed with the findings in the report. No results found.    No orders of the defined types were placed in this encounter.  All questions were answered. The patient knows to call the clinic with any problems, questions  or concerns. No barriers to learning was detected. The total time spent in the appointment was 20 minutes.     Malachy Mood, MD 05/08/2023

## 2023-05-21 ENCOUNTER — Encounter: Payer: Self-pay | Admitting: Internal Medicine

## 2023-05-21 NOTE — Progress Notes (Unsigned)
 Subjective:    Patient ID: Jessica Farrell, female    DOB: 1945/12/22, 78 y.o.   MRN: [id number]     HPI Lylla is here for follow up of her chronic medical problems.   Currently on Lutathera treatments with 4 planned sessions every 2 months for her metastatic neuroendocrine tumor.  She does have fatigue and feels colder related to the treatments.  Medications and allergies reviewed with patient and updated if appropriate.  Current Outpatient Medications on File Prior to Visit  Medication Sig Dispense Refill   albuterol (VENTOLIN HFA) 108 (90 Base) MCG/ACT inhaler INHALE 2 PUFFS EVERY 6 HOURS AS NEEDED FOR WHEEZING OR SHORTNESS OF BREATH 54 g 3   atorvastatin (LIPITOR) 10 MG tablet TAKE 1 TABLET EVERY DAY 90 tablet 3   cetirizine (ZYRTEC) 10 MG tablet Take 1 tablet (10 mg total) by mouth daily. 15 tablet 0   doxycycline (VIBRAMYCIN) 100 MG capsule Take 1 capsule (100 mg total) by mouth 2 (two) times daily. 20 capsule 0   fluticasone-salmeterol (ADVAIR DISKUS) 500-50 MCG/ACT AEPB Inhale 1 puff into the lungs in the morning and at bedtime. 180 each 3   glimepiride (AMARYL) 2 MG tablet TAKE 1 TABLET BY MOUTH ONCE DAILY BEFORE BREAKFAST 30 tablet 0   glucose blood test strip Based on insurance preference. Use as instructed once daily to check sugars. Dx E11.9 100 each 12   Lancets (ONETOUCH ULTRASOFT) lancets Use as instructed once daily to check sugars. Dx E11.9 100 each 12   levothyroxine (SYNTHROID) 112 MCG tablet Take 1 tablet (112 mcg total) by mouth daily. 90 tablet 3   loperamide (IMODIUM) 1 MG/5ML solution Take by mouth as needed for diarrhea or loose stools.     montelukast (SINGULAIR) 10 MG tablet TAKE 1 TABLET AT BEDTIME 90 tablet 3   naproxen (NAPROSYN) 375 MG tablet Take 1 tablet (375 mg total) by mouth 2 (two) times daily. 20 tablet 0   ondansetron (ZOFRAN) 8 MG tablet Take 1 tablet (8 mg total) by mouth 2 (two) times daily as needed for nausea or vomiting. 20 tablet  0   Prenatal Vit-Fe Fumarate-FA (PRENATAL VITAMINS PO) Take by mouth.     Semaglutide (RYBELSUS) 7 MG TABS Take 1 tablet by mouth once daily 30 tablet 5   No current facility-administered medications on file prior to visit.     Review of Systems  Constitutional:  Positive for appetite change (decreased) and fatigue (from cancer treatments). Negative for fever.  Respiratory:  Positive for cough (chronic), shortness of breath (mild) and wheezing (asthma related).   Cardiovascular:  Negative for chest pain, palpitations and leg swelling.  Gastrointestinal:  Positive for diarrhea.  Neurological:  Negative for light-headedness and headaches.  Psychiatric/Behavioral:  Negative for dysphoric mood and sleep disturbance. The patient is not nervous/anxious.        Objective:   Vitals:   05/22/23 0829  BP: 112/60  Pulse: 79  Temp: 98.2 F (36.8 C)  SpO2: 98%   BP Readings from Last 3 Encounters:  05/22/23 112/60  05/08/23 (!) 135/58  05/03/23 (!) 111/56   Wt Readings from Last 3 Encounters:  05/22/23 153 lb (69.4 kg)  05/08/23 152 lb 9.6 oz (69.2 kg)  02/22/23 152 lb 1.6 oz (69 kg)   Body mass index is 25.46 kg/m.    Physical Exam Constitutional:      General: She is not in acute distress.    Appearance: Normal appearance.  HENT:  Head: Normocephalic and atraumatic.  Eyes:     Conjunctiva/sclera: Conjunctivae normal.  Cardiovascular:     Rate and Rhythm: Normal rate and regular rhythm.     Heart sounds: Normal heart sounds.  Pulmonary:     Effort: Pulmonary effort is normal. No respiratory distress.     Breath sounds: Normal breath sounds. No wheezing.  Musculoskeletal:     Cervical back: Neck supple.     Right lower leg: No edema.     Left lower leg: No edema.  Lymphadenopathy:     Cervical: No cervical adenopathy.  Skin:    General: Skin is warm and dry.     Findings: No rash.  Neurological:     Mental Status: She is alert. Mental status is at baseline.   Psychiatric:        Mood and Affect: Mood normal.        Behavior: Behavior normal.        Lab Results  Component Value Date   WBC 6.0 05/08/2023   HGB 11.4 (L) 05/08/2023   HCT 35.0 (L) 05/08/2023   PLT 203 05/08/2023   GLUCOSE 203 (H) 05/08/2023   CHOL 126 11/21/2022   TRIG 100.0 11/21/2022   HDL 48.50 11/21/2022   LDLDIRECT 64.0 04/22/2020   LDLCALC 58 11/21/2022   ALT 13 05/08/2023   AST 11 (L) 05/08/2023   NA 139 05/08/2023   K 3.8 05/08/2023   CL 107 05/08/2023   CREATININE 0.73 05/08/2023   BUN 13 05/08/2023   CO2 27 05/08/2023   TSH 0.11 (L) 11/21/2022   INR 0.9 10/28/2019   HGBA1C 6.2 11/21/2022   MICROALBUR 1.1 05/22/2022     Assessment & Plan:    See Problem List for Assessment and Plan of chronic medical problems.

## 2023-05-21 NOTE — Patient Instructions (Addendum)
      Blood work was ordered.       Medications changes include :   None    A referral was ordered and someone will call you to schedule an appointment.     Return in about 6 months (around 11/21/2023) for Physical Exam.

## 2023-05-22 ENCOUNTER — Ambulatory Visit (INDEPENDENT_AMBULATORY_CARE_PROVIDER_SITE_OTHER): Payer: Medicare HMO | Admitting: Internal Medicine

## 2023-05-22 VITALS — BP 112/60 | HR 79 | Temp 98.2°F | Ht 65.0 in | Wt 153.0 lb

## 2023-05-22 DIAGNOSIS — E7849 Other hyperlipidemia: Secondary | ICD-10-CM | POA: Diagnosis not present

## 2023-05-22 DIAGNOSIS — J4520 Mild intermittent asthma, uncomplicated: Secondary | ICD-10-CM

## 2023-05-22 DIAGNOSIS — Z7984 Long term (current) use of oral hypoglycemic drugs: Secondary | ICD-10-CM

## 2023-05-22 DIAGNOSIS — E038 Other specified hypothyroidism: Secondary | ICD-10-CM

## 2023-05-22 DIAGNOSIS — E1165 Type 2 diabetes mellitus with hyperglycemia: Secondary | ICD-10-CM | POA: Diagnosis not present

## 2023-05-22 LAB — COMPREHENSIVE METABOLIC PANEL WITH GFR
ALT: 13 U/L (ref 0–35)
AST: 14 U/L (ref 0–37)
Albumin: 3.7 g/dL (ref 3.5–5.2)
Alkaline Phosphatase: 94 U/L (ref 39–117)
BUN: 12 mg/dL (ref 6–23)
CO2: 28 meq/L (ref 19–32)
Calcium: 9 mg/dL (ref 8.4–10.5)
Chloride: 104 meq/L (ref 96–112)
Creatinine, Ser: 0.74 mg/dL (ref 0.40–1.20)
GFR: 77.71 mL/min (ref >60.00)
Glucose, Bld: 189 mg/dL — ABNORMAL HIGH (ref 70–99)
Potassium: 4.3 meq/L (ref 3.5–5.1)
Sodium: 139 meq/L (ref 135–145)
Total Bilirubin: 0.3 mg/dL (ref 0.2–1.2)
Total Protein: 6.5 g/dL (ref 6.0–8.3)

## 2023-05-22 LAB — LIPID PANEL
Cholesterol: 127 mg/dL (ref 0–200)
HDL: 53.8 mg/dL (ref >39.00)
LDL Cholesterol: 55 mg/dL (ref 0–99)
NonHDL: 72.93
Total CHOL/HDL Ratio: 2
Triglycerides: 89 mg/dL (ref 0.0–149.0)
VLDL: 17.8 mg/dL (ref 0.0–40.0)

## 2023-05-22 LAB — HEMOGLOBIN A1C: Hgb A1c MFr Bld: 7 % — ABNORMAL HIGH (ref 4.6–6.5)

## 2023-05-22 LAB — MICROALBUMIN / CREATININE URINE RATIO
Creatinine,U: 52.5 mg/dL
Microalb Creat Ratio: UNDETERMINED mg/g (ref 0.0–30.0)
Microalb, Ur: <0.7 mg/dL

## 2023-05-22 LAB — TSH: TSH: 0.12 u[IU]/mL — ABNORMAL LOW (ref 0.35–5.50)

## 2023-05-22 MED ORDER — FLUTICASONE-SALMETEROL 500-50 MCG/ACT IN AEPB: 1.0000 | INHALATION_SPRAY | Freq: Two times a day (BID) | RESPIRATORY_TRACT | 3 refills | Status: AC

## 2023-05-22 MED ORDER — RYBELSUS 7 MG PO TABS: 1.0000 | ORAL_TABLET | Freq: Every day | ORAL | 1 refills | Status: DC

## 2023-05-22 NOTE — Assessment & Plan Note (Signed)
 Chronic Regular exercise and healthy diet encouraged Check lipid panel, CMP Continue atorvastatin 10 mg daily

## 2023-05-22 NOTE — Assessment & Plan Note (Signed)
Chronic  Clinically euthyroid Check tsh and will titrate med dose if needed Currently taking levothyroxine 150 mcg daily 

## 2023-05-22 NOTE — Assessment & Plan Note (Signed)
 Chronic Controlled Continue Advair 500-50 mcg per ACT once -twice daily - sometimes forgets it in evening Continue albuterol as needed

## 2023-05-22 NOTE — Assessment & Plan Note (Addendum)
 Chronic  Lab Results  Component Value Date   HGBA1C 6.2 11/21/2022   Sugars controlled Check A1c, urine microalbumin Continue Rybelsus 7 mg daily If A1c not at goal can increase Rybelsus Stressed regular exercise, diabetic diet

## 2023-05-24 MED ORDER — LEVOTHYROXINE SODIUM 100 MCG PO TABS: 100.0000 ug | ORAL_TABLET | Freq: Every day | ORAL | 3 refills | Status: DC

## 2023-05-24 NOTE — Addendum Note (Signed)
 Addended by: Pincus Sanes on: 05/24/2023 04:30 PM   Modules accepted: Orders

## 2023-05-30 ENCOUNTER — Other Ambulatory Visit: Payer: Self-pay | Admitting: Hematology

## 2023-05-30 DIAGNOSIS — C7B8 Other secondary neuroendocrine tumors: Secondary | ICD-10-CM

## 2023-06-18 DIAGNOSIS — H47011 Ischemic optic neuropathy, right eye: Secondary | ICD-10-CM | POA: Diagnosis not present

## 2023-06-21 ENCOUNTER — Inpatient Hospital Stay: Attending: Hematology

## 2023-06-21 DIAGNOSIS — C7B8 Other secondary neuroendocrine tumors: Secondary | ICD-10-CM | POA: Insufficient documentation

## 2023-06-21 DIAGNOSIS — C7A8 Other malignant neuroendocrine tumors: Secondary | ICD-10-CM | POA: Diagnosis not present

## 2023-06-21 LAB — CMP (CANCER CENTER ONLY)
ALT: 19 U/L (ref 0–44)
AST: 20 U/L (ref 15–41)
Albumin: 3.6 g/dL (ref 3.5–5.0)
Alkaline Phosphatase: 104 U/L (ref 38–126)
Anion gap: 3 — ABNORMAL LOW (ref 5–15)
BUN: 17 mg/dL (ref 8–23)
CO2: 29 mmol/L (ref 22–32)
Calcium: 8.6 mg/dL — ABNORMAL LOW (ref 8.9–10.3)
Chloride: 108 mmol/L (ref 98–111)
Creatinine: 0.85 mg/dL (ref 0.44–1.00)
GFR, Estimated: >60 mL/min (ref >60)
Glucose, Bld: 117 mg/dL — ABNORMAL HIGH (ref 70–99)
Potassium: 4.5 mmol/L (ref 3.5–5.1)
Sodium: 140 mmol/L (ref 135–145)
Total Bilirubin: 0.3 mg/dL (ref 0.0–1.2)
Total Protein: 6.3 g/dL — ABNORMAL LOW (ref 6.5–8.1)

## 2023-06-21 LAB — CBC WITH DIFFERENTIAL (CANCER CENTER ONLY)
Abs Immature Granulocytes: 0.01 10*3/uL (ref 0.00–0.07)
Basophils Absolute: 0 10*3/uL (ref 0.0–0.1)
Basophils Relative: 1 %
Eosinophils Absolute: 0.1 10*3/uL (ref 0.0–0.5)
Eosinophils Relative: 3 %
HCT: 32.2 % — ABNORMAL LOW (ref 36.0–46.0)
Hemoglobin: 10.7 g/dL — ABNORMAL LOW (ref 12.0–15.0)
Immature Granulocytes: 0 %
Lymphocytes Relative: 16 %
Lymphs Abs: 0.8 10*3/uL (ref 0.7–4.0)
MCH: 29.4 pg (ref 26.0–34.0)
MCHC: 33.2 g/dL (ref 30.0–36.0)
MCV: 88.5 fL (ref 80.0–100.0)
Monocytes Absolute: 0.5 10*3/uL (ref 0.1–1.0)
Monocytes Relative: 9 %
Neutro Abs: 3.7 10*3/uL (ref 1.7–7.7)
Neutrophils Relative %: 71 %
Platelet Count: 192 10*3/uL (ref 150–400)
RBC: 3.64 MIL/uL — ABNORMAL LOW (ref 3.87–5.11)
RDW: 13.5 % (ref 11.5–15.5)
WBC Count: 5.2 10*3/uL (ref 4.0–10.5)
nRBC: 0 % (ref 0.0–0.2)

## 2023-06-22 LAB — CHROMOGRANIN A: Chromogranin A (ng/mL): 312.4 ng/mL — ABNORMAL HIGH (ref 0.0–101.8)

## 2023-06-27 MED ORDER — SODIUM CHLORIDE 0.9 % IV SOLN
8.0000 mg | Freq: Once | INTRAVENOUS | Status: AC
  Administered 2023-06-28: 8 mg via INTRAVENOUS
  Filled 2023-06-27: qty 4

## 2023-06-27 MED ORDER — PROCHLORPERAZINE EDISYLATE 10 MG/2ML IJ SOLN: 10.0000 mg | Freq: Four times a day (QID) | INTRAMUSCULAR | Status: DC | PRN

## 2023-06-27 MED ORDER — AMINO ACID RADIOPROTECTANT - L-LYSINE 2.5%/L-ARGININE 2.5% IN NS
250.0000 mL/h | INTRAVENOUS | Status: AC
  Filled 2023-06-27: qty 1000

## 2023-06-27 MED ORDER — ONDANSETRON HCL 8 MG PO TABS
8.0000 mg | ORAL_TABLET | Freq: Two times a day (BID) | ORAL | 0 refills | Status: AC | PRN
Start: 2023-06-27 — End: ?

## 2023-06-27 MED ORDER — OCTREOTIDE ACETATE 500 MCG/ML IJ SOLN
500.0000 ug | Freq: Once | INTRAMUSCULAR | Status: DC | PRN
Start: 2023-06-27 — End: 2023-07-04

## 2023-06-27 MED ORDER — SODIUM CHLORIDE 0.9 % IV SOLN
500.0000 mL | Freq: Once | INTRAVENOUS | Status: DC
Start: 2023-06-27 — End: 2023-07-04

## 2023-06-27 MED ORDER — LUTETIUM LU 177 DOTATATE 370 MBQ/ML IV SOLN: 200.0000 | Freq: Once | INTRAVENOUS | Status: DC

## 2023-06-27 NOTE — Written Directive (Addendum)
 LUTATHERA  THERAPY   RADIOPHARMACEUTICAL:  Lutetium 177 Dotatate (Lutathera )     PRESCRIBED DOSE FOR ADMINISTRATION:  200 mCi   ROUTE OFADMINISTRATION:  IV   DIAGNOSIS:  Metastatic malignant neuoendocrine tumor to lymph node, 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    REFERRING PHYSICIAN:Feng, Gracie Lav, MD    TREATMENT #: 2   DATE OF LAST LONG LIVED SOMATOSTATIN INJECTION:   ADDITIONAL PHYSICIAN COMMENTS/NOTES:   AUTHORIZED USER SIGNATURE & TIME STAMP: Reino Carbo, MD   06/27/23    4:07 PM

## 2023-06-28 ENCOUNTER — Encounter (HOSPITAL_COMMUNITY): Payer: Medicare HMO

## 2023-06-28 ENCOUNTER — Encounter (HOSPITAL_COMMUNITY)
Admission: RE | Admit: 2023-06-28 | Discharge: 2023-06-28 | Disposition: A | Discharge status: Home / Self Care | Payer: Medicare HMO | Source: Ambulatory Visit | Attending: Hematology | Admitting: Hematology

## 2023-06-28 DIAGNOSIS — C7A8 Other malignant neuroendocrine tumors: Secondary | ICD-10-CM | POA: Diagnosis not present

## 2023-06-28 DIAGNOSIS — C7B8 Other secondary neuroendocrine tumors: Secondary | ICD-10-CM | POA: Diagnosis not present

## 2023-06-28 MED ORDER — SODIUM CHLORIDE 0.9 % IV SOLN
500.0000 mL | Freq: Once | INTRAVENOUS | Status: AC
  Administered 2023-06-28: 500 mL via INTRAVENOUS

## 2023-06-28 MED ORDER — AMINO ACID RADIOPROTECTANT - L-LYSINE 2.5%/L-ARGININE 2.5% IN NS
250.0000 mL/h | INTRAVENOUS | Status: AC
  Administered 2023-06-28: 250 mL/h via INTRAVENOUS
  Filled 2023-06-28: qty 1000

## 2023-06-28 MED ORDER — LUTETIUM LU 177 DOTATATE 370 MBQ/ML IV SOLN
200.0000 | Freq: Once | INTRAVENOUS | Status: AC
  Administered 2023-06-28: 200 via INTRAVENOUS

## 2023-06-28 NOTE — Progress Notes (Signed)
 CLINICAL DATA: [age] year-old [female] with metastatic neuroendocrine tumor. Well differentiated tumor with somatostatin receptor is identified within the [pulmonary nodules and liver] by DOTATATE PET CT scan.  EXAM: NUCLEAR MEDICINE LUTATHERA  ADMINISTRATION     The total administered dose was [203] mCi Lu 177 DOTATATE.      RADIOPHARMACEUTICALS:   [Two hundred three] mCi Lu 177 DOTATATE   FINDINGS: Diagnosis: [Metastatic neuroendocrine tumor.]    Current Infusion: [2]    Planned Infusions: [4]    Patient reports [minimal] interval symptoms following therapy.  Patient reports moderate fatigue for several days.      The patient's most recent blood counts were reviewed and remains a good candidate to proceed with Lutathera . The patient was situated in an infusion suite and administered Lutathera  as above. Patient will follow-up with referring oncologist for interval serum laboratories (CBC and CMP) in approximately 4 weeks.     Sandostatin  injection deferred.     IMPRESSION: [Second] Lu 177 DOTATATE treatment for metastatic neuroendocrine tumor. The patient tolerated the infusion well. The patient will return in 8 weeks for ongoing care.

## 2023-06-28 NOTE — Progress Notes (Signed)
 Pt. Tolerated Lutathera  treatment #2 well today. Vitals remained stable. Pt. Did not have any adverse reactions to the treatment or pre-med. Due to the pt. Fainting from two previous sandostatin  doses she did not receive that today.

## 2023-08-13 ENCOUNTER — Telehealth: Payer: Self-pay | Admitting: Internal Medicine

## 2023-08-13 NOTE — Telephone Encounter (Signed)
 Copied from CRM [id number]. Topic: Clinical - Request for Lab/Test Order >> Aug 13, 2023 10:29 AM Jessica Farrell wrote: Reason for CRM: Patient called in stating she needed to be schedule for labs to check her thyroid , would like a callback to be schedule once order is in

## 2023-08-13 NOTE — Telephone Encounter (Signed)
 Message for patient.  She is past due to come in so she can come anytime and have TSH lab done.

## 2023-08-16 ENCOUNTER — Other Ambulatory Visit (INDEPENDENT_AMBULATORY_CARE_PROVIDER_SITE_OTHER)

## 2023-08-16 ENCOUNTER — Inpatient Hospital Stay: Attending: Hematology

## 2023-08-16 DIAGNOSIS — C7B8 Other secondary neuroendocrine tumors: Secondary | ICD-10-CM | POA: Insufficient documentation

## 2023-08-16 DIAGNOSIS — E038 Other specified hypothyroidism: Secondary | ICD-10-CM | POA: Diagnosis not present

## 2023-08-16 DIAGNOSIS — C7A8 Other malignant neuroendocrine tumors: Secondary | ICD-10-CM | POA: Insufficient documentation

## 2023-08-16 LAB — CMP (CANCER CENTER ONLY)
ALT: 19 U/L (ref 0–44)
AST: 18 U/L (ref 15–41)
Albumin: 3.8 g/dL (ref 3.5–5.0)
Alkaline Phosphatase: 110 U/L (ref 38–126)
Anion gap: 7 (ref 5–15)
BUN: 12 mg/dL (ref 8–23)
CO2: 28 mmol/L (ref 22–32)
Calcium: 9.1 mg/dL (ref 8.9–10.3)
Chloride: 107 mmol/L (ref 98–111)
Creatinine: 0.84 mg/dL (ref 0.44–1.00)
GFR, Estimated: >60 mL/min (ref >60)
Glucose, Bld: 195 mg/dL — ABNORMAL HIGH (ref 70–99)
Potassium: 4.3 mmol/L (ref 3.5–5.1)
Sodium: 142 mmol/L (ref 135–145)
Total Bilirubin: 0.4 mg/dL (ref 0.0–1.2)
Total Protein: 6.7 g/dL (ref 6.5–8.1)

## 2023-08-16 LAB — CBC WITH DIFFERENTIAL (CANCER CENTER ONLY)
Abs Immature Granulocytes: 0 10*3/uL (ref 0.00–0.07)
Basophils Absolute: 0.1 10*3/uL (ref 0.0–0.1)
Basophils Relative: 1 %
Eosinophils Absolute: 0.1 10*3/uL (ref 0.0–0.5)
Eosinophils Relative: 3 %
HCT: 32.5 % — ABNORMAL LOW (ref 36.0–46.0)
Hemoglobin: 10.8 g/dL — ABNORMAL LOW (ref 12.0–15.0)
Immature Granulocytes: 0 %
Lymphocytes Relative: 20 %
Lymphs Abs: 0.8 10*3/uL (ref 0.7–4.0)
MCH: 29.8 pg (ref 26.0–34.0)
MCHC: 33.2 g/dL (ref 30.0–36.0)
MCV: 89.8 fL (ref 80.0–100.0)
Monocytes Absolute: 0.4 10*3/uL (ref 0.1–1.0)
Monocytes Relative: 9 %
Neutro Abs: 2.4 10*3/uL (ref 1.7–7.7)
Neutrophils Relative %: 67 %
Platelet Count: 145 10*3/uL — ABNORMAL LOW (ref 150–400)
RBC: 3.62 MIL/uL — ABNORMAL LOW (ref 3.87–5.11)
RDW: 13.4 % (ref 11.5–15.5)
WBC Count: 3.7 10*3/uL — ABNORMAL LOW (ref 4.0–10.5)
nRBC: 0 % (ref 0.0–0.2)

## 2023-08-16 LAB — TSH: TSH: 4.31 u[IU]/mL (ref 0.35–5.50)

## 2023-08-17 ENCOUNTER — Ambulatory Visit: Payer: Self-pay | Admitting: Internal Medicine

## 2023-08-18 LAB — CHROMOGRANIN A: Chromogranin A (ng/mL): 270.4 ng/mL — ABNORMAL HIGH (ref 0.0–101.8)

## 2023-08-21 ENCOUNTER — Other Ambulatory Visit (HOSPITAL_COMMUNITY)

## 2023-08-23 ENCOUNTER — Encounter (HOSPITAL_COMMUNITY)
Admission: RE | Admit: 2023-08-23 | Discharge: 2023-08-23 | Disposition: A | Discharge status: Home / Self Care | Source: Ambulatory Visit | Attending: Hematology | Admitting: Hematology

## 2023-08-23 ENCOUNTER — Other Ambulatory Visit (HOSPITAL_COMMUNITY): Payer: Medicare HMO

## 2023-08-23 VITALS — BP 117/69 | HR 55 | Resp 16

## 2023-08-23 DIAGNOSIS — C7B8 Other secondary neuroendocrine tumors: Secondary | ICD-10-CM | POA: Insufficient documentation

## 2023-08-23 DIAGNOSIS — R110 Nausea: Secondary | ICD-10-CM | POA: Diagnosis not present

## 2023-08-23 DIAGNOSIS — C7A8 Other malignant neuroendocrine tumors: Secondary | ICD-10-CM | POA: Diagnosis not present

## 2023-08-23 MED ORDER — PROCHLORPERAZINE EDISYLATE 10 MG/2ML IJ SOLN: 10.0000 mg | Freq: Four times a day (QID) | INTRAMUSCULAR | Status: DC | PRN

## 2023-08-23 MED ORDER — LUTETIUM LU 177 VIPIVOTIDE TET 1000 MBQ/ML IV SOLN: 202.0000 | Freq: Once | INTRAVENOUS | Status: DC

## 2023-08-23 MED ORDER — AMINO ACID RADIOPROTECTANT - L-LYSINE 2.5%/L-ARGININE 2.5% IN NS
250.0000 mL/h | INTRAVENOUS | Status: AC
  Administered 2023-08-23: 250 mL/h via INTRAVENOUS
  Filled 2023-08-23: qty 1000

## 2023-08-23 MED ORDER — SODIUM CHLORIDE 0.9 % IV SOLN: 500.0000 mL | Freq: Once | INTRAVENOUS | Status: DC

## 2023-08-23 MED ORDER — SODIUM CHLORIDE 0.9 % IV SOLN
8.0000 mg | Freq: Once | INTRAVENOUS | Status: AC
  Administered 2023-08-23: 8 mg via INTRAVENOUS
  Filled 2023-08-23: qty 4

## 2023-08-23 MED ORDER — ONDANSETRON HCL 8 MG PO TABS: 8.0000 mg | ORAL_TABLET | Freq: Two times a day (BID) | ORAL | 0 refills | Status: AC | PRN

## 2023-08-23 MED ORDER — LUTETIUM LU 177 DOTATATE 370 MBQ/ML IV SOLN
200.0000 | Freq: Once | INTRAVENOUS | Status: AC
  Administered 2023-08-23: 200 via INTRAVENOUS

## 2023-08-23 MED ORDER — OCTREOTIDE ACETATE 500 MCG/ML IJ SOLN: 500.0000 ug | Freq: Once | INTRAMUSCULAR | Status: DC | PRN

## 2023-08-23 MED ORDER — SODIUM CHLORIDE 0.9 % IV BOLUS: 1000.0000 mL | Freq: Once | INTRAVENOUS | Status: DC

## 2023-08-23 MED ORDER — OCTREOTIDE ACETATE 30 MG IM KIT: 30.0000 mg | PACK | Freq: Once | INTRAMUSCULAR | Status: DC

## 2023-08-23 NOTE — Progress Notes (Signed)
 CLINICAL DATA: [age] year-old [female] with metastatic neuroendocrine tumor. Well differentiated tumor with somatostatin receptor is identified within the [pulmonary nodules and liver] by DOTATATE PET CT scan.  EXAM: NUCLEAR MEDICINE LUTATHERA  ADMINISTRATION  TECHNIQUE: Infusion: The nuclear medicine technologist and I personally verified the dose activity ([203] mCi) to be delivered as specified in the written directive (200 mCi), and verified the patient identification via 2 separate methods.  20 gauge IV were started in the antecubital veins. Anti-emetics were administered by nursing staff. Amino acid renal protection was initiated 30 minutes prior to Lu 177 DOTATATE (Lutathera ) infusion and continued continuously for 4 hours. Lutathera  infusion was administered over 30 minutes.      The total administered dose was [202] mCi Lu 177 DOTATATE.    The entire IV tubing, venocatheter, stopcock and syringes was removed in total, placed in a disposal bag and sent for assay of the residual activity, which will be reported at a later time in our EMR by the physics staff. Pressure was applied to the venipuncture sites, and a compression bandage placed. Radiation Safety personnel were present to perform the discharge survey, as detailed on their documentation.    Patient received 30 mg IM long-acting Sandostatin  injection 4 hours after Lutathera  effusion in the nuclear medicine department.   RADIOPHARMACEUTICALS:   []  mCi Lu 177 DOTATATE   FINDINGS: Diagnosis: [Metastatic neuroendocrine tumor.]    Current Infusion: [3    Planned Infusions: [4]    Patient reports [minimal] interval symptoms following therapy.  Patient had mild the T somewhat improved from initial therapy.     Patient also reported mild-to-moderate nausea which require oral Zofran  for several days.      No evidence of mild suppression renal toxicity.      The patient's most recent blood counts were  reviewed and remains a good candidate to proceed with Lutathera . The patient was situated in an infusion suite and administered Lutathera  as above. Patient will follow-up with referring oncologist for interval serum laboratories (CBC and CMP) in approximately 4 weeks.       No Sandostatin  injections.     IMPRESSION: [Third] Lu 177 DOTATATE treatment for metastatic neuroendocrine tumor. The patient tolerated the infusion well. The patient will return in 8 weeks for ongoing care.

## 2023-08-23 NOTE — Written Directive (Addendum)
 LUTATHERA  THERAPY   RADIOPHARMACEUTICAL:  Lutetium 177 Dotatate (Lutathera )     PRESCRIBED DOSE FOR ADMINISTRATION:  200 mCi   ROUTE OFADMINISTRATION:  IV   DIAGNOSIS:  Metastatic Neuroendocrine Tumor   REFERRING PHYSICIAN: Onita Mattock   TREATMENT #: 3   DATE OF LAST LONG LIVED SOMATOSTATIN INJECTION: no current injections, sando in 2021, lanreotide 2023    ADDITIONAL PHYSICIAN COMMENTS/NOTES:   AUTHORIZED USER SIGNATURE & TIME STAMP: Norleen GORMAN Boxer, MD   08/23/23    8:18 AM

## 2023-08-28 ENCOUNTER — Other Ambulatory Visit: Payer: Self-pay

## 2023-08-28 NOTE — Assessment & Plan Note (Signed)
 pT3 N1 M0, stage IIIB, lung, nodes and peritoneal metastasis 09/2019 -diagnosed 01/2014, s/p resection 02/03/14 showing 3 cm NET involving serosal tissue. Margins negative, but 5/25 positive nodes with extracapsular extension. -10/06/19 Dotatate PET showed metastatic disease to peritoneum, abdominal and mediastinal lymph nodes, and lungs. Indeterminate liver lesions. 10/29/19 peritoneal biopsy confirmed well-differentiated neuroendocrine tumor. -she took Sandostatin injections monthly 11/06/19 - 01/05/20, discontinued due to hyperglycemia, fatigue, and an episode of syncope and fall right after the injection. -for symptom management, she took one dose lanreotide on 02/24/21. This improved her diarrhea, but she developed vision issues in her right eye, and stopped -restaging CT CAP 02/08/2022 showed stable to slightly increased pulmonary nodules and peritoneal mets; otherwise no new signs of metastatic disease. Will continue to monitor given poor tolerance of prior treatment. -we discussed alternative therapy with oral Afinitor if she has disease progression. will continue observation.  Given her overall low disease burden, mild symptoms, will plan to continue observation. -She was seen in the emergency room on July 26, 2022 for chest pain, CT chest with contrast was obtained during her visit, which showed negative PE, stable lung nodules, no other acute findings.  I personally reviewed and discussed with patient. -Restaging CT 09/20/22 showed mild progression of pulmonary and peritoneal metastasis, stable RP nodes. Plan to continue monitoring -due to disease progression on CT in 02/2023, confirmed by dotatate PET scan in February 2025, patient started Lutathera treatment on May 03, 2023

## 2023-08-29 ENCOUNTER — Encounter: Payer: Self-pay | Admitting: Hematology

## 2023-08-29 ENCOUNTER — Inpatient Hospital Stay (HOSPITAL_BASED_OUTPATIENT_CLINIC_OR_DEPARTMENT_OTHER): Admitting: Hematology

## 2023-08-29 ENCOUNTER — Inpatient Hospital Stay: Attending: Hematology

## 2023-08-29 VITALS — BP 122/60 | HR 96 | Temp 98.3°F | Resp 16 | Ht 65.0 in | Wt 155.1 lb

## 2023-08-29 DIAGNOSIS — C7B8 Other secondary neuroendocrine tumors: Secondary | ICD-10-CM

## 2023-08-29 DIAGNOSIS — C7A8 Other malignant neuroendocrine tumors: Secondary | ICD-10-CM | POA: Diagnosis not present

## 2023-08-29 DIAGNOSIS — D649 Anemia, unspecified: Secondary | ICD-10-CM | POA: Diagnosis not present

## 2023-08-29 LAB — CMP (CANCER CENTER ONLY)
ALT: 15 U/L (ref 0–44)
AST: 15 U/L (ref 15–41)
Albumin: 3.5 g/dL (ref 3.5–5.0)
Alkaline Phosphatase: 105 U/L (ref 38–126)
Anion gap: 5 (ref 5–15)
BUN: 15 mg/dL (ref 8–23)
CO2: 30 mmol/L (ref 22–32)
Calcium: 8.9 mg/dL (ref 8.9–10.3)
Chloride: 107 mmol/L (ref 98–111)
Creatinine: 0.88 mg/dL (ref 0.44–1.00)
GFR, Estimated: >60 mL/min (ref >60)
Glucose, Bld: 218 mg/dL — ABNORMAL HIGH (ref 70–99)
Potassium: 3.7 mmol/L (ref 3.5–5.1)
Sodium: 142 mmol/L (ref 135–145)
Total Bilirubin: 0.4 mg/dL (ref 0.0–1.2)
Total Protein: 6.3 g/dL — ABNORMAL LOW (ref 6.5–8.1)

## 2023-08-29 LAB — CBC WITH DIFFERENTIAL (CANCER CENTER ONLY)
Abs Immature Granulocytes: 0.02 K/uL (ref 0.00–0.07)
Basophils Absolute: 0 K/uL (ref 0.0–0.1)
Basophils Relative: 0 %
Eosinophils Absolute: 0.1 K/uL (ref 0.0–0.5)
Eosinophils Relative: 2 %
HCT: 32 % — ABNORMAL LOW (ref 36.0–46.0)
Hemoglobin: 10.7 g/dL — ABNORMAL LOW (ref 12.0–15.0)
Immature Granulocytes: 0 %
Lymphocytes Relative: 8 %
Lymphs Abs: 0.4 K/uL — ABNORMAL LOW (ref 0.7–4.0)
MCH: 29.9 pg (ref 26.0–34.0)
MCHC: 33.4 g/dL (ref 30.0–36.0)
MCV: 89.4 fL (ref 80.0–100.0)
Monocytes Absolute: 0.3 K/uL (ref 0.1–1.0)
Monocytes Relative: 7 %
Neutro Abs: 3.8 K/uL (ref 1.7–7.7)
Neutrophils Relative %: 83 %
Platelet Count: 158 K/uL (ref 150–400)
RBC: 3.58 MIL/uL — ABNORMAL LOW (ref 3.87–5.11)
RDW: 13.2 % (ref 11.5–15.5)
WBC Count: 4.6 K/uL (ref 4.0–10.5)
nRBC: 0 % (ref 0.0–0.2)

## 2023-08-29 NOTE — Progress Notes (Signed)
 [facility]   [phone] [phone]   Clinic Follow up Note   Patient Care Team: Geofm Glade PARAS, MD as PCP - General (Internal Medicine) Lonni Slain, MD as PCP - Cardiology (Cardiology) Lanny Callander, MD as Consulting Physician (Hematology) Lily Boas, MD as Consulting Physician (General Surgery) Szabat, Toribio BROCKS, [facility] (Inactive) as Pharmacist (Pharmacist) Maree Lonni Inks, MD as Consulting Physician (Ophthalmology)  Date of Service:  08/29/2023  CHIEF COMPLAINT: f/u of metastatic neuroendocrine tumor  CURRENT THERAPY:  Lutathera   Oncology History   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 (HCC) pT3 N1 M0, stage IIIB, lung, nodes and peritoneal metastasis 09/2019 -diagnosed 01/2014, s/p resection 02/03/14 showing 3 cm NET involving serosal tissue. Margins negative, but 5/25 positive nodes with extracapsular extension. -10/06/19 Dotatate PET showed metastatic disease to peritoneum, abdominal and mediastinal lymph nodes, and lungs. Indeterminate liver lesions. 10/29/19 peritoneal biopsy confirmed well-differentiated neuroendocrine tumor. -she took Sandostatin  injections monthly 11/06/19 - 01/05/20, discontinued due to hyperglycemia, fatigue, and an episode of syncope and fall right after the injection. -for symptom management, she took one dose lanreotide on 02/24/21. This improved her diarrhea, but she developed vision issues in her right eye, and stopped -restaging CT CAP 02/08/2022 showed stable to slightly increased pulmonary nodules and peritoneal mets; otherwise no new signs of metastatic disease. Will continue to monitor given poor tolerance of prior treatment. -we discussed alternative therapy with oral Afinitor if she has disease progression. will continue observation.  Given her overall low disease burden, mild symptoms, will plan to continue observation. -She was seen in the emergency room on July 26, 2022 for chest  pain, CT chest with contrast was obtained during her visit, which showed negative PE, stable lung nodules, no other acute findings.  I personally reviewed and discussed with patient. -Restaging CT 09/20/22 showed mild progression of pulmonary and peritoneal metastasis, stable RP nodes. Plan to continue monitoring -due to disease progression on CT in 02/2023, confirmed by dotatate PET scan in February 2025, patient started Lutathera  treatment on May 03, 2023  Assessment & Plan Metastatic neuroendocrine tumor Undergoing treatment with significant side effects including fatigue and nausea. Completed three treatments, last on Thursday of last week. Next treatment scheduled for October 18, 2023. PET scan to be repeated a month after the last treatment to assess response. - Schedule next treatment for October 18, 2023 - Repeat PET scan one month after the last treatment to assess response - Consider dose reduction for the last treatment after discussion with Dr. Phyllistine  Fatigue due to treatment Significant fatigue following recent treatment, more severe and prolonged than previous treatments. 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 Likely cumulative from treatments. - Advise rest and consider taking time off work until September 02, 2023 - Provide a note for work absence until September 02, 2023 - Consider discussing with Dr. Phyllistine about potential dose reduction for future treatments  Nausea due to treatment Persistent nausea following treatment, not resolved. Prescribed ondansetron  for management. - Continue ondansetron  for nausea management - Consider discussing with Dr. Phyllistine about potential dose reduction for future treatments  Mild anemia Mild anemia with hemoglobin levels in the range of 10 to 11, currently at 10.7. Present for the past six months, may slightly contribute to fatigue but not the primary cause. - Monitor hemoglobin levels  Plan - Patient has had a significant fatigue  since her third Lutathera  treatment last week.  She declined steroid.  I sent a message to  Dr. Malva to see if he has any suggestions, and if patient needs dose reduction for last treatment on August 28 - Follow-up in 3 months with lab and dotatate PET scan 1 week before  SUMMARY OF ONCOLOGIC HISTORY: Oncology History Overview Note  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   Staging form: Colon and Rectum, AJCC 7th Edition     Clinical: T3, N2, M0 - Unsigned     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 (HCC)  01/21/2014 Imaging   CT: Distal small bowel obstruction due to 3.4 cm soft tissue mass near the ileocecal valve, with adjacent right lower quadrant mesenteric lymphadenopathy   02/03/2014 Pathologic Stage   invasive well diff neuroendocrine tumor (carcinoid), 3cm, pT3pN2 (5/25 nodes positive). negative margines.   02/03/2014 Surgery   Laparoscopic-assisted right colectomy and resection of distal ileum   02/03/2014 Initial Diagnosis   Metastatic malignant neuroendocrine tumor of the ileocecal valve to regional lymph nodes.    05/31/2015 Imaging   CT A/P w contrast  IMPRESSION: 1. No evidence of metastatic disease. 2. Question mild basilar subpleural pulmonary fibrosis.   09/11/2019 Imaging   CT AP W contrast    IMPRESSION: 1. Redemonstrated postoperative findings of ileocolectomy and reanastomosis. 2. There are prominent lymph nodes in the right mesocolon which are slightly increased in size compared to prior examination, the largest node measuring 1.8 x 0.7 cm, previously 1.5 x 0.4 cm. Findings are nonspecific although concerning for nodal metastatic disease. 3. There are additional new small nodules of the transverse mesocolon or omentum measuring up to 9 mm, likewise concerning for nodal or omental metastatic disease. 4. Redemonstrated broad-based midline, fat containing ventral hernia components. 5. Fluid in the endometrial cavity,  abnormal in the late postmenopausal setting although unchanged compared to prior examination. 6. Status post interval cholecystectomy. 7. Aortic Atherosclerosis (ICD10-I70.0).   10/06/2019 PET scan   IMPRESSION: 1. Several small nodules in the peritoneal space and retroperitoneal space with intense radiotracer activity consistent with metastatic well differentiated neuroendocrine tumor. 2. Several small liver lesions above background activity are indeterminate. Consider MRI liver with and without contrast. 3. Radiotracer activity within mediastinal lymph nodes and bilateral pulmonary nodules consistent with metastatic well-differentiated neuroendocrine tumor to the lungs. Activity is very low within these lesions but measurable. 4. No clear evidence skeletal metastasis. Single lesion in the posterior LEFT SI joint warrants attention on follow-up. 5. Although there are multiple sites of metastatic disease, the overall tumor burden of metastatic disease very small.   10/28/2019 Relapse/Recurrence   FINAL MICROSCOPIC DIAGNOSIS:   A. PERITONEAL NODULES, BIOPSY:  -  Well-differentiated neuroendocrine tumor (carcinoid)  -  See comment   COMMENT:   By immunohistochemistry, the neoplastic cells are positive for CD56,  chromogranin and synaptophysin with a low proliferative rate by Ki-67  (less than 1%).  These findings are consistent with metastasis of the  patient's previously diagnosed small bowel carcinoid.  These findings  were discussed with Dr. Wilmer on October 30, 2019.    11/06/2019 -  Chemotherapy   First-line Monthly Sandostatin  injection starting 11/06/19. Held since 01/05/20 due to poor toleration ( hypoglycemia, fatigue, and the episode of syncope and fall right after the injection. The diarrhea and flushing has, but overall symptoms are mild)    05/03/2020 Imaging   CT CAP  IMPRESSION: 1. Stable exam. No new or progressive interval findings. 2. Scattered tiny lung nodules  are stable since PET-CT of 10/06/2019. These were hypermetabolic on that exam consistent  with metastatic disease. 3. Multiple small omental and mesenteric soft tissue nodules are similar to prior. These were also noted to be hypermetabolic on the PET study. 4. Status post ileocolectomy. 5. Left colonic diverticulosis without diverticulitis. 6. Aortic Atherosclerosis (ICD10-I70.0).     02/08/2021 Imaging   EXAM: CT CHEST, ABDOMEN, AND PELVIS WITH CONTRAST  IMPRESSION: 1. Three of the pulmonary nodules have intervally enlarged, suggesting mild progression of malignancy. The remaining pulmonary nodules in the omental nodules appear stable, and no definite new nodule is identified. 2. Other imaging findings of potential clinical significance: Aortic Atherosclerosis (ICD10-I70.0). Coronary atherosclerosis. Systemic atherosclerosis. Complex ventral supraumbilical hernia with multiple defects and lobulations. Lumbar spondylosis and degenerative disc disease   07/28/2021 Imaging   EXAM: CT CHEST, ABDOMEN, AND PELVIS WITH CONTRAST  IMPRESSION: 1. Slight increased size of several pulmonary nodules, as detailed above, concerning for mild progression of metastatic disease to the lungs. In addition, there are findings in the left lower lobe which could be of infectious or inflammatory etiology, although the possibility of developing lymphangitic spread of tumor in the left lower lobe is not excluded. Further clinical evaluation and attention on follow-up studies is recommended. 2. Multiple small soft tissue human plants in the peritoneal cavity appear very similar to the prior examination. No other new signs of metastatic disease noted elsewhere in the abdomen or pelvis. 3. Aortic atherosclerosis, in addition to left anterior descending coronary artery disease. Assessment for potential risk factor modification, dietary therapy or pharmacologic therapy may be warranted, if clinically  indicated. 4. Colonic diverticulosis without evidence of acute diverticulitis at this time. 5. Additional incidental findings, similar to prior study, as above.      Discussed the use of AI scribe software for clinical note transcription with the patient, who gave verbal consent to proceed.  History of Present Illness Jessica Farrell is a 78 year old female with metastatic neuroendocrine tumor who presents for follow-up.  She experiences significant fatigue and weakness following her third Lamisil  treatment, feeling more debilitated than after previous treatments. She is unable to work due to persistent weakness.  There is a marked decrease in appetite with early satiety, though she feels hungry. Nausea persists despite Odansetron. No abdominal cramps or bowel movement issues are present.  Fatigue and weakness are pronounced, with difficulty standing and talking due to leg weakness. No dizziness, blood pressure, or sugar issues are noted.     All other systems were reviewed with the patient and are negative.  MEDICAL HISTORY:  Past Medical History:  Diagnosis Date   Asthma    Cellulitis March  2014   Left foot and ankle   colon ca dx'd 01/2014   Colon polyps    2015    Diabetes mellitus (HCC) 05/05/2012   Type II   Hyperlipidemia    Morbid obesity (HCC)    Thyroid  disease 1990's   HypoThyroidism    SURGICAL HISTORY: Past Surgical History:  Procedure Laterality Date   CHOLECYSTECTOMY N/A 05/02/2017   Procedure: LAPAROSCOPIC CHOLECYSTECTOMY WITH INTRAOPERATIVE CHOLANGIOGRAM, VENTRAL HERNIA REPAIR;  Surgeon: Curvin Deward MOULD, MD;  Location: [facility];  Service: General;  Laterality: N/A;   COLON RESECTION N/A 02/03/2014   Procedure: LAPAROSCOPIC ASSISTED BOWEL RESECTION;  Surgeon: Krystal Russell, MD;  Location: [facility];  Service: General;  Laterality: N/A;   FRACTURE SURGERY  2000   Ankle   TOOTH EXTRACTION     wisdom Teeth    I have reviewed the social history and  family history  with the patient and they are unchanged from previous note.  ALLERGIES:  is allergic to penicillins.  MEDICATIONS:  Current Outpatient Medications  Medication Sig Dispense Refill   albuterol  (VENTOLIN  HFA) 108 (90 Base) MCG/ACT inhaler INHALE 2 PUFFS EVERY 6 HOURS AS NEEDED FOR WHEEZING OR SHORTNESS OF BREATH 54 g 3   atorvastatin  (LIPITOR) 10 MG tablet TAKE 1 TABLET EVERY DAY 90 tablet 3   cetirizine  (ZYRTEC ) 10 MG tablet Take 1 tablet (10 mg total) by mouth daily. 15 tablet 0   fluticasone -salmeterol (ADVAIR  DISKUS) 500-50 MCG/ACT AEPB Inhale 1 puff into the lungs in the morning and at bedtime. 180 each 3   glucose blood test strip Based on insurance preference. Use as instructed once daily to check sugars. Dx E11.9 100 each 12   Lancets (ONETOUCH ULTRASOFT) lancets Use as instructed once daily to check sugars. Dx E11.9 100 each 12   levothyroxine  (SYNTHROID ) 100 MCG tablet Take 1 tablet (100 mcg total) by mouth daily. 90 tablet 3   loperamide (IMODIUM) 1 MG/5ML solution Take by mouth as needed for diarrhea or loose stools.     montelukast  (SINGULAIR ) 10 MG tablet TAKE 1 TABLET AT BEDTIME 90 tablet 3   naproxen  (NAPROSYN ) 375 MG tablet Take 1 tablet (375 mg total) by mouth 2 (two) times daily. 20 tablet 0   ondansetron  (ZOFRAN ) 8 MG tablet Take 1 tablet (8 mg total) by mouth 2 (two) times daily as needed for nausea or vomiting. 20 tablet 0   ondansetron  (ZOFRAN ) 8 MG tablet Take 1 tablet (8 mg total) by mouth 2 (two) times daily as needed for nausea or vomiting. 20 tablet 0   Prenatal Vit-Fe Fumarate-FA (PRENATAL VITAMINS PO) Take by mouth.     Semaglutide  (RYBELSUS ) 7 MG TABS Take 1 tablet (7 mg total) by mouth daily. 90 tablet 1   No current facility-administered medications for this visit.    PHYSICAL EXAMINATION: ECOG PERFORMANCE STATUS: 2 - Symptomatic, <50% confined to bed  Vitals:   08/29/23 0838  BP: 122/60  Pulse: 96  Resp: 16  Temp: 98.3 F (36.8 C)   SpO2: 97%   Wt Readings from Last 3 Encounters:  08/29/23 70.4 kg (155 lb 1.6 oz)  05/22/23 69.4 kg (153 lb)  05/08/23 69.2 kg (152 lb 9.6 oz)     GENERAL:alert, no distress and comfortable SKIN: skin color, texture, turgor are normal, no rashes or significant lesions EYES: normal, Conjunctiva are pink and non-injected, sclera clear NECK: supple, thyroid  normal size, non-tender, without nodularity LYMPH:  no palpable lymphadenopathy in the cervical, axillary  LUNGS: clear to auscultation and percussion with normal breathing effort HEART: regular rate & rhythm and no murmurs and no lower extremity edema ABDOMEN:abdomen soft, non-tender and normal bowel sounds Musculoskeletal:no cyanosis of digits and no clubbing  NEURO: alert & oriented x 3 with fluent speech, no focal motor/sensory deficits  Physical Exam    LABORATORY DATA:  I have reviewed the data as listed    Latest Ref Rng & Units 08/29/2023    8:15 AM 08/16/2023    8:23 AM 06/21/2023    8:20 AM  CBC  WBC 4.0 - 10.5 K/uL 4.6  3.7  5.2   Hemoglobin 12.0 - 15.0 g/dL 89.2  89.1  89.2   Hematocrit 36.0 - 46.0 % 32.0  32.5  32.2   Platelets 150 - 400 K/uL 158  145  192         Latest Ref Rng & Units 08/29/2023  8:15 AM 08/16/2023    8:23 AM 06/21/2023    8:20 AM  CMP  Glucose 70 - 99 mg/dL 781  804  882   BUN 8 - 23 mg/dL 15  12  17    Creatinine 0.44 - 1.00 mg/dL 9.11  9.15  9.14   Sodium 135 - 145 mmol/L 142  142  140   Potassium 3.5 - 5.1 mmol/L 3.7  4.3  4.5   Chloride 98 - 111 mmol/L 107  107  108   CO2 22 - 32 mmol/L 30  28  29    Calcium  8.9 - 10.3 mg/dL 8.9  9.1  8.6   Total Protein 6.5 - 8.1 g/dL 6.3  6.7  6.3   Total Bilirubin 0.0 - 1.2 mg/dL 0.4  0.4  0.3   Alkaline Phos 38 - 126 U/L 105  110  104   AST 15 - 41 U/L 15  18  20    ALT 0 - 44 U/L 15  19  19        RADIOGRAPHIC STUDIES: I have personally reviewed the radiological images as listed and agreed with the findings in the report. No results found.     Orders Placed This Encounter  Procedures   NM PET DOTATATE SKULL BASE TO MID THIGH    Standing Status:   Future    Expected Date:   11/22/2023    Expiration Date:   08/28/2024    If indicated for the ordered procedure, I authorize the administration of a radiopharmaceutical per Radiology protocol:   Yes    Preferred imaging location?:   [facility]   All questions were answered. The patient knows to call the clinic with any problems, questions or concerns. No barriers to learning was detected. The total time spent in the appointment was 30 minutes, including review of chart and various tests results, discussions about plan of care and coordination of care plan     Onita Mattock, MD 08/29/2023

## 2023-08-31 LAB — CHROMOGRANIN A: Chromogranin A (ng/mL): 346.6 ng/mL — ABNORMAL HIGH (ref 0.0–101.8)

## 2023-10-10 ENCOUNTER — Other Ambulatory Visit: Payer: Self-pay | Admitting: Internal Medicine

## 2023-10-11 ENCOUNTER — Inpatient Hospital Stay: Attending: Hematology

## 2023-10-11 DIAGNOSIS — C7B8 Other secondary neuroendocrine tumors: Secondary | ICD-10-CM | POA: Insufficient documentation

## 2023-10-11 DIAGNOSIS — C7A8 Other malignant neuroendocrine tumors: Secondary | ICD-10-CM | POA: Insufficient documentation

## 2023-10-11 LAB — CBC WITH DIFFERENTIAL (CANCER CENTER ONLY)
Abs Immature Granulocytes: 0.01 K/uL (ref 0.00–0.07)
Basophils Absolute: 0 K/uL (ref 0.0–0.1)
Basophils Relative: 1 %
Eosinophils Absolute: 0.1 K/uL (ref 0.0–0.5)
Eosinophils Relative: 3 %
HCT: 32.4 % — ABNORMAL LOW (ref 36.0–46.0)
Hemoglobin: 10.8 g/dL — ABNORMAL LOW (ref 12.0–15.0)
Immature Granulocytes: 0 %
Lymphocytes Relative: 15 %
Lymphs Abs: 0.5 K/uL — ABNORMAL LOW (ref 0.7–4.0)
MCH: 30.3 pg (ref 26.0–34.0)
MCHC: 33.3 g/dL (ref 30.0–36.0)
MCV: 91 fL (ref 80.0–100.0)
Monocytes Absolute: 0.4 K/uL (ref 0.1–1.0)
Monocytes Relative: 10 %
Neutro Abs: 2.5 K/uL (ref 1.7–7.7)
Neutrophils Relative %: 71 %
Platelet Count: 151 K/uL (ref 150–400)
RBC: 3.56 MIL/uL — ABNORMAL LOW (ref 3.87–5.11)
RDW: 13.1 % (ref 11.5–15.5)
WBC Count: 3.5 K/uL — ABNORMAL LOW (ref 4.0–10.5)
nRBC: 0 % (ref 0.0–0.2)

## 2023-10-11 LAB — CMP (CANCER CENTER ONLY)
ALT: 16 U/L (ref 0–44)
AST: 20 U/L (ref 15–41)
Albumin: 3.8 g/dL (ref 3.5–5.0)
Alkaline Phosphatase: 108 U/L (ref 38–126)
Anion gap: 4 — ABNORMAL LOW (ref 5–15)
BUN: 16 mg/dL (ref 8–23)
CO2: 31 mmol/L (ref 22–32)
Calcium: 8.9 mg/dL (ref 8.9–10.3)
Chloride: 107 mmol/L (ref 98–111)
Creatinine: 0.82 mg/dL (ref 0.44–1.00)
GFR, Estimated: >60 mL/min (ref >60)
Glucose, Bld: 186 mg/dL — ABNORMAL HIGH (ref 70–99)
Potassium: 4 mmol/L (ref 3.5–5.1)
Sodium: 142 mmol/L (ref 135–145)
Total Bilirubin: 0.4 mg/dL (ref 0.0–1.2)
Total Protein: 6.6 g/dL (ref 6.5–8.1)

## 2023-10-17 NOTE — Written Directive (Cosign Needed)
 LUTATHERA  THERAPY   RADIOPHARMACEUTICAL:  Lutetium 177 Dotatate (Lutathera )     PRESCRIBED DOSE FOR ADMINISTRATION:  200 mCi   ROUTE OFADMINISTRATION:  IV   DIAGNOSIS:  metastatic malignant neuroendocrfine tumor to lymph node, 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    REFERRING PHYSICIAN: Lanny Callander, MD   TREATMENT # 4   DATE OF LAST LONG LIVED SOMATOSTATIN INJECTION:   ADDITIONAL PHYSICIAN COMMENTS/NOTES:   AUTHORIZED USER SIGNATURE & TIME STAMP:

## 2023-10-18 ENCOUNTER — Ambulatory Visit (HOSPITAL_COMMUNITY)
Admission: RE | Admit: 2023-10-18 | Discharge: 2023-10-18 | Disposition: A | Discharge status: Home / Self Care | Payer: Medicare HMO | Source: Ambulatory Visit | Attending: Hematology | Admitting: Hematology

## 2023-10-19 ENCOUNTER — Other Ambulatory Visit: Payer: Self-pay | Admitting: Internal Medicine

## 2023-10-19 ENCOUNTER — Ambulatory Visit: Payer: Self-pay | Admitting: Internal Medicine

## 2023-10-19 ENCOUNTER — Telehealth: Payer: Self-pay | Admitting: Internal Medicine

## 2023-10-19 MED ORDER — NIRMATRELVIR/RITONAVIR (PAXLOVID)TABLET: 3.0000 | ORAL_TABLET | Freq: Two times a day (BID) | ORAL | 0 refills | Status: AC

## 2023-10-19 NOTE — Telephone Encounter (Signed)
 This RN made second attempt to contact pt with no answer. This RN lvm with call back number provided.

## 2023-10-19 NOTE — Telephone Encounter (Signed)
 Duplicate message.  Spoke with patient already today and script has been sent.

## 2023-10-19 NOTE — Telephone Encounter (Signed)
   This RN made first attempt to contact pt with no answer. This RN lvm with call back number provided.         Garmon, Tanazia M to E2c2 Nurse Triage Pool - Primary Care (Selected Message) TG    10/19/23 10:50 AM Patient is requesting a call back from Dr. Geofm nurse [phone]. Aram Mercedes HERO TG   10/19/23 10:49 AM Unsigned Note Copied from CRM [id number]. Topic: Clinical - Medication Refill >> Oct 19, 2023 10:48 AM Tanazia G wrote: Medication: Paxlovid   Patient states she has covid and is requesting a refill on this medication.   Has the patient contacted their pharmacy? Yes (Agent: If no, request that the patient contact the pharmacy for the refill. If patient does not wish to contact the pharmacy document the reason why and proceed with request.) (Agent: If yes, when and what did the pharmacy advise?)   This is the patient's preferred pharmacy:  [facility] [address] ([address] [place] - [address] [place] ([place]) [place] [address] Phone: [phone] Fax: [phone]   Is this the correct pharmacy for this prescription? Yes If no, delete pharmacy and type the correct one.    Has the prescription been filled recently? Yes   Is the patient out of the medication? Yes   Has the patient been seen for an appointment in the last year OR does the patient have an upcoming appointment? Yes   Can we respond through MyChart? Yes   Agent: Please be advised that Rx refills may take up to 3 business days. We ask that you follow-up with your pharmacy.

## 2023-10-19 NOTE — Telephone Encounter (Signed)
 This RN made third attempt to contact pt with no answer. This RN left a voicemail with call back number provided.

## 2023-10-19 NOTE — Telephone Encounter (Signed)
 This RN called pt back with no answer. LVM with call back number.  Copied from CRM [id number]. Topic: General - Call Back - No Documentation >> Oct 19, 2023  1:41 PM Shereese L wrote: Reason for CRM: Patient called in to return office call to triage nurse and advised that she will be receiving a call back

## 2023-10-19 NOTE — Telephone Encounter (Signed)
 FYI Only or Action Required?: FYI only for provider.  Patient was last seen in primary care on 05/22/2023 by Geofm Glade PARAS, MD.  Called Nurse Triage reporting Covid Positive.  Symptoms began yesterday.   Triage Disposition: Information or Advice Only Call, No Contact Calls  Patient/caregiver understands and will follow disposition?: Yes Reason for Disposition . Health information question, no triage required and triager able to answer question  Answer Assessment - Initial Assessment Questions 1. REASON FOR CALL: What is the main reason for your call? or How can I best help you?    Pt calling to see if PCP called in Rx for COVID. Triager advised that PCP sent in Rx to [facility] pharmacy. Patient verbalized understanding and to call back with worsening symptoms.  2. SYMPTOMS : Do you have any symptoms?      COVID + yesterday 3. OTHER QUESTIONS: Do you have any other questions?     Denies.  Protocols used: Information Only Call - No Triage-A-AH

## 2023-10-19 NOTE — Telephone Encounter (Unsigned)
 Copied from CRM [id number]. Topic: Clinical - Medication Refill >> Oct 19, 2023 10:48 AM Tanazia G wrote: Medication: Paxlovid   Patient states she has covid and is requesting a refill on this medication.  Has the patient contacted their pharmacy? Yes (Agent: If no, request that the patient contact the pharmacy for the refill. If patient does not wish to contact the pharmacy document the reason why and proceed with request.) (Agent: If yes, when and what did the pharmacy advise?)  This is the patient's preferred pharmacy:  [facility] [address] ([address]), [place] - [address] [place] ([place]) [place] [address] Phone: [phone] Fax: [phone]  Is this the correct pharmacy for this prescription? Yes If no, delete pharmacy and type the correct one.   Has the prescription been filled recently? Yes  Is the patient out of the medication? Yes  Has the patient been seen for an appointment in the last year OR does the patient have an upcoming appointment? Yes  Can we respond through MyChart? Yes  Agent: Please be advised that Rx refills may take up to 3 business days. We ask that you follow-up with your pharmacy.

## 2023-10-19 NOTE — Progress Notes (Signed)
 Paxlovid sent to pharmacy

## 2023-10-30 ENCOUNTER — Other Ambulatory Visit: Payer: Self-pay

## 2023-10-30 DIAGNOSIS — C7B8 Other secondary neuroendocrine tumors: Secondary | ICD-10-CM

## 2023-11-06 ENCOUNTER — Inpatient Hospital Stay: Attending: Hematology

## 2023-11-06 ENCOUNTER — Encounter (HOSPITAL_COMMUNITY)

## 2023-11-06 DIAGNOSIS — C7A8 Other malignant neuroendocrine tumors: Secondary | ICD-10-CM | POA: Insufficient documentation

## 2023-11-06 DIAGNOSIS — C7B8 Other secondary neuroendocrine tumors: Secondary | ICD-10-CM

## 2023-11-06 LAB — CBC WITH DIFFERENTIAL (CANCER CENTER ONLY)
Abs Immature Granulocytes: 0.01 K/uL (ref 0.00–0.07)
Basophils Absolute: 0 K/uL (ref 0.0–0.1)
Basophils Relative: 1 %
Eosinophils Absolute: 0.1 K/uL (ref 0.0–0.5)
Eosinophils Relative: 2 %
HCT: 30.5 % — ABNORMAL LOW (ref 36.0–46.0)
Hemoglobin: 10.1 g/dL — ABNORMAL LOW (ref 12.0–15.0)
Immature Granulocytes: 0 %
Lymphocytes Relative: 11 %
Lymphs Abs: 0.6 K/uL — ABNORMAL LOW (ref 0.7–4.0)
MCH: 31 pg (ref 26.0–34.0)
MCHC: 33.1 g/dL (ref 30.0–36.0)
MCV: 93.6 fL (ref 80.0–100.0)
Monocytes Absolute: 0.5 K/uL (ref 0.1–1.0)
Monocytes Relative: 9 %
Neutro Abs: 4 K/uL (ref 1.7–7.7)
Neutrophils Relative %: 77 %
Platelet Count: 125 K/uL — ABNORMAL LOW (ref 150–400)
RBC: 3.26 MIL/uL — ABNORMAL LOW (ref 3.87–5.11)
RDW: 13.3 % (ref 11.5–15.5)
WBC Count: 5.2 K/uL (ref 4.0–10.5)
nRBC: 0 % (ref 0.0–0.2)

## 2023-11-06 LAB — CMP (CANCER CENTER ONLY)
ALT: 15 U/L (ref 0–44)
AST: 15 U/L (ref 15–41)
Albumin: 3.7 g/dL (ref 3.5–5.0)
Alkaline Phosphatase: 110 U/L (ref 38–126)
Anion gap: 5 (ref 5–15)
BUN: 16 mg/dL (ref 8–23)
CO2: 29 mmol/L (ref 22–32)
Calcium: 8.8 mg/dL — ABNORMAL LOW (ref 8.9–10.3)
Chloride: 106 mmol/L (ref 98–111)
Creatinine: 0.8 mg/dL (ref 0.44–1.00)
GFR, Estimated: >60 mL/min (ref >60)
Glucose, Bld: 109 mg/dL — ABNORMAL HIGH (ref 70–99)
Potassium: 4.3 mmol/L (ref 3.5–5.1)
Sodium: 140 mmol/L (ref 135–145)
Total Bilirubin: 0.3 mg/dL (ref 0.0–1.2)
Total Protein: 6.6 g/dL (ref 6.5–8.1)

## 2023-11-07 LAB — CHROMOGRANIN A: Chromogranin A (ng/mL): 300.3 ng/mL — ABNORMAL HIGH (ref 0.0–101.8)

## 2023-11-12 NOTE — Written Directive (Cosign Needed)
 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

## 2023-11-13 ENCOUNTER — Ambulatory Visit (HOSPITAL_COMMUNITY)
Admission: RE | Admit: 2023-11-13 | Discharge: 2023-11-13 | Disposition: A | Discharge status: Home / Self Care | Source: Ambulatory Visit | Attending: Hematology | Admitting: Hematology

## 2023-11-13 VITALS — BP 128/66 | HR 65

## 2023-11-13 DIAGNOSIS — C220 Liver cell carcinoma: Secondary | ICD-10-CM | POA: Diagnosis not present

## 2023-11-13 DIAGNOSIS — C7B8 Other secondary neuroendocrine tumors: Secondary | ICD-10-CM | POA: Diagnosis not present

## 2023-11-13 DIAGNOSIS — C7A8 Other malignant neuroendocrine tumors: Secondary | ICD-10-CM | POA: Diagnosis not present

## 2023-11-13 LAB — CBC WITH DIFFERENTIAL/PLATELET
Abs Immature Granulocytes: 0.02 K/uL (ref 0.00–0.07)
Basophils Absolute: 0 K/uL (ref 0.0–0.1)
Basophils Relative: 1 %
Eosinophils Absolute: 0.1 K/uL (ref 0.0–0.5)
Eosinophils Relative: 3 %
HCT: 32.1 % — ABNORMAL LOW (ref 36.0–46.0)
Hemoglobin: 10.4 g/dL — ABNORMAL LOW (ref 12.0–15.0)
Immature Granulocytes: 1 %
Lymphocytes Relative: 18 %
Lymphs Abs: 0.7 K/uL (ref 0.7–4.0)
MCH: 31.1 pg (ref 26.0–34.0)
MCHC: 32.4 g/dL (ref 30.0–36.0)
MCV: 96.1 fL (ref 80.0–100.0)
Monocytes Absolute: 0.4 K/uL (ref 0.1–1.0)
Monocytes Relative: 10 %
Neutro Abs: 2.6 K/uL (ref 1.7–7.7)
Neutrophils Relative %: 67 %
Platelets: 144 K/uL — ABNORMAL LOW (ref 150–400)
RBC: 3.34 MIL/uL — ABNORMAL LOW (ref 3.87–5.11)
RDW: 13.3 % (ref 11.5–15.5)
WBC: 3.9 K/uL — ABNORMAL LOW (ref 4.0–10.5)
nRBC: 0 % (ref 0.0–0.2)

## 2023-11-13 LAB — COMPREHENSIVE METABOLIC PANEL WITH GFR
ALT: 18 U/L (ref 0–44)
AST: 21 U/L (ref 15–41)
Albumin: 3.9 g/dL (ref 3.5–5.0)
Alkaline Phosphatase: 118 U/L (ref 38–126)
Anion gap: 12 (ref 5–15)
BUN: 17 mg/dL (ref 8–23)
CO2: 23 mmol/L (ref 22–32)
Calcium: 9.2 mg/dL (ref 8.9–10.3)
Chloride: 105 mmol/L (ref 98–111)
Creatinine, Ser: 0.9 mg/dL (ref 0.44–1.00)
GFR, Estimated: >60 mL/min (ref >60)
Glucose, Bld: 143 mg/dL — ABNORMAL HIGH (ref 70–99)
Potassium: 3.8 mmol/L (ref 3.5–5.1)
Sodium: 140 mmol/L (ref 135–145)
Total Bilirubin: 0.4 mg/dL (ref 0.0–1.2)
Total Protein: 6.6 g/dL (ref 6.5–8.1)

## 2023-11-13 MED ORDER — PROCHLORPERAZINE EDISYLATE 10 MG/2ML IJ SOLN: 10.0000 mg | Freq: Four times a day (QID) | INTRAMUSCULAR | Status: DC | PRN

## 2023-11-13 MED ORDER — ONDANSETRON HCL 8 MG PO TABS: 8.0000 mg | ORAL_TABLET | Freq: Two times a day (BID) | ORAL | 0 refills | Status: AC | PRN

## 2023-11-13 MED ORDER — OCTREOTIDE ACETATE 500 MCG/ML IJ SOLN: 500.0000 ug | Freq: Once | INTRAMUSCULAR | Status: DC | PRN

## 2023-11-13 MED ORDER — SODIUM CHLORIDE 0.9 % IV SOLN: 500.0000 mL | Freq: Once | INTRAVENOUS | Status: DC

## 2023-11-13 MED ORDER — ONDANSETRON 8 MG/NS 50 ML IVPB
8.0000 mg | Freq: Once | INTRAVENOUS | Status: AC
  Administered 2023-11-13: 8 mg via INTRAVENOUS
  Filled 2023-11-13: qty 8

## 2023-11-13 MED ORDER — AMINO ACID RADIOPROTECTANT - L-LYSINE 2.5%/L-ARGININE 2.5% IN NS
250.0000 mL/h | INTRAVENOUS | Status: AC
Start: 2023-11-13 — End: 2023-11-13
  Administered 2023-11-13: 250 mL/h via INTRAVENOUS
  Filled 2023-11-13: qty 1000

## 2023-11-13 MED ORDER — LUTETIUM LU 177 DOTATATE 370 MBQ/ML IV SOLN
200.0000 | Freq: Once | INTRAVENOUS | Status: AC
  Administered 2023-11-13: 202.6 via INTRAVENOUS

## 2023-11-13 NOTE — Progress Notes (Signed)
 Pt tolerated lutathera  treatment. All questions answered and understanding verbalized. Sandostatin  not given per MD Malva.

## 2023-11-13 NOTE — Progress Notes (Signed)
 CLINICAL DATA: [age] year[age]old [female] with metastatic neuroendocrine tumor. Well differentiated tumor with somatostatin receptor is identified within the [liver and pulmonary nodules as well as the skeleton]  by DOTATATE PET CT scan.    EXAM: NUCLEAR MEDICINE LUTATHERA  ADMINISTRATION  TECHNIQUE: Infusion: The nuclear medicine technologist and I personally verified the dose activity ([204] mCi) to be delivered as specified in the written directive (200 mCi), and verified the patient identification via 2 separate methods.  20 gauge IV were started in the antecubital veins. Anti-emetics were administered by nursing staff. Amino acid renal protection was initiated 30 minutes prior to Lu 177 DOTATATE (Lutathera ) infusion and continued continuously for 4 hours. Lutathera  infusion was administered over 30 minutes.      The total administered dose was [202.6] mCi Lu 177 DOTATATE.    The entire IV tubing, venocatheter, stopcock and syringes was removed in total, placed in a disposal bag and sent for assay of the residual activity, which will be reported at a later time in our EMR by the physics staff. Pressure was applied to the venipuncture sites, and a compression bandage placed. Radiation Safety personnel were present to perform the discharge survey, as detailed on their documentation.    Patient received 30 mg IM long-acting Sandostatin  injection 4 hours after Lutathera  effusion in the nuclear medicine department.   RADIOPHARMACEUTICALS:   [202.6] mCi Lu 177 DOTATATE   FINDINGS: Diagnosis: [Metastatic neuroendocrine tumor.]    Current Infusion: [4]    Planned Infusions: [4]    Patient reports fatigue following third treatment.  Fatigue potentially related to treatment versus some other viral illness.  Patient recovered from COVID in the interval.    The patient's most recent blood counts were reviewed and remains a good candidate to proceed with Lutathera . The  patient was situated in an infusion suite and administered Lutathera  as above. Patient will follow-up with referring oncologist for interval serum laboratories (CBC and CMP) in approximately 4 weeks.           IMPRESSION: [Fourth] Lu 177 DOTATATE treatment for metastatic neuroendocrine tumor.  Final treatment.  The patient tolerated the infusion well. The patient will return to oncology care.  Follow-up DOTATATE PET scan in 6 to 8 weeks.

## 2023-11-20 NOTE — Patient Instructions (Incomplete)
     Flu immunization administered today.      Blood work was ordered.       Medications changes include :   None      Return in about 6 months (around 05/21/2024) for follow up.

## 2023-11-20 NOTE — Progress Notes (Unsigned)
 Subjective:    Patient ID: Jessica Farrell, female    DOB: May 19, 1945, 78 y.o.   MRN: [id number]     HPI Jessica Farrell is here for follow up of her chronic medical problems.  Due for DEXA-[facility]   Has had 4 Lu 177 DOTATATE treatment for metastatic neuroendocrine tumor.  PET scan scheduled for tomorrow  Medications and allergies reviewed with patient and updated if appropriate.  Current Outpatient Medications on File Prior to Visit  Medication Sig Dispense Refill   albuterol  (VENTOLIN  HFA) 108 (90 Base) MCG/ACT inhaler INHALE 2 PUFFS EVERY 6 HOURS AS NEEDED FOR WHEEZING OR SHORTNESS OF BREATH 54 g 3   atorvastatin  (LIPITOR) 10 MG tablet TAKE 1 TABLET EVERY DAY 90 tablet 3   cetirizine  (ZYRTEC ) 10 MG tablet Take 1 tablet (10 mg total) by mouth daily. 15 tablet 0   fluticasone -salmeterol (ADVAIR  DISKUS) 500-50 MCG/ACT AEPB Inhale 1 puff into the lungs in the morning and at bedtime. 180 each 3   glucose blood test strip Based on insurance preference. Use as instructed once daily to check sugars. Dx E11.9 100 each 12   Lancets (ONETOUCH ULTRASOFT) lancets Use as instructed once daily to check sugars. Dx E11.9 100 each 12   levothyroxine  (SYNTHROID ) 100 MCG tablet Take 1 tablet (100 mcg total) by mouth daily. 90 tablet 3   loperamide (IMODIUM) 1 MG/5ML solution Take by mouth as needed for diarrhea or loose stools.     montelukast  (SINGULAIR ) 10 MG tablet TAKE 1 TABLET AT BEDTIME 90 tablet 3   naproxen  (NAPROSYN ) 375 MG tablet Take 1 tablet (375 mg total) by mouth 2 (two) times daily. 20 tablet 0   ondansetron  (ZOFRAN ) 8 MG tablet Take 1 tablet (8 mg total) by mouth 2 (two) times daily as needed for nausea or vomiting. 20 tablet 0   ondansetron  (ZOFRAN ) 8 MG tablet Take 1 tablet (8 mg total) by mouth 2 (two) times daily as needed for nausea or vomiting. 20 tablet 0   ondansetron  (ZOFRAN ) 8 MG tablet Take 1 tablet (8 mg total) by mouth 2 (two) times daily as needed for  nausea or vomiting. 20 tablet 0   Prenatal Vit-Fe Fumarate-FA (PRENATAL VITAMINS PO) Take by mouth.     Semaglutide  (RYBELSUS ) 7 MG TABS Take 1 tablet (7 mg total) by mouth daily. 90 tablet 1   No current facility-administered medications on file prior to visit.     Review of Systems     Objective:  There were no vitals filed for this visit. BP Readings from Last 3 Encounters:  11/13/23 128/66  08/29/23 122/60  08/23/23 117/69   Wt Readings from Last 3 Encounters:  08/29/23 155 lb 1.6 oz (70.4 kg)  05/22/23 153 lb (69.4 kg)  05/08/23 152 lb 9.6 oz (69.2 kg)   There is no height or weight on file to calculate BMI.    Physical Exam     Lab Results  Component Value Date   WBC 3.9 (L) 11/13/2023   HGB 10.4 (L) 11/13/2023   HCT 32.1 (L) 11/13/2023   PLT 144 (L) 11/13/2023   GLUCOSE 143 (H) 11/13/2023   CHOL 127 05/22/2023   TRIG 89.0 05/22/2023   HDL 53.80 05/22/2023   LDLDIRECT 64.0 04/22/2020   LDLCALC 55 05/22/2023   ALT 18 11/13/2023   AST 21 11/13/2023   NA 140 11/13/2023   K 3.8 11/13/2023   CL 105 11/13/2023   CREATININE 0.90 11/13/2023  BUN 17 11/13/2023   CO2 23 11/13/2023   TSH 4.31 08/16/2023   INR 0.9 10/28/2019   HGBA1C 7.0 (H) 05/22/2023   MICROALBUR <0.7 05/22/2023     Assessment & Plan:    See Problem List for Assessment and Plan of chronic medical problems.

## 2023-11-21 ENCOUNTER — Ambulatory Visit (INDEPENDENT_AMBULATORY_CARE_PROVIDER_SITE_OTHER): Admitting: Internal Medicine

## 2023-11-21 ENCOUNTER — Encounter: Payer: Self-pay | Admitting: Internal Medicine

## 2023-11-21 VITALS — BP 126/68 | HR 79 | Temp 98.0°F | Ht 65.0 in | Wt 149.0 lb

## 2023-11-21 DIAGNOSIS — E1169 Type 2 diabetes mellitus with other specified complication: Secondary | ICD-10-CM | POA: Diagnosis not present

## 2023-11-21 DIAGNOSIS — Z7984 Long term (current) use of oral hypoglycemic drugs: Secondary | ICD-10-CM

## 2023-11-21 DIAGNOSIS — J4520 Mild intermittent asthma, uncomplicated: Secondary | ICD-10-CM | POA: Diagnosis not present

## 2023-11-21 DIAGNOSIS — E038 Other specified hypothyroidism: Secondary | ICD-10-CM

## 2023-11-21 DIAGNOSIS — E785 Hyperlipidemia, unspecified: Secondary | ICD-10-CM

## 2023-11-21 DIAGNOSIS — Z23 Encounter for immunization: Secondary | ICD-10-CM

## 2023-11-21 DIAGNOSIS — D649 Anemia, unspecified: Secondary | ICD-10-CM

## 2023-11-21 DIAGNOSIS — M8589 Other specified disorders of bone density and structure, multiple sites: Secondary | ICD-10-CM | POA: Diagnosis not present

## 2023-11-21 LAB — POCT GLYCOSYLATED HEMOGLOBIN (HGB A1C)
HbA1c POC (<> result, manual entry): 5.8 % (ref 4.0–5.6)
HbA1c, POC (controlled diabetic range): 5.8 % (ref 0.0–7.0)
HbA1c, POC (prediabetic range): 5.8 % (ref 5.7–6.4)
Hemoglobin A1C: 5.8 % — AB (ref 4.0–5.6)

## 2023-11-21 MED ORDER — LEVOTHYROXINE SODIUM 100 MCG PO TABS: 100.0000 ug | ORAL_TABLET | Freq: Every day | ORAL | 3 refills | Status: AC

## 2023-11-21 NOTE — Assessment & Plan Note (Addendum)
 Chronic Stable Monitored by oncology

## 2023-11-21 NOTE — Assessment & Plan Note (Addendum)
 Chronic Regular exercise and healthy diet encouraged Will hold off on blood work Continue atorvastatin  10 mg daily

## 2023-11-21 NOTE — Assessment & Plan Note (Addendum)
 Chronic  Clinically euthyroid Last TSH in normal range Continue levothyroxine  100 mcg daily

## 2023-11-21 NOTE — Assessment & Plan Note (Signed)
 Chronic Controlled Continue Advair 500-50 mcg per ACT once -twice daily - sometimes forgets it in evening Continue albuterol as needed

## 2023-11-21 NOTE — Assessment & Plan Note (Addendum)
 Chronic Associated with hyperlipidemia  Lab Results  Component Value Date   HGBA1C 5.8 (A) 11/21/2023   HGBA1C 5.8 11/21/2023   HGBA1C 5.8 11/21/2023   HGBA1C 5.8 11/21/2023   Sugars controlled-improved Check A1c today Continue Rybelsus  7 mg daily Stressed regular exercise, diabetic diet

## 2023-11-21 NOTE — Assessment & Plan Note (Signed)
 Chronic Defer DEXA for now-Will address in 6 months

## 2023-11-22 ENCOUNTER — Inpatient Hospital Stay: Attending: Hematology

## 2023-11-22 ENCOUNTER — Encounter (HOSPITAL_COMMUNITY)
Admission: RE | Admit: 2023-11-22 | Discharge: 2023-11-22 | Disposition: A | Discharge status: Home / Self Care | Source: Ambulatory Visit | Attending: Hematology | Admitting: Hematology

## 2023-11-22 DIAGNOSIS — R197 Diarrhea, unspecified: Secondary | ICD-10-CM | POA: Insufficient documentation

## 2023-11-22 DIAGNOSIS — C7A8 Other malignant neuroendocrine tumors: Secondary | ICD-10-CM | POA: Diagnosis present

## 2023-11-22 DIAGNOSIS — C7B8 Other secondary neuroendocrine tumors: Secondary | ICD-10-CM | POA: Insufficient documentation

## 2023-11-22 DIAGNOSIS — R918 Other nonspecific abnormal finding of lung field: Secondary | ICD-10-CM | POA: Diagnosis not present

## 2023-11-22 DIAGNOSIS — C786 Secondary malignant neoplasm of retroperitoneum and peritoneum: Secondary | ICD-10-CM | POA: Diagnosis not present

## 2023-11-22 LAB — CBC WITH DIFFERENTIAL (CANCER CENTER ONLY)
Abs Immature Granulocytes: 0.02 K/uL (ref 0.00–0.07)
Basophils Absolute: 0 K/uL (ref 0.0–0.1)
Basophils Relative: 1 %
Eosinophils Absolute: 0.1 K/uL (ref 0.0–0.5)
Eosinophils Relative: 3 %
HCT: 31.8 % — ABNORMAL LOW (ref 36.0–46.0)
Hemoglobin: 10.9 g/dL — ABNORMAL LOW (ref 12.0–15.0)
Immature Granulocytes: 1 %
Lymphocytes Relative: 8 %
Lymphs Abs: 0.3 K/uL — ABNORMAL LOW (ref 0.7–4.0)
MCH: 31.5 pg (ref 26.0–34.0)
MCHC: 34.3 g/dL (ref 30.0–36.0)
MCV: 91.9 fL (ref 80.0–100.0)
Monocytes Absolute: 0.3 K/uL (ref 0.1–1.0)
Monocytes Relative: 7 %
Neutro Abs: 3.4 K/uL (ref 1.7–7.7)
Neutrophils Relative %: 80 %
Platelet Count: 160 K/uL (ref 150–400)
RBC: 3.46 MIL/uL — ABNORMAL LOW (ref 3.87–5.11)
RDW: 13.5 % (ref 11.5–15.5)
WBC Count: 4.3 K/uL (ref 4.0–10.5)
nRBC: 0 % (ref 0.0–0.2)

## 2023-11-22 LAB — CMP (CANCER CENTER ONLY)
ALT: 13 U/L (ref 0–44)
AST: 16 U/L (ref 15–41)
Albumin: 3.8 g/dL (ref 3.5–5.0)
Alkaline Phosphatase: 98 U/L (ref 38–126)
Anion gap: 6 (ref 5–15)
BUN: 15 mg/dL (ref 8–23)
CO2: 28 mmol/L (ref 22–32)
Calcium: 9.2 mg/dL (ref 8.9–10.3)
Chloride: 108 mmol/L (ref 98–111)
Creatinine: 0.89 mg/dL (ref 0.44–1.00)
GFR, Estimated: >60 mL/min (ref >60)
Glucose, Bld: 166 mg/dL — ABNORMAL HIGH (ref 70–99)
Potassium: 3.5 mmol/L (ref 3.5–5.1)
Sodium: 142 mmol/L (ref 135–145)
Total Bilirubin: 0.3 mg/dL (ref 0.0–1.2)
Total Protein: 7 g/dL (ref 6.5–8.1)

## 2023-11-22 MED ORDER — COPPER CU 64 DOTATATE 1 MCI/ML IV SOLN
4.0000 | Freq: Once | INTRAVENOUS | Status: AC
  Administered 2023-11-22: 4.22 via INTRAVENOUS

## 2023-11-28 NOTE — Assessment & Plan Note (Signed)
 pT3 N1 M0, stage IIIB, lung, nodes and peritoneal metastasis 09/2019 -diagnosed 01/2014, s/p resection 02/03/14 showing 3 cm NET involving serosal tissue. Margins negative, but 5/25 positive nodes with extracapsular extension. -10/06/19 Dotatate PET showed metastatic disease to peritoneum, abdominal and mediastinal lymph nodes, and lungs. Indeterminate liver lesions. 10/29/19 peritoneal biopsy confirmed well-differentiated neuroendocrine tumor. -she took Sandostatin  injections monthly 11/06/19 - 01/05/20, discontinued due to hyperglycemia, fatigue, and an episode of syncope and fall right after the injection. -for symptom management, she took one dose lanreotide on 02/24/21. This improved her diarrhea, but she developed vision issues in her right eye, and stopped -restaging CT CAP 02/08/2022 showed stable to slightly increased pulmonary nodules and peritoneal mets; otherwise no new signs of metastatic disease. Will continue to monitor given poor tolerance of prior treatment. -we discussed alternative therapy with oral Afinitor if she has disease progression. will continue observation.  Given her overall low disease burden, mild symptoms, will plan to continue observation. -She was seen in the emergency room on July 26, 2022 for chest pain, CT chest with contrast was obtained during her visit, which showed negative PE, stable lung nodules, no other acute findings.  I personally reviewed and discussed with patient. -Restaging CT 09/20/22 showed mild progression of pulmonary and peritoneal metastasis, stable RP nodes. Plan to continue monitoring -due to disease progression on CT in 02/2023, confirmed by dotatate PET scan in February 2025, patient started Lutathera  treatment on May 03, 2023 and completed 11/13/2023 -repeated Dotatate PET 11/22/23 showed mild improvement

## 2023-11-29 ENCOUNTER — Encounter: Payer: Self-pay | Admitting: Hematology

## 2023-11-29 ENCOUNTER — Inpatient Hospital Stay: Admitting: Hematology

## 2023-11-29 VITALS — BP 126/64 | HR 82 | Temp 98.2°F | Resp 15 | Ht 65.0 in | Wt 157.8 lb

## 2023-11-29 DIAGNOSIS — R197 Diarrhea, unspecified: Secondary | ICD-10-CM | POA: Diagnosis not present

## 2023-11-29 DIAGNOSIS — C7A8 Other malignant neuroendocrine tumors: Secondary | ICD-10-CM | POA: Diagnosis not present

## 2023-11-29 DIAGNOSIS — C7B8 Other secondary neuroendocrine tumors: Secondary | ICD-10-CM

## 2023-11-29 NOTE — Progress Notes (Signed)
 [facility]   [phone] Fax:([phone]) [phone]   Clinic Follow up Note   Patient Care Team: Geofm Glade PARAS, MD as PCP - General (Internal Medicine) Lonni Slain, MD as PCP - Cardiology (Cardiology) Lanny Callander, MD as Consulting Physician (Hematology) Lily Boas, MD as Consulting Physician (General Surgery) Szabat, Toribio BROCKS, [facility] (Inactive) as Pharmacist (Pharmacist) Maree Lonni Inks, MD as Consulting Physician (Ophthalmology)  Date of Service:  11/29/2023  CHIEF COMPLAINT: f/u of neuroendocrine tumor  CURRENT THERAPY:  Observation  Oncology History   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 (HCC) pT3 N1 M0, stage IIIB, lung, nodes and peritoneal metastasis 09/2019 -diagnosed 01/2014, s/p resection 02/03/14 showing 3 cm NET involving serosal tissue. Margins negative, but 5/25 positive nodes with extracapsular extension. -10/06/19 Dotatate PET showed metastatic disease to peritoneum, abdominal and mediastinal lymph nodes, and lungs. Indeterminate liver lesions. 10/29/19 peritoneal biopsy confirmed well-differentiated neuroendocrine tumor. -she took Sandostatin  injections monthly 11/06/19 - 01/05/20, discontinued due to hyperglycemia, fatigue, and an episode of syncope and fall right after the injection. -for symptom management, she took one dose lanreotide on 02/24/21. This improved her diarrhea, but she developed vision issues in her right eye, and stopped -restaging CT CAP 02/08/2022 showed stable to slightly increased pulmonary nodules and peritoneal mets; otherwise no new signs of metastatic disease. Will continue to monitor given poor tolerance of prior treatment. -we discussed alternative therapy with oral Afinitor if she has disease progression. will continue observation.  Given her overall low disease burden, mild symptoms, will plan to continue observation. -She was seen in the emergency room on July 26, 2022 for chest pain, CT  chest with contrast was obtained during her visit, which showed negative PE, stable lung nodules, no other acute findings.  I personally reviewed and discussed with patient. -Restaging CT 09/20/22 showed mild progression of pulmonary and peritoneal metastasis, stable RP nodes. Plan to continue monitoring -due to disease progression on CT in 02/2023, confirmed by dotatate PET scan in February 2025, patient started Lutathera  treatment on May 03, 2023 and completed 11/13/2023 -repeated Dotatate PET 11/22/23 showed mild improvement   Assessment & Plan Metastatic neuroendocrine tumor Completed Lutathera  treatment with significant fatigue, especially during the last two treatments, impacting work ability. PET scan on November 22, 2023, shows slight improvement or stability in tumor size and activity compared to February 2025, with a mild interval decrease in activity level. No new lesions observed. 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  Plan - Patient has completed planned 4 treatments of Lutathera , tolerated well overall except for moderate fatigue - I reviewed her restaging dotatate PET scan from last week, which showed mild improvement, no new lesions - Continue surveillance, follow-up in 3 months.  Plan to repeat a scan in 6 months.   SUMMARY OF ONCOLOGIC HISTORY: Oncology History Overview Note  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   Staging form: Colon and Rectum, AJCC 7th Edition     Clinical: T3, N2, M0 - Unsigned     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 (HCC)  01/21/2014 Imaging   CT: Distal small bowel obstruction due to 3.4 cm soft tissue mass near the ileocecal valve,  with adjacent right lower quadrant mesenteric lymphadenopathy  02/03/2014 Pathologic Stage   invasive well diff neuroendocrine tumor (carcinoid), 3cm, pT3pN2 (5/25 nodes positive). negative margines.   02/03/2014 Surgery   Laparoscopic-assisted right colectomy and resection of distal ileum   02/03/2014 Initial Diagnosis   Metastatic malignant neuroendocrine tumor of the ileocecal valve to regional lymph nodes.    05/31/2015 Imaging   CT A/P w contrast  IMPRESSION: 1. No evidence of metastatic disease. 2. Question mild basilar subpleural pulmonary fibrosis.   09/11/2019 Imaging   CT AP W contrast    IMPRESSION: 1. Redemonstrated postoperative findings of ileocolectomy and reanastomosis. 2. There are prominent lymph nodes in the right mesocolon which are slightly increased in size compared to prior examination, the largest node measuring 1.8 x 0.7 cm, previously 1.5 x 0.4 cm. Findings are nonspecific although concerning for nodal metastatic disease. 3. There are additional new small nodules of the transverse mesocolon or omentum measuring up to 9 mm, likewise concerning for nodal or omental metastatic disease. 4. Redemonstrated broad-based midline, fat containing ventral hernia components. 5. Fluid in the endometrial cavity, abnormal in the late postmenopausal setting although unchanged compared to prior examination. 6. Status post interval cholecystectomy. 7. Aortic Atherosclerosis (ICD10-I70.0).   10/06/2019 PET scan   IMPRESSION: 1. Several small nodules in the peritoneal space and retroperitoneal space with intense radiotracer activity consistent with metastatic well differentiated neuroendocrine tumor. 2. Several small liver lesions above background activity are indeterminate. Consider MRI liver with and without contrast. 3. Radiotracer activity within mediastinal lymph nodes and bilateral pulmonary nodules consistent with metastatic well-differentiated neuroendocrine  tumor to the lungs. Activity is very low within these lesions but measurable. 4. No clear evidence skeletal metastasis. Single lesion in the posterior LEFT SI joint warrants attention on follow-up. 5. Although there are multiple sites of metastatic disease, the overall tumor burden of metastatic disease very small.   10/28/2019 Relapse/Recurrence   FINAL MICROSCOPIC DIAGNOSIS:   A. PERITONEAL NODULES, BIOPSY:  -  Well-differentiated neuroendocrine tumor (carcinoid)  -  See comment   COMMENT:   By immunohistochemistry, the neoplastic cells are positive for CD56,  chromogranin and synaptophysin with a low proliferative rate by Ki-67  (less than 1%).  These findings are consistent with metastasis of the  patient's previously diagnosed small bowel carcinoid.  These findings  were discussed with Dr. Wilmer on October 30, 2019.    11/06/2019 -  Chemotherapy   First-line Monthly Sandostatin  injection starting 11/06/19. Held since 01/05/20 due to poor toleration ( hypoglycemia, fatigue, and the episode of syncope and fall right after the injection. The diarrhea and flushing has, but overall symptoms are mild)    05/03/2020 Imaging   CT CAP  IMPRESSION: 1. Stable exam. No new or progressive interval findings. 2. Scattered tiny lung nodules are stable since PET-CT of 10/06/2019. These were hypermetabolic on that exam consistent with metastatic disease. 3. Multiple small omental and mesenteric soft tissue nodules are similar to prior. These were also noted to be hypermetabolic on the PET study. 4. Status post ileocolectomy. 5. Left colonic diverticulosis without diverticulitis. 6. Aortic Atherosclerosis (ICD10-I70.0).     02/08/2021 Imaging   EXAM: CT CHEST, ABDOMEN, AND PELVIS WITH CONTRAST  IMPRESSION: 1. Three of the pulmonary nodules have intervally enlarged, suggesting mild progression of malignancy. The remaining pulmonary nodules in the omental nodules appear stable, and no  definite new nodule is identified. 2. Other imaging findings of potential clinical significance: Aortic Atherosclerosis (ICD10-I70.0). Coronary atherosclerosis. Systemic atherosclerosis. Complex ventral supraumbilical hernia with multiple defects and lobulations.  Lumbar spondylosis and degenerative disc disease   07/28/2021 Imaging   EXAM: CT CHEST, ABDOMEN, AND PELVIS WITH CONTRAST  IMPRESSION: 1. Slight increased size of several pulmonary nodules, as detailed above, concerning for mild progression of metastatic disease to the lungs. In addition, there are findings in the left lower lobe which could be of infectious or inflammatory etiology, although the possibility of developing lymphangitic spread of tumor in the left lower lobe is not excluded. Further clinical evaluation and attention on follow-up studies is recommended. 2. Multiple small soft tissue human plants in the peritoneal cavity appear very similar to the prior examination. No other new signs of metastatic disease noted elsewhere in the abdomen or pelvis. 3. Aortic atherosclerosis, in addition to left anterior descending coronary artery disease. Assessment for potential risk factor modification, dietary therapy or pharmacologic therapy may be warranted, if clinically indicated. 4. Colonic diverticulosis without evidence of acute diverticulitis at this time. 5. Additional incidental findings, similar to prior study, as above.      Discussed the use of AI scribe software for clinical note transcription with the patient, who gave verbal consent to proceed.  History of Present Illness Brealyn Baril is a 78 year old female with metastatic neuroendocrine tumor who presents for follow-up after completing Lutathera  treatment.  She completed her Lutathera  treatment on November 13, 2023, experiencing significant fatigue, particularly after the last two sessions. This fatigue was severe enough to prevent her from  working for two weeks following these treatments. Currently, she is able to work and walk without issues. She experienced some diarrhea, which has recently improved, allowing her to reduce her use of Imodium. No current pain or discomfort is reported.     All other systems were reviewed with the patient and are negative.  MEDICAL HISTORY:  Past Medical History:  Diagnosis Date   Asthma    Cellulitis March  2014   Left foot and ankle   colon ca dx'd 01/2014   Colon polyps    2015    Diabetes mellitus (HCC) 05/05/2012   Type II   Hyperlipidemia    Morbid obesity (HCC)    Thyroid  disease 1990's   HypoThyroidism    SURGICAL HISTORY: Past Surgical History:  Procedure Laterality Date   CHOLECYSTECTOMY N/A 05/02/2017   Procedure: LAPAROSCOPIC CHOLECYSTECTOMY WITH INTRAOPERATIVE CHOLANGIOGRAM, VENTRAL HERNIA REPAIR;  Surgeon: Curvin Deward MOULD, MD;  Location: [facility];  Service: General;  Laterality: N/A;   COLON RESECTION N/A 02/03/2014   Procedure: LAPAROSCOPIC ASSISTED BOWEL RESECTION;  Surgeon: Krystal Russell, MD;  Location: [facility];  Service: General;  Laterality: N/A;   FRACTURE SURGERY  2000   Ankle   TOOTH EXTRACTION     wisdom Teeth    I have reviewed the social history and family history with the patient and they are unchanged from previous note.  ALLERGIES:  is allergic to penicillins.  MEDICATIONS:  Current Outpatient Medications  Medication Sig Dispense Refill   albuterol  (VENTOLIN  HFA) 108 (90 Base) MCG/ACT inhaler INHALE 2 PUFFS EVERY 6 HOURS AS NEEDED FOR WHEEZING OR SHORTNESS OF BREATH 54 g 3   atorvastatin  (LIPITOR) 10 MG tablet TAKE 1 TABLET EVERY DAY 90 tablet 3   cetirizine  (ZYRTEC ) 10 MG tablet Take 1 tablet (10 mg total) by mouth daily. 15 tablet 0   fluticasone -salmeterol (ADVAIR  DISKUS) 500-50 MCG/ACT AEPB Inhale 1 puff into the lungs in the morning and at bedtime. 180 each 3   glucose blood test strip Based on insurance preference. Use  as instructed once  daily to check sugars. Dx E11.9 100 each 12   Lancets (ONETOUCH ULTRASOFT) lancets Use as instructed once daily to check sugars. Dx E11.9 100 each 12   levothyroxine  (SYNTHROID ) 100 MCG tablet Take 1 tablet (100 mcg total) by mouth daily. 90 tablet 3   loperamide (IMODIUM) 1 MG/5ML solution Take by mouth as needed for diarrhea or loose stools.     montelukast  (SINGULAIR ) 10 MG tablet TAKE 1 TABLET AT BEDTIME 90 tablet 3   naproxen  (NAPROSYN ) 375 MG tablet Take 1 tablet (375 mg total) by mouth 2 (two) times daily. 20 tablet 0   ondansetron  (ZOFRAN ) 8 MG tablet Take 1 tablet (8 mg total) by mouth 2 (two) times daily as needed for nausea or vomiting. 20 tablet 0   ondansetron  (ZOFRAN ) 8 MG tablet Take 1 tablet (8 mg total) by mouth 2 (two) times daily as needed for nausea or vomiting. 20 tablet 0   ondansetron  (ZOFRAN ) 8 MG tablet Take 1 tablet (8 mg total) by mouth 2 (two) times daily as needed for nausea or vomiting. 20 tablet 0   Prenatal Vit-Fe Fumarate-FA (PRENATAL VITAMINS PO) Take by mouth.     Semaglutide  (RYBELSUS ) 7 MG TABS Take 1 tablet (7 mg total) by mouth daily. 90 tablet 1   No current facility-administered medications for this visit.    PHYSICAL EXAMINATION: ECOG PERFORMANCE STATUS: 1 - Symptomatic but completely ambulatory  Vitals:   11/29/23 0833  BP: 126/64  Pulse: 82  Resp: 15  Temp: 98.2 F (36.8 C)  SpO2: 98%   Wt Readings from Last 3 Encounters:  11/29/23 157 lb 12.8 oz (71.6 kg)  11/21/23 149 lb (67.6 kg)  08/29/23 155 lb 1.6 oz (70.4 kg)     GENERAL:alert, no distress and comfortable SKIN: skin color, texture, turgor are normal, no rashes or significant lesions EYES: normal, Conjunctiva are pink and non-injected, sclera clear NECK: supple, thyroid  normal size, non-tender, without nodularity LYMPH:  no palpable lymphadenopathy in the cervical, axillary  LUNGS: clear to auscultation and percussion with normal breathing effort HEART: regular rate & rhythm and  no murmurs and no lower extremity edema ABDOMEN:abdomen soft, non-tender and normal bowel sounds Musculoskeletal:no cyanosis of digits and no clubbing  NEURO: alert & oriented x 3 with fluent speech, no focal motor/sensory deficits  Physical Exam    LABORATORY DATA:  I have reviewed the data as listed    Latest Ref Rng & Units 11/22/2023    8:22 AM 11/13/2023    8:30 AM 11/06/2023    8:33 AM  CBC  WBC 4.0 - 10.5 K/uL 4.3  3.9  5.2   Hemoglobin 12.0 - 15.0 g/dL 89.0  89.5  89.8   Hematocrit 36.0 - 46.0 % 31.8  32.1  30.5   Platelets 150 - 400 K/uL 160  144  125         Latest Ref Rng & Units 11/22/2023    8:22 AM 11/13/2023    8:30 AM 11/06/2023    8:33 AM  CMP  Glucose 70 - 99 mg/dL 833  856  890   BUN 8 - 23 mg/dL 15  17  16    Creatinine 0.44 - 1.00 mg/dL 9.10  9.09  9.19   Sodium 135 - 145 mmol/L 142  140  140   Potassium 3.5 - 5.1 mmol/L 3.5  3.8  4.3   Chloride 98 - 111 mmol/L 108  105  106   CO2 22 -  32 mmol/L 28  23  29    Calcium  8.9 - 10.3 mg/dL 9.2  9.2  8.8   Total Protein 6.5 - 8.1 g/dL 7.0  6.6  6.6   Total Bilirubin 0.0 - 1.2 mg/dL 0.3  0.4  0.3   Alkaline Phos 38 - 126 U/L 98  118  110   AST 15 - 41 U/L 16  21  15    ALT 0 - 44 U/L 13  18  15        RADIOGRAPHIC STUDIES: I have personally reviewed the radiological images as listed and agreed with the findings in the report. No results found.    No orders of the defined types were placed in this encounter.  All questions were answered. The patient knows to call the clinic with any problems, questions or concerns. No barriers to learning was detected. The total time spent in the appointment was 30 minutes, including review of chart and various tests results, discussions about plan of care and coordination of care plan     Onita Mattock, MD 11/29/2023

## 2023-12-12 ENCOUNTER — Other Ambulatory Visit: Payer: Self-pay | Admitting: Internal Medicine

## 2023-12-12 DIAGNOSIS — E1165 Type 2 diabetes mellitus with hyperglycemia: Secondary | ICD-10-CM

## 2024-01-04 ENCOUNTER — Ambulatory Visit (INDEPENDENT_AMBULATORY_CARE_PROVIDER_SITE_OTHER)

## 2024-01-04 VITALS — BP 128/78 | HR 81 | Ht 64.0 in | Wt 148.6 lb

## 2024-01-04 DIAGNOSIS — Z0000 Encounter for general adult medical examination without abnormal findings: Secondary | ICD-10-CM

## 2024-01-04 NOTE — Progress Notes (Signed)
 Chief Complaint  Patient presents with   Medicare Wellness     Subjective:   Jessica Farrell is a 78 y.o. female who presents for a Medicare Annual Wellness Visit.  Allergies (verified) Penicillins   History: Past Medical History:  Diagnosis Date   Asthma    Cellulitis March  2014   Left foot and ankle   colon ca dx'd 01/2014   Colon polyps    2015    Diabetes mellitus (HCC) 05/05/2012   Type II   Hyperlipidemia    Morbid obesity (HCC)    Thyroid  disease 1990's   HypoThyroidism   Past Surgical History:  Procedure Laterality Date   CHOLECYSTECTOMY N/A 05/02/2017   Procedure: LAPAROSCOPIC CHOLECYSTECTOMY WITH INTRAOPERATIVE CHOLANGIOGRAM, VENTRAL HERNIA REPAIR;  Surgeon: Curvin Deward MOULD, MD;  Location: [facility];  Service: General;  Laterality: N/A;   COLON RESECTION N/A 02/03/2014   Procedure: LAPAROSCOPIC ASSISTED BOWEL RESECTION;  Surgeon: Krystal Russell, MD;  Location: [facility];  Service: General;  Laterality: N/A;   FRACTURE SURGERY  2000   Ankle   TOOTH EXTRACTION     wisdom Teeth   Family History  Problem Relation Age of Onset   Alzheimer's disease Father    Heart disease Mother    Arthritis Mother    Hypertension Mother    Alzheimer's disease Sister    Cancer Sister 43       breast cancer    Heart disease Brother        Heart Disease before age 62   Breast cancer Sister    Colon cancer Neg Hx    Esophageal cancer Neg Hx    Rectal cancer Neg Hx    Stomach cancer Neg Hx    Social History   Occupational History   Not on file  Tobacco Use   Smoking status: Never   Smokeless tobacco: Never  Vaping Use   Vaping status: Never Used  Substance and Sexual Activity   Alcohol use: No   Drug use: No   Sexual activity: Not Currently    Birth control/protection: Post-menopausal   Tobacco Counseling Counseling given: Not Answered  SDOH Screenings   Food Insecurity: Patient Declined (11/17/2023)  Housing: Low Risk  (11/17/2023)  Transportation Needs: No  Transportation Needs (11/17/2023)  Alcohol Screen: Low Risk  (08/01/2021)  Depression (PHQ2-9): Low Risk  (11/21/2023)  Financial Resource Strain: Patient Declined (11/17/2023)  Physical Activity: Unknown (11/17/2023)  Social Connections: Moderately Integrated (11/17/2023)  Stress: Patient Declined (11/17/2023)  Tobacco Use: Low Risk  (01/04/2024)   See flowsheets for full screening details  Depression Screen PHQ 2 & 9 Depression Scale- Over the past 2 weeks, how often have you been bothered by any of the following problems? Little interest or pleasure in doing things: 0 Feeling down, depressed, or hopeless (PHQ Adolescent also includes...irritable): 0 PHQ-2 Total Score: 0 Trouble falling or staying asleep, or sleeping too much: 0 Feeling tired or having little energy: 2 Poor appetite or overeating (PHQ Adolescent also includes...weight loss): 0 Feeling bad about yourself - or that you are a failure or have let yourself or your family down: 0 Trouble concentrating on things, such as reading the newspaper or watching television (PHQ Adolescent also includes...like school work): 0 Moving or speaking so slowly that other people could have noticed. Or the opposite - being so fidgety or restless that you have been moving around a lot more than usual: 0 Thoughts that you would be better off dead, or of hurting  yourself in some way: 0 PHQ-9 Total Score: 2 If you checked off any problems, how difficult have these problems made it for you to do your work, take care of things at home, or get along with other people?: Not difficult at all  Depression Treatment Depression Interventions/Treatment : EYV7-0 Score <4 Follow-up Not Indicated     Goals Addressed             This Visit's Progress    DIET - REDUCE SUGAR INTAKE   On track    Hoping for good news from the cancer doctor this week.       Visit info / Clinical Intake: Medicare Wellness Visit Type:: Subsequent Annual Wellness  Visit Persons participating in visit:: patient Medicare Wellness Visit Mode:: In-person (required for WTM) Information given by:: patient Interpreter Needed?: No Pre-visit prep was completed: yes AWV questionnaire completed by patient prior to visit?: no Living arrangements:: (!) lives alone Patient's Overall Health Status Rating: very good Typical amount of pain: none Does pain affect daily life?: no Are you currently prescribed opioids?: no  Dietary Habits and Nutritional Risks How many meals a day?: 2 Eats fruit and vegetables daily?: yes Most meals are obtained by: preparing own meals; eating out In the last 2 weeks, have you had any of the following?: (!) nausea, vomiting, diarrhea Diabetic:: (!) yes Any non-healing wounds?: no How often do you check your BS?: 1 Would you like to be referred to a Nutritionist or for Diabetic Management? : no  Functional Status Activities of Daily Living (to include ambulation/medication): Independent Ambulation: Independent with device- listed below Home Assistive Devices/Equipment: Eyeglasses Medication Administration: Independent Home Management: Independent Manage your own finances?: yes Primary transportation is: driving Concerns about vision?: no *vision screening is required for WTM*  Fall Screening Falls in the past year?: 0 Number of falls in past year: 0 Was there an injury with Fall?: 0 Fall Risk Category Calculator: 0 Patient Fall Risk Level: Low Fall Risk  Fall Risk Patient at Risk for Falls Due to: No Fall Risks Fall risk Follow up: Falls evaluation completed        Objective:    Today's Vitals   01/04/24 0815  Weight: 148 lb 9.6 oz (67.4 kg)  Height: 5' 4 (1.626 m)   Body mass index is 25.51 kg/m.  Current Medications (verified) Outpatient Encounter Medications as of 01/04/2024  Medication Sig   albuterol  (VENTOLIN  HFA) 108 (90 Base) MCG/ACT inhaler INHALE 2 PUFFS EVERY 6 HOURS AS NEEDED FOR WHEEZING OR  SHORTNESS OF BREATH   atorvastatin  (LIPITOR) 10 MG tablet TAKE 1 TABLET EVERY DAY   cetirizine  (ZYRTEC ) 10 MG tablet Take 1 tablet (10 mg total) by mouth daily.   fluticasone -salmeterol (ADVAIR  DISKUS) 500-50 MCG/ACT AEPB Inhale 1 puff into the lungs in the morning and at bedtime.   glucose blood test strip Based on insurance preference. Use as instructed once daily to check sugars. Dx E11.9   Lancets (ONETOUCH ULTRASOFT) lancets Use as instructed once daily to check sugars. Dx E11.9   levothyroxine  (SYNTHROID ) 100 MCG tablet Take 1 tablet (100 mcg total) by mouth daily.   loperamide (IMODIUM) 1 MG/5ML solution Take by mouth as needed for diarrhea or loose stools.   montelukast  (SINGULAIR ) 10 MG tablet TAKE 1 TABLET AT BEDTIME   naproxen  (NAPROSYN ) 375 MG tablet Take 1 tablet (375 mg total) by mouth 2 (two) times daily.   ondansetron  (ZOFRAN ) 8 MG tablet Take 1 tablet (8 mg total) by mouth  2 (two) times daily as needed for nausea or vomiting.   ondansetron  (ZOFRAN ) 8 MG tablet Take 1 tablet (8 mg total) by mouth 2 (two) times daily as needed for nausea or vomiting.   ondansetron  (ZOFRAN ) 8 MG tablet Take 1 tablet (8 mg total) by mouth 2 (two) times daily as needed for nausea or vomiting.   Prenatal Vit-Fe Fumarate-FA (PRENATAL VITAMINS PO) Take by mouth.   Semaglutide  (RYBELSUS ) 7 MG TABS Take 1 tablet (7 mg total) by mouth daily.   No facility-administered encounter medications on file as of 01/04/2024.   Hearing/Vision screen No results found. Immunizations and Health Maintenance Health Maintenance  Topic Date Due   COVID-19 Vaccine (1) Never done   FOOT EXAM  Never done   Zoster Vaccines- Shingrix (1 of 2) Never done   Medicare Annual Wellness (AWV)  08/02/2022   DEXA SCAN  11/20/2024 (Originally 03/25/2020)   Diabetic kidney evaluation - Urine ACR  05/21/2024   HEMOGLOBIN A1C  05/21/2024   OPHTHALMOLOGY EXAM  06/17/2024   Diabetic kidney evaluation - eGFR measurement  11/21/2024    DTaP/Tdap/Td (2 - Td or Tdap) 02/24/2029   Pneumococcal Vaccine: 50+ Years  Completed   Influenza Vaccine  Completed   Hepatitis C Screening  Completed   Meningococcal B Vaccine  Aged Out   Mammogram  Discontinued   Colonoscopy  Discontinued        Assessment/Plan:  This is a routine wellness examination for Atarah.  Patient Care Team: Geofm Glade PARAS, MD as PCP - General (Internal Medicine) Lonni Slain, MD as PCP - Cardiology (Cardiology) Lanny Callander, MD as Consulting Physician (Hematology) Lily Boas, MD as Consulting Physician (General Surgery) Szabat, Toribio BROCKS, [facility] (Inactive) as Pharmacist (Pharmacist) Maree Lonni Inks, MD as Consulting Physician (Ophthalmology)  I have personally reviewed and noted the following in the patient's chart:   Medical and social history Use of alcohol, tobacco or illicit drugs  Current medications and supplements including opioid prescriptions. Functional ability and status Nutritional status Physical activity Advanced directives List of other physicians Hospitalizations, surgeries, and ER visits in previous 12 months Vitals Screenings to include cognitive, depression, and falls Referrals and appointments  No orders of the defined types were placed in this encounter.  In addition, I have reviewed and discussed with patient certain preventive protocols, quality metrics, and best practice recommendations. A written personalized care plan for preventive services as well as general preventive health recommendations were provided to patient.   Shaida Route L Mishael Krysiak, CMA   01/04/2024   No follow-ups on file.  After Visit Summary: (MyChart) Due to this being a telephonic visit, the after visit summary with patients personalized plan was offered to patient via MyChart   Nurse Notes: Patient is due for a foot exam and will get that done during her next office visit.  She is also due for a Shingrix vaccine and would like to  discuss with Dr. Geofm during her next office visit.  Patient had no other concerns to address today.

## 2024-01-04 NOTE — Patient Instructions (Addendum)
 Jessica Farrell,  Thank you for taking the time for your Medicare Wellness Visit. I appreciate your continued commitment to your health goals. Please review the care plan we discussed, and feel free to reach out if I can assist you further.  Please note that Annual Wellness Visits do not include a physical exam. Some assessments may be limited, especially if the visit was conducted virtually. If needed, we may recommend an in-person follow-up with your provider.  Ongoing Care Seeing your primary care provider every 3 to 6 months helps us  monitor your health and provide consistent, personalized care. Next office visit on 05/22/2023.  You are due for a foot exam and can get that done during your next office visit.  You are also due for a Shingles and can get that done at your local pharmacy.   Referrals If a referral was made during today's visit and you haven't received any updates within two weeks, please contact the referred provider directly to check on the status.  Recommended Screenings:  Health Maintenance  Topic Date Due   COVID-19 Vaccine (1) Never done   Complete foot exam   Never done   Zoster (Shingles) Vaccine (1 of 2) Never done   DEXA scan (bone density measurement)  11/20/2024*   Yearly kidney health urinalysis for diabetes  05/21/2024   Hemoglobin A1C  05/21/2024   Eye exam for diabetics  06/17/2024   Yearly kidney function blood test for diabetes  11/21/2024   Medicare Annual Wellness Visit  01/03/2025   DTaP/Tdap/Td vaccine (2 - Td or Tdap) 02/24/2029   Pneumococcal Vaccine for age over 48  Completed   Flu Shot  Completed   Hepatitis C Screening  Completed   Meningitis B Vaccine  Aged Out   Breast Cancer Screening  Discontinued   Colon Cancer Screening  Discontinued  *Topic was postponed. The date shown is not the original due date.       07/27/2022   12:22 PM  Advanced Directives  Does Patient Have a Medical Advance Directive? No    Vision: Annual vision  screenings are recommended for early detection of glaucoma, cataracts, and diabetic retinopathy. These exams can also reveal signs of chronic conditions such as diabetes and high blood pressure.  Dental: Annual dental screenings help detect early signs of oral cancer, gum disease, and other conditions linked to overall health, including heart disease and diabetes.  Please see the attached documents for additional preventive care recommendations.

## 2024-01-13 ENCOUNTER — Other Ambulatory Visit: Payer: Self-pay | Admitting: Internal Medicine

## 2024-02-12 ENCOUNTER — Other Ambulatory Visit: Payer: Self-pay | Admitting: Internal Medicine

## 2024-03-02 NOTE — Assessment & Plan Note (Signed)
 pT3 N1 M0, stage IIIB, lung, nodes and peritoneal metastasis 09/2019 -diagnosed 01/2014, s/p resection 02/03/14 showing 3 cm NET involving serosal tissue. Margins negative, but 5/25 positive nodes with extracapsular extension. -10/06/19 Dotatate PET showed metastatic disease to peritoneum, abdominal and mediastinal lymph nodes, and lungs. Indeterminate liver lesions. 10/29/19 peritoneal biopsy confirmed well-differentiated neuroendocrine tumor. -she took Sandostatin  injections monthly 11/06/19 - 01/05/20, discontinued due to hyperglycemia, fatigue, and an episode of syncope and fall right after the injection. -for symptom management, she took one dose lanreotide on 02/24/21. This improved her diarrhea, but she developed vision issues in her right eye, and stopped -restaging CT CAP 02/08/2022 showed stable to slightly increased pulmonary nodules and peritoneal mets; otherwise no new signs of metastatic disease. Will continue to monitor given poor tolerance of prior treatment. -we discussed alternative therapy with oral Afinitor if she has disease progression. will continue observation.  Given her overall low disease burden, mild symptoms, will plan to continue observation. -She was seen in the emergency room on July 26, 2022 for chest pain, CT chest with contrast was obtained during her visit, which showed negative PE, stable lung nodules, no other acute findings.  I personally reviewed and discussed with patient. -Restaging CT 09/20/22 showed mild progression of pulmonary and peritoneal metastasis, stable RP nodes. Plan to continue monitoring -due to disease progression on CT in 02/2023, confirmed by dotatate PET scan in February 2025, patient started Lutathera  treatment on May 03, 2023 and completed 11/13/2023 -repeated Dotatate PET 11/22/23 showed mild improvement

## 2024-03-03 ENCOUNTER — Encounter: Payer: Self-pay | Admitting: Hematology

## 2024-03-03 ENCOUNTER — Inpatient Hospital Stay: Attending: Hematology

## 2024-03-03 ENCOUNTER — Inpatient Hospital Stay: Admitting: Hematology

## 2024-03-03 VITALS — BP 149/79 | HR 69 | Temp 97.7°F | Resp 17 | Wt 150.8 lb

## 2024-03-03 DIAGNOSIS — C7B8 Other secondary neuroendocrine tumors: Secondary | ICD-10-CM

## 2024-03-03 LAB — CMP (CANCER CENTER ONLY)
ALT: 19 U/L (ref 0–44)
AST: 24 U/L (ref 15–41)
Albumin: 3.9 g/dL (ref 3.5–5.0)
Alkaline Phosphatase: 115 U/L (ref 38–126)
Anion gap: 8 (ref 5–15)
BUN: 12 mg/dL (ref 8–23)
CO2: 28 mmol/L (ref 22–32)
Calcium: 9.3 mg/dL (ref 8.9–10.3)
Chloride: 105 mmol/L (ref 98–111)
Creatinine: 0.93 mg/dL (ref 0.44–1.00)
GFR, Estimated: >60 mL/min
Glucose, Bld: 109 mg/dL — ABNORMAL HIGH (ref 70–99)
Potassium: 4.7 mmol/L (ref 3.5–5.1)
Sodium: 141 mmol/L (ref 135–145)
Total Bilirubin: 0.4 mg/dL (ref 0.0–1.2)
Total Protein: 7.1 g/dL (ref 6.5–8.1)

## 2024-03-03 LAB — CBC WITH DIFFERENTIAL (CANCER CENTER ONLY)
Abs Immature Granulocytes: 0.01 K/uL (ref 0.00–0.07)
Basophils Absolute: 0 K/uL (ref 0.0–0.1)
Basophils Relative: 1 %
Eosinophils Absolute: 0.1 K/uL (ref 0.0–0.5)
Eosinophils Relative: 3 %
HCT: 31.6 % — ABNORMAL LOW (ref 36.0–46.0)
Hemoglobin: 10.7 g/dL — ABNORMAL LOW (ref 12.0–15.0)
Immature Granulocytes: 0 %
Lymphocytes Relative: 14 %
Lymphs Abs: 0.5 K/uL — ABNORMAL LOW (ref 0.7–4.0)
MCH: 32.9 pg (ref 26.0–34.0)
MCHC: 33.9 g/dL (ref 30.0–36.0)
MCV: 97.2 fL (ref 80.0–100.0)
Monocytes Absolute: 0.3 K/uL (ref 0.1–1.0)
Monocytes Relative: 9 %
Neutro Abs: 2.6 K/uL (ref 1.7–7.7)
Neutrophils Relative %: 73 %
Platelet Count: 88 K/uL — ABNORMAL LOW (ref 150–400)
RBC: 3.25 MIL/uL — ABNORMAL LOW (ref 3.87–5.11)
RDW: 12.7 % (ref 11.5–15.5)
WBC Count: 3.6 K/uL — ABNORMAL LOW (ref 4.0–10.5)
nRBC: 0 % (ref 0.0–0.2)

## 2024-03-03 NOTE — Progress Notes (Signed)
 " [facility]   [phone] Fax:([phone]) [phone]   Clinic Follow up Note   Patient Care Team: Geofm Glade PARAS, MD as PCP - General (Internal Medicine) Lonni Slain, MD as PCP - Cardiology (Cardiology) Lanny Callander, MD as Consulting Physician (Hematology) Lily Boas, MD as Consulting Physician (General Surgery) Szabat, Toribio BROCKS, [facility] (Inactive) as Pharmacist (Pharmacist) Maree Lonni Inks, MD as Consulting Physician (Ophthalmology)  Date of Service:  03/03/2024  CHIEF COMPLAINT: f/u of metastatic neuroendocrine tumor  CURRENT THERAPY:  Observation  Oncology History   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 (HCC) pT3 N1 M0, stage IIIB, lung, nodes and peritoneal metastasis 09/2019 -diagnosed 01/2014, s/p resection 02/03/14 showing 3 cm NET involving serosal tissue. Margins negative, but 5/25 positive nodes with extracapsular extension. -10/06/19 Dotatate PET showed metastatic disease to peritoneum, abdominal and mediastinal lymph nodes, and lungs. Indeterminate liver lesions. 10/29/19 peritoneal biopsy confirmed well-differentiated neuroendocrine tumor. -she took Sandostatin  injections monthly 11/06/19 - 01/05/20, discontinued due to hyperglycemia, fatigue, and an episode of syncope and fall right after the injection. -for symptom management, she took one dose lanreotide on 02/24/21. This improved her diarrhea, but she developed vision issues in her right eye, and stopped -restaging CT CAP 02/08/2022 showed stable to slightly increased pulmonary nodules and peritoneal mets; otherwise no new signs of metastatic disease. Will continue to monitor given poor tolerance of prior treatment. -we discussed alternative therapy with oral Afinitor if she has disease progression. will continue observation.  Given her overall low disease burden, mild symptoms, will plan to continue observation. -She was seen in the emergency room on July 26, 2022 for  chest pain, CT chest with contrast was obtained during her visit, which showed negative PE, stable lung nodules, no other acute findings.  I personally reviewed and discussed with patient. -Restaging CT 09/20/22 showed mild progression of pulmonary and peritoneal metastasis, stable RP nodes. Plan to continue monitoring -due to disease progression on CT in 02/2023, confirmed by dotatate PET scan in February 2025, patient started Lutathera  treatment on May 03, 2023 and completed 11/13/2023 -repeated Dotatate PET 11/22/23 showed mild improvement   Assessment & Plan Metastatic neuroendocrine tumor Metastatic neuroendocrine tumor previously treated with Lutathera , last administered approximately four months ago. She experiences intermittent fatigue and weakness similar to prior post-treatment symptoms, resolving within several days. She reported passage of a bright white, round tissue with streaks of blood in her stool, without pain or other acute symptoms; this was assessed as likely normal mucosa, given the appearance and her tumor's extraintestinal location. She continues to have morning diarrhea, though overall bowel movements have improved. Disease currently appears active, with ongoing symptoms.  - Scheduled follow-up in three months. - Ordered CT scan to be performed one week prior to next visit. - Ordered laboratory tests to be performed one week prior to next visit. - Instructed her to bring urine sample for testing at next visit. - Provided anticipatory guidance regarding symptom monitoring and disease stability.  Plan - Continue observation, follow-up in 3 months with lab and CT scan 1 week before   SUMMARY OF ONCOLOGIC HISTORY: Oncology History Overview Note  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   Staging form: Colon and Rectum, AJCC 7th Edition     Clinical: T3, N2, M0 - Unsigned     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 (HCC)  01/21/2014 Imaging    CT: Distal small bowel obstruction due to 3.4 cm soft tissue mass near the ileocecal valve,  with adjacent right lower quadrant mesenteric lymphadenopathy   02/03/2014 Pathologic Stage   invasive well diff neuroendocrine tumor (carcinoid), 3cm, pT3pN2 (5/25 nodes positive). negative margines.   02/03/2014 Surgery   Laparoscopic-assisted right colectomy and resection of distal ileum   02/03/2014 Initial Diagnosis   Metastatic malignant neuroendocrine tumor of the ileocecal valve to regional lymph nodes.    05/31/2015 Imaging   CT A/P w contrast  IMPRESSION: 1. No evidence of metastatic disease. 2. Question mild basilar subpleural pulmonary fibrosis.   09/11/2019 Imaging   CT AP W contrast    IMPRESSION: 1. Redemonstrated postoperative findings of ileocolectomy and reanastomosis. 2. There are prominent lymph nodes in the right mesocolon which are slightly increased in size compared to prior examination, the largest node measuring 1.8 x 0.7 cm, previously 1.5 x 0.4 cm. Findings are nonspecific although concerning for nodal metastatic disease. 3. There are additional new small nodules of the transverse mesocolon or omentum measuring up to 9 mm, likewise concerning for nodal or omental metastatic disease. 4. Redemonstrated broad-based midline, fat containing ventral hernia components. 5. Fluid in the endometrial cavity, abnormal in the late postmenopausal setting although unchanged compared to prior examination. 6. Status post interval cholecystectomy. 7. Aortic Atherosclerosis (ICD10-I70.0).   10/06/2019 PET scan   IMPRESSION: 1. Several small nodules in the peritoneal space and retroperitoneal space with intense radiotracer activity consistent with metastatic well differentiated neuroendocrine tumor. 2. Several small liver lesions above background activity are indeterminate. Consider MRI liver with and without contrast. 3. Radiotracer activity within mediastinal lymph nodes  and bilateral pulmonary nodules consistent with metastatic well-differentiated neuroendocrine tumor to the lungs. Activity is very low within these lesions but measurable. 4. No clear evidence skeletal metastasis. Single lesion in the posterior LEFT SI joint warrants attention on follow-up. 5. Although there are multiple sites of metastatic disease, the overall tumor burden of metastatic disease very small.   10/28/2019 Relapse/Recurrence   FINAL MICROSCOPIC DIAGNOSIS:   A. PERITONEAL NODULES, BIOPSY:  -  Well-differentiated neuroendocrine tumor (carcinoid)  -  See comment   COMMENT:   By immunohistochemistry, the neoplastic cells are positive for CD56,  chromogranin and synaptophysin with a low proliferative rate by Ki-67  (less than 1%).  These findings are consistent with metastasis of the  patient's previously diagnosed small bowel carcinoid.  These findings  were discussed with Dr. Wilmer on October 30, 2019.    11/06/2019 -  Chemotherapy   First-line Monthly Sandostatin  injection starting 11/06/19. Held since 01/05/20 due to poor toleration ( hypoglycemia, fatigue, and the episode of syncope and fall right after the injection. The diarrhea and flushing has, but overall symptoms are mild)    05/03/2020 Imaging   CT CAP  IMPRESSION: 1. Stable exam. No new or progressive interval findings. 2. Scattered tiny lung nodules are stable since PET-CT of 10/06/2019. These were hypermetabolic on that exam consistent with metastatic disease. 3. Multiple small omental and mesenteric soft tissue nodules are similar to prior. These were also noted to be hypermetabolic on the PET study. 4. Status post ileocolectomy. 5. Left colonic diverticulosis without diverticulitis. 6. Aortic Atherosclerosis (ICD10-I70.0).     02/08/2021 Imaging   EXAM: CT CHEST, ABDOMEN, AND PELVIS WITH CONTRAST  IMPRESSION: 1. Three of the pulmonary nodules have intervally enlarged, suggesting mild progression  of malignancy. The remaining pulmonary nodules in the omental nodules appear stable, and no definite new nodule is identified. 2. Other imaging findings of potential clinical significance: Aortic Atherosclerosis (ICD10-I70.0). Coronary atherosclerosis. Systemic atherosclerosis.  Complex ventral supraumbilical hernia with multiple defects and lobulations. Lumbar spondylosis and degenerative disc disease   07/28/2021 Imaging   EXAM: CT CHEST, ABDOMEN, AND PELVIS WITH CONTRAST  IMPRESSION: 1. Slight increased size of several pulmonary nodules, as detailed above, concerning for mild progression of metastatic disease to the lungs. In addition, there are findings in the left lower lobe which could be of infectious or inflammatory etiology, although the possibility of developing lymphangitic spread of tumor in the left lower lobe is not excluded. Further clinical evaluation and attention on follow-up studies is recommended. 2. Multiple small soft tissue human plants in the peritoneal cavity appear very similar to the prior examination. No other new signs of metastatic disease noted elsewhere in the abdomen or pelvis. 3. Aortic atherosclerosis, in addition to left anterior descending coronary artery disease. Assessment for potential risk factor modification, dietary therapy or pharmacologic therapy may be warranted, if clinically indicated. 4. Colonic diverticulosis without evidence of acute diverticulitis at this time. 5. Additional incidental findings, similar to prior study, as above.      Discussed the use of AI scribe software for clinical note transcription with the patient, who gave verbal consent to proceed.  History of Present Illness Jessica Farrell is a 79 year old female with metastatic neuroendocrine tumor who presents for routine oncology follow-up to assess disease status and ongoing symptoms.  She reports a recent passage of a perfectly round, bright white object in  her stool, about the size of a fingertip, with small streaks of blood on its surface. She had no pain, did not retain a sample, and denies abdominal pain or cramping.  She has intermittent episodes of marked fatigue and weakness similar to prior post-treatment symptoms. Each lasts two to three days and fully resolves between episodes. One episode occurred before passing the white stool object.  She completed her last Lutathera  treatment about four months ago and is not on active therapy. Bowel habits have improved. She now has a regular morning bowel movement followed by diarrhea, typically twice daily.     All other systems were reviewed with the patient and are negative.  MEDICAL HISTORY:  Past Medical History:  Diagnosis Date   Asthma    Cellulitis March  2014   Left foot and ankle   colon ca dx'd 01/2014   Colon polyps    2015    Diabetes mellitus (HCC) 05/05/2012   Type II   Hyperlipidemia    Morbid obesity (HCC)    Thyroid  disease 1990's   HypoThyroidism    SURGICAL HISTORY: Past Surgical History:  Procedure Laterality Date   CHOLECYSTECTOMY N/A 05/02/2017   Procedure: LAPAROSCOPIC CHOLECYSTECTOMY WITH INTRAOPERATIVE CHOLANGIOGRAM, VENTRAL HERNIA REPAIR;  Surgeon: Curvin Deward MOULD, MD;  Location: [facility];  Service: General;  Laterality: N/A;   COLON RESECTION N/A 02/03/2014   Procedure: LAPAROSCOPIC ASSISTED BOWEL RESECTION;  Surgeon: Krystal Russell, MD;  Location: [facility];  Service: General;  Laterality: N/A;   FRACTURE SURGERY  2000   Ankle   TOOTH EXTRACTION     wisdom Teeth    I have reviewed the social history and family history with the patient and they are unchanged from previous note.  ALLERGIES:  is allergic to penicillins.  MEDICATIONS:  Current Outpatient Medications  Medication Sig Dispense Refill   albuterol  (VENTOLIN  HFA) 108 (90 Base) MCG/ACT inhaler INHALE 2 PUFFS EVERY 6 HOURS AS NEEDED FOR WHEEZING OR SHORTNESS OF BREATH 54 g 3   atorvastatin   (LIPITOR) 10 MG tablet TAKE  1 TABLET EVERY DAY 90 tablet 3   cetirizine  (ZYRTEC ) 10 MG tablet Take 1 tablet (10 mg total) by mouth daily. 15 tablet 0   fluticasone -salmeterol (ADVAIR DISKUS) 500-50 MCG/ACT AEPB Inhale 1 puff into the lungs in the morning and at bedtime. 180 each 3   glucose blood test strip Based on insurance preference. Use as instructed once daily to check sugars. Dx E11.9 100 each 12   Lancets (ONETOUCH ULTRASOFT) lancets Use as instructed once daily to check sugars. Dx E11.9 100 each 12   levothyroxine  (SYNTHROID ) 100 MCG tablet Take 1 tablet (100 mcg total) by mouth daily. 90 tablet 3   loperamide (IMODIUM) 1 MG/5ML solution Take by mouth as needed for diarrhea or loose stools.     montelukast  (SINGULAIR ) 10 MG tablet TAKE 1 TABLET AT BEDTIME 90 tablet 3   naproxen  (NAPROSYN ) 375 MG tablet Take 1 tablet (375 mg total) by mouth 2 (two) times daily. 20 tablet 0   ondansetron  (ZOFRAN ) 8 MG tablet Take 1 tablet (8 mg total) by mouth 2 (two) times daily as needed for nausea or vomiting. 20 tablet 0   ondansetron  (ZOFRAN ) 8 MG tablet Take 1 tablet (8 mg total) by mouth 2 (two) times daily as needed for nausea or vomiting. 20 tablet 0   ondansetron  (ZOFRAN ) 8 MG tablet Take 1 tablet (8 mg total) by mouth 2 (two) times daily as needed for nausea or vomiting. 20 tablet 0   Prenatal Vit-Fe Fumarate-FA (PRENATAL VITAMINS PO) Take by mouth.     Semaglutide  (RYBELSUS ) 7 MG TABS Take 1 tablet by mouth once daily 30 tablet 4   No current facility-administered medications for this visit.    PHYSICAL EXAMINATION: ECOG PERFORMANCE STATUS: 1 - Symptomatic but completely ambulatory  Vitals:   03/03/24 0826 03/03/24 0827  BP: (!) 149/75 (!) 149/79  Pulse: 69   Resp: 17   Temp: 97.7 F (36.5 C)   SpO2: 99%    Wt Readings from Last 3 Encounters:  03/03/24 150 lb 12.8 oz (68.4 kg)  01/04/24 148 lb 9.6 oz (67.4 kg)  11/29/23 157 lb 12.8 oz (71.6 kg)     GENERAL:alert, no distress and  comfortable SKIN: skin color, texture, turgor are normal, no rashes or significant lesions EYES: normal, Conjunctiva are pink and non-injected, sclera clear NECK: supple, thyroid  normal size, non-tender, without nodularity LYMPH:  no palpable lymphadenopathy in the cervical, axillary  LUNGS: clear to auscultation and percussion with normal breathing effort HEART: regular rate & rhythm and no murmurs and no lower extremity edema ABDOMEN:abdomen soft, non-tender and normal bowel sounds Musculoskeletal:no cyanosis of digits and no clubbing  NEURO: alert & oriented x 3 with fluent speech, no focal motor/sensory deficits  Physical Exam    LABORATORY DATA:  I have reviewed the data as listed    Latest Ref Rng & Units 03/03/2024    7:53 AM 11/22/2023    8:22 AM 11/13/2023    8:30 AM  CBC  WBC 4.0 - 10.5 K/uL 3.6  4.3  3.9   Hemoglobin 12.0 - 15.0 g/dL 89.2  89.0  89.5   Hematocrit 36.0 - 46.0 % 31.6  31.8  32.1   Platelets 150 - 400 K/uL 88  160  144         Latest Ref Rng & Units 03/03/2024    7:53 AM 11/22/2023    8:22 AM 11/13/2023    8:30 AM  CMP  Glucose 70 - 99 mg/dL 890  166  143   BUN 8 - 23 mg/dL 12  15  17    Creatinine 0.44 - 1.00 mg/dL 9.06  9.10  9.09   Sodium 135 - 145 mmol/L 141  142  140   Potassium 3.5 - 5.1 mmol/L 4.7  3.5  3.8   Chloride 98 - 111 mmol/L 105  108  105   CO2 22 - 32 mmol/L 28  28  23    Calcium  8.9 - 10.3 mg/dL 9.3  9.2  9.2   Total Protein 6.5 - 8.1 g/dL 7.1  7.0  6.6   Total Bilirubin 0.0 - 1.2 mg/dL 0.4  0.3  0.4   Alkaline Phos 38 - 126 U/L 115  98  118   AST 15 - 41 U/L 24  16  21    ALT 0 - 44 U/L 19  13  18        RADIOGRAPHIC STUDIES: I have personally reviewed the radiological images as listed and agreed with the findings in the report. No results found.    Orders Placed This Encounter  Procedures   CT CHEST ABDOMEN PELVIS W CONTRAST    Standing Status:   Future    Expected Date:   05/26/2024    Expiration Date:   03/03/2025    If  indicated for the ordered procedure, I authorize the administration of contrast media per Radiology protocol:   Yes    Does the patient have a contrast media/X-ray dye allergy?:   No    Preferred imaging location?:   [facility]    If indicated for the ordered procedure, I authorize the administration of oral contrast media per Radiology protocol:   Yes   All questions were answered. The patient knows to call the clinic with any problems, questions or concerns. No barriers to learning was detected. The total time spent in the appointment was 25 minutes, including review of chart and various tests results, discussions about plan of care and coordination of care plan     Onita Mattock, MD 03/03/2024     "

## 2024-03-07 ENCOUNTER — Other Ambulatory Visit: Payer: Self-pay

## 2024-03-07 DIAGNOSIS — C7B8 Other secondary neuroendocrine tumors: Secondary | ICD-10-CM

## 2024-03-12 LAB — 5 HIAA, QUANTITATIVE, URINE, 24 HOUR
5-HIAA, Ur: 16.2 mg/L
5-HIAA,Quant.,24 Hr Urine: 22.7 mg/(24.h) — ABNORMAL HIGH (ref 0.0–14.9)
Total Volume: 1400

## 2024-05-21 ENCOUNTER — Ambulatory Visit: Admitting: Internal Medicine

## 2024-06-02 ENCOUNTER — Inpatient Hospital Stay

## 2024-06-02 ENCOUNTER — Inpatient Hospital Stay: Admitting: Hematology
